# Patient Record
Sex: Male | Born: 2000 | Race: Black or African American | Hispanic: No | Marital: Single | Smoking: Never smoker
Health system: Southern US, Community
[De-identification: ages and names within clinical notes are randomized; demographics above are authoritative.]

## PROBLEM LIST (undated history)

## (undated) DIAGNOSIS — D5700 Hb-SS disease with crisis, unspecified: Secondary | ICD-10-CM

## (undated) DIAGNOSIS — J45909 Unspecified asthma, uncomplicated: Secondary | ICD-10-CM

## (undated) DIAGNOSIS — M87052 Idiopathic aseptic necrosis of left femur: Secondary | ICD-10-CM

## (undated) DIAGNOSIS — D571 Sickle-cell disease without crisis: Secondary | ICD-10-CM

## (undated) DIAGNOSIS — D5701 Hb-SS disease with acute chest syndrome: Secondary | ICD-10-CM

## (undated) HISTORY — DX: Hb-SS disease with crisis, unspecified: D57.00

## (undated) HISTORY — PX: UMBILICAL HERNIA REPAIR: SHX196

## (undated) HISTORY — PX: SPLENECTOMY: SUR1306

## (undated) HISTORY — PX: TONSILLECTOMY AND ADENOIDECTOMY: SHX28

## (undated) HISTORY — PX: ADENOIDECTOMY: SUR15

## (undated) HISTORY — PX: TONSILLECTOMY: SUR1361

---
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
## 2018-02-05 ENCOUNTER — Encounter: Payer: Self-pay | Admitting: Family Medicine

## 2018-02-05 ENCOUNTER — Ambulatory Visit (INDEPENDENT_AMBULATORY_CARE_PROVIDER_SITE_OTHER): Payer: Medicaid Other | Admitting: Family Medicine

## 2018-02-05 VITALS — BP 132/74 | HR 83 | Temp 98.9°F | Resp 16 | Ht 66.0 in | Wt 144.0 lb

## 2018-02-05 DIAGNOSIS — D571 Sickle-cell disease without crisis: Secondary | ICD-10-CM

## 2018-02-05 LAB — POCT URINALYSIS DIPSTICK
BILIRUBIN UA: NEGATIVE
GLUCOSE UA: NEGATIVE
Ketones, UA: NEGATIVE
Leukocytes, UA: NEGATIVE
Nitrite, UA: NEGATIVE
Protein, UA: NEGATIVE
RBC UA: NEGATIVE
SPEC GRAV UA: 1.02 (ref 1.010–1.025)
Urobilinogen, UA: 1 E.U./dL
pH, UA: 6.5 (ref 5.0–8.0)

## 2018-02-05 MED ORDER — PENICILLIN V POTASSIUM 250 MG PO TABS: 250.0000 mg | ORAL_TABLET | Freq: Two times a day (BID) | ORAL | 0 refills | Status: DC

## 2018-02-05 NOTE — Addendum Note (Signed)
Addended by: Loney HeringBATTEN, Kaiyah Eber E on: 02/05/2018 02:23 PM   Modules accepted: Orders

## 2018-02-05 NOTE — Progress Notes (Signed)
Chief Complaint  Patient presents with  . Sickle Cell Anemia     Subjective:    Patient ID: Jonathan [name], male    DOB: 10/31/2000, 17 y.o.   MRN: [id number]  HPI Jonathan [name], a very pleasant 17 year old male with a history of sickle cell anemia, HbSS presents for a follow up.    Patient reports infrequent sickle cell crisis. He is followed by Dr. Jacinta Shoe, [facility]. Patient consistently takes hydoxyurea 3244 mg daily, folic acid, and penicillin.  Patient says that he primarily uses Ibuprofen for pain associated with sickle cell anemia. However, he takes Percocet 5-325 mg occasionally for moderate to severe pain.  He denies headache, pain, fatigue, chest pain, heart palpitations, nausea, vomiting, diarrhea, or dysuria.   Past Medical History:  Diagnosis Date  . Acute chest syndrome(517.3) 2013   march 2013, Sept 2013  . Asthma   . Avascular necrosis of bone of left hip ([place])   . Hb-SS disease with vaso-occlusive crisis ([place]) 07/28/2011  . Sickle cell disease with crisis ([place])   . Sickle cell disease, type SS ([place])   . Sickle cell pain crisis ([place]) 10/02/2012  . Vaso-occlusive sickle cell crisis ([place]) 11/09/2011   Social History   Socioeconomic History  . Marital status: Single    Spouse name: Not on file  . Number of children: Not on file  . Years of education: Not on file  . Highest education level: Not on file  Occupational History  . Not on file  Social Needs  . Financial resource strain: Not on file  . Food insecurity:    Worry: Not on file    Inability: Not on file  . Transportation needs:    Medical: Not on file    Non-medical: Not on file  Tobacco Use  . Smoking status: Never Smoker  . Smokeless tobacco: Never Used  Substance and Sexual Activity  . Alcohol use: No    Alcohol/week: 0.0 oz  . Drug use: No  . Sexual activity: Never  Lifestyle  . Physical activity:    Days per week: Not on file    Minutes per session: Not on file  . Stress: Not on  file  Relationships  . Social connections:    Talks on phone: Not on file    Gets together: Not on file    Attends religious service: Not on file    Active member of club or organization: Not on file    Attends meetings of clubs or organizations: Not on file    Relationship status: Not on file  . Intimate partner violence:    Fear of current or ex partner: Not on file    Emotionally abused: Not on file    Physically abused: Not on file    Forced sexual activity: Not on file  Other Topics Concern  . Not on file  Social History Narrative   Lives with mother.  No smokers at home      Update:       05/02/16 Lives with Mother only   Immunization History  Administered Date(s) Administered  . DTaP 09/02/1999, 10/29/1999, 01/01/2000, 02/02/2001, 07/25/2003  . H1N1 06/14/2008  . Hepatitis A 03/10/2006, 04/20/2007  . Hepatitis B November [birth year] 1998, 09/02/1999, 01/01/2000  . HiB (PRP-OMP) 09/02/1999, 10/29/1999, 01/01/2000, 02/02/2001  . IPV 09/02/1999, 10/29/1999, 07/14/2000, 07/25/2003  . Influenza Split 07/14/2000, 06/23/2001, 06/14/2003, 07/02/2004, 07/14/2007, 06/14/2008, 06/06/2009, 06/06/2010, 06/18/2011, 05/24/2012  . Influenza,inj,Quad PF,6+ Mos 08/06/2013, 05/18/2015, 06/17/2017  . MMR 07/14/2000,  07/25/2003  . Meningococcal Conjugate 05/04/2001, 02/11/2006, 08/28/2010, 03/25/2016  . Pneumococcal Conjugate-13 09/02/1999, 10/29/1999, 01/01/2000, 09/16/2000, 06/06/2009  . Pneumococcal Polysaccharide-23 04/21/2001, 10/23/2004, 09/24/2005, 07/15/2011  . Tdap 05/16/2011  . Varicella 07/14/2000, 03/10/2006   Past Surgical History:  Procedure Laterality Date  . ADENOIDECTOMY    . SPLENECTOMY    . TONSILLECTOMY    . TONSILLECTOMY AND ADENOIDECTOMY    . TOTAL HIP ARTHROPLASTY Left 03/07/2016  . UMBILICAL HERNIA REPAIR     No Known Allergies   Past Medical History:  Diagnosis Date  . Acute chest syndrome(517.3) 2013   march 2013, Sept 2013  . Asthma   . Avascular necrosis of bone  of left hip ([place])   . Hb-SS disease with vaso-occlusive crisis ([place]) 07/28/2011  . Sickle cell disease with crisis ([place])   . Sickle cell disease, type SS ([place])   . Sickle cell pain crisis ([place]) 10/02/2012  . Vaso-occlusive sickle cell crisis ([place]) 11/09/2011   Social History   Socioeconomic History  . Marital status: Single    Spouse name: Not on file  . Number of children: Not on file  . Years of education: Not on file  . Highest education level: Not on file  Occupational History  . Not on file  Social Needs  . Financial resource strain: Not on file  . Food insecurity:    Worry: Not on file    Inability: Not on file  . Transportation needs:    Medical: Not on file    Non-medical: Not on file  Tobacco Use  . Smoking status: Never Smoker  . Smokeless tobacco: Never Used  Substance and Sexual Activity  . Alcohol use: No    Alcohol/week: 0.0 oz  . Drug use: No  . Sexual activity: Never  Lifestyle  . Physical activity:    Days per week: Not on file    Minutes per session: Not on file  . Stress: Not on file  Relationships  . Social connections:    Talks on phone: Not on file    Gets together: Not on file    Attends religious service: Not on file    Active member of club or organization: Not on file    Attends meetings of clubs or organizations: Not on file    Relationship status: Not on file  . Intimate partner violence:    Fear of current or ex partner: Not on file    Emotionally abused: Not on file    Physically abused: Not on file    Forced sexual activity: Not on file  Other Topics Concern  . Not on file  Social History Narrative   Lives with mother.  No smokers at home      Update:       05/02/16 Lives with Mother only   Review of Systems  Constitutional: Negative.  Negative for fatigue, fever and unexpected weight change.  HENT: Negative.   Eyes: Negative.   Cardiovascular: Negative.   Gastrointestinal: Negative.   Endocrine: Negative.  Negative for  polydipsia, polyphagia and polyuria.  Genitourinary: Negative.   Musculoskeletal: Positive for back pain.       Lower extremities  Skin: Negative.   Allergic/Immunologic: Negative.   Neurological: Negative.   Hematological: Negative.   Psychiatric/Behavioral: Negative.        Objective:   Physical Exam  Constitutional: He is oriented to person, place, and time. He appears well-developed and well-nourished.  HENT:  Head: Normocephalic and atraumatic.  Right Ear: External  ear normal.  Left Ear: External ear normal.  Nose: Nose normal.  Mouth/Throat: Oropharynx is clear and moist.  Eyes: Pupils are equal, round, and reactive to light.  Neck: Normal range of motion. Neck supple.  Cardiovascular: Normal rate, regular rhythm, normal heart sounds and intact distal pulses.  Pulmonary/Chest: Effort normal and breath sounds normal.  Abdominal: Soft. Bowel sounds are normal.  Musculoskeletal: Normal range of motion.  Neurological: He is alert and oriented to person, place, and time. He has normal reflexes.  Skin: Skin is warm and dry.  Psychiatric: He has a normal mood and affect. His behavior is normal. Judgment and thought content normal.      Assessment & Plan:  Sickle cell disease, type SS (HCC) Continue Hydrea at current dosage. We discussed the need for good hydration, monitoring of hydration status, avoidance of heat, cold, stress, and infection triggers. We discussed the risks and benefits of Hydrea, including bone marrow suppression, the possibility of GI upset, skin ulcers, hair thinning, and teratogenicity. The patient was reminded of the need to seek medical attention of any symptoms of bleeding, anemia, or infection. Continue folic acid 1 mg daily to prevent aplastic bone marrow crises.   Pulmonary evaluation - Patient denies severe recurrent wheezes, shortness of breath with exercise, or persistent cough. If these symptoms develop, pulmonary function tests with spirometry will  be ordered, and if abnormal, plan on referral to Pulmonology for further evaluation.  Cardiac - Routine screening for pulmonary hypertension is not recommended.  Eye - High risk of proliferative retinopathy. Annual eye exam with retinal exam recommended to patient. Eye exam up to date.   Immunization status - Vaccinations up to date  Acute and chronic painful episodes - Will continue Percocet 5-325 mg every 6 hours for moderate to severe pain. Pt is also aware that the prescription history is available to [place] online through the [facility] CSRS. Controlled substance agreement signed 12/10/2017. We reminded Jonathan [name] that all patients receiving Schedule II narcotics must be seen for follow within one month of prescription being requested. We reviewed the terms of our pain agreement, including the need to keep medicines in a safe locked location away from children or pets, and the need to report excess sedation or constipation, measures to avoid constipation, and policies related to early refills and stolen prescriptions. According to the [place] Chronic Pain Initiative program, we have reviewed details related to analgesia, adverse effects, aberrant behaviors. Reviewed [place] Substance Reporting system prior to prescribing opiate medications. No inconsistencies noted.  - penicillin v potassium (VEETID) 250 MG tablet; Take 1 tablet (250 mg total) by mouth 2 (two) times daily.  Dispense: 60 tablet; Refill: 0 - CBC with Differential - Comprehensive metabolic panel - Reticulocytes   RTC: 3 months for chronic conditions   [name]  MSN, FNP-C [facility] [address] [place], [place] [address] [phone]

## 2018-02-05 NOTE — Patient Instructions (Signed)
We discussed in detail that hydroxyurea has been found to be effective in reducing VOC, transfusions, and admissions for pain. It also reduces the duration of pain episodes by at least 50%. It depends on compliance. Compliance can be assessed by a rise in [facility] and drop in WBC/retic/ANC, and platelet count. It typically causes a rise in hemoglobin F, which can be assessed via hemoglobinopathy. It takes 3-6 months to see optimal effects of hydroxyurea therapy.    Discussed the importance of drinking 64 ounces of water daily. The Importance of Water. To help prevent pain crises, it is important to drink plenty of water throughout the day. This is because dehydration of red blood cells may lead to the sickling process.      Sickle Cell Anemia, Adult Sickle cell anemia is a condition where your red blood cells are shaped like sickles. Red blood cells carry oxygen through the body. Sickle-shaped red blood cells do not live as long as normal red blood cells. They also clump together and block blood from flowing through the blood vessels. These things prevent the body from getting enough oxygen. Sickle cell anemia causes organ damage and pain. It also increases the risk of infection. Follow these instructions at home:  Drink enough fluid to keep your pee (urine) clear or pale yellow. Drink more in hot weather and during exercise.  Do not smoke. Smoking lowers oxygen levels in the blood.  Only take over-the-counter or prescription medicines as told by your doctor.  Take antibiotic medicines as told by your doctor. Make sure you finish them even if you start to feel better.  Take supplements as told by your doctor.  Consider wearing a medical alert bracelet. This tells anyone caring for you in an emergency of your condition.  When traveling, keep your medical information, doctors' names, and the medicines you take with you at all times.  If you have a fever, do not take fever medicines right away. This  could cover up a problem. Tell your doctor.  Keep all follow-up visits with your doctor. Sickle cell anemia requires regular medical care. Contact a doctor if: You have a fever. Get help right away if:  You feel dizzy or faint.  You have new belly (abdominal) pain, especially on the left side near the stomach area.  You have a lasting, often uncomfortable and painful erection of the penis (priapism). If it is not treated right away, you will become unable to have sex (impotence).  You have numbness in your arms or legs or you have a hard time moving them.  You have a hard time talking.  You have a fever or lasting symptoms for more than 2-3 days.  You have a fever and your symptoms suddenly get worse.  You have signs or symptoms of infection. These include: ? Chills. ? Being more tired than normal (lethargy). ? Irritability. ? Poor eating. ? Throwing up (vomiting).  You have pain that is not helped with medicine.  You have shortness of breath.  You have pain in your chest.  You are coughing up pus-like or bloody mucus.  You have a stiff neck.  Your feet or hands swell or have pain.  Your belly looks bloated.  Your joints hurt. This information is not intended to replace advice given to you by your health care provider. Make sure you discuss any questions you have with your health care provider. Document Released: 06/01/2013 Document Revised: 01/17/2016 Document Reviewed: 03/23/2013 Elsevier Interactive Patient Education  2017 Elsevier Inc.  

## 2018-02-06 LAB — COMPREHENSIVE METABOLIC PANEL
A/G RATIO: 2 (ref 1.2–2.2)
ALBUMIN: 4.6 g/dL (ref 3.5–5.5)
ALT: 7 IU/L (ref 0–44)
AST: 15 IU/L (ref 0–40)
Alkaline Phosphatase: 124 IU/L (ref 56–127)
BILIRUBIN TOTAL: 0.5 mg/dL (ref 0.0–1.2)
BUN / CREAT RATIO: 8 — AB (ref 9–20)
BUN: 6 mg/dL (ref 6–20)
CHLORIDE: 106 mmol/L (ref 96–106)
CO2: 22 mmol/L (ref 20–29)
Calcium: 9.3 mg/dL (ref 8.7–10.2)
Creatinine, Ser: 0.71 mg/dL — ABNORMAL LOW (ref 0.76–1.27)
GFR calc Af Amer: 158 mL/min/{1.73_m2} (ref >59)
GFR calc non Af Amer: 137 mL/min/{1.73_m2} (ref >59)
GLOBULIN, TOTAL: 2.3 g/dL (ref 1.5–4.5)
Glucose: 100 mg/dL — ABNORMAL HIGH (ref 65–99)
POTASSIUM: 4.1 mmol/L (ref 3.5–5.2)
SODIUM: 141 mmol/L (ref 134–144)
Total Protein: 6.9 g/dL (ref 6.0–8.5)

## 2018-02-06 LAB — CBC WITH DIFFERENTIAL/PLATELET
BASOS: 1 %
Basophils Absolute: 0.1 10*3/uL (ref 0.0–0.2)
EOS (ABSOLUTE): 0.1 10*3/uL (ref 0.0–0.4)
EOS: 1 %
HEMATOCRIT: 30.4 % — AB (ref 37.5–51.0)
HEMOGLOBIN: 9.9 g/dL — AB (ref 13.0–17.7)
IMMATURE GRANULOCYTES: 0 %
Immature Grans (Abs): 0 10*3/uL (ref 0.0–0.1)
LYMPHS ABS: 3 10*3/uL (ref 0.7–3.1)
Lymphs: 70 %
MCH: 36.9 pg — AB (ref 26.6–33.0)
MCHC: 32.6 g/dL (ref 31.5–35.7)
MCV: 113 fL — ABNORMAL HIGH (ref 79–97)
MONOS ABS: 0.4 10*3/uL (ref 0.1–0.9)
Monocytes: 10 %
NEUTROS PCT: 18 %
Neutrophils Absolute: 0.8 10*3/uL — ABNORMAL LOW (ref 1.4–7.0)
Platelets: 310 10*3/uL (ref 150–450)
RBC: 2.68 x10E6/uL — CL (ref 4.14–5.80)
RDW: 25.1 % — AB (ref 12.3–15.4)
WBC: 4.3 10*3/uL (ref 3.4–10.8)

## 2018-02-06 LAB — RETICULOCYTES: RETIC CT PCT: 3.6 % — AB (ref 0.6–2.6)

## 2018-02-08 ENCOUNTER — Telehealth: Payer: Self-pay

## 2018-02-08 NOTE — Telephone Encounter (Signed)
-----   Message from Massie MaroonLachina M Hollis, [place] sent at 02/08/2018  5:35 AM EDT ----- Regarding: lab results Please inform patient that all labs are consistent with baseline. Advise patient to follow up in office as scheduled '  Lachina Rennis PettyMoore Hollis  MSN, FNP-C Patient Care [facility] [address]  [place], [place] [address] [phone]

## 2018-02-08 NOTE — Telephone Encounter (Signed)
Called, no answer. Left a message advising that all labs are consistent with baseline and to keep next scheduled follow up in office. Asked if any questions to call back to our office. Thanks!

## 2018-06-18 ENCOUNTER — Telehealth: Payer: Self-pay

## 2018-06-24 ENCOUNTER — Other Ambulatory Visit: Payer: Self-pay | Admitting: Internal Medicine

## 2018-06-24 DIAGNOSIS — Z79891 Long term (current) use of opiate analgesic: Secondary | ICD-10-CM

## 2018-06-24 MED ORDER — OXYCODONE-ACETAMINOPHEN 5-325 MG PO TABS: 1.0000 | ORAL_TABLET | Freq: Four times a day (QID) | ORAL | 0 refills | Status: DC | PRN

## 2018-06-24 NOTE — Telephone Encounter (Signed)
Prescription sent to the pharmacy.

## 2018-07-28 ENCOUNTER — Ambulatory Visit (INDEPENDENT_AMBULATORY_CARE_PROVIDER_SITE_OTHER): Payer: Self-pay | Admitting: Family Medicine

## 2018-07-28 ENCOUNTER — Encounter: Payer: Self-pay | Admitting: Family Medicine

## 2018-07-28 VITALS — BP 135/77 | HR 64 | Temp 98.1°F | Resp 14 | Ht 66.0 in | Wt 154.0 lb

## 2018-07-28 DIAGNOSIS — J029 Acute pharyngitis, unspecified: Secondary | ICD-10-CM

## 2018-07-28 DIAGNOSIS — Z79891 Long term (current) use of opiate analgesic: Secondary | ICD-10-CM

## 2018-07-28 LAB — POCT RAPID STREP A (OFFICE): Rapid Strep A Screen: NEGATIVE

## 2018-07-28 MED ORDER — LIDOCAINE VISCOUS HCL 2 % MT SOLN: 15.0000 mL | OROMUCOSAL | 0 refills | Status: DC | PRN

## 2018-07-28 MED ORDER — METHYLPREDNISOLONE SODIUM SUCC 125 MG IJ SOLR
125.0000 mg | Freq: Once | INTRAMUSCULAR | Status: AC
  Administered 2018-07-28: 125 mg via INTRAMUSCULAR

## 2018-07-28 MED ORDER — OXYCODONE-ACETAMINOPHEN 5-325 MG PO TABS: 1.0000 | ORAL_TABLET | Freq: Four times a day (QID) | ORAL | 0 refills | Status: AC | PRN

## 2018-07-28 NOTE — Patient Instructions (Addendum)
I sent a liquid medication to the pharmacy to help with your sore throat. Gargle and swallow as the directions state on the bottle.  Warm salt water gargles will help too.    Pharyngitis Pharyngitis is a sore throat (pharynx). There is redness, pain, and swelling of your throat. Follow these instructions at home:  Drink enough fluids to keep your pee (urine) clear or pale yellow.  Only take medicine as told by your doctor. ? You may get sick again if you do not take medicine as told. Finish your medicines, even if you start to feel better. ? Do not take aspirin.  Rest.  Rinse your mouth (gargle) with salt water ( tsp of salt per 1 qt of water) every 1-2 hours. This will help the pain.  If you are not at risk for choking, you can suck on hard candy or sore throat lozenges. Contact a doctor if:  You have large, tender lumps on your neck.  You have a rash.  You cough up green, yellow-brown, or bloody spit. Get help right away if:  You have a stiff neck.  You drool or cannot swallow liquids.  You throw up (vomit) or are not able to keep medicine or liquids down.  You have very bad pain that does not go away with medicine.  You have problems breathing (not from a stuffy nose). This information is not intended to replace advice given to you by your health care provider. Make sure you discuss any questions you have with your health care provider. Document Released: 01/28/2008 Document Revised: 01/17/2016 Document Reviewed: 04/18/2013 Elsevier Interactive Patient Education  2017 [employer]

## 2018-07-28 NOTE — Progress Notes (Signed)
  Patient Care Center Internal Medicine and Sickle Cell Care   Progress Note: Sick Visit Provider: Mike GipAndre Antoine Vandermeulen, FNP  SUBJECTIVE:   Jonathan [name] is a 17 y.o. male who  has a past medical history of Acute chest syndrome(517.3) (2013), Asthma, Avascular necrosis of bone of left hip (HCC), Hb-SS disease with vaso-occlusive crisis (HCC) (07/28/2011), Sickle cell disease with crisis (HCC), Sickle cell disease, type SS (HCC), Sickle cell pain crisis (HCC) (10/02/2012), and Vaso-occlusive sickle cell crisis (HCC) (11/09/2011).. Patient presents today for Sore Throat and Headache  . ROS   OBJECTIVE: BP 135/77 (BP Location: Left Arm, Patient Position: Sitting, Cuff Size: Normal)   Pulse 64   Temp 98.1 F (36.7 C) (Oral)   Resp 14   Ht 5\' 6"  (1.676 m)   Wt 154 lb (69.9 kg)   SpO2 100%   BMI 24.86 kg/m   Wt Readings from Last 3 Encounters:  07/28/18 154 lb (69.9 kg) (52 %, Z= 0.05)*  02/05/18 144 lb (65.3 kg) (38 %, Z= -0.29)*  12/10/17 141 lb (64 kg) (34 %, Z= -0.40)*   * Growth percentiles are based on CDC (Boys, 2-20 Years) data.     Physical Exam  ASSESSMENT/PLAN:  Pharyngitis, unspecified etiology Rapid strep negative. Mono pending. Will treat symptoms.  - Mono (Epstein Barr Virus) - methylPREDNISolone sodium succinate (SOLU-MEDROL) 125 mg/2 mL injection 125 mg - lidocaine (XYLOCAINE) 2 % solution; Use as directed 15 mLs in the mouth or throat every 4 (four) hours as needed (sore throat). Gargle and swallow  Dispense: 100 mL; Refill: 0 - CBC with Differential - Comprehensive metabolic panel - Mononucleosis screen  3. Chronic prescription opiate use - oxyCODONE-acetaminophen (PERCOCET) 5-325 MG tablet; Take 1 tablet by mouth every 6 (six) hours as needed for up to 15 days for severe pain.  Dispense: 60 tablet; Refill: 0        The patient was given clear instructions to go to ER or return to medical center if symptoms do not improve, worsen or new problems develop. The  patient verbalized understanding and agreed with plan of care.   Ms. Freda Jacksonndr L. Riley Lamouglas, FNP-BC Patient Care Center [facility] [address]  [place], [place] [address] [phone]     This note has been created with Dragon speech recognition software and smart phrase technology. Any transcriptional errors are unintentional.

## 2018-07-29 LAB — CBC WITH DIFFERENTIAL/PLATELET
Basophils Absolute: 0.1 10*3/uL (ref 0.0–0.2)
Basos: 1 %
EOS (ABSOLUTE): 0.1 10*3/uL (ref 0.0–0.4)
Eos: 2 %
Hematocrit: 34.8 % — ABNORMAL LOW (ref 37.5–51.0)
Hemoglobin: 11.8 g/dL — ABNORMAL LOW (ref 13.0–17.7)
Immature Grans (Abs): 0 10*3/uL (ref 0.0–0.1)
Immature Granulocytes: 0 %
Lymphocytes Absolute: 2.8 10*3/uL (ref 0.7–3.1)
Lymphs: 54 %
MCH: 36.5 pg — ABNORMAL HIGH (ref 26.6–33.0)
MCHC: 33.9 g/dL (ref 31.5–35.7)
MCV: 108 fL — ABNORMAL HIGH (ref 79–97)
Monocytes Absolute: 0.5 10*3/uL (ref 0.1–0.9)
Monocytes: 9 %
NRBC: 1 % — ABNORMAL HIGH (ref 0–0)
Neutrophils Absolute: 1.7 10*3/uL (ref 1.4–7.0)
Neutrophils: 34 %
Platelets: 316 10*3/uL (ref 150–450)
RBC: 3.23 x10E6/uL — ABNORMAL LOW (ref 4.14–5.80)
RDW: 14.9 % (ref 12.3–15.4)
WBC: 5.1 10*3/uL (ref 3.4–10.8)

## 2018-07-29 LAB — COMPREHENSIVE METABOLIC PANEL
ALT: 10 IU/L (ref 0–44)
AST: 15 IU/L (ref 0–40)
Albumin/Globulin Ratio: 1.7 (ref 1.2–2.2)
Albumin: 4.5 g/dL (ref 3.5–5.5)
Alkaline Phosphatase: 108 IU/L (ref 39–117)
BUN/Creatinine Ratio: 6 — ABNORMAL LOW (ref 9–20)
BUN: 5 mg/dL — ABNORMAL LOW (ref 6–20)
Bilirubin Total: 0.7 mg/dL (ref 0.0–1.2)
CO2: 22 mmol/L (ref 20–29)
Calcium: 9.6 mg/dL (ref 8.7–10.2)
Chloride: 101 mmol/L (ref 96–106)
Creatinine, Ser: 0.79 mg/dL (ref 0.76–1.27)
GFR calc Af Amer: 150 mL/min/{1.73_m2} (ref >59)
GFR calc non Af Amer: 130 mL/min/{1.73_m2} (ref >59)
Globulin, Total: 2.7 g/dL (ref 1.5–4.5)
Glucose: 92 mg/dL (ref 65–99)
Potassium: 4.4 mmol/L (ref 3.5–5.2)
Sodium: 140 mmol/L (ref 134–144)
Total Protein: 7.2 g/dL (ref 6.0–8.5)

## 2018-07-30 ENCOUNTER — Telehealth: Payer: Self-pay

## 2018-07-30 NOTE — Telephone Encounter (Signed)
Called, no answer. No voicemail picked up. We are still waiting on results for mono. Thanks!

## 2018-08-09 ENCOUNTER — Ambulatory Visit (INDEPENDENT_AMBULATORY_CARE_PROVIDER_SITE_OTHER): Payer: Self-pay | Admitting: Family Medicine

## 2018-08-09 ENCOUNTER — Encounter: Payer: Self-pay | Admitting: Family Medicine

## 2018-08-09 VITALS — BP 133/78 | HR 84 | Temp 98.3°F | Resp 16 | Ht 66.0 in | Wt 153.0 lb

## 2018-08-09 DIAGNOSIS — D571 Sickle-cell disease without crisis: Secondary | ICD-10-CM

## 2018-08-09 LAB — POCT URINALYSIS DIPSTICK
Bilirubin, UA: NEGATIVE
Blood, UA: NEGATIVE
Glucose, UA: NEGATIVE
Ketones, UA: NEGATIVE
Leukocytes, UA: NEGATIVE
Nitrite, UA: NEGATIVE
Protein, UA: POSITIVE — AB
Spec Grav, UA: 1.015 (ref 1.010–1.025)
Urobilinogen, UA: 0.2 E.U./dL
pH, UA: 5.5 (ref 5.0–8.0)

## 2018-08-09 NOTE — Progress Notes (Signed)
PATIENT CARE CENTER INTERNAL MEDICINE AND SICKLE CELL CARE  SICKLE CELL ANEMIA FOLLOW UP VISIT PROVIDER: Mike Gip, FNP    Subjective:   Jonathan [name]  is a 17 y.o.  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 presents for a follow up for Sickle Cell Anemia. The patient has had 0 admissions in the past 6 months.  Pain regimen includes: Ibuprofen and percocet  Hydrea Therapy: Yes Medication compliance: Yes  Pain today is 0/10.  Patient reports drinking 60 ounces of fluid per day.  Patient doing well today. Pharyngitis has resolved. No complaints.  Followed by hematology.   Review of Systems  Constitutional: Negative.   HENT: Negative.   Eyes: Negative.   Respiratory: Negative.   Cardiovascular: Negative.   Gastrointestinal: Negative.   Genitourinary: Negative.   Musculoskeletal: Negative.   Skin: Negative.   Neurological: Negative.   Psychiatric/Behavioral: Negative.     Objective:   Objective  BP 133/78 (BP Location: Left Arm, Patient Position: Sitting, Cuff Size: Normal)   Pulse 84   Temp 98.3 F (36.8 C) (Oral)   Resp 16   Ht 5\' 6"  (1.676 m)   Wt 153 lb (69.4 kg)   SpO2 98%   BMI 24.69 kg/m   Wt Readings from Last 3 Encounters:  08/09/18 153 lb (69.4 kg) (50 %, Z= 0.01)*  07/28/18 154 lb (69.9 kg) (52 %, Z= 0.05)*  02/05/18 144 lb (65.3 kg) (38 %, Z= -0.29)*   * Growth percentiles are based on CDC (Boys, 2-20 Years) data.     Physical Exam Vitals signs and nursing note reviewed.  Constitutional:      General: He is not in acute distress.    Appearance: He is well-developed.  HENT:     Head: Normocephalic and atraumatic.  Eyes:   Conjunctiva/sclera: Conjunctivae normal.     Pupils: Pupils are equal, round, and reactive to light.  Neck:     Musculoskeletal: Normal range of motion.  Cardiovascular:     Rate and Rhythm: Normal rate and regular rhythm.     Heart sounds: Normal heart sounds.  Pulmonary:     Effort: Pulmonary effort is normal. No respiratory distress.     Breath sounds: Normal breath sounds.  Abdominal:     General: Bowel sounds are normal. There is no distension.     Palpations: Abdomen is soft.  Musculoskeletal: Normal range of motion.  Skin:    General: Skin is warm and dry.  Neurological:     Mental Status: He is alert and oriented to person, place, and time.  Psychiatric:        Behavior: Behavior normal.        Thought Content: Thought content normal.      Assessment/Plan:   Assessment   Encounter Diagnosis  Name Primary?  . Sickle cell disease, type SS (HCC) Yes     Plan  1. Sickle cell disease, type SS (HCC) Continue with current medications.  - Urinalysis Dipstick   Return to care as scheduled and prn. Patient verbalized understanding and agreed with plan of care.   1. Sickle cell disease - Continue Hydrea   We discussed the need for good hydration, monitoring of hydration status, avoidance of  heat, cold, stress, and infection triggers. We discussed the risks and benefits of Hydrea, including bone marrow suppression, the possibility of GI upset, skin ulcers, hair thinning, and teratogenicity. The patient was reminded of the need to seek medical attention of any symptoms of bleeding, anemia, or infection. Continue folic acid 1 mg daily to prevent aplastic bone marrow crises.   2. Pulmonary evaluation - Patient denies severe recurrent wheezes, shortness of breath with exercise, or persistent cough. If these symptoms develop, pulmonary function tests with spirometry will be ordered, and if abnormal, plan on referral to Pulmonology for further evaluation.  3. Cardiac - Routine  screening for pulmonary hypertension is not recommended.  4. Eye - High risk of proliferative retinopathy. Annual eye exam with retinal exam recommended to patient.  5. Immunization status -  Yearly influenza vaccination is recommended, as well as being up to date with Meningococcal and Pneumococcal vaccines.   6. Acute and chronic painful episodes - We discussed that pt is to receive Schedule II prescriptions only from us. Pt is also aware that the prescription history is available to us online through the [facility] CS[facility]. Controlled substance agreement signed. We reminded Jonathan [name] that all patients receiving Schedule II narcotics must be seen for follow within one month of prescription being requested. We reviewed the terms of our pain agreement, including the need to keep medicines in a safe locked location away from children or pets, and the need to report excess sedation or constipation, measures to avoid constipation, and policies related to early refills and stolen prescriptions. According to the [place] [facility] program, we have reviewed details related to analgesia, adverse effects, aberrant behaviors.  7. Iron overload from chronic transfusion.  Not applicable at this time.  If this occurs will use Exjade for management.   8. Vitamin D deficiency - Drisdol 50,000 units weekly. Patient encouraged to take as prescribed.   The above recommendations are taken from the NIH Evidence-Based Management of Sickle Cell Disease: Expert Panel Report, [id number].   [name], FNP-BC Patient [facility] [address]  [place], [place] [address] [phone]  This note has been created with Dragon speech recognition software and smart phrase technology. Any transcriptional errors are unintentional.

## 2018-08-09 NOTE — Patient Instructions (Signed)
Sickle Cell Anemia, Adult °Sickle cell anemia is a condition where your red blood cells are shaped like sickles. Red blood cells carry oxygen through the body. Sickle-shaped red blood cells do not live as long as normal red blood cells. They also clump together and block blood from flowing through the blood vessels. These things prevent the body from getting enough oxygen. Sickle cell anemia causes organ damage and pain. It also increases the risk of infection. °Follow these instructions at home: °· Drink enough fluid to keep your pee (urine) clear or pale yellow. Drink more in hot weather and during exercise. °· Do not smoke. Smoking lowers oxygen levels in the blood. °· Only take over-the-counter or prescription medicines as told by your doctor. °· Take antibiotic medicines as told by your doctor. Make sure you finish them even if you start to feel better. °· Take supplements as told by your doctor. °· Consider wearing a medical alert bracelet. This tells anyone caring for you in an emergency of your condition. °· When traveling, keep your medical information, doctors' names, and the medicines you take with you at all times. °· If you have a fever, do not take fever medicines right away. This could cover up a problem. Tell your doctor. °· Keep all follow-up visits with your doctor. Sickle cell anemia requires regular medical care. °Contact a doctor if: °You have a fever. °Get help right away if: °· You feel dizzy or faint. °· You have new belly (abdominal) pain, especially on the left side near the stomach area. °· You have a lasting, often uncomfortable and painful erection of the penis (priapism). If it is not treated right away, you will become unable to have sex (impotence). °· You have numbness in your arms or legs or you have a hard time moving them. °· You have a hard time talking. °· You have a fever or lasting symptoms for more than 2-3 days. °· You have a fever and your symptoms suddenly get  worse. °· You have signs or symptoms of infection. These include: °? Chills. °? Being more tired than normal (lethargy). °? Irritability. °? Poor eating. °? Throwing up (vomiting). °· You have pain that is not helped with medicine. °· You have shortness of breath. °· You have pain in your chest. °· You are coughing up pus-like or bloody mucus. °· You have a stiff neck. °· Your feet or hands swell or have pain. °· Your belly looks bloated. °· Your joints hurt. °This information is not intended to replace advice given to you by your health care provider. Make sure you discuss any questions you have with your health care provider. °Document Released: 06/01/2013 Document Revised: 01/17/2016 Document Reviewed: 03/23/2013 °Elsevier Interactive Patient Education © 2017 Elsevier Inc. ° °

## 2018-08-22 IMAGING — MR MR ORBITS W/O CM
9 of 12 series · 26 of 48 positions shown · IV contrast (agent unspecified)
Comparison: None.

CLINICAL DATA: Orbital pain and swelling. Severe clavicle pain.
Sickle cell crisis with known avascular necrosis, hypertension.

EXAM:
MRI HEAD AND ORBITS WITHOUT CONTRAST
TECHNIQUE: Multiplanar, multiecho pulse sequences of the brain and surrounding
structures were obtained without intravenous contrast. Multiplanar,
multiecho pulse sequences of the orbits and surrounding structures
were obtained including fat saturation techniques, without
intravenous contrast administration. Patient was unable to complete
examination and postcontrast imaging was not obtained.

[Series 2: FLAIR · sagittal · 5.0mm · 0.47mm/px · 3 of 23 slices shown (1 of 2)]
[im 1/23]
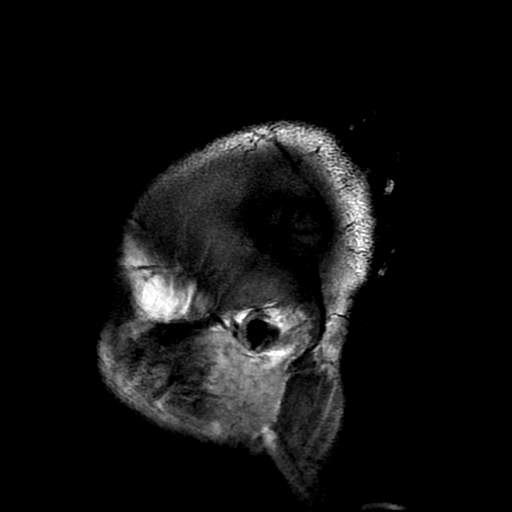
[im 12/23]
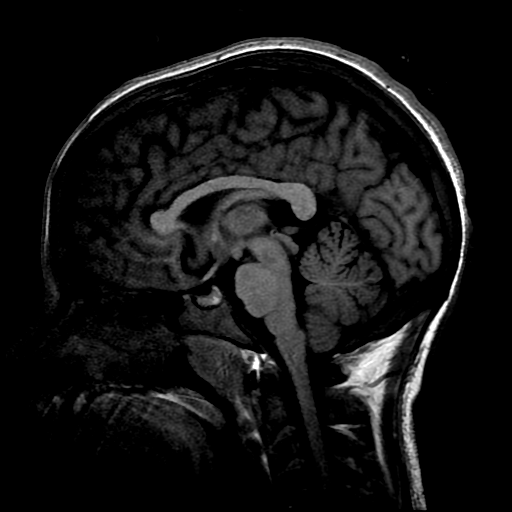
[im 23/23]
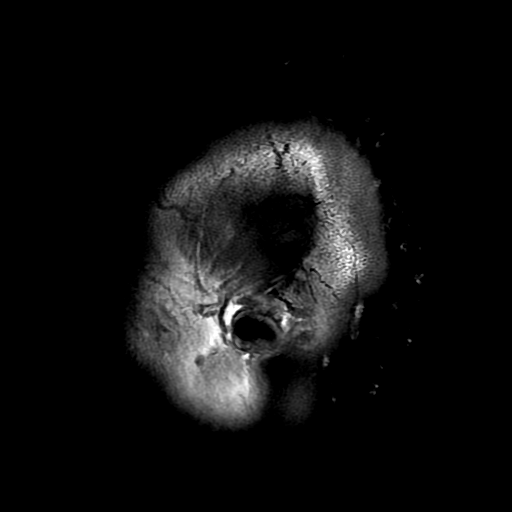

[Series 4: DWI · axial · 3.0mm · 0.94mm/px · z∈[-35,+108]mm · 8 of 99 slices shown]
[im 1/99]
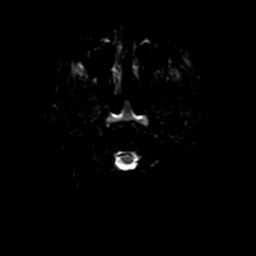
[im 15/99]
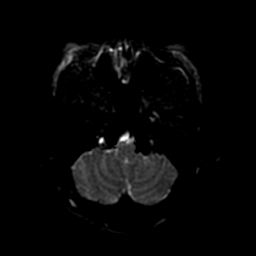
[im 29/99]
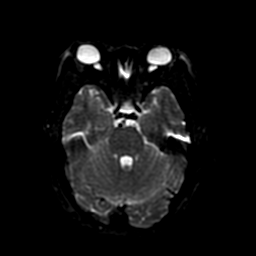
[im 43/99]
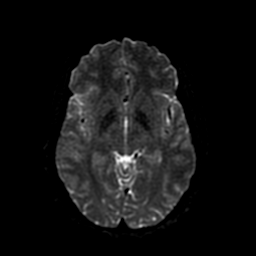
[im 57/99]
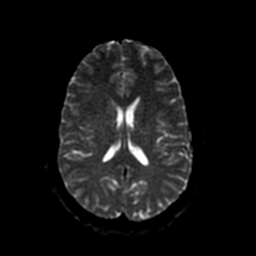
[im 71/99]
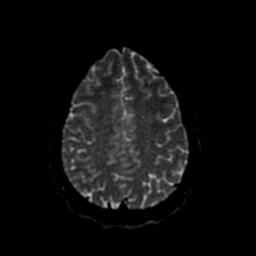
[im 85/99]
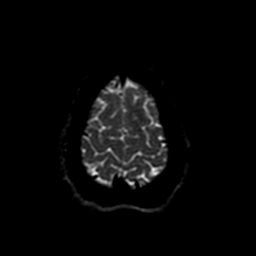
[im 99/99]
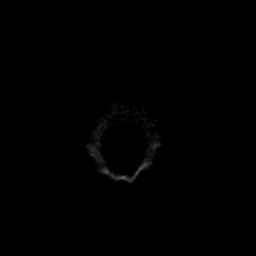

[Series 6: T2 · axial · 5.0mm · 0.47mm/px · z∈[-52,+109]mm · 2 of 29 slices shown]
[im 1/29]
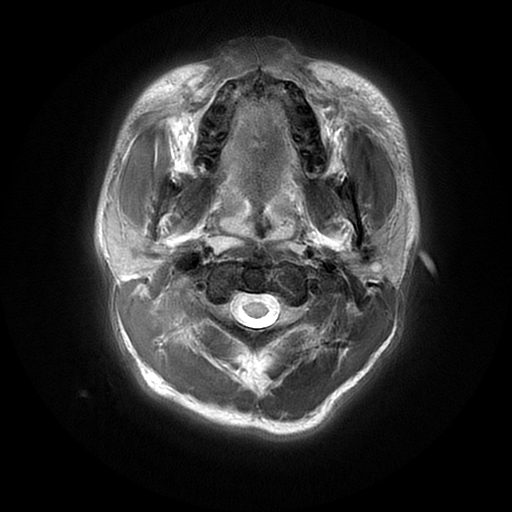
[im 29/29]
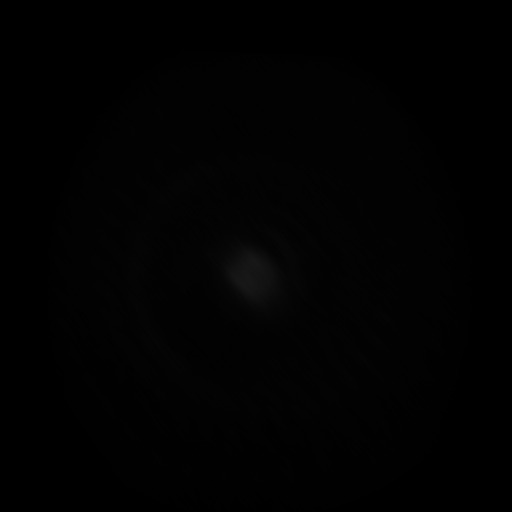

[Series 7: FLAIR · axial · 5.0mm · 0.47mm/px · z∈[-53,+109]mm · 2 of 27 slices shown (2 of 2)]
[im 1/27]
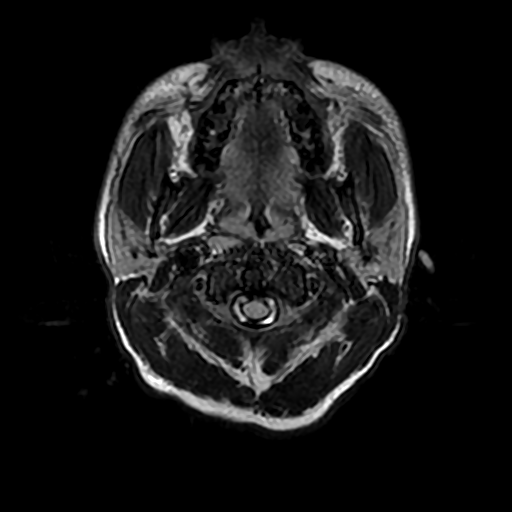
[im 27/27]
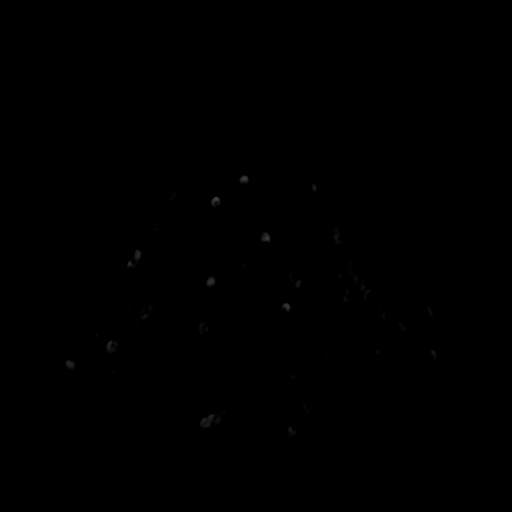

[Series 10: T2 fat-sat · coronal · 4.0mm · 0.35mm/px · 2 of 22 slices shown (1 of 2)]
[im 1/22]
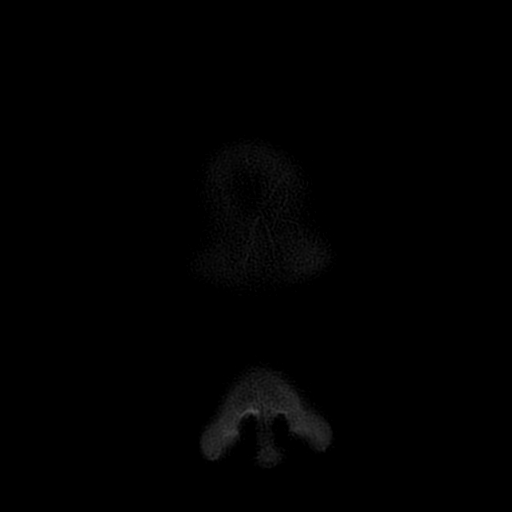
[im 22/22]
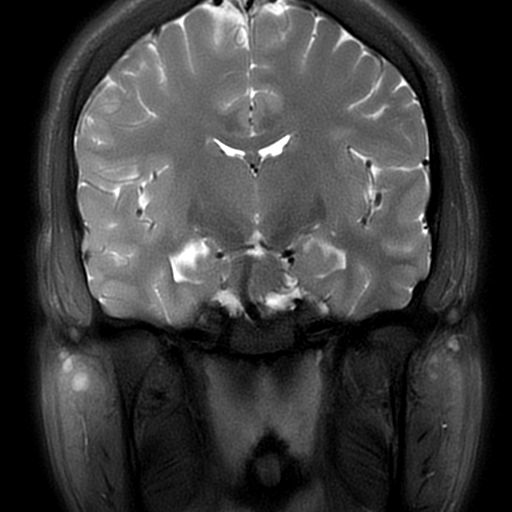

[Series 11: T2 fat-sat · axial · 3.0mm · 0.35mm/px · z∈[-24,+33]mm · 2 of 20 slices shown (2 of 2)]
[im 1/20]
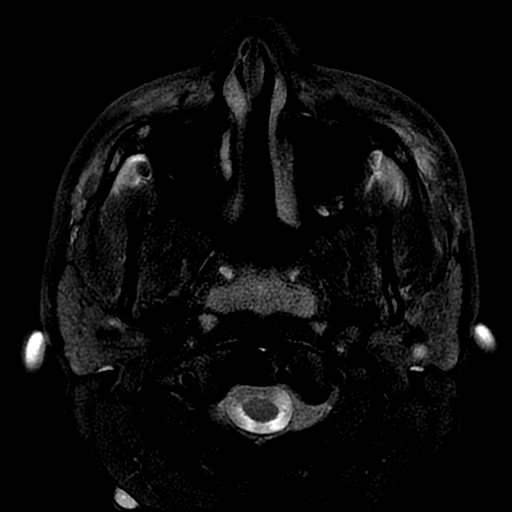
[im 20/20]
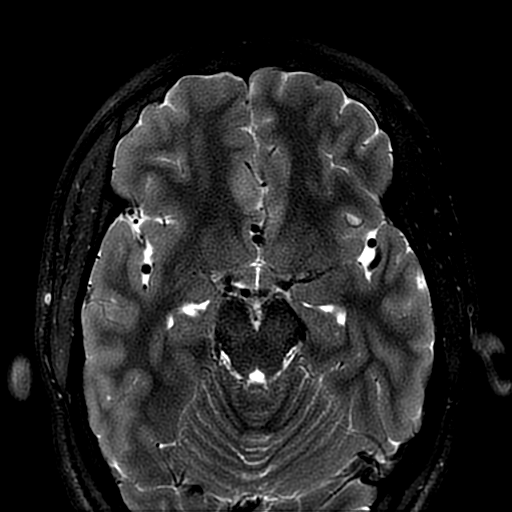

[Series 12: T1 · coronal · 4.0mm · 0.35mm/px · 2 of 22 slices shown (1 of 2)]
[im 1/22]
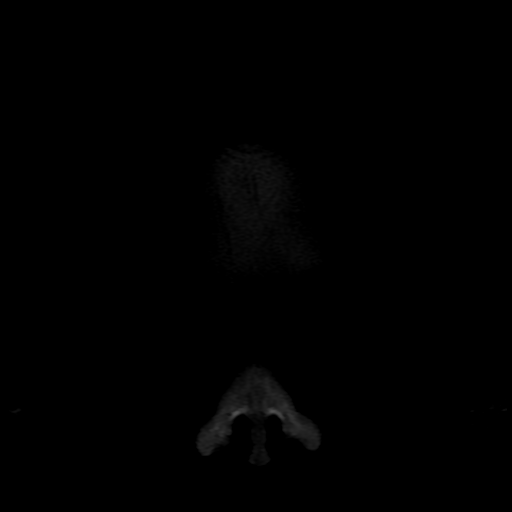
[im 22/22]
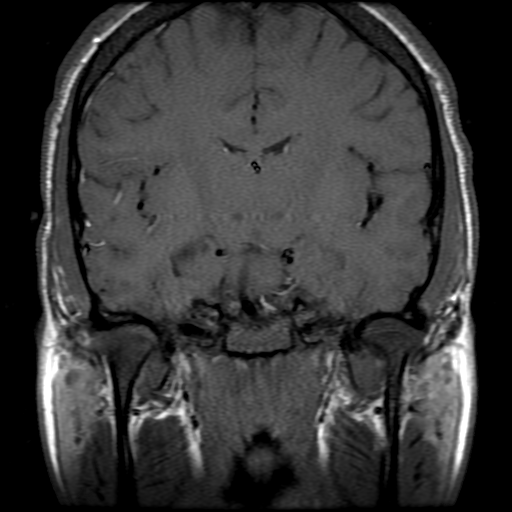

[Series 13: T1 · axial · 3.0mm · 0.35mm/px · 1 of 20 slices shown (2 of 2)]
[im 1/20]
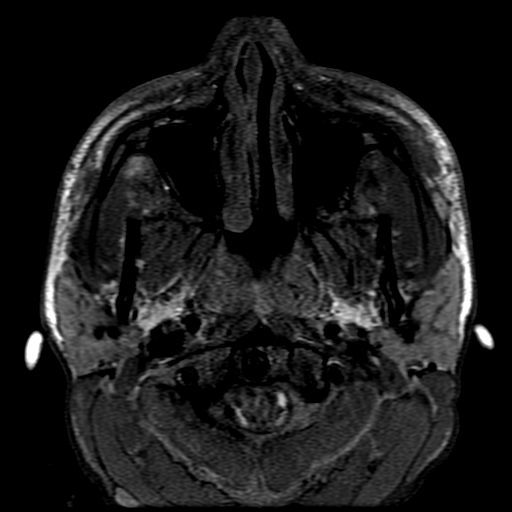

[Series 450: ADC · axial · 3.0mm · 0.94mm/px · z∈[-35,+108]mm · 4 of 50 slices shown]
[im 1/50]
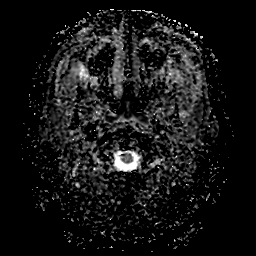
[im 17/50]
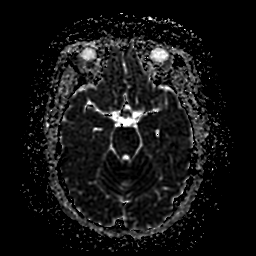
[im 33/50]
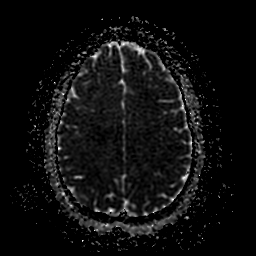
[im 50/50]
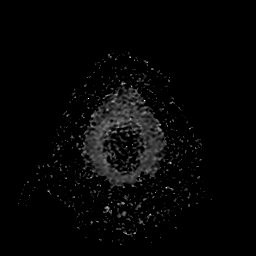

[26 of 48 positions shown; findings below may reference images not displayed]

FINDINGS: MRI HEAD FINDINGS

BRAIN: No reduced diffusion to suggest acute ischemia. No
susceptibility artifact to suggest hemorrhage. The ventricles and
sulci are normal for patient's age. LEFT frontal developmental
venous anomaly, benign finding. Subcentimeter faint susceptibility
artifact RIGHT caudate head with minimal FLAIR T2 hyperintense
signal. No suspicious parenchymal signal, masses or mass effect. No
abnormal extra-axial fluid collections.

VASCULAR: Normal major intracranial vascular flow voids present at
skull base.

SKULL AND UPPER CERVICAL SPINE: No abnormal sellar expansion. No
suspicious calvarial bone marrow signal. Craniocervical junction
maintained.

OTHER: None.

MRI ORBITS FINDINGS

ORBITS: Ocular globes are intact with normal signal. No flattening
of the optic discs. Lenses are located. Preservation of the orbital
fat. T2 bright signal within the bilateral anterior optic nerves,
orbital segment. Patulous optic nerve sheath. Slightly increased T2
view while I with probable T2 shine through, limited assessment due
to slice thickness. Normal symmetric appearance of the extraocular
muscles. No intra-ocular mass, signal abnormality nor abnormal
enhancement. Superior ophthalmic veins are not enlarged.

VISUALIZED SINUSES: Well-aerated.

SOFT TISSUES: Normal.
IMPRESSION: MRI HEAD:  No acute intracranial process.

Chronic faint microhemorrhage RIGHT basal ganglia.

MRI ORBITS: Abnormal bilateral optic nerve orbital segment with
patulous optic nerve sheaths. Given patient's history of sickle
cell, vasculitic involvement and ischemic optic neuropathy is
suspected, less likely inflammatory etiology ; no corroborative
findings of intracranial hypertension. Recommend postcontrast
imaging, thin slice coronal DWI sequence and ophthalmalgic
consultation.

These results will be called to the ordering clinician or
representative by the Radiologist Assistant, and communication
documented in the [employer] Dashboard.

## 2018-11-02 ENCOUNTER — Emergency Department (HOSPITAL_COMMUNITY)
Admission: EM | Admit: 2018-11-02 | Discharge: 2018-11-02 | Disposition: A | Discharge status: Home / Self Care | Payer: Medicaid Other | Attending: Emergency Medicine | Admitting: Emergency Medicine

## 2018-11-02 ENCOUNTER — Other Ambulatory Visit: Payer: Self-pay

## 2018-11-02 ENCOUNTER — Encounter (HOSPITAL_COMMUNITY): Payer: Self-pay | Admitting: Emergency Medicine

## 2018-11-02 DIAGNOSIS — J45909 Unspecified asthma, uncomplicated: Secondary | ICD-10-CM | POA: Insufficient documentation

## 2018-11-02 DIAGNOSIS — I10 Essential (primary) hypertension: Secondary | ICD-10-CM | POA: Insufficient documentation

## 2018-11-02 DIAGNOSIS — Z96642 Presence of left artificial hip joint: Secondary | ICD-10-CM | POA: Insufficient documentation

## 2018-11-02 DIAGNOSIS — Z79899 Other long term (current) drug therapy: Secondary | ICD-10-CM | POA: Insufficient documentation

## 2018-11-02 DIAGNOSIS — D5700 Hb-SS disease with crisis, unspecified: Secondary | ICD-10-CM

## 2018-11-02 LAB — CBC WITH DIFFERENTIAL/PLATELET
Abs Immature Granulocytes: 0.03 10*3/uL (ref 0.00–0.07)
Basophils Absolute: 0.1 10*3/uL (ref 0.0–0.1)
Basophils Relative: 1 %
EOS ABS: 0.3 10*3/uL (ref 0.0–0.5)
Eosinophils Relative: 2 %
HCT: 35.5 % — ABNORMAL LOW (ref 39.0–52.0)
Hemoglobin: 12.7 g/dL — ABNORMAL LOW (ref 13.0–17.0)
Immature Granulocytes: 0 %
Lymphocytes Relative: 28 %
Lymphs Abs: 3.7 10*3/uL (ref 0.7–4.0)
MCH: 34.1 pg — ABNORMAL HIGH (ref 26.0–34.0)
MCHC: 35.8 g/dL (ref 30.0–36.0)
MCV: 95.4 fL (ref 80.0–100.0)
Monocytes Absolute: 0.8 10*3/uL (ref 0.1–1.0)
Monocytes Relative: 6 %
NRBC: 0.3 % — AB (ref 0.0–0.2)
Neutro Abs: 8.3 10*3/uL — ABNORMAL HIGH (ref 1.7–7.7)
Neutrophils Relative %: 63 %
Platelets: 353 10*3/uL (ref 150–400)
RBC: 3.72 MIL/uL — ABNORMAL LOW (ref 4.22–5.81)
RDW: 16 % — ABNORMAL HIGH (ref 11.5–15.5)
WBC: 13.2 10*3/uL — ABNORMAL HIGH (ref 4.0–10.5)

## 2018-11-02 LAB — COMPREHENSIVE METABOLIC PANEL
ALT: 25 U/L (ref 0–44)
ANION GAP: 10 (ref 5–15)
AST: 33 U/L (ref 15–41)
Albumin: 5 g/dL (ref 3.5–5.0)
Alkaline Phosphatase: 98 U/L (ref 38–126)
BUN: 11 mg/dL (ref 6–20)
CALCIUM: 9.9 mg/dL (ref 8.9–10.3)
CO2: 24 mmol/L (ref 22–32)
CREATININE: 0.88 mg/dL (ref 0.61–1.24)
Chloride: 105 mmol/L (ref 98–111)
GFR calc Af Amer: >60 mL/min (ref >60)
GFR calc non Af Amer: >60 mL/min (ref >60)
Glucose, Bld: 100 mg/dL — ABNORMAL HIGH (ref 70–99)
Potassium: 4.3 mmol/L (ref 3.5–5.1)
Sodium: 139 mmol/L (ref 135–145)
Total Bilirubin: 1 mg/dL (ref 0.3–1.2)
Total Protein: 8.3 g/dL — ABNORMAL HIGH (ref 6.5–8.1)

## 2018-11-02 LAB — RETICULOCYTES
Immature Retic Fract: 39.8 % — ABNORMAL HIGH (ref 2.3–15.9)
RBC.: 3.72 MIL/uL — AB (ref 4.22–5.81)
Retic Count, Absolute: 97.1 10*3/uL (ref 19.0–186.0)
Retic Ct Pct: 2.6 % (ref 0.4–3.1)

## 2018-11-02 MED ORDER — DEXTROSE-NACL 5-0.45 % IV SOLN
INTRAVENOUS | Status: DC
  Administered 2018-11-02: 21:00:00 via INTRAVENOUS

## 2018-11-02 MED ORDER — MORPHINE SULFATE (PF) 4 MG/ML IV SOLN
6.0000 mg | INTRAVENOUS | Status: DC | PRN
  Administered 2018-11-02 (×2): 6 mg via INTRAVENOUS
  Filled 2018-11-02 (×2): qty 2

## 2018-11-02 MED ORDER — HYDROMORPHONE HCL 1 MG/ML IJ SOLN
0.5000 mg | Freq: Once | INTRAMUSCULAR | Status: AC
  Administered 2018-11-02: 0.5 mg via SUBCUTANEOUS
  Filled 2018-11-02: qty 1

## 2018-11-02 MED ORDER — ONDANSETRON 4 MG PO TBDP
4.0000 mg | ORAL_TABLET | Freq: Once | ORAL | Status: AC
  Administered 2018-11-02: 4 mg via ORAL
  Filled 2018-11-02: qty 1

## 2018-11-02 MED ORDER — OXYCODONE HCL 5 MG PO TABS
5.0000 mg | ORAL_TABLET | Freq: Once | ORAL | Status: AC
  Administered 2018-11-02: 5 mg via ORAL
  Filled 2018-11-02: qty 1

## 2018-11-02 MED ORDER — KETOROLAC TROMETHAMINE 30 MG/ML IJ SOLN
30.0000 mg | INTRAMUSCULAR | Status: AC
  Administered 2018-11-02: 30 mg via INTRAVENOUS
  Filled 2018-11-02: qty 1

## 2018-11-02 MED ORDER — SODIUM CHLORIDE 0.9% FLUSH
3.0000 mL | Freq: Once | INTRAVENOUS | Status: AC
  Administered 2018-11-02: 3 mL via INTRAVENOUS

## 2018-11-02 NOTE — ED Notes (Signed)
Pt tolerating PO fluids, RN gave pt another sprite

## 2018-11-02 NOTE — ED Triage Notes (Signed)
Patient complaining of having pain all over. Patient states that he is having a sickle cell crisis. Patient states his medication is not working.

## 2018-11-02 NOTE — ED Notes (Signed)
MD at bedside at this time.

## 2018-11-02 NOTE — ED Notes (Signed)
Patient requesting more pain medication. MD made aware.

## 2018-11-02 NOTE — ED Provider Notes (Signed)
[facility]-EMERGENCY DEPT Provider Note   CSN: [id number] Arrival date & time: 11/02/18  1841    History   Chief Complaint Chief Complaint  Patient presents with  . Sickle Cell Pain Crisis    HPI Jonathan [name] is a 18 y.o. male.     The history is provided by the patient and medical records.  Sickle Cell Pain Crisis  Location:  Upper extremity Severity:  Severe Duration:  4 hours Similar to previous crisis episodes: yes   Timing:  Constant Progression:  Worsening Chronicity:  Recurrent Sickle cell genotype:  SS Frequency of attacks:  Yearly History of pulmonary emboli: no   Context: not dehydration, not infection and not stress   Relieved by:  None tried Worsened by:  Nothing Ineffective treatments:  None tried Associated symptoms: no chest pain, no fever, no nausea and no shortness of breath     Past Medical History:  Diagnosis Date  . Acute chest syndrome(517.3) 2013   march 2013, Sept 2013  . Asthma   . Avascular necrosis of bone of left hip (HCC)   . 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 Sickle cell disease with crisis (HCC)   . Sickle cell disease, type SS (HCC)   . Sickle cell pain crisis (HCC) 10/02/2012  . Vaso-occlusive sickle cell crisis (HCC) 11/09/2011    Patient Active Problem List   Diagnosis Date Noted  . Influenza A 10/13/2017  . Sickle cell disease (HCC) 04/27/2017  . Failed hearing screening 03/25/2016  . 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 Avascular necrosis of bone of left hip (HCC) 03/24/2013  . Chronic obstruct airways disease (HCC) 03/15/2013  . Snoring 04/08/2012  . Hypertension 11/11/2011  . Reactive airway disease 11/09/2011  . 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 ADENOIDECTOMY    . SPLENECTOMY    . TONSILLECTOMY    . TONSILLECTOMY AND ADENOIDECTOMY    . TOTAL HIP  ARTHROPLASTY Left 03/07/2016  . UMBILICAL HERNIA REPAIR          Home Medications    Prior to Admission medications   Medication Sig Start Date End Date Taking? Authorizing Provider  folic acid (FOLVITE) 1 MG tablet Take 1 tablet (1 mg total) by mouth daily. 12/10/17  Yes Massie Maroon, FNP  hydroxyurea (HYDREA) 500 MG capsule Take 1,500 mg by mouth daily. 03/29/16  Yes [provider]  ibuprofen (ADVIL,MOTRIN) 600 MG tablet Take 1 tablet (600 mg total) by mouth every 6 (six) hours as needed for moderate pain. 12/10/17  Yes Massie Maroon, FNP  oxyCODONE (OXY IR/ROXICODONE) 5 MG immediate release tablet Take 5 mg by mouth every 4 (four) hours as needed for severe pain.   Yes [provider]  ergocalciferol (DRISDOL) 50000 units capsule Take 1 capsule (50,000 Units total) by mouth once a week. 12/11/17   Massie Maroon, FNP    Family History Family History  Problem Relation Age of Onset  . Hypertension Mother   . Hypertension Maternal Grandmother   . Hypertension Maternal Grandfather   . Asthma Paternal Grandfather   . Diabetes Paternal Grandfather     Social History Social History   Tobacco Use  . Smoking status: Never Smoker  . Smokeless tobacco: Never Used  Substance Use Topics  . Alcohol use: No    Alcohol/week: 0.0 standard drinks  . Drug use:  No     Allergies   Patient has no known allergies.   Review of Systems Review of Systems  Constitutional: Negative for fever.  Respiratory: Negative for shortness of breath.   Cardiovascular: Negative for chest pain.  Gastrointestinal: Negative for nausea.  All other systems reviewed and are negative.    Physical Exam Updated Vital Signs BP (!) 134/94   Pulse 81   Temp 99.3 F (37.4 C) (Oral)   Resp 16   Ht 5\' 5"  (1.651 m)   Wt 70.3 kg   SpO2 99%   BMI 25.79 kg/m   Physical Exam Vitals signs and nursing note reviewed.  Constitutional:      Appearance: He is well-developed.  HENT:      Head: Normocephalic and atraumatic.     Mouth/Throat:     Mouth: Mucous membranes are dry.     Pharynx: Oropharynx is clear.  Eyes:     Extraocular Movements: Extraocular movements intact.     Conjunctiva/sclera: Conjunctivae normal.  Neck:     Musculoskeletal: Normal range of motion.  Cardiovascular:     Rate and Rhythm: Normal rate.  Pulmonary:     Effort: Pulmonary effort is normal. No respiratory distress.  Abdominal:     General: Bowel sounds are normal. There is no distension.     Palpations: Abdomen is soft.  Musculoskeletal: Normal range of motion.        General: Tenderness (mild to compression of rue) present. No swelling.  Skin:    General: Skin is warm and dry.     Coloration: Skin is not jaundiced or pale.  Neurological:     General: No focal deficit present.     Mental Status: He is alert.      ED Treatments / Results  Labs (all labs ordered are listed, but only abnormal results are displayed) Labs Reviewed  COMPREHENSIVE METABOLIC PANEL - Abnormal; Notable for the following components:      Result Value   Glucose, Bld 100 (*)    Total Protein 8.3 (*)    All other components within normal limits  CBC WITH DIFFERENTIAL/PLATELET - Abnormal; Notable for the following components:   WBC 13.2 (*)    RBC 3.72 (*)    Hemoglobin 12.7 (*)    HCT 35.5 (*)    MCH 34.1 (*)    RDW 16.0 (*)    nRBC 0.3 (*)    Neutro Abs 8.3 (*)    All other components within normal limits  RETICULOCYTES - Abnormal; Notable for the following components:   RBC. 3.72 (*)    Immature Retic Fract 39.8 (*)    All other components within normal limits    EKG None  Radiology No results found.  Procedures Procedures (including critical care time)  Medications Ordered in ED Medications  dextrose 5 %-0.45 % sodium chloride infusion ( Intravenous Stopped 11/02/18 2337)  morphine 4 MG/ML injection 6 mg (6 mg Intravenous Given 11/02/18 2226)  sodium chloride flush (NS) 0.9 %  injection 3 mL (3 mLs Intravenous Given 11/02/18 2106)  HYDROmorphone (DILAUDID) injection 0.5 mg (0.5 mg Subcutaneous Given 11/02/18 2105)  ondansetron (ZOFRAN-ODT) disintegrating tablet 4 mg (4 mg Oral Given 11/02/18 2104)  ketorolac (TORADOL) 30 MG/ML injection 30 mg (30 mg Intravenous Given 11/02/18 2119)  oxyCODONE (Oxy IR/ROXICODONE) immediate release tablet 5 mg (5 mg Oral Given 11/02/18 2325)     Initial Impression / Assessment and Plan / ED Course  I have reviewed the triage vital  signs and the nursing notes.  Pertinent labs & imaging results that were available during my care of the patient were reviewed by me and considered in my medical decision making (see chart for details).  Intact pulse, no swelling to have concern for vascular abnormality such as blood clot or arterial insufficiency.  No evidence of trauma or concern for broken bones.  Is consistent with his previous sickle cell pain crisis.  Treated with multiple doses of pain medicine with significant provement in his symptoms.  Will continue home medications but seems to be stable for discharge at this time.  Final Clinical Impressions(s) / ED Diagnoses   Final diagnoses:  Sickle cell pain crisis [facility](HCC)    ED Discharge Orders    None       Jermarion Poffenberger, Barbara CowerJason, MD 11/02/18 [phone]

## 2018-11-04 ENCOUNTER — Other Ambulatory Visit: Payer: Self-pay

## 2018-11-04 ENCOUNTER — Encounter: Payer: Self-pay | Admitting: Family Medicine

## 2018-11-04 ENCOUNTER — Ambulatory Visit (INDEPENDENT_AMBULATORY_CARE_PROVIDER_SITE_OTHER): Payer: Self-pay | Admitting: Family Medicine

## 2018-11-04 VITALS — BP 119/83 | HR 89 | Temp 98.0°F | Ht 65.0 in | Wt 154.0 lb

## 2018-11-04 DIAGNOSIS — Z9109 Other allergy status, other than to drugs and biological substances: Secondary | ICD-10-CM

## 2018-11-04 DIAGNOSIS — D571 Sickle-cell disease without crisis: Secondary | ICD-10-CM

## 2018-11-04 LAB — POCT URINALYSIS DIP (MANUAL ENTRY)
Bilirubin, UA: NEGATIVE
Blood, UA: NEGATIVE
Glucose, UA: NEGATIVE mg/dL
Ketones, POC UA: NEGATIVE mg/dL
Leukocytes, UA: NEGATIVE
Nitrite, UA: NEGATIVE
Protein Ur, POC: NEGATIVE mg/dL
Spec Grav, UA: 1.01 (ref 1.010–1.025)
Urobilinogen, UA: NEGATIVE E.U./dL — AB
pH, UA: 7 (ref 5.0–8.0)

## 2018-11-04 NOTE — Progress Notes (Signed)
[facility] INTERNAL MEDICINE AND SICKLE CELL CARE  SICKLE CELL ANEMIA FOLLOW UP VISIT PROVIDER: Mike Gip, FNP    Subjective:   Jonathan [name]  is a 18 y.o.  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 presents for a follow up for Sickle Cell Anemia. The patient has had 0 admissions in the past 6 months. Reports having a crisis 2 days ago Seen at the ED on Tuesday. Crisis resolved.  Pain regimen includes: Ibuprofen and percocet  Hydrea Therapy: Yes Medication compliance: Yes Pain today is 0/10.  Patient reports drinking 60 ounces of fluid per day.  Mild nasal congestion and cough x 2 weeks. Taken otc medications with mild relief.  No recent travel or concerns for influenza or COVD-19. Sickle cell follow up.     Followed by hematology.   Review of Systems  Constitutional: Negative.   HENT: Positive for congestion. Negative for sore throat.   Eyes: Negative.   Respiratory: Positive for cough.   Cardiovascular: Negative.   Gastrointestinal: Negative.   Genitourinary: Negative.   Musculoskeletal: Negative.   Skin: Negative.   Neurological: Negative.   Endo/Heme/Allergies: Positive for environmental allergies.  Psychiatric/Behavioral: Negative.     Objective:   Objective  BP 119/83 (BP Location: Left Arm, Patient Position: Sitting, Cuff Size: Small)   Pulse 89   Temp 98 F (36.7 C) (Oral)   Ht 5\' 5"  (1.651 m)   Wt 154 lb (69.9 kg)   SpO2 97%   BMI 25.63 kg/m   Wt Readings from Last 3 Encounters:  11/04/18 154 lb (69.9 kg) (51 %, Z= 0.01)*  11/02/18 155 lb (70.3 kg) (52 %, Z= 0.06)*  08/09/18 153 lb (69.4 kg) (50 %, Z= 0.01)*   * Growth  percentiles are based on CDC (Boys, 2-20 Years) data.     Physical Exam Vitals signs and nursing note reviewed.  Constitutional:      General: He is not in acute distress.    Appearance: He is well-developed.  HENT:     Head: Normocephalic and atraumatic.  Eyes:     Conjunctiva/sclera: Conjunctivae normal.     Pupils: Pupils are equal, round, and reactive to light.  Neck:     Musculoskeletal: Normal range of motion.  Cardiovascular:     Rate and Rhythm: Normal rate and regular rhythm.     Heart sounds: Normal heart sounds.  Pulmonary:     Effort: Pulmonary effort is normal. No respiratory distress.     Breath sounds: Normal breath sounds.  Abdominal:     General: Bowel sounds are normal. There is no distension.     Palpations: Abdomen is soft.  Musculoskeletal: Normal range of motion.  Skin:    General: Skin is warm and dry.  Neurological:     Mental Status: He is alert and oriented to person, place, and time.  Psychiatric:        Behavior: Behavior normal.        Thought Content: Thought content normal.      Assessment/Plan:  1. Hb-SS disease without crisis (HCC) No medication changes warranted at the present time.   - POCT urinalysis dipstick  2. Environmental allergies Patient offered flonase and antihistamine. He states that  he will continue with what he is taking otc.   Return to care as scheduled and prn. Patient verbalized understanding and agreed with plan of care.   1. Sickle cell disease - Continue Hydrea   We discussed the need for good hydration, monitoring of hydration status, avoidance of heat, cold, stress, and infection triggers. We discussed the risks and benefits of Hydrea, including bone marrow suppression, the possibility of GI upset, skin ulcers, hair thinning, and teratogenicity. The patient was reminded of the need to seek medical attention of any symptoms of bleeding, anemia, or infection. Continue folic acid 1 mg daily to prevent aplastic bone  marrow crises.   2. Pulmonary evaluation - Patient denies severe recurrent wheezes, shortness of breath with exercise, or persistent cough. If these symptoms develop, pulmonary function tests with spirometry will be ordered, and if abnormal, plan on referral to Pulmonology for further evaluation.  3. Cardiac - Routine screening for pulmonary hypertension is not recommended.  4. Eye - High risk of proliferative retinopathy. Annual eye exam with retinal exam recommended to patient.  5. Immunization status -  Yearly influenza vaccination is recommended, as well as being up to date with Meningococcal and Pneumococcal vaccines.   6. Acute and chronic painful episodes - We discussed that pt is to receive Schedule II prescriptions only from [place]. Pt is also aware that the prescription history is available to [place] online through the [facility] CSRS. Controlled substance agreement signed. We reminded Lunsford Halperin that all patients receiving Schedule II narcotics must be seen for follow within one month of prescription being requested. We reviewed the terms of our pain agreement, including the need to keep medicines in a safe locked location away from children or pets, and the need to report excess sedation or constipation, measures to avoid constipation, and policies related to early refills and stolen prescriptions. According to the [place] Chronic Pain Initiative program, we have reviewed details related to analgesia, adverse effects, aberrant behaviors.  7. Iron overload from chronic transfusion.  Not applicable at this time.  If this occurs will use Exjade for management.   8. Vitamin D deficiency - Drisdol 50,000 units weekly. Patient encouraged to take as prescribed.   The above recommendations are taken from the NIH Evidence-Based Management of Sickle Cell Disease: Expert Panel Report, 29924.   Ms. Andr L. Riley Lam, FNP-BC [facility] [address] [place], [place] [address]  [phone]  This note has been created with Dragon speech recognition software and smart phrase technology. Any transcriptional errors are unintentional.

## 2018-11-08 ENCOUNTER — Ambulatory Visit: Payer: Medicaid Other | Admitting: Family Medicine

## 2018-11-08 ENCOUNTER — Other Ambulatory Visit: Payer: Medicaid Other

## 2018-11-09 ENCOUNTER — Other Ambulatory Visit: Payer: Medicaid Other

## 2018-11-09 ENCOUNTER — Other Ambulatory Visit: Payer: Self-pay

## 2018-11-09 DIAGNOSIS — D571 Sickle-cell disease without crisis: Secondary | ICD-10-CM

## 2018-11-10 LAB — CBC WITH DIFFERENTIAL/PLATELET
Basophils Absolute: 0.1 10*3/uL (ref 0.0–0.2)
Basos: 1 %
EOS (ABSOLUTE): 0.2 10*3/uL (ref 0.0–0.4)
Eos: 2 %
Hematocrit: 31 % — ABNORMAL LOW (ref 37.5–51.0)
Hemoglobin: 10.8 g/dL — ABNORMAL LOW (ref 13.0–17.7)
Immature Grans (Abs): 0 10*3/uL (ref 0.0–0.1)
Immature Granulocytes: 0 %
Lymphocytes Absolute: 2.4 10*3/uL (ref 0.7–3.1)
Lymphs: 27 %
MCH: 34.8 pg — ABNORMAL HIGH (ref 26.6–33.0)
MCHC: 34.8 g/dL (ref 31.5–35.7)
MCV: 100 fL — ABNORMAL HIGH (ref 79–97)
Monocytes Absolute: 0.5 10*3/uL (ref 0.1–0.9)
Monocytes: 6 %
NRBC: 2 % — ABNORMAL HIGH (ref 0–0)
Neutrophils Absolute: 5.5 10*3/uL (ref 1.4–7.0)
Neutrophils: 64 %
Platelets: 415 10*3/uL (ref 150–450)
RBC: 3.1 x10E6/uL — ABNORMAL LOW (ref 4.14–5.80)
RDW: 17.3 % — ABNORMAL HIGH (ref 11.6–15.4)
WBC: 8.7 10*3/uL (ref 3.4–10.8)

## 2018-11-10 LAB — COMPREHENSIVE METABOLIC PANEL
ALT: 12 IU/L (ref 0–44)
AST: 20 IU/L (ref 0–40)
Albumin/Globulin Ratio: 2.1 (ref 1.2–2.2)
Albumin: 4.4 g/dL (ref 4.1–5.2)
Alkaline Phosphatase: 109 IU/L (ref 39–117)
BUN/Creatinine Ratio: 10 (ref 9–20)
BUN: 7 mg/dL (ref 6–20)
Bilirubin Total: 0.7 mg/dL (ref 0.0–1.2)
CO2: 20 mmol/L (ref 20–29)
Calcium: 9.2 mg/dL (ref 8.7–10.2)
Chloride: 108 mmol/L — ABNORMAL HIGH (ref 96–106)
Creatinine, Ser: 0.68 mg/dL — ABNORMAL LOW (ref 0.76–1.27)
GFR calc Af Amer: 160 mL/min/{1.73_m2} (ref >59)
GFR calc non Af Amer: 138 mL/min/{1.73_m2} (ref >59)
Globulin, Total: 2.1 g/dL (ref 1.5–4.5)
Glucose: 98 mg/dL (ref 65–99)
Potassium: 4.4 mmol/L (ref 3.5–5.2)
Sodium: 141 mmol/L (ref 134–144)
Total Protein: 6.5 g/dL (ref 6.0–8.5)

## 2018-11-10 LAB — VITAMIN D 25 HYDROXY (VIT D DEFICIENCY, FRACTURES): Vit D, 25-Hydroxy: 19.1 ng/mL — ABNORMAL LOW (ref 30.0–100.0)

## 2018-11-19 ENCOUNTER — Other Ambulatory Visit: Payer: Self-pay | Admitting: Family Medicine

## 2018-11-19 DIAGNOSIS — D571 Sickle-cell disease without crisis: Secondary | ICD-10-CM

## 2018-11-19 MED ORDER — ERGOCALCIFEROL 1.25 MG (50000 UT) PO CAPS: 50000.0000 [IU] | ORAL_CAPSULE | ORAL | 1 refills | Status: DC

## 2019-02-03 ENCOUNTER — Telehealth: Payer: Self-pay

## 2019-02-03 NOTE — Telephone Encounter (Signed)
Called to do COVID Screening for appointment tomorrow. No answer. Left a message to call back. Thanks! 

## 2019-02-04 ENCOUNTER — Ambulatory Visit (INDEPENDENT_AMBULATORY_CARE_PROVIDER_SITE_OTHER): Payer: Medicaid Other | Admitting: Family Medicine

## 2019-02-04 ENCOUNTER — Encounter: Payer: Self-pay | Admitting: Family Medicine

## 2019-02-04 ENCOUNTER — Other Ambulatory Visit: Payer: Self-pay

## 2019-02-04 VITALS — BP 131/72 | HR 78 | Temp 98.6°F | Resp 14 | Ht 65.0 in | Wt 154.0 lb

## 2019-02-04 DIAGNOSIS — D571 Sickle-cell disease without crisis: Secondary | ICD-10-CM

## 2019-02-04 LAB — POCT URINALYSIS DIPSTICK
Bilirubin, UA: NEGATIVE
Blood, UA: NEGATIVE
Glucose, UA: NEGATIVE
Ketones, UA: NEGATIVE
Leukocytes, UA: NEGATIVE
Nitrite, UA: NEGATIVE
Protein, UA: NEGATIVE
Spec Grav, UA: 1.02 (ref 1.010–1.025)
Urobilinogen, UA: 0.2 E.U./dL
pH, UA: 6.5 (ref 5.0–8.0)

## 2019-02-04 MED ORDER — FOLIC ACID 1 MG PO TABS: 1.0000 mg | ORAL_TABLET | Freq: Every day | ORAL | 3 refills | Status: DC

## 2019-02-04 MED ORDER — OXYCODONE HCL 5 MG PO TABS: 5.0000 mg | ORAL_TABLET | ORAL | 0 refills | Status: AC | PRN

## 2019-02-04 MED ORDER — ERGOCALCIFEROL 1.25 MG (50000 UT) PO CAPS: 50000.0000 [IU] | ORAL_CAPSULE | ORAL | 1 refills | Status: DC

## 2019-02-04 MED ORDER — HYDROXYUREA 500 MG PO CAPS: 1500.0000 mg | ORAL_CAPSULE | Freq: Every day | ORAL | 2 refills | Status: DC

## 2019-02-04 NOTE — Patient Instructions (Signed)
Sickle Cell Anemia, Adult °Sickle cell anemia is a condition where your red blood cells are shaped like sickles. Red blood cells carry oxygen through the body. Sickle-shaped cells do not live as long as normal red blood cells. They also clump together and block blood from flowing through the blood vessels. This prevents the body from getting enough oxygen. Sickle cell anemia causes organ damage and pain. It also increases the risk of infection. °Follow these instructions at home: °Medicines °· Take over-the-counter and prescription medicines only as told by your doctor. °· If you were prescribed an antibiotic medicine, take it as told by your doctor. Do not stop taking the antibiotic even if you start to feel better. °· If you develop a fever, do not take medicines to lower the fever right away. Tell your doctor about the fever. °Managing pain, stiffness, and swelling °· Try these methods to help with pain: °? Use a heating pad. °? Take a warm bath. °? Distract yourself, such as by watching TV. °Eating and drinking °· Drink enough fluid to keep your pee (urine) clear or pale yellow. Drink more in hot weather and during exercise. °· Limit or avoid alcohol. °· Eat a healthy diet. Eat plenty of fruits, vegetables, whole grains, and lean protein. °· Take vitamins and supplements as told by your doctor. °Traveling °· When traveling, keep these with you: °? Your medical information. °? The names of your doctors. °? Your medicines. °· If you need to take an airplane, talk to your doctor first. °Activity °· Rest often. °· Avoid exercises that make your heart beat much faster, such as jogging. °General instructions °· Do not use products that have nicotine or tobacco, such as cigarettes and e-cigarettes. If you need help quitting, ask your doctor. °· Consider wearing a medical alert bracelet. °· Avoid being in high places (high altitudes), such as mountains. °· Avoid very hot or cold temperatures. °· Avoid places where the  temperature changes a lot. °· Keep all follow-up visits as told by your doctor. This is important. °Contact a doctor if: °· A joint hurts. °· Your feet or hands hurt or swell. °· You feel tired (fatigued). °Get help right away if: °· You have symptoms of infection. These include: °? Fever. °? Chills. °? Being very tired. °? Irritability. °? Poor eating. °? Throwing up (vomiting). °· You feel dizzy or faint. °· You have new stomach pain, especially on the left side. °· You have a an erection (priapism) that lasts more than 4 hours. °· You have numbness in your arms or legs. °· You have a hard time moving your arms or legs. °· You have trouble talking. °· You have pain that does not go away when you take medicine. °· You are short of breath. °· You are breathing fast. °· You have a long-term cough. °· You have pain in your chest. °· You have a bad headache. °· You have a stiff neck. °· Your stomach looks bloated even though you did not eat much. °· Your skin is pale. °· You suddenly cannot see well. °Summary °· Sickle cell anemia is a condition where your red blood cells are shaped like sickles. °· Follow your doctor's advice on ways to manage pain, food to eat, activities to do, and steps to take for safe travel. °· Get medical help right away if you have any signs of infection, such as a fever. °This information is not intended to replace advice given to you by your   health care provider. Make sure you discuss any questions you have with your health care provider. °Document Released: 06/01/2013 Document Revised: 09/16/2016 Document Reviewed: 09/16/2016 °Elsevier Interactive Patient Education © 2019 [employer] Inc[employer] ° °

## 2019-02-04 NOTE — Progress Notes (Signed)
[facility] INTERNAL MEDICINE AND SICKLE CELL CARE  SICKLE CELL ANEMIA FOLLOW UP VISIT PROVIDER: Mike GipAndre Akiya Morr, FNP    Subjective:   Jonathan [name]  is a 18 y.o.  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 presents for a follow up for Sickle Cell Anemia. The patient has had 0 admissions in the past 6 months.  Pain regimen includes: Ibuprofen and oxycodone IR Hydrea Therapy: Yes Medication compliance: Yes  Pain today is 0/10 The patient reports adequate daily hydration.    Review of Systems  Constitutional: Negative.   HENT: Negative.   Eyes: Negative.   Respiratory: Negative.   Cardiovascular: Negative.   Gastrointestinal: Negative.   Genitourinary: Negative.   Musculoskeletal: Negative.   Skin: Negative.   Neurological: Negative.   Psychiatric/Behavioral: Negative.     Objective:   Objective  BP 131/72 (BP Location: Left Arm, Patient Position: Sitting, Cuff Size: Normal)   Pulse 78   Temp 98.6 F (37 C) (Oral)   Resp 14   Ht 5\' 5"  (1.651 m)   Wt 154 lb (69.9 kg)   SpO2 100%   BMI 25.63 kg/m   Wt Readings from Last 3 Encounters:  02/04/19 154 lb (69.9 kg) (49 %, Z= -0.02)*  11/04/18 154 lb (69.9 kg) (51 %, Z= 0.01)*  11/02/18 155 lb (70.3 kg) (52 %, Z= 0.06)*   * Growth percentiles are based on CDC (Boys, 2-20 Years) data.     Physical Exam Vitals signs and nursing note reviewed.  Constitutional:      General: He is not in acute distress.    Appearance: Normal appearance.  HENT:     Head: Normocephalic and atraumatic.  Eyes:     Extraocular Movements: Extraocular movements intact.     Conjunctiva/sclera: Conjunctivae normal.   Pupils: Pupils are equal, round, and reactive to light.  Cardiovascular:     Rate and Rhythm: Normal rate and regular rhythm.     Heart sounds: No murmur.  Pulmonary:     Effort: Pulmonary effort is normal.     Breath sounds: Normal breath sounds.  Musculoskeletal: Normal range of motion.  Skin:    General: Skin is warm and dry.  Neurological:     Mental Status: He is alert and oriented to person, place, and time.  Psychiatric:        Mood and Affect: Mood normal.        Behavior: Behavior normal.        Thought Content: Thought content normal.        Judgment: Judgment normal.      Assessment/Plan:   Assessment   Encounter Diagnosis  Name Primary?  . Sickle cell disease, type SS (HCC) Yes     Plan  1. Sickle cell disease, type SS (HCC) - Urinalysis Dipstick - folic acid (FOLVITE) 1 MG tablet; Take 1 tablet (1 mg total) by mouth daily.  Dispense: 90 tablet; Refill: 3 - hydroxyurea (HYDREA) 500 MG capsule; Take 3 capsules (1,500 mg total) by mouth daily.  Dispense: 90 capsule; Refill: 2 - oxyCODONE (OXY IR/ROXICODONE) 5 MG immediate release tablet; Take 1 tablet (5 mg total) by mouth every 4 (four) hours as needed for up to 15 days for  severe pain.  Dispense: 60 tablet; Refill: 0 - ergocalciferol (DRISDOL) 1.25 MG (50000 UT) capsule; Take 1 capsule (50,000 Units total) by mouth once a week.  Dispense: 30 capsule; Refill: 1 - CBC with Differential - Comprehensive metabolic panel   Return to care as scheduled and prn. Patient verbalized understanding and agreed with plan of care.   1. Sickle cell disease - Continue Hydrea   We discussed the need for good hydration, monitoring of hydration status, avoidance of heat, cold, stress, and infection triggers. We discussed the risks and benefits of Hydrea, including bone marrow suppression, the possibility of GI upset, skin ulcers, hair thinning, and teratogenicity. The patient was reminded of the need to seek medical attention of any  symptoms of bleeding, anemia, or infection. Continue folic acid 1 mg daily to prevent aplastic bone marrow crises.   2. Pulmonary evaluation - Patient denies severe recurrent wheezes, shortness of breath with exercise, or persistent cough. If these symptoms develop, pulmonary function tests with spirometry will be ordered, and if abnormal, plan on referral to Pulmonology for further evaluation.  3. Cardiac - Routine screening for pulmonary hypertension is not recommended.  4. Eye - High risk of proliferative retinopathy. Annual eye exam with retinal exam recommended to patient.  5. Immunization status -  Yearly influenza vaccination is recommended, as well as being up to date with Meningococcal and Pneumococcal vaccines.   6. Acute and chronic painful episodes - We discussed that pt is to receive Schedule II prescriptions only from [place]. Pt is also aware that the prescription history is available to [place] online through the [facility] CSRS. Controlled substance agreement signed. We reminded Jonathan [name] that all patients receiving Schedule II narcotics must be seen for follow within one month of prescription being requested. We reviewed the terms of our pain agreement, including the need to keep medicines in a safe locked location away from children or pets, and the need to report excess sedation or constipation, measures to avoid constipation, and policies related to early refills and stolen prescriptions. According to the [place] Chronic Pain Initiative program, we have reviewed details related to analgesia, adverse effects, aberrant behaviors.  7. Iron overload from chronic transfusion.  Not applicable at this time.  If this occurs will use Exjade for management.   8. Vitamin D deficiency - Drisdol 50,000 units weekly. Patient encouraged to take as prescribed.   The above recommendations are taken from the NIH Evidence-Based Management of Sickle Cell Disease: Expert Panel Report, 20149.   Ms. Andr L. Nathaneil Canary,  FNP-BC [facility] [address] [place],  [phone]  This note has been created with Dragon speech recognition software and smart phrase technology. Any transcriptional errors are unintentional.

## 2019-02-05 LAB — COMPREHENSIVE METABOLIC PANEL
ALT: 5 IU/L (ref 0–44)
AST: 15 IU/L (ref 0–40)
Albumin/Globulin Ratio: 2.3 — ABNORMAL HIGH (ref 1.2–2.2)
Albumin: 4.8 g/dL (ref 4.1–5.2)
Alkaline Phosphatase: 78 IU/L (ref 39–117)
BUN/Creatinine Ratio: 8 — ABNORMAL LOW (ref 9–20)
BUN: 6 mg/dL (ref 6–20)
Bilirubin Total: 0.5 mg/dL (ref 0.0–1.2)
CO2: 22 mmol/L (ref 20–29)
Calcium: 9.7 mg/dL (ref 8.7–10.2)
Chloride: 106 mmol/L (ref 96–106)
Creatinine, Ser: 0.74 mg/dL — ABNORMAL LOW (ref 0.76–1.27)
GFR calc Af Amer: 155 mL/min/{1.73_m2} (ref >59)
GFR calc non Af Amer: 134 mL/min/{1.73_m2} (ref >59)
Globulin, Total: 2.1 g/dL (ref 1.5–4.5)
Glucose: 101 mg/dL — ABNORMAL HIGH (ref 65–99)
Potassium: 4.2 mmol/L (ref 3.5–5.2)
Sodium: 142 mmol/L (ref 134–144)
Total Protein: 6.9 g/dL (ref 6.0–8.5)

## 2019-02-05 LAB — CBC WITH DIFFERENTIAL/PLATELET
Basophils Absolute: 0 10*3/uL (ref 0.0–0.2)
Basos: 1 %
EOS (ABSOLUTE): 0.1 10*3/uL (ref 0.0–0.4)
Eos: 2 %
Hematocrit: 30.5 % — ABNORMAL LOW (ref 37.5–51.0)
Hemoglobin: 10.6 g/dL — ABNORMAL LOW (ref 13.0–17.7)
Immature Grans (Abs): 0 10*3/uL (ref 0.0–0.1)
Immature Granulocytes: 0 %
Lymphocytes Absolute: 1.3 10*3/uL (ref 0.7–3.1)
Lymphs: 45 %
MCH: 40 pg — ABNORMAL HIGH (ref 26.6–33.0)
MCHC: 34.8 g/dL (ref 31.5–35.7)
MCV: 115 fL — ABNORMAL HIGH (ref 79–97)
Monocytes Absolute: 0.2 10*3/uL (ref 0.1–0.9)
Monocytes: 7 %
NRBC: 2 % — ABNORMAL HIGH (ref 0–0)
Neutrophils Absolute: 1.3 10*3/uL — ABNORMAL LOW (ref 1.4–7.0)
Neutrophils: 45 %
Platelets: 239 10*3/uL (ref 150–450)
RBC: 2.65 x10E6/uL — CL (ref 4.14–5.80)
RDW: 17.6 % — ABNORMAL HIGH (ref 11.6–15.4)
WBC: 2.8 10*3/uL — ABNORMAL LOW (ref 3.4–10.8)

## 2019-05-09 ENCOUNTER — Ambulatory Visit: Payer: Medicaid Other | Admitting: Family Medicine

## 2019-05-11 ENCOUNTER — Encounter: Payer: Self-pay | Admitting: Family Medicine

## 2019-05-11 ENCOUNTER — Other Ambulatory Visit: Payer: Self-pay

## 2019-05-11 ENCOUNTER — Ambulatory Visit (INDEPENDENT_AMBULATORY_CARE_PROVIDER_SITE_OTHER): Payer: Self-pay | Admitting: Family Medicine

## 2019-05-11 VITALS — BP 135/68 | HR 89 | Temp 98.7°F | Resp 16 | Ht 65.0 in | Wt 152.0 lb

## 2019-05-11 DIAGNOSIS — D571 Sickle-cell disease without crisis: Secondary | ICD-10-CM

## 2019-05-11 LAB — POCT URINALYSIS DIPSTICK
Bilirubin, UA: NEGATIVE
Blood, UA: NEGATIVE
Glucose, UA: NEGATIVE
Ketones, UA: NEGATIVE
Leukocytes, UA: NEGATIVE
Nitrite, UA: NEGATIVE
Protein, UA: NEGATIVE
Spec Grav, UA: 1.02 (ref 1.010–1.025)
Urobilinogen, UA: 4 E.U./dL — AB
pH, UA: 6.5 (ref 5.0–8.0)

## 2019-05-11 NOTE — Patient Instructions (Signed)

## 2019-05-11 NOTE — Progress Notes (Signed)
[facility]ICINE AND SICKLE CELL CARE  SICKLE CELL ANEMIA FOLLOW UP VISIT PROVIDER: Mike GipAndre Gilbert Manolis, FNP    Subjective:   Jonathan [name]  is a 18 y.o.  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 presents for a follow up for Sickle Cell Anemia. The patient has had 0 admissions in the past 6 months.  Pain regimen includes: Ibuprofen and tylenol Hydrea Therapy: Yes Medication compliance: Yes  Pain today is 0/10. The patient reports adequate daily hydration.     Review of Systems  Constitutional: Negative.   HENT: Negative.   Eyes: Negative.   Respiratory: Negative.   Cardiovascular: Negative.   Gastrointestinal: Negative.   Genitourinary: Negative.   Musculoskeletal: Negative.   Skin: Negative.   Neurological: Negative.   Psychiatric/Behavioral: Negative.     Objective:   Objective  BP 135/68 (BP Location: Left Arm, Patient Position: Sitting, Cuff Size: Normal)   Pulse 89   Temp 98.7 F (37.1 C) (Oral)   Resp 16   Ht 5\' 5"  (1.651 m)   Wt 152 lb (68.9 kg)   SpO2 99%   BMI 25.29 kg/m   Wt Readings from Last 3 Encounters:  05/11/19 152 lb (68.9 kg) (45 %, Z= -0.13)*  02/04/19 154 lb (69.9 kg) (49 %, Z= -0.02)*  11/04/18 154 lb (69.9 kg) (51 %, Z= 0.01)*   * Growth percentiles are based on CDC (Boys, 2-20 Years) data.     Physical Exam Vitals signs and nursing note reviewed.  Constitutional:      General: He is not in acute distress.    Appearance: Normal appearance.  HENT:     Head: Normocephalic and atraumatic.  Eyes:     Extraocular Movements: Extraocular movements intact.     Conjunctiva/sclera: Conjunctivae normal.    Pupils: Pupils are equal, round, and reactive to light.  Cardiovascular:     Rate and Rhythm: Normal rate and regular rhythm.     Heart sounds: No murmur.  Pulmonary:     Effort: Pulmonary effort is normal.     Breath sounds: Normal breath sounds.  Musculoskeletal: Normal range of motion.  Skin:    General: Skin is warm and dry.  Neurological:     Mental Status: He is alert and oriented to person, place, and time.  Psychiatric:        Mood and Affect: Mood normal.        Behavior: Behavior normal.        Thought Content: Thought content normal.        Judgment: Judgment normal.      Assessment/Plan:   Assessment   Encounter Diagnosis  Name Primary?  . Sickle cell disease, type SS (HCC) Yes     Plan  1. Sickle cell disease, type SS (HCC) - Urinalysis Dipstick - CBC with Differential - Comprehensive metabolic panel   Return to care as scheduled and prn. Patient verbalized understanding and agreed with plan of care.   1. Sickle cell disease - Continue Hydrea   We discussed the need for good hydration, monitoring of hydration status, avoidance of heat, cold, stress, and infection triggers. We discussed the risks and benefits of Hydrea, including bone marrow suppression, the possibility of  GI upset, skin ulcers, hair thinning, and teratogenicity. The patient was reminded of the need to seek medical attention of any symptoms of bleeding, anemia, or infection. Continue folic acid 1 mg daily to prevent aplastic bone marrow crises.   2. Pulmonary evaluation - Patient denies severe recurrent wheezes, shortness of breath with exercise, or persistent cough. If these symptoms develop, pulmonary function tests with spirometry will be ordered, and if abnormal, plan on referral to Pulmonology for further evaluation.  3. Cardiac - Routine screening for pulmonary hypertension is not recommended.  4. Eye - High risk of proliferative retinopathy. Annual eye exam with retinal exam recommended  to patient.  5. Immunization status -  Yearly influenza vaccination is recommended, as well as being up to date with Meningococcal and Pneumococcal vaccines.   6. Acute and chronic painful episodes - We discussed that pt is to receive Schedule II prescriptions only from [place]. Pt is also aware that the prescription history is available to [place] online through the [facility] CSRS. Controlled substance agreement signed. We reminded Jonathan [name] that all patients receiving Schedule II narcotics must be seen for follow within one month of prescription being requested. We reviewed the terms of our pain agreement, including the need to keep medicines in a safe locked location away from children or pets, and the need to report excess sedation or constipation, measures to avoid constipation, and policies related to early refills and stolen prescriptions. According to the [place] [facility] program, we have reviewed details related to analgesia, adverse effects, aberrant behaviors.  7. Iron overload from chronic transfusion.  Not applicable at this time.  If this occurs will use Exjade for management.   8. Vitamin D deficiency - Drisdol 50,000 units weekly. Patient encouraged to take as prescribed.   The above recommendations are taken from the NIH Evidence-Based Management of Sickle Cell Disease: Expert Panel Report, 20149.   Ms. Andr L. Nathaneil Canary, FNP-BC [facility] [address] [place], [place] [address] [phone]  This note has been created with Dragon speech recognition software and smart phrase technology. Any transcriptional errors are unintentional.

## 2019-05-12 ENCOUNTER — Telehealth: Payer: Self-pay

## 2019-05-12 LAB — CBC WITH DIFFERENTIAL/PLATELET
Basophils Absolute: 0 10*3/uL (ref 0.0–0.2)
Basos: 1 %
EOS (ABSOLUTE): 0.1 10*3/uL (ref 0.0–0.4)
Eos: 2 %
Hematocrit: 27.1 % — ABNORMAL LOW (ref 37.5–51.0)
Hemoglobin: 9.4 g/dL — ABNORMAL LOW (ref 13.0–17.7)
Immature Grans (Abs): 0 10*3/uL (ref 0.0–0.1)
Immature Granulocytes: 0 %
Lymphocytes Absolute: 2.4 10*3/uL (ref 0.7–3.1)
Lymphs: 68 %
MCH: 39.3 pg — ABNORMAL HIGH (ref 26.6–33.0)
MCHC: 34.7 g/dL (ref 31.5–35.7)
MCV: 113 fL — ABNORMAL HIGH (ref 79–97)
Monocytes Absolute: 0.2 10*3/uL (ref 0.1–0.9)
Monocytes: 6 %
NRBC: 3 % — ABNORMAL HIGH (ref 0–0)
Neutrophils Absolute: 0.8 10*3/uL — ABNORMAL LOW (ref 1.4–7.0)
Neutrophils: 23 %
Platelets: 314 10*3/uL (ref 150–450)
RBC: 2.39 x10E6/uL — CL (ref 4.14–5.80)
RDW: 14.9 % (ref 11.6–15.4)
WBC: 3.4 10*3/uL (ref 3.4–10.8)

## 2019-05-12 LAB — COMPREHENSIVE METABOLIC PANEL
ALT: 9 IU/L (ref 0–44)
AST: 18 IU/L (ref 0–40)
Albumin/Globulin Ratio: 1.8 (ref 1.2–2.2)
Albumin: 4.7 g/dL (ref 4.1–5.2)
Alkaline Phosphatase: 93 IU/L (ref 39–117)
BUN/Creatinine Ratio: 7 — ABNORMAL LOW (ref 9–20)
BUN: 6 mg/dL (ref 6–20)
Bilirubin Total: 0.6 mg/dL (ref 0.0–1.2)
CO2: 23 mmol/L (ref 20–29)
Calcium: 9.5 mg/dL (ref 8.7–10.2)
Chloride: 104 mmol/L (ref 96–106)
Creatinine, Ser: 0.89 mg/dL (ref 0.76–1.27)
GFR calc Af Amer: 143 mL/min/{1.73_m2} (ref >59)
GFR calc non Af Amer: 124 mL/min/{1.73_m2} (ref >59)
Globulin, Total: 2.6 g/dL (ref 1.5–4.5)
Glucose: 88 mg/dL (ref 65–99)
Potassium: 4.4 mmol/L (ref 3.5–5.2)
Sodium: 141 mmol/L (ref 134–144)
Total Protein: 7.3 g/dL (ref 6.0–8.5)

## 2019-05-12 NOTE — Telephone Encounter (Signed)
Called, no answer. Left a message that all labs were stable and to keep follow up appointment and continue taking medications. Thanks!

## 2019-05-12 NOTE — Telephone Encounter (Signed)
-----   Message from Lanae Boast, [place] sent at 05/12/2019  9:03 AM EDT ----- Your labs are stable. Continue with your current medications. Please remember to keep your follow up appointment. If you have problems, questions or concerns, please make an appointment to discuss. Thanks!

## 2019-05-12 NOTE — Progress Notes (Signed)
Your labs are stable. Continue with your current medications. Please remember to keep your follow up appointment. If you have problems, questions or concerns, please make an appointment to discuss. Thanks!

## 2019-08-09 ENCOUNTER — Encounter: Payer: Self-pay | Admitting: Family Medicine

## 2019-08-09 ENCOUNTER — Other Ambulatory Visit: Payer: Self-pay

## 2019-08-09 ENCOUNTER — Ambulatory Visit (INDEPENDENT_AMBULATORY_CARE_PROVIDER_SITE_OTHER): Payer: Self-pay | Admitting: Family Medicine

## 2019-08-09 VITALS — BP 128/77 | HR 60 | Temp 97.1°F | Resp 14 | Ht 65.0 in | Wt 150.0 lb

## 2019-08-09 DIAGNOSIS — Z23 Encounter for immunization: Secondary | ICD-10-CM

## 2019-08-09 DIAGNOSIS — D571 Sickle-cell disease without crisis: Secondary | ICD-10-CM

## 2019-08-09 LAB — POCT URINALYSIS DIPSTICK
Bilirubin, UA: NEGATIVE
Blood, UA: NEGATIVE
Glucose, UA: NEGATIVE
Ketones, UA: NEGATIVE
Leukocytes, UA: NEGATIVE
Nitrite, UA: NEGATIVE
Protein, UA: NEGATIVE
Spec Grav, UA: 1.025 (ref 1.010–1.025)
Urobilinogen, UA: 0.2 E.U./dL
pH, UA: 5.5 (ref 5.0–8.0)

## 2019-08-09 NOTE — Patient Instructions (Signed)
Sickle Cell Anemia, Adult ° °Sickle cell anemia is a condition where your red blood cells are shaped like sickles. Red blood cells carry oxygen through the body. Sickle-shaped cells do not live as long as normal red blood cells. They also clump together and block blood from flowing through the blood vessels. This prevents the body from getting enough oxygen. Sickle cell anemia causes organ damage and pain. It also increases the risk of infection. °Follow these instructions at home: °Medicines °· Take over-the-counter and prescription medicines only as told by your doctor. °· If you were prescribed an antibiotic medicine, take it as told by your doctor. Do not stop taking the antibiotic even if you start to feel better. °· If you develop a fever, do not take medicines to lower the fever right away. Tell your doctor about the fever. °Managing pain, stiffness, and swelling °· Try these methods to help with pain: °? Use a heating pad. °? Take a warm bath. °? Distract yourself, such as by watching TV. °Eating and drinking °· Drink enough fluid to keep your pee (urine) clear or pale yellow. Drink more in hot weather and during exercise. °· Limit or avoid alcohol. °· Eat a healthy diet. Eat plenty of fruits, vegetables, whole grains, and lean protein. °· Take vitamins and supplements as told by your doctor. °Traveling °· When traveling, keep these with you: °? Your medical information. °? The names of your doctors. °? Your medicines. °· If you need to take an airplane, talk to your doctor first. °Activity °· Rest often. °· Avoid exercises that make your heart beat much faster, such as jogging. °General instructions °· Do not use products that have nicotine or tobacco, such as cigarettes and e-cigarettes. If you need help quitting, ask your doctor. °· Consider wearing a medical alert bracelet. °· Avoid being in high places (high altitudes), such as mountains. °· Avoid very hot or cold temperatures. °· Avoid places where the  temperature changes a lot. °· Keep all follow-up visits as told by your doctor. This is important. °Contact a doctor if: °· A joint hurts. °· Your feet or hands hurt or swell. °· You feel tired (fatigued). °Get help right away if: °· You have symptoms of infection. These include: °? Fever. °? Chills. °? Being very tired. °? Irritability. °? Poor eating. °? Throwing up (vomiting). °· You feel dizzy or faint. °· You have new stomach pain, especially on the left side. °· You have a an erection (priapism) that lasts more than 4 hours. °· You have numbness in your arms or legs. °· You have a hard time moving your arms or legs. °· You have trouble talking. °· You have pain that does not go away when you take medicine. °· You are short of breath. °· You are breathing fast. °· You have a long-term cough. °· You have pain in your chest. °· You have a bad headache. °· You have a stiff neck. °· Your stomach looks bloated even though you did not eat much. °· Your skin is pale. °· You suddenly cannot see well. °Summary °· Sickle cell anemia is a condition where your red blood cells are shaped like sickles. °· Follow your doctor's advice on ways to manage pain, food to eat, activities to do, and steps to take for safe travel. °· Get medical help right away if you have any signs of infection, such as a fever. °This information is not intended to replace advice given to you by   your health care provider. Make sure you discuss any questions you have with your health care provider. °Document Released: 06/01/2013 Document Revised: 12/03/2018 Document Reviewed: 09/16/2016 °Elsevier Patient Education © 2020 Elsevier Inc. ° °

## 2019-08-09 NOTE — Progress Notes (Signed)
[facility] Medicine [facility] Sickle Cell Care   Established Patient Office Visit  Subjective:  Patient ID: Jonathan [name], male    DOB: 07/17/2001  Age: 18 y.o. MRN: [id number]  CC:  Chief Complaint  Patient presents with  . Sickle Cell Anemia    HPI Jonathan [name], a 18 year old male with a medical history significant for sickle cell disease presents for follow-up.  Patient says that he has been doing well and is without complaint today. 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 He says that he has been staying hydrated.  He says that he attempts to take maintenance medications daily, but sometimes forgets.  He has infrequent sickle cell crises and mainly manages pain with ibuprofen.  Patient is under the care of hematology, Dr. Sammuel CooperNirmish Shah,  his next appointment is in 1 month.  Patient is without pain on today.  He denies headache, chest pain, shortness of breath, urinary symptoms, nausea, vomiting, or diarrhea.  No fever, chills, sick contacts, or exposure to COVID-19.  Past Medical History:  Diagnosis Date  . Acute chest syndrome(517.3) 2013   march 2013, Sept 2013  . Asthma   . Avascular necrosis of bone of left hip (HCC)   . 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 Sickle cell disease with crisis (HCC)   . Sickle cell disease, type SS (HCC)   . Sickle cell pain crisis (HCC) 10/02/2012  . Vaso-occlusive sickle cell crisis (HCC) 11/09/2011    Past Surgical History:  Procedure Laterality Date  . ADENOIDECTOMY    . SPLENECTOMY    . TONSILLECTOMY    . TONSILLECTOMY AND ADENOIDECTOMY    . TOTAL HIP ARTHROPLASTY Left 03/07/2016  . UMBILICAL HERNIA REPAIR      Family History  Problem Relation Age of Onset  . Hypertension Mother   . Hypertension Maternal Grandmother   . Hypertension Maternal Grandfather   . Asthma Paternal Grandfather   . Diabetes Paternal Grandfather     Social History   Socioeconomic History  . Marital  status: Single    Spouse name: Not on file  . Number of children: Not on file  . Years of education: Not on file  . Highest education level: Not on file  Occupational History  . Not on file  Tobacco Use  . Smoking status: Never Smoker  . Smokeless tobacco: Never Used  Substance and Sexual Activity  . Alcohol use: No    Alcohol/week: 0.0 standard drinks  . Drug use: No  . Sexual activity: Never  Other Topics Concern  . Not on file  Social History Narrative   Lives with mother.  No smokers at home      Update:       05/02/16 Lives with Mother only   Social Determinants of Health   Financial Resource Strain:   . Difficulty of Paying Living Expenses: Not on file  Food Insecurity:   . Worried About Programme researcher, broadcasting/film/videounning Out of Food in the Last Year: Not on file  . Ran Out of Food in the Last Year: Not on file  Transportation Needs:   . Lack of Transportation (Medical): Not on file  . Lack of Transportation (Non-Medical): Not on file  Physical Activity:   . Days of Exercise per Week: Not on file  . Minutes of Exercise per Session: Not on file  Stress:   . Feeling of Stress : Not on file  Social Connections:   . Frequency of  Communication with Friends and Family: Not on file  . Frequency of Social Gatherings with Friends and Family: Not on file  . Attends Religious Services: Not on file  . Active Member of Clubs or Organizations: Not on file  . Attends Archivist Meetings: Not on file  . Marital Status: Not on file  Intimate Partner Violence:   . Fear of Current or Ex-Partner: Not on file  . Emotionally Abused: Not on file  . Physically Abused: Not on file  . Sexually Abused: Not on file    Outpatient Medications Prior to Visit  Medication Sig Dispense Refill  . ergocalciferol (DRISDOL) 1.25 MG (50000 UT) capsule Take 1 capsule (50,000 Units total) by mouth once a week. 30 capsule 1  . folic acid (FOLVITE) 1 MG tablet Take 1 tablet (1 mg total) by mouth daily. 90 tablet 3   . hydroxyurea (HYDREA) 500 MG capsule Take 3 capsules (1,500 mg total) by mouth daily. 90 capsule 2  . ibuprofen (ADVIL,MOTRIN) 600 MG tablet Take 1 tablet (600 mg total) by mouth every 6 (six) hours as needed for moderate pain. 30 tablet 0   No facility-administered medications prior to visit.    No Known Allergies  ROS Review of Systems  Constitutional: Negative for chills and fatigue.  HENT: Negative.   Respiratory: Negative.   Cardiovascular: Negative.  Negative for chest pain.  Gastrointestinal: Negative.   Endocrine: Negative.   Genitourinary: Negative.   Musculoskeletal: Negative.   Allergic/Immunologic: Negative.   Neurological: Negative.   Hematological: Negative.   Psychiatric/Behavioral: Negative.       Objective:    Physical Exam  BP 128/77 (BP Location: Left Arm, Patient Position: Sitting, Cuff Size: Normal)   Pulse 60   Temp (!) 97.1 F (36.2 C) (Oral)   Resp 14   Ht 5\' 5"  (1.651 m)   Wt 150 lb (68 kg)   SpO2 100%   BMI 24.96 kg/m  Wt Readings from Last 3 Encounters:  08/09/19 150 lb (68 kg)  05/11/19 152 lb (68.9 kg) (45 %, Z= -0.13)*  02/04/19 154 lb (69.9 kg) (49 %, Z= -0.02)*   * Growth percentiles are based on CDC (Boys, 2-20 Years) data.    BP 128/77 (BP Location: Left Arm, Patient Position: Sitting, Cuff Size: Normal)   Pulse 60   Temp (!) 97.1 F (36.2 C) (Oral)   Resp 14   Ht 5\' 5"  (1.651 m)   Wt 150 lb (68 kg)   SpO2 100%   BMI 24.96 kg/m   General Appearance:    Alert, cooperative, no distress, appears stated age  Head:    Normocephalic, without obvious abnormality, atraumatic  Eyes:    PERRL, conjunctiva/corneas clear, EOM's intact, fundi    benign, both eyes       Ears:    Normal TM's and external ear canals, both ears  Nose:   Nares normal, septum midline, mucosa normal, no drainage   or sinus tenderness  Throat:   Lips, mucosa, and tongue normal; teeth and gums normal  Neck:   Supple, symmetrical, trachea midline, no  adenopathy;       thyroid:  No enlargement/tenderness/nodules; no carotid   bruit or JVD  Back:     Symmetric, no curvature, ROM normal, no CVA tenderness  Lungs:     Clear to auscultation bilaterally, respirations unlabored  Chest wall:    No tenderness or deformity  Heart:    Regular rate and rhythm, S1 and S2  normal, no murmur, rub   or gallop  Abdomen:     Soft, non-tender, bowel sounds active all four quadrants,    no masses, no organomegaly  Extremities:   Extremities normal, atraumatic, no cyanosis or edema  Pulses:   2+ and symmetric all extremities  Skin:   Skin color, texture, turgor normal, no rashes or lesions  Lymph nodes:   Cervical, supraclavicular, and axillary nodes normal  Neurologic:   CNII-XII intact. Normal strength, sensation and reflexes      throughout    No results found for: TSH Lab Results  Component Value Date   WBC 3.4 05/11/2019   HGB 9.4 (L) 05/11/2019   HCT 27.1 (L) 05/11/2019   MCV 113 (H) 05/11/2019   PLT 314 05/11/2019   Lab Results  Component Value Date   NA 141 05/11/2019   K 4.4 05/11/2019   CO2 23 05/11/2019   GLUCOSE 88 05/11/2019   BUN 6 05/11/2019   CREATININE 0.89 05/11/2019   BILITOT 0.6 05/11/2019   ALKPHOS 93 05/11/2019   AST 18 05/11/2019   ALT 9 05/11/2019   PROT 7.3 05/11/2019   ALBUMIN 4.7 05/11/2019   CALCIUM 9.5 05/11/2019   ANIONGAP 10 11/02/2018   No results found for: CHOL No results found for: HDL No results found for: LDLCALC No results found for: TRIG No results found for: CHOLHDL No results found for: PJAS5K    Assessment & Plan:   Problem List Items Addressed This Visit      Other   Sickle cell disease, type SS (HCC) - Primary   Relevant Orders   Urinalysis Dipstick (Completed)     1. Sickle cell disease, type SS (HCC) Sickle cell disease - Continue Hydrea 1500 mg daily.  We discussed the need for good hydration, monitoring of hydration status, avoidance of heat, cold, stress, and infection  triggers. We discussed the risks and benefits of Hydrea, including bone marrow suppression, the possibility of GI upset, skin ulcers, hair thinning, and teratogenicity. The patient was reminded of the need to seek medical attention of any symptoms of bleeding, anemia, or infection. Continue folic acid 1 mg daily to prevent aplastic bone marrow crises.    Pulmonary evaluation - Patient denies severe recurrent wheezes, shortness of breath with exercise, or persistent cough. If these symptoms develop, pulmonary function tests with spirometry will be ordered, and if abnormal, plan on referral to Pulmonology for further evaluation.  Cardiac - Routine screening for pulmonary hypertension is not recommended.  Eye - High risk of proliferative retinopathy. Annual eye exam with retinal exam recommended to patient.  Immunization status -patient will receive influenza vaccination on today.  All other immunizations up-to-date.  Acute and chronic painful episodes: Patient rarely has sickle cell pain crisis.  He mostly manages pain with ibuprofen.  He does not warrant opiate medications.    Patient is scheduled to follow up in hematology next month.     - Urinalysis Dipstick   Flu vaccine need - Flu Vaccine QUAD 36+ mos IM (Fluarix & Fluzone Quad PF  Follow-up: 6 months for physical exam   [name]  APRN, MSN, FNP-C [facility] [address] [place], [place] [address] [phone]

## 2019-12-25 ENCOUNTER — Other Ambulatory Visit: Payer: Self-pay | Admitting: Family Medicine

## 2019-12-25 DIAGNOSIS — D571 Sickle-cell disease without crisis: Secondary | ICD-10-CM

## 2020-04-11 ENCOUNTER — Encounter (HOSPITAL_COMMUNITY): Payer: Self-pay | Admitting: Emergency Medicine

## 2020-04-11 ENCOUNTER — Other Ambulatory Visit: Payer: Self-pay

## 2020-04-11 ENCOUNTER — Emergency Department (HOSPITAL_COMMUNITY): Payer: Commercial Managed Care - PPO

## 2020-04-11 DIAGNOSIS — I10 Essential (primary) hypertension: Secondary | ICD-10-CM | POA: Diagnosis not present

## 2020-04-11 DIAGNOSIS — M545 Low back pain: Secondary | ICD-10-CM | POA: Insufficient documentation

## 2020-04-11 DIAGNOSIS — M25512 Pain in left shoulder: Secondary | ICD-10-CM | POA: Diagnosis present

## 2020-04-11 DIAGNOSIS — R05 Cough: Secondary | ICD-10-CM | POA: Insufficient documentation

## 2020-04-11 DIAGNOSIS — D5700 Hb-SS disease with crisis, unspecified: Secondary | ICD-10-CM | POA: Diagnosis not present

## 2020-04-11 DIAGNOSIS — Z20822 Contact with and (suspected) exposure to covid-19: Secondary | ICD-10-CM | POA: Diagnosis not present

## 2020-04-11 LAB — RETICULOCYTES
Immature Retic Fract: 35.9 % — ABNORMAL HIGH (ref 2.3–15.9)
RBC.: 3.39 MIL/uL — ABNORMAL LOW (ref 4.22–5.81)
Retic Count, Absolute: 122 10*3/uL (ref 19.0–186.0)
Retic Ct Pct: 3.6 % — ABNORMAL HIGH (ref 0.4–3.1)

## 2020-04-11 LAB — COMPREHENSIVE METABOLIC PANEL
ALT: 16 U/L (ref 0–44)
AST: 20 U/L (ref 15–41)
Albumin: 4.5 g/dL (ref 3.5–5.0)
Alkaline Phosphatase: 71 U/L (ref 38–126)
Anion gap: 9 (ref 5–15)
BUN: 6 mg/dL (ref 6–20)
CO2: 23 mmol/L (ref 22–32)
Calcium: 9.4 mg/dL (ref 8.9–10.3)
Chloride: 106 mmol/L (ref 98–111)
Creatinine, Ser: 0.68 mg/dL (ref 0.61–1.24)
GFR calc Af Amer: >60 mL/min (ref >60)
GFR calc non Af Amer: >60 mL/min (ref >60)
Glucose, Bld: 99 mg/dL (ref 70–99)
Potassium: 4 mmol/L (ref 3.5–5.1)
Sodium: 138 mmol/L (ref 135–145)
Total Bilirubin: 0.9 mg/dL (ref 0.3–1.2)
Total Protein: 7.8 g/dL (ref 6.5–8.1)

## 2020-04-11 LAB — CBC WITH DIFFERENTIAL/PLATELET
Abs Immature Granulocytes: 0.04 10*3/uL (ref 0.00–0.07)
Basophils Absolute: 0.1 10*3/uL (ref 0.0–0.1)
Basophils Relative: 1 %
Eosinophils Absolute: 0.1 10*3/uL (ref 0.0–0.5)
Eosinophils Relative: 2 %
HCT: 34.4 % — ABNORMAL LOW (ref 39.0–52.0)
Hemoglobin: 12.4 g/dL — ABNORMAL LOW (ref 13.0–17.0)
Immature Granulocytes: 0 %
Lymphocytes Relative: 13 %
Lymphs Abs: 1.2 10*3/uL (ref 0.7–4.0)
MCH: 37 pg — ABNORMAL HIGH (ref 26.0–34.0)
MCHC: 36 g/dL (ref 30.0–36.0)
MCV: 102.7 fL — ABNORMAL HIGH (ref 80.0–100.0)
Monocytes Absolute: 0.8 10*3/uL (ref 0.1–1.0)
Monocytes Relative: 9 %
Neutro Abs: 6.9 10*3/uL (ref 1.7–7.7)
Neutrophils Relative %: 75 %
Platelets: 357 10*3/uL (ref 150–400)
RBC: 3.35 MIL/uL — ABNORMAL LOW (ref 4.22–5.81)
RDW: 13.3 % (ref 11.5–15.5)
WBC: 9.1 10*3/uL (ref 4.0–10.5)
nRBC: 0.4 % — ABNORMAL HIGH (ref 0.0–0.2)

## 2020-04-11 MED ORDER — OXYCODONE-ACETAMINOPHEN 5-325 MG PO TABS
1.0000 | ORAL_TABLET | Freq: Once | ORAL | Status: AC
  Administered 2020-04-11: 1 via ORAL
  Filled 2020-04-11: qty 1

## 2020-04-11 MED ORDER — HYDROMORPHONE HCL 1 MG/ML IJ SOLN
0.5000 mg | Freq: Once | INTRAMUSCULAR | Status: AC
  Administered 2020-04-12: 0.5 mg via SUBCUTANEOUS
  Filled 2020-04-11: qty 1

## 2020-04-11 NOTE — ED Triage Notes (Signed)
Patient endorses SCC since Sunday, endorses pain in his lower back and L shoulder. Took ibuprofen at 1200, usually takes oxycodone but he is out.

## 2020-04-12 ENCOUNTER — Ambulatory Visit (INDEPENDENT_AMBULATORY_CARE_PROVIDER_SITE_OTHER): Payer: Commercial Managed Care - PPO | Admitting: Nurse Practitioner

## 2020-04-12 ENCOUNTER — Emergency Department (HOSPITAL_COMMUNITY)
Admission: EM | Admit: 2020-04-12 | Discharge: 2020-04-12 | Disposition: A | Discharge status: Home / Self Care | Payer: Commercial Managed Care - PPO | Attending: Emergency Medicine | Admitting: Emergency Medicine

## 2020-04-12 ENCOUNTER — Encounter: Payer: Self-pay | Admitting: Nurse Practitioner

## 2020-04-12 VITALS — BP 127/76 | HR 94 | Temp 98.8°F | Resp 18 | Ht 65.0 in | Wt 150.0 lb

## 2020-04-12 DIAGNOSIS — Z79891 Long term (current) use of opiate analgesic: Secondary | ICD-10-CM

## 2020-04-12 DIAGNOSIS — D5701 Hb-SS disease with acute chest syndrome: Secondary | ICD-10-CM | POA: Diagnosis not present

## 2020-04-12 DIAGNOSIS — D5700 Hb-SS disease with crisis, unspecified: Secondary | ICD-10-CM

## 2020-04-12 DIAGNOSIS — D571 Sickle-cell disease without crisis: Secondary | ICD-10-CM | POA: Diagnosis not present

## 2020-04-12 LAB — POCT URINALYSIS DIPSTICK
Bilirubin, UA: NEGATIVE
Blood, UA: NEGATIVE
Glucose, UA: NEGATIVE
Ketones, UA: NEGATIVE
Leukocytes, UA: NEGATIVE
Nitrite, UA: NEGATIVE
Protein, UA: POSITIVE — AB
Spec Grav, UA: >=1.03 — AB (ref 1.010–1.025)
Urobilinogen, UA: 1 E.U./dL
pH, UA: 6 (ref 5.0–8.0)

## 2020-04-12 LAB — SARS CORONAVIRUS 2 BY RT PCR (HOSPITAL ORDER, PERFORMED IN ~~LOC~~ HOSPITAL LAB): SARS Coronavirus 2: NEGATIVE

## 2020-04-12 MED ORDER — ERGOCALCIFEROL 1.25 MG (50000 UT) PO CAPS: 50000.0000 [IU] | ORAL_CAPSULE | ORAL | 2 refills | Status: DC

## 2020-04-12 MED ORDER — HYDROMORPHONE HCL 1 MG/ML IJ SOLN
INTRAMUSCULAR | Status: AC
  Filled 2020-04-12: qty 1

## 2020-04-12 MED ORDER — AZITHROMYCIN 250 MG PO TABS: ORAL_TABLET | ORAL | 0 refills | Status: DC

## 2020-04-12 MED ORDER — HYDROXYUREA 500 MG PO CAPS: 1500.0000 mg | ORAL_CAPSULE | Freq: Every day | ORAL | 2 refills | Status: DC

## 2020-04-12 MED ORDER — IBUPROFEN 600 MG PO TABS: 600.0000 mg | ORAL_TABLET | Freq: Four times a day (QID) | ORAL | 2 refills | Status: AC | PRN

## 2020-04-12 MED ORDER — KETOROLAC TROMETHAMINE 30 MG/ML IJ SOLN
INTRAMUSCULAR | Status: AC
  Filled 2020-04-12: qty 1

## 2020-04-12 NOTE — ED Notes (Signed)
Pt ambulated with pulse ox; pt's oxygen level remained 95-97 on room air while ambulating

## 2020-04-12 NOTE — ED Provider Notes (Signed)
[facility]-EMERGENCY DEPT Provider Note   CSN: [id number] Arrival date & time: 04/11/20  1423     History No chief complaint on file.   Jonathan [name] is a 19 y.o. male.  Patient with history of Hb-SS disease to ED with c/o pain in his left shoulder and bilateral feet and low back. No chest pain, fever, congestion. He has a dry cough but no pleuritic pain. No vomiting or diarrhea. He reports his last admission was over one year ago. He is followed at [facility].   The history is provided by the patient. No language interpreter was used.       Past Medical History:  Diagnosis Date  . Acute chest syndrome(517.3) 2013   march 2013, Sept 2013  . Asthma   . Avascular necrosis of bone of left hip (HCC)   . 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 Sickle cell disease with crisis (HCC)   . Sickle cell disease, type SS (HCC)   . Sickle cell pain crisis (HCC) 10/02/2012  . Vaso-occlusive sickle cell crisis (HCC) 11/09/2011    Patient Active Problem List   Diagnosis Date Noted  . Influenza A 10/13/2017  . Sickle cell disease (HCC) 04/27/2017  . Failed hearing screening 03/25/2016  . 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 Avascular necrosis of bone of left hip (HCC) 03/24/2013  . Chronic obstruct airways disease (HCC) 03/15/2013  . Snoring 04/08/2012  . Hypertension 11/11/2011  . Reactive airway disease 11/09/2011  . 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 ADENOIDECTOMY    . SPLENECTOMY    . TONSILLECTOMY    . TONSILLECTOMY AND ADENOIDECTOMY    . TOTAL HIP ARTHROPLASTY Left 03/07/2016  . UMBILICAL HERNIA REPAIR         Family History  Problem Relation Age of Onset  . Hypertension Mother   . Hypertension Maternal Grandmother   . Hypertension Maternal Grandfather   . Asthma Paternal Grandfather   . Diabetes  Paternal Grandfather     Social History   Tobacco Use  . Smoking status: Never Smoker  . Smokeless tobacco: Never Used  Vaping Use  . Vaping Use: Never used  Substance Use Topics  . Alcohol use: No    Alcohol/week: 0.0 standard drinks  . Drug use: No    Home Medications Prior to Admission medications   Medication Sig Start Date End Date Taking? Authorizing Provider  ergocalciferol (DRISDOL) 1.25 MG (50000 UT) capsule Take 1 capsule (50,000 Units total) by mouth once a week. 02/04/19   Mike Gip, FNP  folic acid (FOLVITE) 1 MG tablet Take 1 tablet (1 mg total) by mouth daily. 02/04/19   Mike Gip, FNP  hydroxyurea (HYDREA) 500 MG capsule Take 3 capsules (1,500 mg total) by mouth daily. 02/04/19   Mike Gip, FNP  ibuprofen (ADVIL,MOTRIN) 600 MG tablet Take 1 tablet (600 mg total) by mouth every 6 (six) hours as needed for moderate pain. 12/10/17   Massie Maroon, FNP    Allergies    Patient has no known allergies.  Review of Systems   Review of Systems  Constitutional: Negative for chills and fever.  HENT: Negative.   Respiratory: Negative.   Cardiovascular: Negative.   Gastrointestinal: Negative.   Musculoskeletal:       See HPI.  Skin: Negative.   Neurological: Negative.  Physical Exam Updated Vital Signs BP 117/65   Pulse 82   Temp 98.9 F (37.2 C)   Resp 17   Ht 5\' 5"  (1.651 m)   Wt 68 kg   SpO2 97%   BMI 24.96 kg/m   Physical Exam Vitals and nursing note reviewed.  Constitutional:      Appearance: He is well-developed.  HENT:     Head: Normocephalic.  Cardiovascular:     Rate and Rhythm: Normal rate and regular rhythm.     Heart sounds: No murmur heard.   Pulmonary:     Effort: Pulmonary effort is normal.     Breath sounds: Normal breath sounds. No wheezing, rhonchi or rales.  Abdominal:     General: Bowel sounds are normal.     Palpations: Abdomen is soft.     Tenderness: There is no abdominal tenderness. There is no guarding  or rebound.  Musculoskeletal:        General: No swelling. Normal range of motion.     Cervical back: Normal range of motion and neck supple.  Skin:    General: Skin is warm and dry.     Findings: No rash.  Neurological:     Mental Status: He is alert and oriented to person, place, and time.     ED Results / Procedures / Treatments   Labs (all labs ordered are listed, but only abnormal results are displayed) Labs Reviewed  CBC WITH DIFFERENTIAL/PLATELET - Abnormal; Notable for the following components:      Result Value   RBC 3.35 (*)    Hemoglobin 12.4 (*)    HCT 34.4 (*)    MCV 102.7 (*)    MCH 37.0 (*)    nRBC 0.4 (*)    All other components within normal limits  RETICULOCYTES - Abnormal; Notable for the following components:   Retic Ct Pct 3.6 (*)    RBC. 3.39 (*)    Immature Retic Fract 35.9 (*)    All other components within normal limits  COMPREHENSIVE METABOLIC PANEL   Results for orders placed or performed during the hospital encounter of 04/12/20  Comprehensive metabolic panel  Result Value Ref Range   Sodium 138 135 - 145 mmol/L   Potassium 4.0 3.5 - 5.1 mmol/L   Chloride 106 98 - 111 mmol/L   CO2 23 22 - 32 mmol/L   Glucose, Bld 99 70 - 99 mg/dL   BUN 6 6 - 20 mg/dL   Creatinine, Ser 04/14/20 0.61 - 1.24 mg/dL   Calcium 9.4 8.9 - 5.46 mg/dL   Total Protein 7.8 6.5 - 8.1 g/dL   Albumin 4.5 3.5 - 5.0 g/dL   AST 20 15 - 41 U/L   ALT 16 0 - 44 U/L   Alkaline Phosphatase 71 38 - 126 U/L   Total Bilirubin 0.9 0.3 - 1.2 mg/dL   GFR calc non Af Amer >60 >60 mL/min   GFR calc Af Amer >60 >60 mL/min   Anion gap 9 5 - 15  CBC with Differential  Result Value Ref Range   WBC 9.1 4.0 - 10.5 K/uL   RBC 3.35 (L) 4.22 - 5.81 MIL/uL   Hemoglobin 12.4 (L) 13.0 - 17.0 g/dL   HCT 27.0 (L) 39 - 52 %   MCV 102.7 (H) 80.0 - 100.0 fL   MCH 37.0 (H) 26.0 - 34.0 pg   MCHC 36.0 30.0 - 36.0 g/dL   RDW 35.0 09.3 - 81.8 %   Platelets  357 150 - 400 K/uL   nRBC 0.4 (H) 0.0 - 0.2  %   Neutrophils Relative % 75 %   Neutro Abs 6.9 1.7 - 7.7 K/uL   Lymphocytes Relative 13 %   Lymphs Abs 1.2 0.7 - 4.0 K/uL   Monocytes Relative 9 %   Monocytes Absolute 0.8 0 - 1 K/uL   Eosinophils Relative 2 %   Eosinophils Absolute 0.1 0 - 0 K/uL   Basophils Relative 1 %   Basophils Absolute 0.1 0 - 0 K/uL   Immature Granulocytes 0 %   Abs Immature Granulocytes 0.04 0.00 - 0.07 K/uL  Reticulocytes  Result Value Ref Range   Retic Ct Pct 3.6 (H) 0.4 - 3.1 %   RBC. 3.39 (L) 4.22 - 5.81 MIL/uL   Retic Count, Absolute 122.0 19.0 - 186.0 K/uL   Immature Retic Fract 35.9 (H) 2.3 - 15.9 %    EKG None  Radiology DG Chest 2 View  Result Date: 04/11/2020 CLINICAL DATA:  Dyspnea, sickle cell EXAM: CHEST - 2 VIEW COMPARISON:  2019 FINDINGS: Small patchy opacity overlying the mid right lung. Lungs are otherwise clear. No pleural effusion or pneumothorax. IMPRESSION: Small area of atelectasis/consolidation within the mid right lung. Electronically Signed   By: Guadlupe Spanish M.D.   On: 04/11/2020 15:37    Procedures Procedures (including critical care time)  Medications Ordered in ED Medications  HYDROmorphone (DILAUDID) 1 MG/ML injection (has no administration in time range)  HYDROmorphone (DILAUDID) 1 MG/ML injection (has no administration in time range)  ketorolac (TORADOL) 30 MG/ML injection (has no administration in time range)  oxyCODONE-acetaminophen (PERCOCET/ROXICET) 5-325 MG per tablet 1 tablet (1 tablet Oral Given 04/11/20 1843)  HYDROmorphone (DILAUDID) injection 0.5 mg (0.5 mg Subcutaneous Given 04/12/20 0010)    ED Course  I have reviewed the triage vital signs and the nursing notes.  Pertinent labs & imaging results that were available during my care of the patient were reviewed by me and considered in my medical decision making (see chart for details).    MDM Rules/Calculators/A&P                          Patient with history of sickle cell dz presents with  shoulder, foot and low back pain for several days. He reports a dry cough without fever, SOB, pleuritic pain.  VSS, no fever in the ED, no hypoxia. There is no joint swelling or redness.   Labs show hgb 12.4, no leukocytosis. CXR with right middle lobe opacity. The patient reports a cough. However, there is no SOB, hypoxia, fever. The patient reports he has had acute chest syndrome in the past and does not feel the same currently. He ambulates in the department and maintains a normal oxygen level. With cough and an abnormal CXR, he technically meets criteria for acute chest, but appears clinically stable. He has an already scheduled appointment with the sickle cell clinic later today for close follow up. Discussed strict return precautions. He is felt reliable for return to the ED with any worsening symptoms.   Pain is managed at this time and he reports he is comfortable with discharge home.   Final Clinical Impression(s) / ED Diagnoses Final diagnoses:  None   1. Sickle cell anemia with pain  Rx / DC Orders ED Discharge Orders    None       Elpidio Anis, PA-C 04/12/20 [id number]    Horton, Mayer Masker, MD  04/12/20 0455  

## 2020-04-12 NOTE — Discharge Instructions (Addendum)
Follow up with your doctor later today as scheduled.   Please return to the ED with any shortness of breath, chest pain, painful breathing, fever or uncontrolled pain.

## 2020-04-12 NOTE — Progress Notes (Signed)
[facility] [address] [place], [place]  [address] Phone:  [phone]   Fax:  [phone]   Established Patient Office Visit  Subjective:  Patient ID: Jonathan [name], male    DOB: [birth year]  Age: 19 y.o. MRN: [id number]  CC:  Chief Complaint  Patient presents with   Follow-up    Pt states he went to hospital yesterday for coughing and pain in L shoulder back and both feetX3  Pt states mucus X4 days    HPI Jonathan [name] presents for follow up. 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   Upper Respiratory Infection Patient complains of symptoms of a URI.He is in today with his mother. He has a history of acute chest pain. He did have an xray in the ED however he was not treated. His mother is concern that he condition may change. He he a patient at [facility] however is trying to transition his care to the adult clinic. Symptoms include cough described as productive of yellow sputum and headache described as frontal . Onset of symptoms was 4 days ago, and has been stable since that time. Treatment to date: Coricidin cold and flu. He started [employer] on yesterday.   Past Medical History:  Diagnosis Date   Acute chest syndrome(517.3) 2013   march 2013, Sept 2013   Asthma    Avascular necrosis of bone of left hip (HCC)    Hb-SS disease with vaso-occlusive crisis (HCC) 07/28/2011   Sickle cell disease with crisis (HCC)    Sickle cell disease, type SS (HCC)    Sickle cell pain crisis (HCC) 10/02/2012   Vaso-occlusive sickle cell crisis (HCC) 11/09/2011    Past Surgical History:  Procedure Laterality Date   ADENOIDECTOMY     SPLENECTOMY      TONSILLECTOMY     TONSILLECTOMY AND ADENOIDECTOMY     TOTAL HIP ARTHROPLASTY Left 03/07/2016   UMBILICAL HERNIA REPAIR      Family History  Problem Relation Age of Onset   Hypertension Mother    Hypertension Maternal Grandmother    Hypertension Maternal Grandfather    Asthma Paternal Grandfather    Diabetes Paternal Grandfather     Social History   Socioeconomic History   Marital status: Single    Spouse name: Not on file   Number of children: Not on file   Years of education: Not on file   Highest education level: Not on file  Occupational History   Not on file  Tobacco Use   Smoking status: Never Smoker   Smokeless tobacco: Never Used  Vaping Use   Vaping Use: Never used  Substance and Sexual Activity   Alcohol use: No    Alcohol/week: 0.0 standard drinks   Drug use: No   Sexual activity: Never  Other Topics Concern   Not on file  Social History Narrative   Lives with mother.  No smokers at home      Update:       05/02/16 Lives with Mother only   Social Determinants of Health   Financial Resource Strain:    Difficulty of Paying Living Expenses: Not on file  Food Insecurity:    Worried About Programme researcher, broadcasting/film/video  in the Last Year: Not on file   Ran Out of Food in the Last Year: Not on file  Transportation Needs:    Lack of Transportation (Medical): Not on file   Lack of Transportation (Non-Medical): Not on file  Physical Activity:    Days of Exercise per Week: Not on file   Minutes of Exercise per Session: Not on file  Stress:    Feeling of Stress : Not on file  Social Connections:    Frequency of Communication with Friends and Family: Not on file   Frequency of Social Gatherings with Friends and Family: Not on file   Attends Religious Services: Not on file   Active Member of Clubs or Organizations: Not on file   Attends Banker Meetings: Not on file   Marital Status: Not on file  Intimate Partner Violence:     Fear of Current or Ex-Partner: Not on file   Emotionally Abused: Not on file   Physically Abused: Not on file   Sexually Abused: Not on file    Outpatient Medications Prior to Visit  Medication Sig Dispense Refill   folic acid (FOLVITE) 1 MG tablet Take 1 tablet (1 mg total) by mouth daily. 90 tablet 3   Oxycodone HCl 10 MG TABS Take 10 mg by mouth every 6 (six) hours as needed (pain).      ergocalciferol (DRISDOL) 1.25 MG (50000 UT) capsule Take 1 capsule (50,000 Units total) by mouth once a week. (Patient taking differently: Take 50,000 Units by mouth every Sunday. ) 30 capsule 1   hydroxyurea (HYDREA) 500 MG capsule Take 3 capsules (1,500 mg total) by mouth daily. 90 capsule 2   ibuprofen (ADVIL,MOTRIN) 600 MG tablet Take 1 tablet (600 mg total) by mouth every 6 (six) hours as needed for moderate pain. 30 tablet 0   No facility-administered medications prior to visit.    Allergies  Allergen Reactions   Allergy Medication [Diphenhydramine]     ROS Review of Systems    Objective:    Physical Exam Vitals reviewed.  HENT:     Head: Normocephalic and atraumatic.     Nose: Nose normal.     Mouth/Throat:     Mouth: Mucous membranes are moist.  Cardiovascular:     Rate and Rhythm: Normal rate and regular rhythm.     Pulses: Normal pulses.     Heart sounds: Normal heart sounds.  Pulmonary:     Effort: Pulmonary effort is normal.     Breath sounds: Normal breath sounds.  Abdominal:     Palpations: Abdomen is soft.  Musculoskeletal:        General: Normal range of motion.     Cervical back: Normal range of motion.  Skin:    General: Skin is warm.     Capillary Refill: Capillary refill takes less than 2 seconds.  Neurological:     General: No focal deficit present.     Mental Status: He is alert and oriented to person, place, and time.  Psychiatric:        Mood and Affect: Mood normal.        Behavior: Behavior normal.        Thought Content: Thought  content normal.        Judgment: Judgment normal.     BP 127/76 (BP Location: Left Arm, Patient Position: Sitting, Cuff Size: Normal)    Pulse 94    Temp 98.8 F (37.1 C)    Resp 18  Ht 5\' 5"  (1.651 m)    Wt 150 lb (68 kg)    SpO2 99%    BMI 24.96 kg/m  Wt Readings from Last 3 Encounters:  04/12/20 150 lb (68 kg)  04/11/20 150 lb (68 kg)  08/09/19 150 lb (68 kg)     Health Maintenance Due  Topic Date Due   Hepatitis C Screening  Never done   INFLUENZA VACCINE  03/25/2020    There are no preventive care reminders to display for this patient.  No results found for: TSH Lab Results  Component Value Date   WBC 9.1 04/11/2020   HGB 12.4 (L) 04/11/2020   HCT 34.4 (L) 04/11/2020   MCV 102.7 (H) 04/11/2020   PLT 357 04/11/2020   Lab Results  Component Value Date   NA 138 04/11/2020   K 4.0 04/11/2020   CO2 23 04/11/2020   GLUCOSE 99 04/11/2020   BUN 6 04/11/2020   CREATININE 0.68 04/11/2020   BILITOT 0.9 04/11/2020   ALKPHOS 71 04/11/2020   AST 20 04/11/2020   ALT 16 04/11/2020   PROT 7.8 04/11/2020   ALBUMIN 4.5 04/11/2020   CALCIUM 9.4 04/11/2020   ANIONGAP 9 04/11/2020   No results found for: CHOL No results found for: HDL No results found for: LDLCALC No results found for: TRIG No results found for: CHOLHDL No results found for: [id number]    Assessment & Plan:   Problem List Items Addressed This Visit      Other   Sickle cell disease, type SS (HCC) - Primary Refilled some maintenance medications We discussed the need for good hydration, monitoring of hydration status, avoidance of heat, cold, stress, and infection triggers. We discussed the risks and benefits of Hydrea, including bone marrow suppression, the possibility of GI upset, skin ulcers, hair thinning, and teratogenicity. The patient was reminded of the need to seek medical attention of any symptoms of bleeding, anemia, or infection. Continue folic acid 1 mg daily to prevent aplastic bone marrow  crises. Introduced patient to Day hospital   Relevant Medications   ergocalciferol (DRISDOL) 1.25 MG (50000 UT) capsule   hydroxyurea (HYDREA) 500 MG capsule   ibuprofen (ADVIL) 600 MG tablet   Other Relevant Orders   POCT urinalysis dipstick (Completed)    Other Visit Diagnoses    Chronic prescription opiate use       Relevant Medications   ibuprofen (ADVIL) 600 MG tablet   Acute chest syndrome due to hemoglobin S disease (HCC)    (HISTORY OF) empiric treatment with azithromycin       Meds ordered this encounter  Medications   ergocalciferol (DRISDOL) 1.25 MG (50000 UT) capsule    Sig: Take 1 capsule (50,000 Units total) by mouth once a week.    Dispense:  30 capsule    Refill:  2    Order Specific Question:   Supervising Provider    Answer:   Quentin AngstJEGEDE, OLUGBEMIGA E [[id number]]   hydroxyurea (HYDREA) 500 MG capsule    Sig: Take 3 capsules (1,500 mg total) by mouth daily.    Dispense:  90 capsule    Refill:  2    Order Specific Question:   Supervising Provider    Answer:   Quentin AngstJEGEDE, OLUGBEMIGA E [[id number]]   ibuprofen (ADVIL) 600 MG tablet    Sig: Take 1 tablet (600 mg total) by mouth every 6 (six) hours as needed for moderate pain.    Dispense:  30 tablet    Refill:  2  Order Specific Question:   Supervising Provider    Answer:   Quentin Angst [[id number]]   azithromycin (ZITHROMAX) 250 MG tablet    Sig: 2 tabs on day 1 and then daily x 4    Dispense:  6 tablet    Refill:  0    Order Specific Question:   Supervising Provider    Answer:   Quentin Angst [[id number]]    Follow-up: Return in about 4 weeks (around 05/10/2020).    Barbette Merino, NP

## 2020-04-13 ENCOUNTER — Encounter: Payer: Self-pay | Admitting: Nurse Practitioner

## 2020-04-14 ENCOUNTER — Encounter: Payer: Self-pay | Admitting: Nurse Practitioner

## 2020-04-14 DIAGNOSIS — R809 Proteinuria, unspecified: Secondary | ICD-10-CM | POA: Insufficient documentation

## 2020-05-10 ENCOUNTER — Ambulatory Visit: Payer: Commercial Managed Care - PPO | Admitting: Nurse Practitioner

## 2020-05-29 ENCOUNTER — Ambulatory Visit: Payer: Medicaid Other | Admitting: Family Medicine

## 2020-08-14 ENCOUNTER — Other Ambulatory Visit: Payer: Self-pay | Admitting: Family Medicine

## 2020-08-14 DIAGNOSIS — D571 Sickle-cell disease without crisis: Secondary | ICD-10-CM

## 2020-08-16 ENCOUNTER — Encounter (HOSPITAL_COMMUNITY): Payer: Self-pay | Admitting: Family Medicine

## 2020-08-16 ENCOUNTER — Non-Acute Institutional Stay (HOSPITAL_COMMUNITY)
Admission: AD | Admit: 2020-08-16 | Discharge: 2020-08-16 | Disposition: A | Discharge status: Home / Self Care | Payer: Commercial Managed Care - PPO | Source: Ambulatory Visit | Attending: Internal Medicine | Admitting: Internal Medicine

## 2020-08-16 DIAGNOSIS — Z888 Allergy status to other drugs, medicaments and biological substances status: Secondary | ICD-10-CM | POA: Insufficient documentation

## 2020-08-16 DIAGNOSIS — Z9081 Acquired absence of spleen: Secondary | ICD-10-CM | POA: Diagnosis not present

## 2020-08-16 DIAGNOSIS — M79652 Pain in left thigh: Secondary | ICD-10-CM | POA: Diagnosis not present

## 2020-08-16 DIAGNOSIS — D5700 Hb-SS disease with crisis, unspecified: Secondary | ICD-10-CM | POA: Diagnosis present

## 2020-08-16 DIAGNOSIS — Z96642 Presence of left artificial hip joint: Secondary | ICD-10-CM | POA: Insufficient documentation

## 2020-08-16 LAB — COMPREHENSIVE METABOLIC PANEL
ALT: 15 U/L (ref 0–44)
AST: 17 U/L (ref 15–41)
Albumin: 4.7 g/dL (ref 3.5–5.0)
Alkaline Phosphatase: 75 U/L (ref 38–126)
Anion gap: 12 (ref 5–15)
BUN: 9 mg/dL (ref 6–20)
CO2: 23 mmol/L (ref 22–32)
Calcium: 9.6 mg/dL (ref 8.9–10.3)
Chloride: 105 mmol/L (ref 98–111)
Creatinine, Ser: 0.76 mg/dL (ref 0.61–1.24)
GFR, Estimated: >60 mL/min (ref >60)
Glucose, Bld: 89 mg/dL (ref 70–99)
Potassium: 4 mmol/L (ref 3.5–5.1)
Sodium: 140 mmol/L (ref 135–145)
Total Bilirubin: 1.5 mg/dL — ABNORMAL HIGH (ref 0.3–1.2)
Total Protein: 8.2 g/dL — ABNORMAL HIGH (ref 6.5–8.1)

## 2020-08-16 LAB — CBC WITH DIFFERENTIAL/PLATELET
Abs Immature Granulocytes: 0.02 10*3/uL (ref 0.00–0.07)
Basophils Absolute: 0.1 10*3/uL (ref 0.0–0.1)
Basophils Relative: 1 %
Eosinophils Absolute: 0.1 10*3/uL (ref 0.0–0.5)
Eosinophils Relative: 1 %
HCT: 34.4 % — ABNORMAL LOW (ref 39.0–52.0)
Hemoglobin: 12 g/dL — ABNORMAL LOW (ref 13.0–17.0)
Immature Granulocytes: 0 %
Lymphocytes Relative: 21 %
Lymphs Abs: 2.2 10*3/uL (ref 0.7–4.0)
MCH: 35 pg — ABNORMAL HIGH (ref 26.0–34.0)
MCHC: 34.9 g/dL (ref 30.0–36.0)
MCV: 100.3 fL — ABNORMAL HIGH (ref 80.0–100.0)
Monocytes Absolute: 1.1 10*3/uL — ABNORMAL HIGH (ref 0.1–1.0)
Monocytes Relative: 11 %
Neutro Abs: 6.8 10*3/uL (ref 1.7–7.7)
Neutrophils Relative %: 66 %
Platelets: 261 10*3/uL (ref 150–400)
RBC: 3.43 MIL/uL — ABNORMAL LOW (ref 4.22–5.81)
RDW: 16.3 % — ABNORMAL HIGH (ref 11.5–15.5)
WBC: 10.3 10*3/uL (ref 4.0–10.5)
nRBC: 0.4 % — ABNORMAL HIGH (ref 0.0–0.2)

## 2020-08-16 LAB — RETICULOCYTES
Immature Retic Fract: 34.7 % — ABNORMAL HIGH (ref 2.3–15.9)
RBC.: 3.35 MIL/uL — ABNORMAL LOW (ref 4.22–5.81)
Retic Count, Absolute: 179.9 10*3/uL (ref 19.0–186.0)
Retic Ct Pct: 5.4 % — ABNORMAL HIGH (ref 0.4–3.1)

## 2020-08-16 MED ORDER — NALOXONE HCL 0.4 MG/ML IJ SOLN: 0.4000 mg | INTRAMUSCULAR | Status: DC | PRN

## 2020-08-16 MED ORDER — ACETAMINOPHEN 500 MG PO TABS
1000.0000 mg | ORAL_TABLET | Freq: Once | ORAL | Status: AC
  Administered 2020-08-16: 11:00:00 1000 mg via ORAL
  Filled 2020-08-16: qty 2

## 2020-08-16 MED ORDER — ONDANSETRON HCL 4 MG/2ML IJ SOLN: 4.0000 mg | Freq: Four times a day (QID) | INTRAMUSCULAR | Status: DC | PRN

## 2020-08-16 MED ORDER — KETOROLAC TROMETHAMINE 30 MG/ML IJ SOLN
15.0000 mg | Freq: Once | INTRAMUSCULAR | Status: AC
  Administered 2020-08-16: 11:00:00 15 mg via INTRAVENOUS
  Filled 2020-08-16: qty 1

## 2020-08-16 MED ORDER — SODIUM CHLORIDE 0.45 % IV SOLN: INTRAVENOUS | Status: DC

## 2020-08-16 MED ORDER — HYDROMORPHONE 1 MG/ML IV SOLN
INTRAVENOUS | Status: DC
  Administered 2020-08-16: 30 mg via INTRAVENOUS
  Administered 2020-08-16: 2.6 mg via INTRAVENOUS
  Filled 2020-08-16: qty 30

## 2020-08-16 MED ORDER — SODIUM CHLORIDE 0.9% FLUSH: 9.0000 mL | INTRAVENOUS | Status: DC | PRN

## 2020-08-16 MED ORDER — OXYCODONE HCL 10 MG PO TABS: 10.0000 mg | ORAL_TABLET | Freq: Four times a day (QID) | ORAL | 0 refills | Status: DC | PRN

## 2020-08-16 NOTE — Discharge Instructions (Signed)
Sickle Cell Anemia, Adult  Sickle cell anemia is a condition where your red blood cells are shaped like sickles. Red blood cells carry oxygen through the body. Sickle-shaped cells do not live as long as normal red blood cells. They also clump together and block blood from flowing through the blood vessels. This prevents the body from getting enough oxygen. Sickle cell anemia causes organ damage and pain. It also increases the risk of infection. Follow these instructions at home: Medicines  Take over-the-counter and prescription medicines only as told by your doctor.  If you were prescribed an antibiotic medicine, take it as told by your doctor. Do not stop taking the antibiotic even if you start to feel better.  If you develop a fever, do not take medicines to lower the fever right away. Tell your doctor about the fever. Managing pain, stiffness, and swelling  Try these methods to help with pain: ? Use a heating pad. ? Take a warm bath. ? Distract yourself, such as by watching TV. Eating and drinking  Drink enough fluid to keep your pee (urine) clear or pale yellow. Drink more in hot weather and during exercise.  Limit or avoid alcohol.  Eat a healthy diet. Eat plenty of fruits, vegetables, whole grains, and lean protein.  Take vitamins and supplements as told by your doctor. Traveling  When traveling, keep these with you: ? Your medical information. ? The names of your doctors. ? Your medicines.  If you need to take an airplane, talk to your doctor first. Activity  Rest often.  Avoid exercises that make your heart beat much faster, such as jogging. General instructions  Do not use products that have nicotine or tobacco, such as cigarettes and e-cigarettes. If you need help quitting, ask your doctor.  Consider wearing a medical alert bracelet.  Avoid being in high places (high altitudes), such as mountains.  Avoid very hot or cold temperatures.  Avoid places where the  temperature changes a lot.  Keep all follow-up visits as told by your doctor. This is important. Contact a doctor if:  A joint hurts.  Your feet or hands hurt or swell.  You feel tired (fatigued). Get help right away if:  You have symptoms of infection. These include: ? Fever. ? Chills. ? Being very tired. ? Irritability. ? Poor eating. ? Throwing up (vomiting).  You feel dizzy or faint.  You have new stomach pain, especially on the left side.  You have a an erection (priapism) that lasts more than 4 hours.  You have numbness in your arms or legs.  You have a hard time moving your arms or legs.  You have trouble talking.  You have pain that does not go away when you take medicine.  You are short of breath.  You are breathing fast.  You have a long-term cough.  You have pain in your chest.  You have a bad headache.  You have a stiff neck.  Your stomach looks bloated even though you did not eat much.  Your skin is pale.  You suddenly cannot see well. Summary  Sickle cell anemia is a condition where your red blood cells are shaped like sickles.  Follow your doctor's advice on ways to manage pain, food to eat, activities to do, and steps to take for safe travel.  Get medical help right away if you have any signs of infection, such as a fever. This information is not intended to replace advice given to you by   your health care provider. Make sure you discuss any questions you have with your health care provider. Document Revised: 12/03/2018 Document Reviewed: 09/16/2016 Elsevier Patient Education  2020 [employer]  

## 2020-08-16 NOTE — Progress Notes (Signed)
Patient admitted to the day infusion hospital for sickle cell pain crisis. Initially, patient reported lower back and left thigh pain rated 6/10. For pain management, patient placed on Dilaudid PCA, given Toradol, Tylenol and hydrated with IV fluids. At discharge, patient rated pain at 0/10. Vital signs stable. Discharge instructions given. Patient alert, oriented and ambulatory at discharge.

## 2020-08-16 NOTE — Progress Notes (Signed)
[facility]. Patient reports lower back and left thigh pain rated 6/10. Reports taking Ibuprofen at 6:00 am. Patient is currently out of prescription pain medication. COVID-19 screening done. Patient reports having a cough for the last 2 days but denies all other symptoms and exposures. Denies fever, chest pain, nausea, vomiting, diarrhea, abdominal pain and priapism. Admits to having transportation at discharge without driving self. [place], FNP notified. Patient can come to the day hospital for pain management. Patient advised and expresses an understanding.

## 2020-08-16 NOTE — Discharge Summary (Signed)
[facility] Discharge Summary   Patient ID: Jonathan [name] MRN: [id number] DOB/AGE: 2000-12-10 19 y.o.  Admit date: 08/16/2020 Discharge date: 08/16/2020  Primary Care Physician:  Quentin Angst, MD  Admission Diagnoses:  Active Problems:   Sickle cell pain crisis [facility])   Discharge Medications:  Allergies as of 08/16/2020      Reactions   Allergy Medication [diphenhydramine]       Medication List    STOP taking these medications   azithromycin 250 MG tablet Commonly known as: ZITHROMAX     TAKE these medications   ergocalciferol 1.25 MG (50000 UT) capsule Commonly known as: Drisdol Take 1 capsule (50,000 Units total) by mouth once a week.   folic acid 1 MG tablet Commonly known as: FOLVITE TAKE 1 TABLET(1 MG) BY MOUTH DAILY   hydroxyurea 500 MG capsule Commonly known as: HYDREA Take 3 capsules (1,500 mg total) by mouth daily.   Oxycodone HCl 10 MG Tabs Take 1 tablet (10 mg total) by mouth every 6 (six) hours as needed (pain).        Consults:  None  Significant Diagnostic Studies:  No results found.  History of present illness:  [facility] Course:  [facility] Course: Reviewed all laboratory values, largely consistent with patient's baseline.  Pain managed with IV Dilaudid, full dose.  IV Toradol 15 mg x 1, IV fluids, 0.45% saline at 150 mL/h, and Tylenol 1000 mg x 1. Sickle cell pain crisis resolved.  Patient is not having any pain at this time.  He also states that he does not have pain medications at home.  Sent oxycodone 10 mg #15 to patient's pharmacy.  Reviewed PDMP prior to prescribing opiate medications, no inconsistencies noted. Patient is alert, oriented, and ambulating without assistance.  He will discharge home in a hemodynamically stable condition.  Discharge instructions: Resume all home medications.   Follow up with PCP as previously  scheduled.   Discussed the importance of drinking  64 ounces of water daily, dehydration of red blood cells may lead further sickling.   Avoid all stressors that precipitate sickle cell pain crisis.     The patient was given clear instructions to go to ER or return to medical center if symptoms do not improve, worsen or new problems develop.    Physical Exam at Discharge:  BP 131/75 (BP Location: Left Arm)   Pulse 67   Temp 98.6 F (37 C) (Temporal)   Resp 16   SpO2 95%   Physical Exam Constitutional:      Appearance: Normal appearance.  Eyes:     Pupils: Pupils are equal, round, and reactive to light.  Cardiovascular:     Rate and Rhythm: Normal rate and regular rhythm.     Pulses: Normal pulses.  Pulmonary:     Effort: Pulmonary effort is normal.  Abdominal:     General: Abdomen is flat. Bowel sounds are normal.  Musculoskeletal:        General: Normal range of motion.  Neurological:     General: No focal deficit present.     Mental Status: He is alert. Mental status is at baseline.  Psychiatric:        Mood and Affect: Mood normal.        Thought Content: Thought content normal.        Judgment: Judgment normal.       Disposition at Discharge: Discharge disposition: 01-Home or Self Care  Discharge Orders: Discharge Instructions    Discharge patient   Complete by: As directed    Discharge disposition: 01-Home or Self Care   Discharge patient date: 08/16/2020      Condition at Discharge:   Stable  Time spent on Discharge:  Greater than 30 minutes.  Signed: [name]  APRN, MSN, FNP-C Patient Care [facility] [address] [place], [place] [address] [phone]  08/16/2020, 4:15 PM

## 2020-08-16 NOTE — H&P (Signed)
[facility] History and Physical   Date: 08/16/2020  Patient name: Jonathan [name] Medical record number: [id number] Date of birth: 2000-10-15 Age: 19 y.o. Gender: male PCP: Quentin Angst, MD  Attending physician: No att. providers found  Chief Complaint: Sickle cell pain  History of Present Illness: Jonathan [name] is a 19 year old male with a medical history significant for sickle cell disease that presents with pain to left thigh that is consistent with his previous sickle cell pain crisis.  Patient states that pain intensity has been elevated over the past 24 hours.  He awakened earlier this a.m. with sharp pain to left thigh.  He characterizes pain as intermittent and sharp.  Patient last had ibuprofen this a.m. without sustained relief.  He states that he is out of his home oxycodone that is prescribed by his hematologist at [facility].  Patient rates pain as 6/10.  He attributes pain crisis to changes in weather.  Patient is opiate nave he has very well-controlled sickle cell disease.  Also, patient has infrequent sickle cell pain crisis.  He denies any headache, chest pain, subjective fever, chills, shortness of breath, urinary symptoms, nausea, vomiting, or diarrhea.  No sick contacts, recent travel, or exposure to COVID-19.  Meds: No medications prior to admission.    Allergies: Allergy medication [diphenhydramine] Past Medical History:  Diagnosis Date  . Acute chest syndrome(517.3) 2013   march 2013, Sept 2013  . Asthma   . Avascular necrosis of bone of left hip (HCC)   . 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 Sickle cell disease with crisis (HCC)   . Sickle cell disease, type SS (HCC)   . Sickle cell pain crisis (HCC) 10/02/2012  . Vaso-occlusive sickle cell crisis (HCC) 11/09/2011   Past Surgical History:  Procedure Laterality Date  . ADENOIDECTOMY    . SPLENECTOMY    . TONSILLECTOMY    . TONSILLECTOMY AND ADENOIDECTOMY     . TOTAL HIP ARTHROPLASTY Left 03/07/2016  . UMBILICAL HERNIA REPAIR     Family History  Problem Relation Age of Onset  . Hypertension Mother   . Hypertension Maternal Grandmother   . Hypertension Maternal Grandfather   . Asthma Paternal Grandfather   . Diabetes Paternal Grandfather    Social History   Socioeconomic History  . Marital status: Single    Spouse name: Not on file  . Number of children: Not on file  . Years of education: Not on file  . Highest education level: Not on file  Occupational History  . Not on file  Tobacco Use  . Smoking status: Never Smoker  . Smokeless tobacco: Never Used  Vaping Use  . Vaping Use: Never used  Substance and Sexual Activity  . Alcohol use: No    Alcohol/week: 0.0 standard drinks  . Drug use: No  . Sexual activity: Never  Other Topics Concern  . Not on file  Social History Narrative   Lives with mother.  No smokers at home      Update:       05/02/16 Lives with Mother only   Social Determinants of Health   Financial Resource Strain: Not on file  Food Insecurity: Not on file  Transportation Needs: Not on file  Physical Activity: Not on file  Stress: Not on file  Social Connections: Not on file  Intimate Partner Violence: Not on file   Review of Systems  Constitutional: Negative for chills and fever.  HENT: Negative.   Eyes:  Negative.   Respiratory: Negative.   Cardiovascular: Negative.   Gastrointestinal: Negative.   Genitourinary: Negative.   Musculoskeletal: Positive for back pain and joint pain.  Skin: Negative.   Neurological: Negative.   Psychiatric/Behavioral: Negative.     Physical Exam: Blood pressure 131/75, pulse 67, temperature 98.6 F (37 C), temperature source Temporal, resp. rate 16, SpO2 95 %. Physical Exam Constitutional:      Appearance: Normal appearance.  Eyes:     Pupils: Pupils are equal, round, and reactive to light.  Cardiovascular:     Rate and Rhythm: Normal rate and regular  rhythm.     Pulses: Normal pulses.  Pulmonary:     Effort: Pulmonary effort is normal.  Abdominal:     General: Bowel sounds are normal.  Musculoskeletal:        General: Normal range of motion.  Skin:    General: Skin is warm.  Neurological:     General: No focal deficit present.     Mental Status: He is alert. Mental status is at baseline.  Psychiatric:        Mood and Affect: Mood normal.        Thought Content: Thought content normal.        Judgment: Judgment normal.      Lab results: Results for orders placed or performed during the hospital encounter of 08/16/20 (from the past 24 hour(s))  Comprehensive metabolic panel     Status: Abnormal   Collection Time: 08/16/20 11:19 AM  Result Value Ref Range   Sodium 140 135 - 145 mmol/L   Potassium 4.0 3.5 - 5.1 mmol/L   Chloride 105 98 - 111 mmol/L   CO2 23 22 - 32 mmol/L   Glucose, Bld 89 70 - 99 mg/dL   BUN 9 6 - 20 mg/dL   Creatinine, Ser 8.18 0.61 - 1.24 mg/dL   Calcium 9.6 8.9 - 29.9 mg/dL   Total Protein 8.2 (H) 6.5 - 8.1 g/dL   Albumin 4.7 3.5 - 5.0 g/dL   AST 17 15 - 41 U/L   ALT 15 0 - 44 U/L   Alkaline Phosphatase 75 38 - 126 U/L   Total Bilirubin 1.5 (H) 0.3 - 1.2 mg/dL   GFR, Estimated >37 >16 mL/min   Anion gap 12 5 - 15  CBC WITH DIFFERENTIAL     Status: Abnormal   Collection Time: 08/16/20 11:19 AM  Result Value Ref Range   WBC 10.3 4.0 - 10.5 K/uL   RBC 3.43 (L) 4.22 - 5.81 MIL/uL   Hemoglobin 12.0 (L) 13.0 - 17.0 g/dL   HCT 96.7 (L) 89.3 - 81.0 %   MCV 100.3 (H) 80.0 - 100.0 fL   MCH 35.0 (H) 26.0 - 34.0 pg   MCHC 34.9 30.0 - 36.0 g/dL   RDW 17.5 (H) 10.2 - 58.5 %   Platelets 261 150 - 400 K/uL   nRBC 0.4 (H) 0.0 - 0.2 %   Neutrophils Relative % 66 %   Neutro Abs 6.8 1.7 - 7.7 K/uL   Lymphocytes Relative 21 %   Lymphs Abs 2.2 0.7 - 4.0 K/uL   Monocytes Relative 11 %   Monocytes Absolute 1.1 (H) 0.1 - 1.0 K/uL   Eosinophils Relative 1 %   Eosinophils Absolute 0.1 0.0 - 0.5 K/uL    Basophils Relative 1 %   Basophils Absolute 0.1 0.0 - 0.1 K/uL   Immature Granulocytes 0 %   Abs Immature Granulocytes 0.02 0.00 - 0.07 K/uL  Reticulocytes     Status: Abnormal   Collection Time: 08/16/20 11:20 AM  Result Value Ref Range   Retic Ct Pct 5.4 (H) 0.4 - 3.1 %   RBC. 3.35 (L) 4.22 - 5.81 MIL/uL   Retic Count, Absolute 179.9 19.0 - 186.0 K/uL   Immature Retic Fract 34.7 (H) 2.3 - 15.9 %    Imaging results:  No results found.   Assessment & Plan: Patient admitted to sickle cell day infusion center for management of pain crisis.  Patient is opiate naive Initiate IV dilaudid PCA full dose IV fluids, 0.45% saline at 150 ml/hr Toradol 15 mg IV times one dose Tylenol 1000 mg by mouth times one dose Review CBC with differential, complete metabolic panel, and reticulocytes as results become available. Baseline hemoglobin is Pain intensity will be reevaluated in context of functioning and relationship to baseline as care progresses If pain intensity remains elevated and/or sudden change in hemodynamic stability transition to inpatient services for higher level of care.    [name]  APRN, MSN, FNP-C Patient Care [facility] [address] [place], [place] [address] [phone]   08/16/2020, 4:22 PM

## 2020-08-31 ENCOUNTER — Non-Acute Institutional Stay (HOSPITAL_COMMUNITY)
Admission: AD | Admit: 2020-08-31 | Discharge: 2020-08-31 | Disposition: A | Discharge status: Home / Self Care | Payer: Commercial Managed Care - PPO | Source: Ambulatory Visit | Attending: Internal Medicine | Admitting: Internal Medicine

## 2020-08-31 ENCOUNTER — Telehealth (HOSPITAL_COMMUNITY): Payer: Self-pay | Admitting: General Practice

## 2020-08-31 ENCOUNTER — Encounter (HOSPITAL_COMMUNITY): Payer: Self-pay | Admitting: Family Medicine

## 2020-08-31 DIAGNOSIS — Z96642 Presence of left artificial hip joint: Secondary | ICD-10-CM | POA: Insufficient documentation

## 2020-08-31 DIAGNOSIS — Z888 Allergy status to other drugs, medicaments and biological substances status: Secondary | ICD-10-CM | POA: Diagnosis not present

## 2020-08-31 DIAGNOSIS — D5700 Hb-SS disease with crisis, unspecified: Secondary | ICD-10-CM | POA: Diagnosis not present

## 2020-08-31 DIAGNOSIS — M79604 Pain in right leg: Secondary | ICD-10-CM | POA: Insufficient documentation

## 2020-08-31 DIAGNOSIS — Z79891 Long term (current) use of opiate analgesic: Secondary | ICD-10-CM | POA: Diagnosis not present

## 2020-08-31 DIAGNOSIS — M545 Low back pain, unspecified: Secondary | ICD-10-CM | POA: Diagnosis not present

## 2020-08-31 DIAGNOSIS — M79605 Pain in left leg: Secondary | ICD-10-CM | POA: Diagnosis not present

## 2020-08-31 LAB — CBC WITH DIFFERENTIAL/PLATELET
Abs Immature Granulocytes: 0.01 10*3/uL (ref 0.00–0.07)
Basophils Absolute: 0 10*3/uL (ref 0.0–0.1)
Basophils Relative: 1 %
Eosinophils Absolute: 0 10*3/uL (ref 0.0–0.5)
Eosinophils Relative: 1 %
HCT: 32.2 % — ABNORMAL LOW (ref 39.0–52.0)
Hemoglobin: 11.4 g/dL — ABNORMAL LOW (ref 13.0–17.0)
Immature Granulocytes: 0 %
Lymphocytes Relative: 38 %
Lymphs Abs: 1.7 10*3/uL (ref 0.7–4.0)
MCH: 34.3 pg — ABNORMAL HIGH (ref 26.0–34.0)
MCHC: 35.4 g/dL (ref 30.0–36.0)
MCV: 97 fL (ref 80.0–100.0)
Monocytes Absolute: 0.3 10*3/uL (ref 0.1–1.0)
Monocytes Relative: 7 %
Neutro Abs: 2.3 10*3/uL (ref 1.7–7.7)
Neutrophils Relative %: 53 %
Platelets: 476 10*3/uL — ABNORMAL HIGH (ref 150–400)
RBC: 3.32 MIL/uL — ABNORMAL LOW (ref 4.22–5.81)
RDW: 17.1 % — ABNORMAL HIGH (ref 11.5–15.5)
WBC: 4.4 10*3/uL (ref 4.0–10.5)
nRBC: 17.9 % — ABNORMAL HIGH (ref 0.0–0.2)

## 2020-08-31 LAB — COMPREHENSIVE METABOLIC PANEL
ALT: 22 U/L (ref 0–44)
AST: 31 U/L (ref 15–41)
Albumin: 4.6 g/dL (ref 3.5–5.0)
Alkaline Phosphatase: 66 U/L (ref 38–126)
Anion gap: 11 (ref 5–15)
BUN: 6 mg/dL (ref 6–20)
CO2: 23 mmol/L (ref 22–32)
Calcium: 9.2 mg/dL (ref 8.9–10.3)
Chloride: 104 mmol/L (ref 98–111)
Creatinine, Ser: 0.81 mg/dL (ref 0.61–1.24)
GFR, Estimated: >60 mL/min (ref >60)
Glucose, Bld: 101 mg/dL — ABNORMAL HIGH (ref 70–99)
Potassium: 4.2 mmol/L (ref 3.5–5.1)
Sodium: 138 mmol/L (ref 135–145)
Total Bilirubin: 0.7 mg/dL (ref 0.3–1.2)
Total Protein: 8.1 g/dL (ref 6.5–8.1)

## 2020-08-31 LAB — RETICULOCYTES
Immature Retic Fract: 16.4 % — ABNORMAL HIGH (ref 2.3–15.9)
RBC.: 3.34 MIL/uL — ABNORMAL LOW (ref 4.22–5.81)
Retic Count, Absolute: 102.5 10*3/uL (ref 19.0–186.0)
Retic Ct Pct: 3.1 % (ref 0.4–3.1)

## 2020-08-31 MED ORDER — ONDANSETRON HCL 4 MG/2ML IJ SOLN
4.0000 mg | Freq: Four times a day (QID) | INTRAMUSCULAR | Status: DC | PRN
  Administered 2020-08-31: 4 mg via INTRAVENOUS
  Filled 2020-08-31: qty 2

## 2020-08-31 MED ORDER — KETOROLAC TROMETHAMINE 30 MG/ML IJ SOLN
15.0000 mg | Freq: Once | INTRAMUSCULAR | Status: AC
  Administered 2020-08-31: 15 mg via INTRAVENOUS
  Filled 2020-08-31: qty 1

## 2020-08-31 MED ORDER — OXYCODONE HCL 5 MG PO TABS
5.0000 mg | ORAL_TABLET | ORAL | Status: DC | PRN
  Administered 2020-08-31: 5 mg via ORAL
  Filled 2020-08-31: qty 1

## 2020-08-31 MED ORDER — SODIUM CHLORIDE 0.45 % IV SOLN: INTRAVENOUS | Status: DC

## 2020-08-31 MED ORDER — ACETAMINOPHEN 500 MG PO TABS
1000.0000 mg | ORAL_TABLET | Freq: Once | ORAL | Status: AC
  Administered 2020-08-31: 1000 mg via ORAL
  Filled 2020-08-31: qty 2

## 2020-08-31 MED ORDER — HYDROMORPHONE HCL 1 MG/ML IJ SOLN
1.0000 mg | INTRAMUSCULAR | Status: DC | PRN
  Administered 2020-08-31 (×2): 1 mg via INTRAVENOUS
  Filled 2020-08-31 (×2): qty 1

## 2020-08-31 NOTE — H&P (Signed)
[facility] History and Physical   Date: 08/31/2020  Patient name: Jonathan [name] Medical record number: [id number] Date of birth: 2001/05/20 Age: 20 y.o. Gender: male PCP: Quentin Angst, MD  Attending physician: Quentin Angst, MD  Chief Complaint: Sickle cell pain  History of Present Illness: Jonathan [name] is a 20 year old male with a history of sickle cell disease that presents with complaints of pain to low back and lower extremities for 3 days that is consistent with his previous sickle cell pain crisis.  Patient says that pain has been unrelieved by home medications.  He attributes pain crisis to changes in weather.  Patient rates pain as 8/10 characterized as constant and aching.  He last had oxycodone this a.m. without sustained relief.  He denies any fever, chills, chest pain, urinary symptoms, nausea, vomiting, or diarrhea.   Meds: Medications Prior to Admission  Medication Sig Dispense Refill Last Dose  . ergocalciferol (DRISDOL) 1.25 MG (50000 UT) capsule Take 1 capsule (50,000 Units total) by mouth once a week. 30 capsule 2   . folic acid (FOLVITE) 1 MG tablet TAKE 1 TABLET(1 MG) BY MOUTH DAILY 90 tablet 3   . hydroxyurea (HYDREA) 500 MG capsule Take 3 capsules (1,500 mg total) by mouth daily. 90 capsule 2   . Oxycodone HCl 10 MG TABS Take 1 tablet (10 mg total) by mouth every 6 (six) hours as needed (pain). 15 tablet 0     Allergies: Allergy medication [diphenhydramine] Past Medical History:  Diagnosis Date  . Acute chest syndrome(517.3) 2013   march 2013, Sept 2013  . Asthma   . Avascular necrosis of bone of left hip (HCC)   . 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 Sickle cell disease with crisis (HCC)   . Sickle cell disease, type SS (HCC)   . Sickle cell pain crisis (HCC) 10/02/2012  . Vaso-occlusive sickle cell crisis (HCC) 11/09/2011   Past Surgical History:  Procedure Laterality Date  . ADENOIDECTOMY    .  SPLENECTOMY    . TONSILLECTOMY    . TONSILLECTOMY AND ADENOIDECTOMY    . TOTAL HIP ARTHROPLASTY Left 03/07/2016  . UMBILICAL HERNIA REPAIR     Family History  Problem Relation Age of Onset  . Hypertension Mother   . Hypertension Maternal Grandmother   . Hypertension Maternal Grandfather   . Asthma Paternal Grandfather   . Diabetes Paternal Grandfather    Social History   Socioeconomic History  . Marital status: Single    Spouse name: Not on file  . Number of children: Not on file  . Years of education: Not on file  . Highest education level: Not on file  Occupational History  . Not on file  Tobacco Use  . Smoking status: Never Smoker  . Smokeless tobacco: Never Used  Vaping Use  . Vaping Use: Never used  Substance and Sexual Activity  . Alcohol use: No    Alcohol/week: 0.0 standard drinks  . Drug use: No  . Sexual activity: Never  Other Topics Concern  . Not on file  Social History Narrative   Lives with mother.  No smokers at home      Update:       05/02/16 Lives with Mother only   Social Determinants of Health   Financial Resource Strain: Not on file  Food Insecurity: Not on file  Transportation Needs: Not on file  Physical Activity: Not on file  Stress: Not on file  Social Connections: Not on file  Intimate Partner Violence: Not on file   Review of Systems  Constitutional: Negative for chills and fever.  Eyes: Negative.   Respiratory: Negative.   Cardiovascular: Negative.   Gastrointestinal: Negative.   Genitourinary: Negative.   Musculoskeletal: Positive for back pain and joint pain.  Skin: Negative.   Neurological: Negative.   Psychiatric/Behavioral: Negative.    Physical Exam Constitutional:      Appearance: Normal appearance.  Eyes:     Pupils: Pupils are equal, round, and reactive to light.  Cardiovascular:     Rate and Rhythm: Normal rate and regular rhythm.     Pulses: Normal pulses.  Pulmonary:     Effort: Pulmonary effort is normal.   Abdominal:     General: Abdomen is flat. Bowel sounds are normal.  Musculoskeletal:        General: Normal range of motion.  Skin:    General: Skin is warm.  Neurological:     General: No focal deficit present.     Mental Status: He is alert. Mental status is at baseline.  Psychiatric:        Mood and Affect: Mood normal.        Thought Content: Thought content normal.        Judgment: Judgment normal.     Lab results: No results found for this or any previous visit (from the past 24 hour(s)).  Imaging results:  No results found.   Assessment & Plan: Patient admitted to sickle cell day infusion center for management of pain crisis.  Patient is opiate naive IV Dilaudid every hour as needed x3 IV fluids, 0.45% saline at 150 mL/h Toradol 15 mg IV times one dose Tylenol 1000 mg by mouth times one dose Review CBC with differential, complete metabolic panel, and reticulocytes as results become available. Baseline hemoglobin is 11-12 Pain intensity will be reevaluated in context of functioning and relationship to baseline as care progresses If pain intensity remains elevated and/or sudden change in hemodynamic stability transition to inpatient services for higher level of care.     [name]  APRN, MSN, FNP-C Patient Care [facility] [address] [place], [place] [address] [phone]    08/31/2020, 12:24 PM

## 2020-08-31 NOTE — Discharge Instructions (Signed)
Sickle Cell Anemia, Adult  Sickle cell anemia is a condition where your red blood cells are shaped like sickles. Red blood cells carry oxygen through the body. Sickle-shaped cells do not live as long as normal red blood cells. They also clump together and block blood from flowing through the blood vessels. This prevents the body from getting enough oxygen. Sickle cell anemia causes organ damage and pain. It also increases the risk of infection. Follow these instructions at home: Medicines  Take over-the-counter and prescription medicines only as told by your doctor.  If you were prescribed an antibiotic medicine, take it as told by your doctor. Do not stop taking the antibiotic even if you start to feel better.  If you develop a fever, do not take medicines to lower the fever right away. Tell your doctor about the fever. Managing pain, stiffness, and swelling  Try these methods to help with pain: ? Use a heating pad. ? Take a warm bath. ? Distract yourself, such as by watching TV. Eating and drinking  Drink enough fluid to keep your pee (urine) clear or pale yellow. Drink more in hot weather and during exercise.  Limit or avoid alcohol.  Eat a healthy diet. Eat plenty of fruits, vegetables, whole grains, and lean protein.  Take vitamins and supplements as told by your doctor. Traveling  When traveling, keep these with you: ? Your medical information. ? The names of your doctors. ? Your medicines.  If you need to take an airplane, talk to your doctor first. Activity  Rest often.  Avoid exercises that make your heart beat much faster, such as jogging. General instructions  Do not use products that have nicotine or tobacco, such as cigarettes and e-cigarettes. If you need help quitting, ask your doctor.  Consider wearing a medical alert bracelet.  Avoid being in high places (high altitudes), such as mountains.  Avoid very hot or cold temperatures.  Avoid places where the  temperature changes a lot.  Keep all follow-up visits as told by your doctor. This is important. Contact a doctor if:  A joint hurts.  Your feet or hands hurt or swell.  You feel tired (fatigued). Get help right away if:  You have symptoms of infection. These include: ? Fever. ? Chills. ? Being very tired. ? Irritability. ? Poor eating. ? Throwing up (vomiting).  You feel dizzy or faint.  You have new stomach pain, especially on the left side.  You have a an erection (priapism) that lasts more than 4 hours.  You have numbness in your arms or legs.  You have a hard time moving your arms or legs.  You have trouble talking.  You have pain that does not go away when you take medicine.  You are short of breath.  You are breathing fast.  You have a long-term cough.  You have pain in your chest.  You have a bad headache.  You have a stiff neck.  Your stomach looks bloated even though you did not eat much.  Your skin is pale.  You suddenly cannot see well. Summary  Sickle cell anemia is a condition where your red blood cells are shaped like sickles.  Follow your doctor's advice on ways to manage pain, food to eat, activities to do, and steps to take for safe travel.  Get medical help right away if you have any signs of infection, such as a fever. This information is not intended to replace advice given to you by   your health care provider. Make sure you discuss any questions you have with your health care provider. Document Revised: 12/03/2018 Document Reviewed: 09/16/2016 Elsevier Patient Education  2020 [employer]  

## 2020-08-31 NOTE — Telephone Encounter (Signed)
Patient called, requesting to come to the day hospital due to pain in the legs rated at 7/10. Denied chest pain, fever, diarrhea, abdominal pain, nausea/vomitting and priapism. Screened negative for Covid-19 symptoms. Admitted to having means of transportation without driving self after treatment. Last took 5 mg of oxycodone, at 06:00 am and 600 mg of Ibuprofen at 08:00 am today. Per provider, patient can come to the day hospital for treatment. Patient notified, verbalized understanding.

## 2020-08-31 NOTE — Progress Notes (Signed)
Patient admitted to the day infusion hospital for sickle cell pain. Initially, patient reported bilateral leg pain rated 7/10. For pain management, patient given a total of 2 mg Dilaudid IV, 1000 mg Tylenol, 15 mg Toradol, 5 mg Oxycodone and patient hydrated with IV fluids. At discharge, patient rated pain at 2/10. Vital signs stable. Discharge instructions given. Patient alert, oriented and ambulatory at discharge.

## 2020-08-31 NOTE — Discharge Summary (Signed)
[facility] Discharge Summary   Patient ID: Jonathan [name] MRN: [id number] DOB/AGE: 2000/10/26 20 y.o.  Admit date: 08/31/2020 Discharge date: 08/31/2020  Primary Care Physician:  Quentin Angst, MD  Admission Diagnoses:  Active Problems:   Sickle cell pain crisis [facility])   Discharge Medications:  Allergies as of 08/31/2020      Reactions   Allergy Medication [diphenhydramine]       Medication List    TAKE these medications   ergocalciferol 1.25 MG (50000 UT) capsule Commonly known as: Drisdol Take 1 capsule (50,000 Units total) by mouth once a week.   folic acid 1 MG tablet Commonly known as: FOLVITE TAKE 1 TABLET(1 MG) BY MOUTH DAILY   hydroxyurea 500 MG capsule Commonly known as: HYDREA Take 3 capsules (1,500 mg total) by mouth daily.   Oxycodone HCl 10 MG Tabs Take 1 tablet (10 mg total) by mouth every 6 (six) hours as needed (pain).        Consults:  None  Significant Diagnostic Studies:  No results found.  History of present illness:  Jonathan [name] is a 20 year old male with a history of sickle cell disease that presents with complaints of pain to low back and lower extremities for 3 days that is consistent with his previous sickle cell pain crisis.  Patient says that pain has been unrelieved by home medications.  He attributes pain crisis to changes in weather.  Patient rates pain as 8/10 characterized as constant and aching.  He last had oxycodone this a.m. without sustained relief.  He denies any fever, chills, chest pain, urinary symptoms, nausea, vomiting, or diarrhea. [facility] Course: Patient admitted to sickle cell day infusion clinic for management of pain crisis. Reviewed all laboratory values, largely consistent with patient's baseline. Pain managed with Dilaudid 1 mg IV every hour x3 doses Oxycodone 5 mg x 1 IV fluids, 0.45% saline at 150 mL/h Toradol 15 mg IV x1 Tylenol 1000 mg x 1 Pain intensity decreased to  2/10 and patient is requesting discharge home.  Patient is alert, oriented, and ambulating without assistance.  He will discharge home in a hemodynamically stable condition.  Discharge instructions: Resume all home medications.   Follow up with PCP as previously  scheduled.   Discussed the importance of drinking 64 ounces of water daily, dehydration of red blood cells may lead further sickling.   Avoid all stressors that precipitate sickle cell pain crisis.     The patient was given clear instructions to go to ER or return to medical center if symptoms do not improve, worsen or new problems develop.    Physical Exam at Discharge:  BP 121/68 (BP Location: Left Arm)   Pulse 66   Temp 97.7 F (36.5 C) (Temporal)   Resp 16   SpO2 97%   Physical Exam Constitutional:      Appearance: Normal appearance.  Eyes:     Conjunctiva/sclera: Conjunctivae normal.     Pupils: Pupils are equal, round, and reactive to light.  Cardiovascular:     Rate and Rhythm: Normal rate and regular rhythm.     Pulses: Normal pulses.  Pulmonary:     Effort: Pulmonary effort is normal.  Abdominal:     General: Abdomen is flat. Bowel sounds are normal.  Musculoskeletal:        General: Normal range of motion.  Skin:    General: Skin is warm.  Neurological:     General: No focal deficit present.  Mental Status: He is alert. Mental status is at baseline.  Psychiatric:        Mood and Affect: Mood normal.        Thought Content: Thought content normal.        Judgment: Judgment normal.      Disposition at Discharge: Discharge disposition: 01-Home or Self Care       Discharge Orders: Discharge Instructions    Discharge patient   Complete by: As directed    Discharge disposition: 01-Home or Self Care   Discharge patient date: 08/31/2020      Condition at Discharge:   Stable  Time spent on Discharge:  Greater than 30 minutes.  Signed: [name]  APRN, MSN, FNP-C Patient  Care [facility] [address] [place], [place] [address] [phone]  08/31/2020, 3:58 PM

## 2020-10-16 ENCOUNTER — Telehealth: Payer: Self-pay

## 2020-10-16 NOTE — Telephone Encounter (Signed)
No further action needed.

## 2020-10-23 ENCOUNTER — Other Ambulatory Visit: Payer: Self-pay

## 2020-10-23 ENCOUNTER — Inpatient Hospital Stay (HOSPITAL_COMMUNITY)
Admission: EM | Admit: 2020-10-23 | Discharge: 2020-10-26 | DRG: 812 | Disposition: A | Discharge status: Home / Self Care | Payer: Commercial Managed Care - PPO | Attending: Internal Medicine | Admitting: Internal Medicine

## 2020-10-23 ENCOUNTER — Encounter (HOSPITAL_COMMUNITY): Payer: Self-pay

## 2020-10-23 DIAGNOSIS — Z888 Allergy status to other drugs, medicaments and biological substances status: Secondary | ICD-10-CM | POA: Diagnosis not present

## 2020-10-23 DIAGNOSIS — G894 Chronic pain syndrome: Secondary | ICD-10-CM | POA: Diagnosis present

## 2020-10-23 DIAGNOSIS — Z825 Family history of asthma and other chronic lower respiratory diseases: Secondary | ICD-10-CM | POA: Diagnosis not present

## 2020-10-23 DIAGNOSIS — Z96642 Presence of left artificial hip joint: Secondary | ICD-10-CM

## 2020-10-23 DIAGNOSIS — Z79899 Other long term (current) drug therapy: Secondary | ICD-10-CM | POA: Diagnosis not present

## 2020-10-23 DIAGNOSIS — D72829 Elevated white blood cell count, unspecified: Secondary | ICD-10-CM | POA: Diagnosis present

## 2020-10-23 DIAGNOSIS — I10 Essential (primary) hypertension: Secondary | ICD-10-CM | POA: Diagnosis present

## 2020-10-23 DIAGNOSIS — Z9081 Acquired absence of spleen: Secondary | ICD-10-CM

## 2020-10-23 DIAGNOSIS — D5700 Hb-SS disease with crisis, unspecified: Principal | ICD-10-CM | POA: Diagnosis present

## 2020-10-23 DIAGNOSIS — Z8249 Family history of ischemic heart disease and other diseases of the circulatory system: Secondary | ICD-10-CM | POA: Diagnosis not present

## 2020-10-23 DIAGNOSIS — Z20822 Contact with and (suspected) exposure to covid-19: Secondary | ICD-10-CM | POA: Diagnosis present

## 2020-10-23 DIAGNOSIS — J45909 Unspecified asthma, uncomplicated: Secondary | ICD-10-CM | POA: Diagnosis present

## 2020-10-23 DIAGNOSIS — Z833 Family history of diabetes mellitus: Secondary | ICD-10-CM | POA: Diagnosis not present

## 2020-10-23 LAB — RETICULOCYTES
Immature Retic Fract: 39.6 % — ABNORMAL HIGH (ref 2.3–15.9)
RBC.: 3.6 MIL/uL — ABNORMAL LOW (ref 4.22–5.81)
Retic Count, Absolute: 234 10*3/uL — ABNORMAL HIGH (ref 19.0–186.0)
Retic Ct Pct: 6.5 % — ABNORMAL HIGH (ref 0.4–3.1)

## 2020-10-23 LAB — CBC WITH DIFFERENTIAL/PLATELET
Abs Immature Granulocytes: 0.66 10*3/uL — ABNORMAL HIGH (ref 0.00–0.07)
Abs Immature Granulocytes: 0.72 10*3/uL — ABNORMAL HIGH (ref 0.00–0.07)
Basophils Absolute: 0.1 10*3/uL (ref 0.0–0.1)
Basophils Absolute: 0.1 10*3/uL (ref 0.0–0.1)
Basophils Relative: 1 %
Basophils Relative: 1 %
Eosinophils Absolute: 0 10*3/uL (ref 0.0–0.5)
Eosinophils Absolute: 0 10*3/uL (ref 0.0–0.5)
Eosinophils Relative: 0 %
Eosinophils Relative: 0 %
HCT: 34 % — ABNORMAL LOW (ref 39.0–52.0)
HCT: 32.2 % — ABNORMAL LOW (ref 39.0–52.0)
Hemoglobin: 12 g/dL — ABNORMAL LOW (ref 13.0–17.0)
Hemoglobin: 11 g/dL — ABNORMAL LOW (ref 13.0–17.0)
Immature Granulocytes: 6 %
Immature Granulocytes: 5 %
Lymphocytes Relative: 20 %
Lymphocytes Relative: 23 %
Lymphs Abs: 2.2 10*3/uL (ref 0.7–4.0)
Lymphs Abs: 3.4 10*3/uL (ref 0.7–4.0)
MCH: 33.2 pg (ref 26.0–34.0)
MCH: 32.5 pg (ref 26.0–34.0)
MCHC: 35.3 g/dL (ref 30.0–36.0)
MCHC: 34.2 g/dL (ref 30.0–36.0)
MCV: 94.2 fL (ref 80.0–100.0)
MCV: 95.3 fL (ref 80.0–100.0)
Monocytes Absolute: 0.6 10*3/uL (ref 0.1–1.0)
Monocytes Absolute: 1.1 10*3/uL — ABNORMAL HIGH (ref 0.1–1.0)
Monocytes Relative: 6 %
Monocytes Relative: 8 %
Neutro Abs: 7.1 10*3/uL (ref 1.7–7.7)
Neutro Abs: 9.4 10*3/uL — ABNORMAL HIGH (ref 1.7–7.7)
Neutrophils Relative %: 67 %
Neutrophils Relative %: 63 %
Platelets: 399 10*3/uL (ref 150–400)
Platelets: 294 10*3/uL (ref 150–400)
RBC: 3.61 MIL/uL — ABNORMAL LOW (ref 4.22–5.81)
RBC: 3.38 MIL/uL — ABNORMAL LOW (ref 4.22–5.81)
RDW: 15.1 % (ref 11.5–15.5)
RDW: 15.4 % (ref 11.5–15.5)
WBC: 10.7 10*3/uL — ABNORMAL HIGH (ref 4.0–10.5)
WBC: 14.7 10*3/uL — ABNORMAL HIGH (ref 4.0–10.5)
nRBC: 1 % — ABNORMAL HIGH (ref 0.0–0.2)
nRBC: 2.3 % — ABNORMAL HIGH (ref 0.0–0.2)

## 2020-10-23 LAB — RESP PANEL BY RT-PCR (FLU A&B, COVID) ARPGX2
Influenza A by PCR: NEGATIVE
Influenza B by PCR: NEGATIVE
SARS Coronavirus 2 by RT PCR: NEGATIVE

## 2020-10-23 LAB — COMPREHENSIVE METABOLIC PANEL
ALT: 19 U/L (ref 0–44)
ALT: 17 U/L (ref 0–44)
AST: 31 U/L (ref 15–41)
AST: 28 U/L (ref 15–41)
Albumin: 4.7 g/dL (ref 3.5–5.0)
Albumin: 4.4 g/dL (ref 3.5–5.0)
Alkaline Phosphatase: 75 U/L (ref 38–126)
Alkaline Phosphatase: 75 U/L (ref 38–126)
Anion gap: 11 (ref 5–15)
Anion gap: 10 (ref 5–15)
BUN: 6 mg/dL (ref 6–20)
BUN: 6 mg/dL (ref 6–20)
CO2: 22 mmol/L (ref 22–32)
CO2: 23 mmol/L (ref 22–32)
Calcium: 9.9 mg/dL (ref 8.9–10.3)
Calcium: 9.4 mg/dL (ref 8.9–10.3)
Chloride: 108 mmol/L (ref 98–111)
Chloride: 105 mmol/L (ref 98–111)
Creatinine, Ser: 0.68 mg/dL (ref 0.61–1.24)
Creatinine, Ser: 0.66 mg/dL (ref 0.61–1.24)
GFR, Estimated: >60 mL/min (ref >60)
GFR, Estimated: >60 mL/min (ref >60)
Glucose, Bld: 114 mg/dL — ABNORMAL HIGH (ref 70–99)
Glucose, Bld: 124 mg/dL — ABNORMAL HIGH (ref 70–99)
Potassium: 3.8 mmol/L (ref 3.5–5.1)
Potassium: 3.6 mmol/L (ref 3.5–5.1)
Sodium: 141 mmol/L (ref 135–145)
Sodium: 138 mmol/L (ref 135–145)
Total Bilirubin: 1.7 mg/dL — ABNORMAL HIGH (ref 0.3–1.2)
Total Bilirubin: 1.6 mg/dL — ABNORMAL HIGH (ref 0.3–1.2)
Total Protein: 8 g/dL (ref 6.5–8.1)
Total Protein: 7.6 g/dL (ref 6.5–8.1)

## 2020-10-23 LAB — LACTIC ACID, PLASMA: Lactic Acid, Venous: 0.8 mmol/L (ref 0.5–1.9)

## 2020-10-23 LAB — LACTATE DEHYDROGENASE: LDH: 245 U/L — ABNORMAL HIGH (ref 98–192)

## 2020-10-23 LAB — FERRITIN: Ferritin: 836 ng/mL — ABNORMAL HIGH (ref 24–336)

## 2020-10-23 LAB — D-DIMER, QUANTITATIVE: D-Dimer, Quant: 3.82 ug/mL-FEU — ABNORMAL HIGH (ref 0.00–0.50)

## 2020-10-23 LAB — TRIGLYCERIDES: Triglycerides: 44 mg/dL (ref <150)

## 2020-10-23 LAB — PROCALCITONIN: Procalcitonin: <0.1 ng/mL

## 2020-10-23 LAB — C-REACTIVE PROTEIN: CRP: 1.1 mg/dL — ABNORMAL HIGH (ref <1.0)

## 2020-10-23 LAB — FIBRINOGEN: Fibrinogen: 322 mg/dL (ref 210–475)

## 2020-10-23 MED ORDER — HYDROMORPHONE HCL 2 MG/ML IJ SOLN
2.0000 mg | Freq: Once | INTRAMUSCULAR | Status: AC
  Administered 2020-10-23: 2 mg via INTRAVENOUS
  Filled 2020-10-23: qty 1

## 2020-10-23 MED ORDER — HYDROMORPHONE 1 MG/ML IV SOLN
INTRAVENOUS | Status: DC
Start: 2020-10-24 — End: 2020-10-26
  Administered 2020-10-23 – 2020-10-24 (×2): 30 mg via INTRAVENOUS
  Administered 2020-10-24: 2.5 mg via INTRAVENOUS
  Administered 2020-10-24 (×2): 3.5 mg via INTRAVENOUS
  Administered 2020-10-24: 4.5 mg via INTRAVENOUS
  Administered 2020-10-24: 5 mg via INTRAVENOUS
  Administered 2020-10-24: 7 mg via INTRAVENOUS
  Administered 2020-10-25: 30 mg via INTRAVENOUS
  Administered 2020-10-25: 5.5 mg via INTRAVENOUS
  Administered 2020-10-25: 3 mg via INTRAVENOUS
  Administered 2020-10-25: 2.5 mg via INTRAVENOUS
  Administered 2020-10-25: 7 mg via INTRAVENOUS
  Administered 2020-10-25: 2 mg via INTRAVENOUS
  Administered 2020-10-25: 2.7 mg via INTRAVENOUS
  Administered 2020-10-26: 1 mg via INTRAVENOUS
  Administered 2020-10-26: 5 mg via INTRAVENOUS
  Filled 2020-10-23 (×3): qty 30

## 2020-10-23 MED ORDER — VITAMIN D (ERGOCALCIFEROL) 1.25 MG (50000 UNIT) PO CAPS: 50000.0000 [IU] | ORAL_CAPSULE | ORAL | Status: DC

## 2020-10-23 MED ORDER — DEXTROSE-NACL 5-0.45 % IV SOLN: INTRAVENOUS | Status: DC

## 2020-10-23 MED ORDER — POLYETHYLENE GLYCOL 3350 17 G PO PACK: 17.0000 g | PACK | Freq: Every day | ORAL | Status: DC | PRN

## 2020-10-23 MED ORDER — ONDANSETRON HCL 4 MG/2ML IJ SOLN: 4.0000 mg | Freq: Four times a day (QID) | INTRAMUSCULAR | Status: DC | PRN

## 2020-10-23 MED ORDER — KETOROLAC TROMETHAMINE 15 MG/ML IJ SOLN
15.0000 mg | Freq: Four times a day (QID) | INTRAMUSCULAR | Status: DC
  Administered 2020-10-24 – 2020-10-26 (×10): 15 mg via INTRAVENOUS
  Filled 2020-10-23 (×10): qty 1

## 2020-10-23 MED ORDER — HYDROXYUREA 500 MG PO CAPS: 2000.0000 mg | ORAL_CAPSULE | ORAL | Status: DC

## 2020-10-23 MED ORDER — SENNOSIDES-DOCUSATE SODIUM 8.6-50 MG PO TABS
1.0000 | ORAL_TABLET | Freq: Two times a day (BID) | ORAL | Status: DC
  Administered 2020-10-24 – 2020-10-26 (×5): 1 via ORAL
  Filled 2020-10-23 (×6): qty 1

## 2020-10-23 MED ORDER — KETOROLAC TROMETHAMINE 15 MG/ML IJ SOLN
15.0000 mg | INTRAMUSCULAR | Status: AC
  Administered 2020-10-23: 15 mg via INTRAVENOUS
  Filled 2020-10-23: qty 1

## 2020-10-23 MED ORDER — HYDROMORPHONE HCL 1 MG/ML IJ SOLN
0.5000 mg | INTRAMUSCULAR | Status: AC
  Administered 2020-10-23: 0.5 mg via INTRAVENOUS
  Filled 2020-10-23: qty 1

## 2020-10-23 MED ORDER — FOLIC ACID 1 MG PO TABS
1.0000 mg | ORAL_TABLET | Freq: Every day | ORAL | Status: DC
  Administered 2020-10-24 – 2020-10-26 (×3): 1 mg via ORAL
  Filled 2020-10-23 (×3): qty 1

## 2020-10-23 MED ORDER — MORPHINE SULFATE (PF) 4 MG/ML IV SOLN
4.0000 mg | Freq: Once | INTRAVENOUS | Status: AC
  Administered 2020-10-23: 4 mg via INTRAVENOUS
  Filled 2020-10-23: qty 1

## 2020-10-23 MED ORDER — ONDANSETRON HCL 4 MG/2ML IJ SOLN
4.0000 mg | INTRAMUSCULAR | Status: DC | PRN
  Administered 2020-10-23: 4 mg via INTRAVENOUS
  Filled 2020-10-23: qty 2

## 2020-10-23 MED ORDER — ENOXAPARIN SODIUM 40 MG/0.4ML ~~LOC~~ SOLN
40.0000 mg | SUBCUTANEOUS | Status: DC
  Administered 2020-10-24 – 2020-10-26 (×3): 40 mg via SUBCUTANEOUS
  Filled 2020-10-23 (×2): qty 0.4

## 2020-10-23 MED ORDER — SODIUM CHLORIDE 0.9% FLUSH
9.0000 mL | INTRAVENOUS | Status: DC | PRN
Start: 2020-10-23 — End: 2020-10-26

## 2020-10-23 MED ORDER — HYDROXYUREA 500 MG PO CAPS
2000.0000 mg | ORAL_CAPSULE | ORAL | Status: AC
  Filled 2020-10-23: qty 4

## 2020-10-23 MED ORDER — HYDROXYUREA 500 MG PO CAPS
2000.0000 mg | ORAL_CAPSULE | ORAL | Status: DC
  Administered 2020-10-25: 2000 mg via ORAL
  Filled 2020-10-23: qty 4

## 2020-10-23 MED ORDER — DIPHENHYDRAMINE HCL 25 MG PO CAPS: 25.0000 mg | ORAL_CAPSULE | ORAL | Status: DC | PRN

## 2020-10-23 MED ORDER — HYDROXYUREA 500 MG PO CAPS
1500.0000 mg | ORAL_CAPSULE | ORAL | Status: DC
  Administered 2020-10-24 – 2020-10-26 (×2): 1500 mg via ORAL
  Filled 2020-10-23 (×2): qty 3

## 2020-10-23 MED ORDER — SODIUM CHLORIDE 0.9 % IV SOLN
25.0000 mg | INTRAVENOUS | Status: DC | PRN
  Filled 2020-10-23: qty 0.5

## 2020-10-23 MED ORDER — HYDROMORPHONE HCL 1 MG/ML IJ SOLN
1.0000 mg | INTRAMUSCULAR | Status: AC
  Administered 2020-10-23: 1 mg via INTRAVENOUS
  Filled 2020-10-23: qty 1

## 2020-10-23 MED ORDER — NALOXONE HCL 0.4 MG/ML IJ SOLN: 0.4000 mg | INTRAMUSCULAR | Status: DC | PRN

## 2020-10-23 NOTE — ED Triage Notes (Signed)
Pt presents with c/o sickle cell pain in his back that started today.

## 2020-10-23 NOTE — ED Notes (Signed)
ED TO INPATIENT HANDOFF REPORT  Name/Age/Gender Jonathan [name] 20 y.o. male  Code Status    Code Status Orders  (From admission, onward)         Start     Ordered   10/23/20 2029  Full code  Continuous        10/23/20 2028        Code Status History    Date Active Date Inactive Code Status Order ID Comments User Context   08/31/2020 1223 08/31/2020 2109 Full Code [id number]  Massie Maroon, FNP Inpatient   08/16/2020 1057 08/16/2020 2100 Full Code [id number]  Massie Maroon, FNP Inpatient   11/21/2017 1014 11/23/2017 2002 Full Code [id number]  Quentin Angst, MD Inpatient   10/13/2017 0238 10/14/2017 2030 Full Code [id number]  Hillary Bow, DO ED   04/27/2017 2010 04/29/2017 1805 Full Code [id number]  Hollice Gong, MD ED   04/17/2017 1626 04/24/2017 2042 Full Code [id number]  Kathlen Mody, MD Inpatient   12/02/2016 1608 12/07/2016 1327 Full Code [id number]  Warnell Forester, MD Inpatient   07/19/2016 1616 07/24/2016 1924 Full Code [id number]  Sarita Haver, MD Inpatient   05/02/2016 0134 05/09/2016 2014 Full Code [id number]  Minda Meo, MD ED   10/07/2015 0904 10/10/2015 2037 Full Code [id number]  Earl Lagos, MD ED   04/01/2015 1652 04/09/2015 2039 Full Code [id number]  [place], Katherine Inpatient   01/11/2015 0125 01/14/2015 1515 Full Code [id number]  Keith Rake, MD ED   06/09/2014 0741 06/12/2014 1949 Full Code [id number]  Sherrin Daisy, MD ED   08/05/2013 0646 08/06/2013 1249 Full Code [id number]  [facility] Jewels ED   10/02/2012 0715 10/05/2012 2319 Full Code [id number]  Claudius Sis, MD ED   09/01/2011 0953 09/06/2011 2047 Full Code [id number]  Permar, Barkley Bruns, MD ED   Advance Care Planning Activity      Home/SNF/Other Home  Chief Complaint Sickle cell disease with crisis (HCC) [D57.00]  Level of Care/Admitting Diagnosis ED Disposition    ED Disposition Condition Comment   Admit  Hospital Area: [facility] [[id number]]  Level of Care:  Med-Surg [[id number]]  May admit patient to [facility] or [facility] if equivalent level of care is available:: No  Covid Evaluation: Asymptomatic Screening Protocol (No Symptoms)  Diagnosis: Sickle cell disease with crisis [facility] [address]) [[id number]]  Admitting Physician: Rometta Emery [[id number]]  Attending Physician: Rometta Emery [[id number]]  Estimated length of stay: past midnight tomorrow  Certification:: I certify this patient will need inpatient services for at least 2 midnights       Medical History Past Medical History:  Diagnosis Date  . Acute chest syndrome(517.3) 2013   march 2013, Sept 2013  . Asthma   . Avascular necrosis of bone of left hip (HCC)   . 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 Sickle cell disease with crisis (HCC)   . Sickle cell disease, type SS (HCC)   . Sickle cell pain crisis (HCC) 10/02/2012  . Vaso-occlusive sickle cell crisis (HCC) 11/09/2011    Allergies Allergies  Allergen Reactions  . Allergy Medication [Diphenhydramine]     IV Location/Drains/Wounds Patient Lines/Drains/Airways Status    Active Line/Drains/Airways    Name Placement date Placement time Site Days   Peripheral IV 10/23/20 Right;Posterior Forearm 10/23/20  1557  Forearm  less than 1          Labs/Imaging Results for orders placed or performed during the hospital encounter of 10/23/20 (from the  past 48 hour(s))  Comprehensive metabolic panel     Status: Abnormal   Collection Time: 10/23/20  3:51 PM  Result Value Ref Range   Sodium 141 135 - 145 mmol/L   Potassium 3.8 3.5 - 5.1 mmol/L   Chloride 108 98 - 111 mmol/L   CO2 22 22 - 32 mmol/L   Glucose, Bld 114 (H) 70 - 99 mg/dL    Comment: Glucose reference range applies only to samples taken after fasting for at least 8 hours.   BUN 6 6 - 20 mg/dL   Creatinine, Ser 0.97 0.61 - 1.24 mg/dL   Calcium 9.9 8.9 - 35.3 mg/dL   Total Protein 8.0 6.5 - 8.1 g/dL   Albumin 4.7 3.5 - 5.0 g/dL   AST 31 15 - 41 U/L   ALT 19 0 -  44 U/L   Alkaline Phosphatase 75 38 - 126 U/L   Total Bilirubin 1.7 (H) 0.3 - 1.2 mg/dL   GFR, Estimated >29 >92 mL/min    Comment: (NOTE) Calculated using the CKD-EPI Creatinine Equation (2021)    Anion gap 11 5 - 15    Comment: Performed at [facility] [address], [place], [place] [address]  CBC with Differential     Status: Abnormal   Collection Time: 10/23/20  3:51 PM  Result Value Ref Range   WBC 10.7 (H) 4.0 - 10.5 K/uL   RBC 3.61 (L) 4.22 - 5.81 MIL/uL   Hemoglobin 12.0 (L) 13.0 - 17.0 g/dL   HCT 41.9 (L) 62.2 - 29.7 %   MCV 94.2 80.0 - 100.0 fL   MCH 33.2 26.0 - 34.0 pg   MCHC 35.3 30.0 - 36.0 g/dL   RDW 98.9 21.1 - 94.1 %   Platelets 399 150 - 400 K/uL   nRBC 1.0 (H) 0.0 - 0.2 %   Neutrophils Relative % 67 %   Neutro Abs 7.1 1.7 - 7.7 K/uL   Lymphocytes Relative 20 %   Lymphs Abs 2.2 0.7 - 4.0 K/uL   Monocytes Relative 6 %   Monocytes Absolute 0.6 0.1 - 1.0 K/uL   Eosinophils Relative 0 %   Eosinophils Absolute 0.0 0.0 - 0.5 K/uL   Basophils Relative 1 %   Basophils Absolute 0.1 0.0 - 0.1 K/uL   WBC Morphology MILD LEFT SHIFT (1-5% METAS, OCC MYELO, OCC BANDS)    Immature Granulocytes 6 %   Abs Immature Granulocytes 0.66 (H) 0.00 - 0.07 K/uL   Carollee Massed Bodies PRESENT    Polychromasia PRESENT    Target Cells PRESENT     Comment: Performed at [facility], [address] [place], [place] [address]  Reticulocytes     Status: Abnormal   Collection Time: 10/23/20  3:51 PM  Result Value Ref Range   Retic Ct Pct 6.5 (H) 0.4 - 3.1 %   RBC. 3.60 (L) 4.22 - 5.81 MIL/uL   Retic Count, Absolute 234.0 (H) 19.0 - 186.0 K/uL   Immature Retic Fract 39.6 (H) 2.3 - 15.9 %    Comment: Performed at [facility], [address] [place], [place] [address]  Resp Panel by RT-PCR (Flu A&B, Covid) Nasopharyngeal Swab     Status: None   Collection Time: 10/23/20  9:00 PM   Specimen: Nasopharyngeal Swab;  Nasopharyngeal(NP) swabs in vial transport medium  Result Value Ref Range   SARS Coronavirus 2 by RT PCR NEGATIVE NEGATIVE    Comment: (NOTE) SARS-CoV-2 target nucleic acids  are NOT DETECTED.  The SARS-CoV-2 RNA is generally detectable in upper respiratory specimens during the acute phase of infection. The lowest concentration of SARS-CoV-2 viral copies this assay can detect is 138 copies/mL. A negative result does not preclude SARS-Cov-2 infection and should not be used as the sole basis for treatment or other patient management decisions. A negative result may occur with  improper specimen collection/handling, submission of specimen other than nasopharyngeal swab, presence of viral mutation(s) within the areas targeted by this assay, and inadequate number of viral copies(<138 copies/mL). A negative result must be combined with clinical observations, patient history, and epidemiological information. The expected result is Negative.  Fact Sheet for Patients:  BloggerCourse.comhttps://www.fda.gov/media/152166/download  Fact Sheet for Healthcare Providers:  SeriousBroker.ithttps://www.fda.gov/media/152162/download  This test is no t yet approved or cleared by the [place] FDA and  has been authorized for detection and/or diagnosis of SARS-CoV-2 by FDA under an Emergency Use Authorization (EUA). This EUA will remain  in effect (meaning this test can be used) for the duration of the COVID-19 declaration under Section 564(b)(1) of the Act, 21 U.S.C.section 360bbb-3(b)(1), unless the authorization is terminated  or revoked sooner.       Influenza A by PCR NEGATIVE NEGATIVE   Influenza B by PCR NEGATIVE NEGATIVE    Comment: (NOTE) The Xpert Xpress SARS-CoV-2/FLU/RSV plus assay is intended as an aid in the diagnosis of influenza from Nasopharyngeal swab specimens and should not be used as a sole basis for treatment. Nasal washings and aspirates are unacceptable for Xpert Xpress  SARS-CoV-2/FLU/RSV testing.  Fact Sheet for Patients: BloggerCourse.comhttps://www.fda.gov/media/152166/download  Fact Sheet for Healthcare Providers: SeriousBroker.ithttps://www.fda.gov/media/152162/download  This test is not yet approved or cleared by the [place] FDA and has been authorized for detection and/or diagnosis of SARS-CoV-2 by FDA under an Emergency Use Authorization (EUA). This EUA will remain in effect (meaning this test can be used) for the duration of the COVID-19 declaration under Section 564(b)(1) of the Act, 21 U.S.C. section 360bbb-3(b)(1), unless the authorization is terminated or revoked.  Performed at [facility], [address] [place], [place] [address]   Lactic acid, plasma     Status: None   Collection Time: 10/23/20  9:45 PM  Result Value Ref Range   Lactic Acid, Venous 0.8 0.5 - 1.9 mmol/L    Comment: Performed at [facility], [address] [place], [place] [address]  CBC WITH DIFFERENTIAL     Status: Abnormal   Collection Time: 10/23/20  9:45 PM  Result Value Ref Range   WBC 14.7 (H) 4.0 - 10.5 K/uL   RBC 3.38 (L) 4.22 - 5.81 MIL/uL   Hemoglobin 11.0 (L) 13.0 - 17.0 g/dL   HCT 09.832.2 (L) 11.939.0 - 14.752.0 %   MCV 95.3 80.0 - 100.0 fL   MCH 32.5 26.0 - 34.0 pg   MCHC 34.2 30.0 - 36.0 g/dL   RDW 82.915.4 56.211.5 - 13.015.5 %   Platelets 294 150 - 400 K/uL   nRBC 2.3 (H) 0.0 - 0.2 %   Neutrophils Relative % 63 %   Neutro Abs 9.4 (H) 1.7 - 7.7 K/uL   Lymphocytes Relative 23 %   Lymphs Abs 3.4 0.7 - 4.0 K/uL   Monocytes Relative 8 %   Monocytes Absolute 1.1 (H) 0.1 - 1.0 K/uL   Eosinophils Relative 0 %   Eosinophils Absolute 0.0 0.0 - 0.5 K/uL   Basophils Relative 1 %   Basophils Absolute 0.1 0.0 - 0.1 K/uL  Immature Granulocytes 5 %   Abs Immature Granulocytes 0.72 (H) 0.00 - 0.07 K/uL    Comment: Performed at [facility], [address], [place], [place] [address]  Comprehensive metabolic panel     Status: Abnormal    Collection Time: 10/23/20  9:45 PM  Result Value Ref Range   Sodium 138 135 - 145 mmol/L   Potassium 3.6 3.5 - 5.1 mmol/L   Chloride 105 98 - 111 mmol/L   CO2 23 22 - 32 mmol/L   Glucose, Bld 124 (H) 70 - 99 mg/dL    Comment: Glucose reference range applies only to samples taken after fasting for at least 8 hours.   BUN 6 6 - 20 mg/dL   Creatinine, Ser 5.02 0.61 - 1.24 mg/dL   Calcium 9.4 8.9 - 77.4 mg/dL   Total Protein 7.6 6.5 - 8.1 g/dL   Albumin 4.4 3.5 - 5.0 g/dL   AST 28 15 - 41 U/L   ALT 17 0 - 44 U/L   Alkaline Phosphatase 75 38 - 126 U/L   Total Bilirubin 1.6 (H) 0.3 - 1.2 mg/dL   GFR, Estimated >12 >87 mL/min    Comment: (NOTE) Calculated using the CKD-EPI Creatinine Equation (2021)    Anion gap 10 5 - 15    Comment: Performed at [facility], [address], [place], [place] [address]  Procalcitonin     Status: None   Collection Time: 10/23/20  9:45 PM  Result Value Ref Range   Procalcitonin <0.10 ng/mL    Comment:        Interpretation: PCT (Procalcitonin) <= 0.5 ng/mL: Systemic infection (sepsis) is not likely. Local bacterial infection is possible. (NOTE)       Sepsis PCT Algorithm           Lower Respiratory Tract                                      Infection PCT Algorithm    ----------------------------     ----------------------------         PCT < 0.25 ng/mL                PCT < 0.10 ng/mL          Strongly encourage             Strongly discourage   discontinuation of antibiotics    initiation of antibiotics    ----------------------------     -----------------------------       PCT 0.25 - 0.50 ng/mL            PCT 0.10 - 0.25 ng/mL               OR       >80% decrease in PCT            Discourage initiation of                                            antibiotics      Encourage discontinuation           of antibiotics    ----------------------------     -----------------------------         PCT >= 0.50 ng/mL  PCT  0.26 - 0.50 ng/mL               AND        <80% decrease in PCT             Encourage initiation of                                             antibiotics       Encourage continuation           of antibiotics    ----------------------------     -----------------------------        PCT >= 0.50 ng/mL                  PCT > 0.50 ng/mL               AND         increase in PCT                  Strongly encourage                                      initiation of antibiotics    Strongly encourage escalation           of antibiotics                                     -----------------------------                                           PCT <= 0.25 ng/mL                                                 OR                                        > 80% decrease in PCT                                      Discontinue / Do not initiate                                             antibiotics  Performed at [facility], [address], [place], [place] [address]   Lactate dehydrogenase     Status: Abnormal   Collection Time: 10/23/20  9:45 PM  Result Value Ref Range   LDH 245 (H) 98 - 192 U/L    Comment: Performed at [facility], [address] [place], [place] [address]  Triglycerides     Status: None   Collection Time: 10/23/20  9:45 PM  Result  Value Ref Range   Triglycerides 44 <150 mg/dL    Comment: Performed at [facility], [address], [place], [place] [address]   No results found.  Pending Labs Unresulted Labs (From admission, onward)          Start     Ordered   10/30/20 0500  Creatinine, serum  (enoxaparin (LOVENOX)    CrCl >/= 30 ml/min)  Weekly,   R     Comments: while on enoxaparin therapy    10/23/20 2028   10/24/20 0500  Comprehensive metabolic panel  Tomorrow morning,   R        10/23/20 2028   10/24/20 0500  CBC with Differential/Platelet  Tomorrow morning,   R        10/23/20 2028   10/23/20 2052   Lactic acid, plasma  Now then every 2 hours,   STAT      10/23/20 2052   10/23/20 2052  Blood Culture (routine x 2)  BLOOD CULTURE X 2,   STAT      10/23/20 2052   10/23/20 2052  D-dimer, quantitative  ONCE - STAT,   STAT       Comments: Used for prognosis and bed placement. Do not order CT or V/Q.    10/23/20 2052   10/23/20 2052  Ferritin  Once,   STAT        10/23/20 2052   10/23/20 2052  Fibrinogen  Once,   STAT        10/23/20 2052   10/23/20 2052  C-reactive protein  Once,   STAT        10/23/20 2052   Signed and Held  HIV Antibody (routine testing w rflx)  (HIV Antibody (Routine testing w reflex) panel)  Once,   R        Signed and Held          Vitals/Pain Today's Vitals   10/23/20 1900 10/23/20 1909 10/23/20 1930 10/23/20 2029  BP: (!) 148/85 (!) 148/85 (!) 147/96   Pulse: 81 70 67   Resp: Temp:      TempSrc:      SpO2:  94% 96%   Weight:      Height:      PainSc:    10-Worst pain ever    Isolation Precautions Airborne and Contact precautions  Medications Medications  senna-docusate (Senokot-S) tablet 1 tablet (has no administration in time range)  polyethylene glycol (MIRALAX / GLYCOLAX) packet 17 g (has no administration in time range)  naloxone (NARCAN) injection 0.4 mg (has no administration in time range)    And  sodium chloride flush (NS) 0.9 % injection 9 mL (has no administration in time range)  ondansetron (ZOFRAN) injection 4 mg (has no administration in time range)  diphenhydrAMINE (BENADRYL) capsule 25 mg (has no administration in time range)    Or  diphenhydrAMINE (BENADRYL) 25 mg in sodium chloride 0.9 % 50 mL IVPB (has no administration in time range)  dextrose 5 %-0.45 % sodium chloride infusion ( Intravenous New Bag/Given 10/23/20 2041)  ketorolac (TORADOL) 15 MG/ML injection 15 mg (15 mg Intravenous Given 10/23/20 1628)  HYDROmorphone (DILAUDID) injection 0.5 mg (0.5 mg Intravenous Given 10/23/20 1629)  HYDROmorphone (DILAUDID)  injection 1 mg (1 mg Intravenous Given 10/23/20 1706)  morphine 4 MG/ML injection 4 mg (4 mg Intravenous Given 10/23/20 1859)  HYDROmorphone (DILAUDID) injection 2 mg (2 mg Intravenous Given 3/1/[facility] 2042)  Mobility walks  

## 2020-10-23 NOTE — H&P (Signed)
History and Physical   Jonathan [name] WGY:[id number] DOB: [birth year] DOA: 10/23/2020  Referring MD/NP/PA: Lars Pinks, PA  PCP: Quentin Angst, MD   Outpatient Specialists: [facility]  Patient coming from: Home  Chief Complaint: Sickle cell crisis  HPI: Jonathan [name] is a 20 y.o. 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  Pain is rated as 10 out of 10.  Consistent with his previous sickle cell crisis.  Patient has been on oxycodone and ibuprofen.  He ran out of the oxycodone has been taking ibuprofen.  He came in and received up to 6 mg of IV Dilaudid in the ER with no relief.  Pain is persistent around 9 out of 10 at the moment.  Denied any nausea vomiting or diarrhea.  Denied any shortness of breath.  He is being admitted to the hospital with acute sickle cell crisis..  ED Course: Temperature 98.1 blood pressure 150/117 pulse 102 respirate 21 oxygen sat 94% on room air.  White count 10.7 hemoglobin 12.1 platelets 399.  Chemistry largely within normal.  Patient is reticulocyte count absolute count is 234 with reticulocyte percent of 6.5.  He is being admitted with acute sickle cell crisis.  Review of Systems: As per HPI otherwise 10 point review of systems negative.    Past Medical History:  Diagnosis Date  . Acute chest syndrome(517.3) 2013   march 2013, Sept 2013  . Asthma   . Avascular necrosis of bone of left hip (HCC)   . 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 Sickle cell disease with crisis (HCC)   . Sickle cell disease, type SS (HCC)   . Sickle cell pain crisis (HCC) 10/02/2012  . Vaso-occlusive sickle cell crisis (HCC) 11/09/2011    Past Surgical History:  Procedure Laterality Date  . ADENOIDECTOMY     . SPLENECTOMY    . TONSILLECTOMY    . TONSILLECTOMY AND ADENOIDECTOMY    . TOTAL HIP ARTHROPLASTY Left 03/07/2016  . UMBILICAL HERNIA REPAIR       reports that he has never smoked. He has never used smokeless tobacco. He reports that he does not drink alcohol and does not use drugs.  Allergies  Allergen Reactions  . Allergy Medication [Diphenhydramine]     Family History  Problem Relation Age of Onset  . Hypertension Mother   . Hypertension Maternal Grandmother   . Hypertension Maternal Grandfather   . Asthma Paternal Grandfather   . Diabetes Paternal Grandfather      Prior to Admission medications   Medication Sig Start Date End Date Taking? Authorizing Provider  ergocalciferol (DRISDOL) 1.25 MG (50000 UT) capsule Take 1 capsule (50,000 Units total) by mouth once a week. 04/12/20  Yes Barbette Merino, NP  folic acid (FOLVITE) 1 MG tablet TAKE 1 TABLET(1 MG) BY MOUTH DAILY Patient taking differently: Take 1 mg by mouth daily. 08/14/20  Yes Barbette Merino, NP  hydroxyurea (HYDREA) 500 MG capsule Take 3 capsules (1,500 mg total) by mouth daily. Patient taking differently: Take 1,500-2,000 mg by mouth See admin instructions. Takes 3 and 4 tablets by mouth on alternate days 04/12/20  Yes Barbette Merino, NP  ibuprofen (ADVIL) 600 MG tablet Take 1,200 mg by mouth every 8 (eight) hours as needed for moderate  pain. 04/12/20  Yes [provider]  Oxycodone HCl 10 MG TABS Take 1 tablet (10 mg total) by mouth every 6 (six) hours as needed (pain). 08/16/20  Yes Massie Maroon, FNP    Physical Exam: Vitals:   10/23/20 1832 10/23/20 1900 10/23/20 1909 10/23/20 1930  BP: (!) 157/100 (!) 148/85 (!) 148/85 (!) 147/96  Pulse: 99 81 70 67  Resp: 15 18 20 16   Temp:      TempSrc:      SpO2: 96%  94% 96%  Weight:      Height:          Constitutional: Acutely ill with mild distress Vitals:   10/23/20 1832 10/23/20 1900 10/23/20 1909 10/23/20 1930  BP: (!) 157/100 (!)  148/85 (!) 148/85 (!) 147/96  Pulse: 99 81 70 67  Resp: 15 18 20 16   Temp:      TempSrc:      SpO2: 96%  94% 96%  Weight:      Height:       Eyes: PERRL, lids and conjunctivae normal ENMT: Mucous membranes are dry. Posterior pharynx clear of any exudate or lesions.Normal dentition.  Neck: normal, supple, no masses, no thyromegaly Respiratory: clear to auscultation bilaterally, no wheezing, no crackles. Normal respiratory effort. No accessory muscle use.  Cardiovascular: Sinus tachycardia, no murmurs / rubs / gallops. No extremity edema. 2+ pedal pulses. No carotid bruits.  Abdomen: no tenderness, no masses palpated. No hepatosplenomegaly. Bowel sounds positive.  Musculoskeletal: no clubbing / cyanosis. No joint deformity upper and lower extremities. Good ROM, no contractures. Normal muscle tone.  Diffuse body tenderness Skin: no rashes, lesions, ulcers. No induration Neurologic: CN 2-12 grossly intact. Sensation intact, DTR normal. Strength 5/5 in all 4.  Psychiatric: Normal judgment and insight. Alert and oriented x 3. Normal mood.     Labs on Admission: I have personally reviewed following labs and imaging studies  CBC: Recent Labs  Lab 10/23/20 1551  WBC 10.7*  NEUTROABS 7.1  HGB 12.0*  HCT 34.0*  MCV 94.2  PLT 399   Basic Metabolic Panel: Recent Labs  Lab 10/23/20 1551  NA 141  K 3.8  CL 108  CO2 22  GLUCOSE 114*  BUN 6  CREATININE 0.68  CALCIUM 9.9   GFR: Estimated Creatinine Clearance: 127.1 mL/min (by C-G formula based on SCr of 0.68 mg/dL). Liver Function Tests: Recent Labs  Lab 10/23/20 1551  AST 31  ALT 19  ALKPHOS 75  BILITOT 1.7*  PROT 8.0  ALBUMIN 4.7   No results for input(s): LIPASE, AMYLASE in the last 168 hours. No results for input(s): AMMONIA in the last 168 hours. Coagulation Profile: No results for input(s): INR, PROTIME in the last 168 hours. Cardiac Enzymes: No results for input(s): CKTOTAL, CKMB, CKMBINDEX, TROPONINI in the  last 168 hours. BNP (last 3 results) No results for input(s): PROBNP in the last 8760 hours. HbA1C: No results for input(s): HGBA1C in the last 72 hours. CBG: No results for input(s): GLUCAP in the last 168 hours. Lipid Profile: No results for input(s): CHOL, HDL, LDLCALC, TRIG, CHOLHDL, LDLDIRECT in the last 72 hours. Thyroid Function Tests: No results for input(s): TSH, T4TOTAL, FREET4, T3FREE, THYROIDAB in the last 72 hours. Anemia Panel: Recent Labs    10/23/20 1551  RETICCTPCT 6.5*   Urine analysis:    Component Value Date/Time   COLORURINE YELLOW 10/13/2017 0042   APPEARANCEUR CLEAR 10/13/2017 0042   LABSPEC 1.014 10/13/2017 0042   PHURINE 5.0  10/13/2017 0042   GLUCOSEU NEGATIVE 10/13/2017 0042   HGBUR NEGATIVE 10/13/2017 0042   BILIRUBINUR negative 04/12/2020 1159   KETONESUR negative 11/04/2018 1011   KETONESUR NEGATIVE 10/13/2017 0042   PROTEINUR Positive (A) 04/12/2020 1159   PROTEINUR 30 (A) 10/13/2017 0042   UROBILINOGEN 1.0 04/12/2020 1159   UROBILINOGEN 1.0 04/04/2015 1712   NITRITE Negative 04/12/2020 1159   NITRITE NEGATIVE 10/13/2017 0042   LEUKOCYTESUR Negative 04/12/2020 1159   Sepsis Labs: @LABRCNTIP (procalcitonin:4,lacticidven:4) )No results found for this or any previous visit (from the past 240 hour(s)).   Radiological Exams on Admission: No results found.    Assessment/Plan Principal Problem:   Sickle cell disease with crisis [facility]) Active Problems:   Reactive airway disease   Hypertension   Status post left hip replacement     #1 sickle cell disease with crisis: Patient will be admitted to him medical bed.  Initiated D5 half-normal at 125 cc an hour, Toradol 50 mg every 6 hours, IV Dilaudid PCA as well as supportive care.  Evaluate pain regularly.  #2 history of asthma: No exacerbation.  #3 chronic pain syndrome: We will resume home oxycodone prior to discharge.  #4 essential hypertension: Confirm home regimen and continue.  We  will treat as needed if BP is elevated.   DVT prophylaxis: Lovenox Code Status: Full code Family Communication: No family at bedside Disposition Plan: Home Consults called: None Admission status: Inpatient  Severity of Illness: The appropriate patient status for this patient is INPATIENT. Inpatient status is judged to be reasonable and necessary in order to provide the required intensity of service to ensure the patient's safety. The patient's presenting symptoms, physical exam findings, and initial radiographic and laboratory data in the context of their chronic comorbidities is felt to place them at high risk for further clinical deterioration. Furthermore, it is not anticipated that the patient will be medically stable for discharge from the hospital within 2 midnights of admission. The following factors support the patient status of inpatient.   " The patient's presenting symptoms include pain in back and legs. " The worrisome physical exam findings include generalized tenderness. " The initial radiographic and laboratory data are worrisome because of elevated reticulocyte count. " The chronic co-morbidities include sickle cell disease.   * I certify that at the point of admission it is my clinical judgment that the patient will require inpatient hospital care spanning beyond 2 midnights from the point of admission due to high intensity of service, high risk for further deterioration and high frequency of surveillance required.[facility] MD [facility] Pager [phone]  If 7PM-7AM, please contact night-coverage [email or web address] Password [facility]  10/23/2020, 8:13 PM

## 2020-10-23 NOTE — ED Provider Notes (Signed)
[facility]-EMERGENCY DEPT Provider Note   CSN: [id number] Arrival date & time: 10/23/20  1511     History Chief Complaint  Patient presents with  . Sickle Cell Pain Crisis    Jonathan [name] is a 20 y.o. male.  20 y.o male with a PMH of Sickle cell pain crisis presents to the ED with a chief complaint of BLLE  And back x 1 days. He reports similar symptoms consistent with his sickle cell pain.  He is given hydrocodone to help with pain control, reports he ran out of this medication.  He reports taking ibuprofen yesterday, without much improvement in his symptoms.  He is followed by [facility] sickle cell.  He does report some pain along with shortness of breath due to pain, however states "this does not feel like acute chest ".  He does not endorse any chest pain at this time.  He has not had any fevers, or other infectious signs.  The history is provided by the patient.  Sickle Cell Pain Crisis Location:  Lower extremity and back Severity:  Moderate Onset quality:  Sudden Duration:  1 day Similar to previous crisis episodes: yes   Timing:  Constant Progression:  Waxing and waning Chronicity:  Recurrent Associated symptoms: shortness of breath   Associated symptoms: no chest pain, no fever, no headaches, no nausea, no sore throat and no vomiting        Past Medical History:  Diagnosis Date  . Acute chest syndrome(517.3) 2013   march 2013, Sept 2013  . Asthma   . Avascular necrosis of bone of left hip (HCC)   . 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 Sickle cell disease with crisis (HCC)   . Sickle cell disease, type SS (HCC)   . Sickle cell pain crisis (HCC) 10/02/2012  . Vaso-occlusive sickle cell crisis (HCC) 11/09/2011    Patient Active Problem List   Diagnosis Date Noted  . Sickle cell pain crisis (HCC) 08/16/2020  . Proteinuria 04/14/2020  . Influenza A 10/13/2017  . Sickle cell disease (HCC) 04/27/2017  . Failed hearing  screening 03/25/2016  . 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 Avascular necrosis of bone of left hip (HCC) 03/24/2013  . Chronic obstruct airways disease (HCC) 03/15/2013  . Snoring 04/08/2012  . Hypertension 11/11/2011  . Reactive airway disease 11/09/2011  . 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 ADENOIDECTOMY    . SPLENECTOMY    . TONSILLECTOMY    . TONSILLECTOMY AND ADENOIDECTOMY    . TOTAL HIP ARTHROPLASTY Left 03/07/2016  . UMBILICAL HERNIA REPAIR         Family History  Problem Relation Age of Onset  . Hypertension Mother   . Hypertension Maternal Grandmother   . Hypertension Maternal Grandfather   . Asthma Paternal Grandfather   . Diabetes Paternal Grandfather     Social History   Tobacco Use  . Smoking status: Never Smoker  . Smokeless tobacco: Never Used  Vaping Use  . Vaping Use: Never used  Substance Use Topics  . Alcohol use: No    Alcohol/week: 0.0 standard drinks  . Drug use: No    Home Medications Prior to Admission medications   Medication Sig Start Date End Date Taking? Authorizing Provider  ergocalciferol (DRISDOL) 1.25 MG (50000 UT) capsule Take 1 capsule (50,000 Units total) by mouth once a week. 04/12/20  Yes Barbette Merino, NP  folic acid (FOLVITE) 1 MG tablet TAKE 1 TABLET(1 MG) BY MOUTH DAILY Patient taking differently: Take 1 mg by mouth daily. 08/14/20  Yes Barbette Merino, NP  hydroxyurea (HYDREA) 500 MG capsule Take 3 capsules (1,500 mg total) by mouth daily. Patient taking differently: Take 1,500-2,000 mg by mouth See admin instructions. Takes 3 and 4 tablets by mouth on alternate days 04/12/20  Yes Barbette Merino, NP  ibuprofen (ADVIL) 600 MG tablet Take 1,200 mg by mouth every 8 (eight) hours as needed for moderate pain. 04/12/20  Yes [provider]  Oxycodone HCl 10 MG TABS Take 1 tablet (10 mg total) by mouth every 6  (six) hours as needed (pain). 08/16/20  Yes Massie Maroon, FNP    Allergies    Allergy medication [diphenhydramine]  Review of Systems   Review of Systems  Constitutional: Negative for fever.  HENT: Negative for sore throat.   Respiratory: Positive for shortness of breath.   Cardiovascular: Negative for chest pain.  Gastrointestinal: Negative for abdominal pain, nausea and vomiting.  Genitourinary: Negative for flank pain.  Musculoskeletal: Positive for myalgias.  Neurological: Negative for light-headedness and headaches.  All other systems reviewed and are negative.   Physical Exam Updated Vital Signs BP (!) 148/85   Pulse 70   Temp 98.1 F (36.7 C) (Oral)   Resp 20   Ht 5\' 5"  (1.651 m)   Wt 68 kg   SpO2 94%   BMI 24.96 kg/m   Physical Exam Vitals and nursing note reviewed.  Constitutional:      Appearance: He is ill-appearing.  HENT:     Head: Normocephalic and atraumatic.     Mouth/Throat:     Mouth: Mucous membranes are moist.  Eyes:     Pupils: Pupils are equal, round, and reactive to light.  Cardiovascular:     Rate and Rhythm: Normal rate.  Pulmonary:     Effort: Pulmonary effort is normal.     Breath sounds: No wheezing or rales.  Abdominal:     General: Abdomen is flat.     Tenderness: There is no abdominal tenderness.  Musculoskeletal:     Cervical back: Normal range of motion and neck supple.       Legs:     Comments: TTP along the BLE.   Skin:    General: Skin is warm and dry.  Neurological:     Mental Status: He is alert and oriented to person, place, and time.     ED Results / Procedures / Treatments   Labs (all labs ordered are listed, but only abnormal results are displayed) Labs Reviewed  COMPREHENSIVE METABOLIC PANEL - Abnormal; Notable for the following components:      Result Value   Glucose, Bld 114 (*)    Total Bilirubin 1.7 (*)    All other components within normal limits  CBC WITH DIFFERENTIAL/PLATELET - Abnormal;  Notable for the following components:   WBC 10.7 (*)    RBC 3.61 (*)    Hemoglobin 12.0 (*)    HCT 34.0 (*)    nRBC 1.0 (*)    All other components within normal limits  RETICULOCYTES - Abnormal; Notable for the following components:   Retic Ct Pct 6.5 (*)    RBC. 3.60 (*)    Retic Count, Absolute 234.0 (*)    Immature Retic Fract 39.6 (*)    All other components within normal limits    EKG None  Radiology No results found.  Procedures Procedures   Medications Ordered in ED Medications  ondansetron (ZOFRAN) injection 4 mg (4 mg Intravenous Given 10/23/20 1626)  ketorolac (TORADOL) 15 MG/ML injection 15 mg (15 mg Intravenous Given 10/23/20 1628)  HYDROmorphone (DILAUDID) injection 0.5 mg (0.5 mg Intravenous Given 10/23/20 1629)  HYDROmorphone (DILAUDID) injection 1 mg (1 mg Intravenous Given 10/23/20 1706)  morphine 4 MG/ML injection 4 mg (4 mg Intravenous Given 10/23/20 1859)    ED Course  I have reviewed the triage vital signs and the nursing notes.  Pertinent labs & imaging results that were available during my care of the patient were reviewed by me and considered in my medical decision making (see chart for details).    MDM Rules/Calculators/A&P   Patient with a past medical history of sickle cell currently under the care of [facility] presents to the ED today with sickle cell pain, usually takes hydrocodone for pain control, however has been out of this medication for the past 3 days.  He is approximately on 5 mg 3 times daily, states he attempted to take some ibuprofen without improvement.  He does endorse some shortness of breath, this is "due to the pain that is going on".  He reports no chest pain, does have a prior history of acute chest per patient but states this feels different.  Symptoms are consistent with his ongoing sickle cell.  Vitals are within normal limits, he is satting at 100% on room air.  There is no pain with palpation of his chest.  He denies any chest pain on  today's visit.  Does report he had acute chest in the past but does not feel the pain is like it.  Interpretation of his labs reveal a CMP without any electrolyte normality, creatinine levels within normal limits.  LFTs are unremarkable.  Reticulocytes are slightly elevated but within patient's prior baseline.  CBC with a slight leukocytosis.  Hemoglobin slightly decreased.  He has received 2 rounds of Dilaudid with minimal improvement in pain control.  We discussed further admission to hospital in order to further evaluate pain.  He reports he has had minimal relief.  Patient is requesting morphine medication at this time to help with pain control.  This will be ordered for patient at this time.  Spoke to Dr. Mikeal Hawthorne hospitalist who will admit patient for further management, patient stable for admission.  Portions of this note were generated with Scientist, clinical (histocompatibility and immunogenetics). Dictation errors may occur despite best attempts at proofreading.  Final Clinical Impression(s) / ED Diagnoses Final diagnoses:  Sickle cell pain crisis [facility])    Rx / DC Orders ED Discharge Orders    None       Claude Manges, Cordelia Poche 10/23/20 Arlean Hopping, MD 10/27/20 2151

## 2020-10-24 DIAGNOSIS — D5700 Hb-SS disease with crisis, unspecified: Secondary | ICD-10-CM | POA: Diagnosis not present

## 2020-10-24 LAB — CBC WITH DIFFERENTIAL/PLATELET
Abs Immature Granulocytes: 0.34 10*3/uL — ABNORMAL HIGH (ref 0.00–0.07)
Basophils Absolute: 0.1 10*3/uL (ref 0.0–0.1)
Basophils Relative: 1 %
Eosinophils Absolute: 0.1 10*3/uL (ref 0.0–0.5)
Eosinophils Relative: 1 %
HCT: 31.4 % — ABNORMAL LOW (ref 39.0–52.0)
Hemoglobin: 10.9 g/dL — ABNORMAL LOW (ref 13.0–17.0)
Immature Granulocytes: 2 %
Lymphocytes Relative: 16 %
Lymphs Abs: 2.3 10*3/uL (ref 0.7–4.0)
MCH: 32.5 pg (ref 26.0–34.0)
MCHC: 34.7 g/dL (ref 30.0–36.0)
MCV: 93.7 fL (ref 80.0–100.0)
Monocytes Absolute: 1.4 10*3/uL — ABNORMAL HIGH (ref 0.1–1.0)
Monocytes Relative: 10 %
Neutro Abs: 9.9 10*3/uL — ABNORMAL HIGH (ref 1.7–7.7)
Neutrophils Relative %: 70 %
Platelets: 304 10*3/uL (ref 150–400)
RBC: 3.35 MIL/uL — ABNORMAL LOW (ref 4.22–5.81)
RDW: 15.8 % — ABNORMAL HIGH (ref 11.5–15.5)
WBC: 14.1 10*3/uL — ABNORMAL HIGH (ref 4.0–10.5)
nRBC: 3.4 % — ABNORMAL HIGH (ref 0.0–0.2)

## 2020-10-24 LAB — COMPREHENSIVE METABOLIC PANEL
ALT: 17 U/L (ref 0–44)
AST: 30 U/L (ref 15–41)
Albumin: 4.4 g/dL (ref 3.5–5.0)
Alkaline Phosphatase: 75 U/L (ref 38–126)
Anion gap: 8 (ref 5–15)
BUN: 6 mg/dL (ref 6–20)
CO2: 25 mmol/L (ref 22–32)
Calcium: 9.2 mg/dL (ref 8.9–10.3)
Chloride: 103 mmol/L (ref 98–111)
Creatinine, Ser: 0.68 mg/dL (ref 0.61–1.24)
GFR, Estimated: >60 mL/min (ref >60)
Glucose, Bld: 128 mg/dL — ABNORMAL HIGH (ref 70–99)
Potassium: 3.5 mmol/L (ref 3.5–5.1)
Sodium: 136 mmol/L (ref 135–145)
Total Bilirubin: 2.9 mg/dL — ABNORMAL HIGH (ref 0.3–1.2)
Total Protein: 7.8 g/dL (ref 6.5–8.1)

## 2020-10-24 LAB — HIV ANTIBODY (ROUTINE TESTING W REFLEX): HIV Screen 4th Generation wRfx: NONREACTIVE

## 2020-10-24 MED ORDER — OXYCODONE HCL 5 MG PO TABS
10.0000 mg | ORAL_TABLET | Freq: Four times a day (QID) | ORAL | Status: DC | PRN
  Administered 2020-10-24 – 2020-10-25 (×4): 10 mg via ORAL
  Filled 2020-10-24 (×4): qty 2

## 2020-10-24 NOTE — Progress Notes (Signed)
Subjective: Jonathan [name] is a 20 year old male with a medical history significant for sickle cell disease was admitted for sickle cell pain crisis. He says that pain intensity has improved some overnight.  However, he continues to endorse generalized pain that is consistent with previous sickle cell crisis.  Patient rates pain as 7/10.  He denies any headache, chest pain, urinary symptoms, nausea, vomiting, or diarrhea today.  Objective:  Vital signs in last 24 hours:  Vitals:   10/24/20 0403 10/24/20 0418 10/24/20 0714 10/24/20 1506  BP:  139/89 (!) 144/97 134/82  Pulse:  (!) 108 80 92  Resp: 14  16 15   Temp:  97.6 F (36.4 C) 97.7 F (36.5 C) 98.6 F (37 C)  TempSrc:  Oral Oral Oral  SpO2: 96% 100% 95% 95%  Weight:      Height:        Intake/Output from previous day:   Intake/Output Summary (Last 24 hours) at 10/24/2020 1546 Last data filed at 10/24/2020 1000 Gross per 24 hour  Intake 240 ml  Output 800 ml  Net -560 ml    Physical Exam: General: Alert, awake, oriented x3, in no acute distress.  HEENT: [place]/AT PEERL, EOMI Neck: Trachea midline,  no masses, no thyromegal,y no JVD, no carotid bruit OROPHARYNX:  Moist, No exudate/ erythema/lesions.  Heart: Regular rate and rhythm, without murmurs, rubs, gallops, PMI non-displaced, no heaves or thrills on palpation.  Lungs: Clear to auscultation, no wheezing or rhonchi noted. No increased vocal fremitus resonant to percussion  Abdomen: Soft, nontender, nondistended, positive bowel sounds, no masses no hepatosplenomegaly noted..  Neuro: No focal neurological deficits noted cranial nerves II through XII grossly intact. DTRs 2+ bilaterally upper and lower extremities. Strength 5 out of 5 in bilateral upper and lower extremities. Musculoskeletal: No warm swelling or erythema around joints, no spinal tenderness noted. Psychiatric: Patient alert and oriented x3, good insight and cognition, good recent to remote recall. Lymph node  survey: No cervical axillary or inguinal lymphadenopathy noted.  Lab Results:  Basic Metabolic Panel:    Component Value Date/Time   NA 136 10/24/2020 0506   NA 141 05/11/2019 1348   K 3.5 10/24/2020 0506   CL 103 10/24/2020 0506   CO2 25 10/24/2020 0506   BUN 6 10/24/2020 0506   BUN 6 05/11/2019 1348   CREATININE 0.68 10/24/2020 0506   CREATININE 0.70 05/12/2016 1052   GLUCOSE 128 (H) 10/24/2020 0506   CALCIUM 9.2 10/24/2020 0506   CBC:    Component Value Date/Time   WBC 14.1 (H) 10/24/2020 0506   HGB 10.9 (L) 10/24/2020 0506   HGB 9.4 (L) 05/11/2019 1348   HCT 31.4 (L) 10/24/2020 0506   HCT 27.1 (L) 05/11/2019 1348   PLT 304 10/24/2020 0506   PLT 314 05/11/2019 1348   MCV 93.7 10/24/2020 0506   MCV 113 (H) 05/11/2019 1348   NEUTROABS 9.9 (H) 10/24/2020 0506   NEUTROABS 0.8 (L) 05/11/2019 1348   LYMPHSABS 2.3 10/24/2020 0506   LYMPHSABS 2.4 05/11/2019 1348   MONOABS 1.4 (H) 10/24/2020 0506   EOSABS 0.1 10/24/2020 0506   EOSABS 0.1 05/11/2019 1348   BASOSABS 0.1 10/24/2020 0506   BASOSABS 0.0 05/11/2019 1348    Recent Results (from the past 240 hour(s))  Resp Panel by RT-PCR (Flu A&B, Covid) Nasopharyngeal Swab     Status: None   Collection Time: 10/23/20  9:00 PM   Specimen: Nasopharyngeal Swab; Nasopharyngeal(NP) swabs in vial transport medium  Result Value Ref Range Status  SARS Coronavirus 2 by RT PCR NEGATIVE NEGATIVE Final    Comment: (NOTE) SARS-CoV-2 target nucleic acids are NOT DETECTED.  The SARS-CoV-2 RNA is generally detectable in upper respiratory specimens during the acute phase of infection. The lowest concentration of SARS-CoV-2 viral copies this assay can detect is 138 copies/mL. A negative result does not preclude SARS-Cov-2 infection and should not be used as the sole basis for treatment or other patient management decisions. A negative result may occur with  improper specimen collection/handling, submission of specimen other than  nasopharyngeal swab, presence of viral mutation(s) within the areas targeted by this assay, and inadequate number of viral copies(<138 copies/mL). A negative result must be combined with clinical observations, patient history, and epidemiological information. The expected result is Negative.  Fact Sheet for Patients:  BloggerCourse.com  Fact Sheet for Healthcare Providers:  SeriousBroker.it  This test is no t yet approved or cleared by the [place] FDA and  has been authorized for detection and/or diagnosis of SARS-CoV-2 by FDA under an Emergency Use Authorization (EUA). This EUA will remain  in effect (meaning this test can be used) for the duration of the COVID-19 declaration under Section 564(b)(1) of the Act, 21 U.S.C.section 360bbb-3(b)(1), unless the authorization is terminated  or revoked sooner.       Influenza A by PCR NEGATIVE NEGATIVE Final   Influenza B by PCR NEGATIVE NEGATIVE Final    Comment: (NOTE) The Xpert Xpress SARS-CoV-2/FLU/RSV plus assay is intended as an aid in the diagnosis of influenza from Nasopharyngeal swab specimens and should not be used as a sole basis for treatment. Nasal washings and aspirates are unacceptable for Xpert Xpress SARS-CoV-2/FLU/RSV testing.  Fact Sheet for Patients: BloggerCourse.com  Fact Sheet for Healthcare Providers: SeriousBroker.it  This test is not yet approved or cleared by the [place] FDA and has been authorized for detection and/or diagnosis of SARS-CoV-2 by FDA under an Emergency Use Authorization (EUA). This EUA will remain in effect (meaning this test can be used) for the duration of the COVID-19 declaration under Section 564(b)(1) of the Act, 21 U.S.C. section 360bbb-3(b)(1), unless the authorization is terminated or revoked.  Performed at [facility], [address], [place], [place] [address]   Blood Culture (routine x 2)     Status: None (Preliminary result)   Collection Time: 10/23/20  9:15 PM   Specimen: BLOOD RIGHT FOREARM  Result Value Ref Range Status   Specimen Description   Final    BLOOD RIGHT FOREARM Performed at [facility], [address], [place], [place] [address]    Special Requests   Final    BOTTLES DRAWN AEROBIC AND ANAEROBIC Blood Culture adequate volume Performed at [facility], [address] [place], [place] [address]    Culture   Final    NO GROWTH < 12 HOURS Performed at [facility], [address], [place], [place] [address]    Report Status PENDING  Incomplete  Blood Culture (routine x 2)     Status: None (Preliminary result)   Collection Time: 10/23/20  9:25 PM   Specimen: BLOOD LEFT HAND  Result Value Ref Range Status   Specimen Description   Final    BLOOD LEFT HAND Performed at [facility], [address] [place], [place] [address]    Special Requests   Final    BOTTLES DRAWN AEROBIC AND ANAEROBIC Blood Culture adequate volume Performed at [facility], [address] [place], [place] [address]    Culture   Final    NO GROWTH < 12 HOURS Performed at [facility], [address], [place], [place] [address]    Report Status PENDING  Incomplete    Studies/Results: No results found.  Medications: Scheduled Meds: . enoxaparin (LOVENOX) injection  40 mg Subcutaneous Q24H  . folic acid  1 mg Oral Daily  . HYDROmorphone   Intravenous Q4H  . hydroxyurea  1,500 mg Oral QODAY  . hydroxyurea  2,000 mg Oral NOW  . [START ON 10/25/2020] hydroxyurea  2,000 mg Oral QODAY  . ketorolac  15 mg Intravenous Q6H  . senna-docusate  1 tablet Oral BID  . [START ON 10/28/2020] Vitamin D (Ergocalciferol)  50,000 Units Oral Weekly   Continuous Infusions: . dextrose 5 % and 0.45% NaCl 50 mL/hr at 10/24/20 1223  . diphenhydrAMINE      PRN Meds:.diphenhydrAMINE **OR** diphenhydrAMINE, naloxone **AND** sodium chloride flush, ondansetron (ZOFRAN) IV, oxyCODONE, polyethylene glycol  Consultants:  None  Procedures:  None  Antibiotics:  None  Assessment/Plan: Principal Problem:   Sickle cell disease with crisis (HCC) Active Problems:   Reactive airway disease   Hypertension   Status post left hip replacement   Sickle cell disease with pain crisis: Continue IV Dilaudid PCA, no changes in settings today Decrease IV fluids to D5 0.45% saline at 75 mL/h Toradol 15 mg IV every 6 hours At home oxycodone 10 mg every 6 hours as needed for severe breakthrough pain Monitor vital signs very closely, reevaluate pain scale regularly, and supplemental oxygen as needed.  Chronic pain syndrome: Resume home oxycodone  Sickle cell anemia: Patient's hemoglobin is 10.9, appears to be consistent with his baseline.  No clinical indication for blood transfusion at this time.  Leukocytosis: Mild leukocytosis.  More than likely secondary to sickle cell crisis.  Patient is afebrile without any signs of infection or inflammation.  Continue to follow closely.   Code Status: Full Code Family Communication: N/A Disposition Plan: Not yet ready for discharge.  Discharge plan for 10/25/2020  [name]  APRN, MSN, FNP-C Patient Care [facility] [address] [place], [place] [address] [phone]  If 5PM-8 sections AM, please contact night-coverage.  10/24/2020, 3:46 PM  LOS: 1 day

## 2020-10-25 DIAGNOSIS — D5700 Hb-SS disease with crisis, unspecified: Secondary | ICD-10-CM | POA: Diagnosis not present

## 2020-10-25 NOTE — Progress Notes (Signed)
Subjective: Jonathan [name] is a 20 year old male with a medical history significant for sickle cell disease that was admitted for sickle cell pain crisis. Patient continues to have pain primarily to lower extremities.  He rates pain as 7/10.  He denies any headache, chest pain, urinary symptoms, nausea, vomiting, or diarrhea. Objective:  Vital signs in last 24 hours:  Vitals:   10/25/20 0609 10/25/20 0812 10/25/20 0942 10/25/20 1150  BP:   140/90   Pulse:   (!) 106   Resp: 18 20 18 20   Temp:   99.3 F (37.4 C)   TempSrc:   Oral   SpO2: (!) 48% 98% 96% 96%  Weight:      Height:        Intake/Output from previous day:   Intake/Output Summary (Last 24 hours) at 10/25/2020 1241 Last data filed at 10/25/2020 0813 Gross per 24 hour  Intake 720 ml  Output 2400 ml  Net -1680 ml    Physical Exam: General: Alert, awake, oriented x3, in no acute distress.  HEENT: [place]/AT PEERL, EOMI Neck: Trachea midline,  no masses, no thyromegal,y no JVD, no carotid bruit OROPHARYNX:  Moist, No exudate/ erythema/lesions.  Heart: Regular rate and rhythm, without murmurs, rubs, gallops, PMI non-displaced, no heaves or thrills on palpation.  Lungs: Clear to auscultation, no wheezing or rhonchi noted. No increased vocal fremitus resonant to percussion  Abdomen: Soft, nontender, nondistended, positive bowel sounds, no masses no hepatosplenomegaly noted..  Neuro: No focal neurological deficits noted cranial nerves II through XII grossly intact. DTRs 2+ bilaterally upper and lower extremities. Strength 5 out of 5 in bilateral upper and lower extremities. Musculoskeletal: No warm swelling or erythema around joints, no spinal tenderness noted. Psychiatric: Patient alert and oriented x3, good insight and cognition, good recent to remote recall. Lymph node survey: No cervical axillary or inguinal lymphadenopathy noted.  Lab Results:  Basic Metabolic Panel:    Component Value Date/Time   NA 136 10/24/2020 0506    NA 141 05/11/2019 1348   K 3.5 10/24/2020 0506   CL 103 10/24/2020 0506   CO2 25 10/24/2020 0506   BUN 6 10/24/2020 0506   BUN 6 05/11/2019 1348   CREATININE 0.68 10/24/2020 0506   CREATININE 0.70 05/12/2016 1052   GLUCOSE 128 (H) 10/24/2020 0506   CALCIUM 9.2 10/24/2020 0506   CBC:    Component Value Date/Time   WBC 14.1 (H) 10/24/2020 0506   HGB 10.9 (L) 10/24/2020 0506   HGB 9.4 (L) 05/11/2019 1348   HCT 31.4 (L) 10/24/2020 0506   HCT 27.1 (L) 05/11/2019 1348   PLT 304 10/24/2020 0506   PLT 314 05/11/2019 1348   MCV 93.7 10/24/2020 0506   MCV 113 (H) 05/11/2019 1348   NEUTROABS 9.9 (H) 10/24/2020 0506   NEUTROABS 0.8 (L) 05/11/2019 1348   LYMPHSABS 2.3 10/24/2020 0506   LYMPHSABS 2.4 05/11/2019 1348   MONOABS 1.4 (H) 10/24/2020 0506   EOSABS 0.1 10/24/2020 0506   EOSABS 0.1 05/11/2019 1348   BASOSABS 0.1 10/24/2020 0506   BASOSABS 0.0 05/11/2019 1348    Recent Results (from the past 240 hour(s))  Resp Panel by RT-PCR (Flu A&B, Covid) Nasopharyngeal Swab     Status: None   Collection Time: 10/23/20  9:00 PM   Specimen: Nasopharyngeal Swab; Nasopharyngeal(NP) swabs in vial transport medium  Result Value Ref Range Status   SARS Coronavirus 2 by RT PCR NEGATIVE NEGATIVE Final    Comment: (NOTE) SARS-CoV-2 target nucleic acids are NOT DETECTED.  The SARS-CoV-2 RNA is generally detectable in upper respiratory specimens during the acute phase of infection. The lowest concentration of SARS-CoV-2 viral copies this assay can detect is 138 copies/mL. A negative result does not preclude SARS-Cov-2 infection and should not be used as the sole basis for treatment or other patient management decisions. A negative result may occur with  improper specimen collection/handling, submission of specimen other than nasopharyngeal swab, presence of viral mutation(s) within the areas targeted by this assay, and inadequate number of viral copies(<138 copies/mL). A negative result must  be combined with clinical observations, patient history, and epidemiological information. The expected result is Negative.  Fact Sheet for Patients:  BloggerCourse.com  Fact Sheet for Healthcare Providers:  SeriousBroker.it  This test is no t yet approved or cleared by the [place] FDA and  has been authorized for detection and/or diagnosis of SARS-CoV-2 by FDA under an Emergency Use Authorization (EUA). This EUA will remain  in effect (meaning this test can be used) for the duration of the COVID-19 declaration under Section 564(b)(1) of the Act, 21 U.S.C.section 360bbb-3(b)(1), unless the authorization is terminated  or revoked sooner.       Influenza A by PCR NEGATIVE NEGATIVE Final   Influenza B by PCR NEGATIVE NEGATIVE Final    Comment: (NOTE) The Xpert Xpress SARS-CoV-2/FLU/RSV plus assay is intended as an aid in the diagnosis of influenza from Nasopharyngeal swab specimens and should not be used as a sole basis for treatment. Nasal washings and aspirates are unacceptable for Xpert Xpress SARS-CoV-2/FLU/RSV testing.  Fact Sheet for Patients: BloggerCourse.com  Fact Sheet for Healthcare Providers: SeriousBroker.it  This test is not yet approved or cleared by the [place] FDA and has been authorized for detection and/or diagnosis of SARS-CoV-2 by FDA under an Emergency Use Authorization (EUA). This EUA will remain in effect (meaning this test can be used) for the duration of the COVID-19 declaration under Section 564(b)(1) of the Act, 21 U.S.C. section 360bbb-3(b)(1), unless the authorization is terminated or revoked.  Performed at [facility], [address] [place], [place] [address]   Blood Culture (routine x 2)     Status: None (Preliminary result)   Collection Time: 10/23/20  9:15 PM   Specimen: BLOOD RIGHT FOREARM  Result Value Ref  Range Status   Specimen Description   Final    BLOOD RIGHT FOREARM Performed at [facility], [address] [place], [place] [address]    Special Requests   Final    BOTTLES DRAWN AEROBIC AND ANAEROBIC Blood Culture adequate volume Performed at [facility], [address], [place], [place] [address]    Culture   Final    NO GROWTH < 12 HOURS Performed at [facility], [address] [place], [place] [address]    Report Status PENDING  Incomplete  Blood Culture (routine x 2)     Status: None (Preliminary result)   Collection Time: 10/23/20  9:25 PM   Specimen: BLOOD LEFT HAND  Result Value Ref Range Status   Specimen Description   Final    BLOOD LEFT HAND Performed at [facility], [address] [place], [place] [address]    Special Requests   Final    BOTTLES DRAWN AEROBIC AND ANAEROBIC Blood Culture adequate volume Performed at [facility], [address] [place], [place] [address]    Culture   Final    NO GROWTH < 12 HOURS Performed  at [facility], [address] [place], [place] [address]    Report Status PENDING  Incomplete    Studies/Results: No results found.  Medications: Scheduled Meds: . enoxaparin (LOVENOX) injection  40 mg Subcutaneous Q24H  . folic acid  1 mg Oral Daily  . HYDROmorphone   Intravenous Q4H  . hydroxyurea  1,500 mg Oral QODAY  . hydroxyurea  2,000 mg Oral QODAY  . ketorolac  15 mg Intravenous Q6H  . senna-docusate  1 tablet Oral BID  . [START ON 10/28/2020] Vitamin D (Ergocalciferol)  50,000 Units Oral Weekly   Continuous Infusions: . dextrose 5 % and 0.45% NaCl 50 mL/hr at 10/25/20 1149  . diphenhydrAMINE     PRN Meds:.diphenhydrAMINE **OR** diphenhydrAMINE, naloxone **AND** sodium chloride flush, ondansetron (ZOFRAN) IV, oxyCODONE, polyethylene  glycol  Consultants:  None  Procedures:  None  Antibiotics:  None  Assessment/Plan: Principal Problem:   Sickle cell disease with crisis (HCC) Active Problems:   Reactive airway disease   Hypertension   Status post left hip replacement  Sickle cell disease with pain crisis: Continue IV Dilaudid PCA without any changes today Decrease IV fluids to KVO Continue Toradol 15 mg IV every 6 hours Continue oxycodone 10 mg every 6 hours as needed for severe breakthrough pain Monitor vital signs very closely, reevaluate pain scale regularly, supplemental oxygen as needed.  Chronic pain syndrome: Resume home medications  Sickle cell anemia: Patient's hemoglobin is stable and consistent with his baseline.  There is no clinical indication for blood transfusion at this time.  Continue to follow closely.  Leukocytosis: Mild leukocytosis.  Stable.  More than likely secondary to sickle cell crisis.  Patient is afebrile without any signs of infection or inflammation.  Continue to follow closely.   Code Status: Full Code Family Communication: N/A Disposition Plan: Not yet ready for discharge  Jonathan [name] Rennis Petty  APRN, MSN, FNP-C Patient [facility] [address] [place], [place] [address] [phone]  If 5PM-8AM, please contact night-coverage.  10/25/2020, 12:41 PM  LOS: 2 days

## 2020-10-25 NOTE — Progress Notes (Signed)
   10/25/20 1248  Assess: MEWS Score  Temp 99.7 F (37.6 C)  BP 129/89  Pulse Rate (!) 117  Resp 20  SpO2 95 %  Assess: MEWS Score  MEWS Temp 0  MEWS Systolic 0  MEWS Pulse 2  MEWS RR 0  MEWS LOC 0  MEWS Score 2  MEWS Score Color Yellow  Assess: if the MEWS score is Yellow or Red  Were vital signs taken at a resting state? Yes  Focused Assessment No change from prior assessment  Early Detection of Sepsis Score *See Row Information* Low  MEWS guidelines implemented *See Row Information* Yes  Take Vital Signs  Increase Vital Sign Frequency  Yellow: Q 2hr X 2 then Q 4hr X 2, if remains yellow, continue Q 4hrs  Escalate  MEWS: Escalate Yellow: discuss with charge nurse/RN and consider discussing with provider and RRT  Notify: Charge Nurse/RN  Name of Charge Nurse/RN Notified self  Date Charge Nurse/RN Notified 10/25/20  Time Charge Nurse/RN Notified 1300  Notify: Provider  Provider Name/Title Arm[place] Hollis  Date Provider Notified 10/25/20  Time Provider Notified 1305  Notification Type Page  Notification Reason Change in status  Provider response No new orders  Date of Provider Response 10/25/20  Time of Provider Response 1332  Document  Patient Outcome Other (Comment) (patient given Incentive Spirometer to use, improved)  Progress note created (see row info) Yes

## 2020-10-26 ENCOUNTER — Encounter: Payer: Self-pay | Admitting: Family Medicine

## 2020-10-26 LAB — CBC
HCT: 30.8 % — ABNORMAL LOW (ref 39.0–52.0)
Hemoglobin: 10.7 g/dL — ABNORMAL LOW (ref 13.0–17.0)
MCH: 32.6 pg (ref 26.0–34.0)
MCHC: 34.7 g/dL (ref 30.0–36.0)
MCV: 93.9 fL (ref 80.0–100.0)
Platelets: 262 10*3/uL (ref 150–400)
RBC: 3.28 MIL/uL — ABNORMAL LOW (ref 4.22–5.81)
RDW: 14.9 % (ref 11.5–15.5)
WBC: 13.1 10*3/uL — ABNORMAL HIGH (ref 4.0–10.5)
nRBC: 2.5 % — ABNORMAL HIGH (ref 0.0–0.2)

## 2020-10-26 MED ORDER — OXYCODONE HCL 10 MG PO TABS: 10.0000 mg | ORAL_TABLET | Freq: Four times a day (QID) | ORAL | 0 refills | Status: DC | PRN

## 2020-10-26 NOTE — Discharge Summary (Signed)
Physician Discharge Summary  Jonathan [name] YJE:[id number] DOB: [birth year] DOA: 10/23/2020  PCP: Quentin Angst, MD  Admit date: 10/23/2020  Discharge date: 10/26/2020  Discharge Diagnoses:  Principal Problem:   Sickle cell disease with crisis [facility]) Active Problems:   Reactive airway disease   Hypertension   Status post left hip replacement   Discharge Condition: Stable  Disposition:  Pt is discharged home in good condition and is to follow up with Quentin Angst, MD this week to have labs evaluated. 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 Pain is rated 10/10 and is consistent with previous sickle cell pain crises.  Patient has been taking oxycodone and ibuprofen previously, but has ran out of home oxycodone.  Patient presented to the ER and received up to 6 mg of IV Dilaudid without relief.  Pain is persistent and 9/10 at the moment.  He denied any nausea, vomiting, or diarrhea.  Patient denies shortness of breath.  He is admitted to inpatient services for further management of acute sickle cell pain crisis.  ER course: Temperature 98.1 F, blood pressure 150/117, pulse 102, respirations 21, oxygen saturation 94% on RA.  WBC count 10.7, hemoglobin 12.1, platelets 399.  Mistry panel largely within normal limits.  Patient's reticulocyte count is 234 with reticulocyte percent of 6.5.  Patient admitted for further management of sickle cell  pain crisis.  Hospital Course:  Sickle cell disease with pain crisis: Patient was admitted for sickle cell pain crisis and managed appropriately with IVF, IV Dilaudid via PCA and IV Toradol, as well as other adjunct therapies per sickle cell pain management protocols.  IV Dilaudid PCA weaned appropriately.  Patient pain intensity decreased to 3/10 and he is requesting discharge home.  To complete treatment, oxycodone 10 mg every 6 hours #30 was sent to patient's pharmacy.  PDMP reviewed prior to prescribing opiate medications, no evidence of noted. Patient is alert, oriented, and ambulating without assistance.  Patient was therefore discharged home today in a hemodynamically stable condition.   Jonathan [name] will follow-up with PCP within 1 week of this discharge. Jonathan [name] was counseled extensively about nonpharmacologic means of pain management, patient verbalized understanding and was appreciative of  the care received during this admission.   We discussed the need for good hydration, monitoring of hydration status, avoidance of heat, cold, stress, and infection triggers. We discussed the need to be adherent with taking Hydrea and other home medications. Patient was reminded of the need to seek medical attention immediately if any symptom of bleeding, anemia, or infection occurs.  Discharge Exam: Vitals:   10/26/20 0802 10/26/20 1025  BP:  138/87  Pulse:  (!) 101  Resp: 18 18  Temp:  98.6 F (37 C)  SpO2: 95% 100%   Vitals:   10/26/20 0405 10/26/20 0636 10/26/20 0802 10/26/20 1025  BP: 124/75   138/87  Pulse: (!) 109   (!) 101  Resp: 20 20 18  18  Temp: 98.8 F (37.1 C)   98.6 F (37 C)  TempSrc: Oral   Oral  SpO2: 97% 98% 95% 100%  Weight:      Height:        General appearance : Awake, alert, not in any distress. Speech Clear. Not toxic looking HEENT: Atraumatic and Normocephalic, pupils equally reactive to light and accomodation Neck: Supple, no JVD. No cervical lymphadenopathy.   Chest: Good air entry bilaterally, no added sounds  CVS: S1 S2 regular, no murmurs.  Abdomen: Bowel sounds present, Non tender and not distended with no gaurding, rigidity or rebound. Extremities: B/L Lower Ext shows no edema, both legs are warm to touch Neurology: Awake alert, and oriented X 3, CN II-XII intact, Non focal Skin: No Rash  Discharge Instructions  Discharge Instructions    Discharge patient   Complete by: As directed    Discharge disposition: 01-Home or Self Care   Discharge patient date: 10/26/2020     Allergies as of 10/26/2020      Reactions   Allergy Medication [diphenhydramine]       Medication List    TAKE these medications   ergocalciferol 1.25 MG (50000 UT) capsule Commonly known as: Drisdol Take 1 capsule (50,000 Units total) by mouth once a week.   folic acid 1 MG tablet Commonly known as: FOLVITE TAKE 1 TABLET(1 MG) BY MOUTH DAILY What changed: See the new instructions.   hydroxyurea 500 MG capsule Commonly known as: HYDREA Take 3 capsules (1,500 mg total) by mouth daily. What changed:   how much to take  when to take this  additional instructions   ibuprofen 600 MG tablet Commonly known as: ADVIL Take 1,200 mg by mouth every 8 (eight) hours as needed for moderate pain.   Oxycodone HCl 10 MG Tabs Take 1 tablet (10 mg total) by mouth every 6 (six) hours as needed (pain).       The results of significant diagnostics from this hospitalization (including imaging, microbiology, ancillary and laboratory) are listed below for reference.    Significant Diagnostic Studies: No results found.  Microbiology: Recent Results (from the past 240 hour(s))  Resp Panel by RT-PCR (Flu A&B, Covid) Nasopharyngeal Swab     Status: None   Collection Time: 10/23/20  9:00 PM   Specimen: Nasopharyngeal Swab; Nasopharyngeal(NP) swabs in vial transport medium  Result Value Ref Range Status   SARS Coronavirus 2 by RT PCR NEGATIVE NEGATIVE Final    Comment:  (NOTE) SARS-CoV-2 target nucleic acids are NOT DETECTED.  The SARS-CoV-2 RNA is generally detectable in upper respiratory specimens during the acute phase of infection. The lowest concentration of SARS-CoV-2 viral copies this assay can detect is 138 copies/mL. A negative result does not preclude SARS-Cov-2 infection and should not be used as the sole basis for treatment or other patient management decisions. A negative result may occur with  improper specimen collection/handling, submission of specimen other than nasopharyngeal swab, presence of viral mutation(s) within the areas targeted by this assay, and inadequate number of viral copies(<138 copies/mL). A negative result must be combined with clinical observations, patient history, and epidemiological information. The expected result is Negative.  Fact Sheet for Patients:  BloggerCourse.com  Fact Sheet for Healthcare Providers:  SeriousBroker.it  This test is no t yet approved or cleared by the [place] FDA and  has been authorized for detection and/or diagnosis of SARS-CoV-2 by FDA under an Emergency Use Authorization (EUA). This EUA will remain  in effect (meaning  this test can be used) for the duration of the COVID-19 declaration under Section 564(b)(1) of the Act, 21 U.S.C.section 360bbb-3(b)(1), unless the authorization is terminated  or revoked sooner.       Influenza A by PCR NEGATIVE NEGATIVE Final   Influenza B by PCR NEGATIVE NEGATIVE Final    Comment: (NOTE) The Xpert Xpress SARS-CoV-2/FLU/RSV plus assay is intended as an aid in the diagnosis of influenza from Nasopharyngeal swab specimens and should not be used as a sole basis for treatment. Nasal washings and aspirates are unacceptable for Xpert Xpress SARS-CoV-2/FLU/RSV testing.  Fact Sheet for Patients: BloggerCourse.com  Fact Sheet for Healthcare  Providers: SeriousBroker.it  This test is not yet approved or cleared by the [place] FDA and has been authorized for detection and/or diagnosis of SARS-CoV-2 by FDA under an Emergency Use Authorization (EUA). This EUA will remain in effect (meaning this test can be used) for the duration of the COVID-19 declaration under Section 564(b)(1) of the Act, 21 U.S.C. section 360bbb-3(b)(1), unless the authorization is terminated or revoked.  Performed at [facility], [address] [place], [place] [address]   Blood Culture (routine x 2)     Status: None (Preliminary result)   Collection Time: 10/23/20  9:15 PM   Specimen: BLOOD RIGHT FOREARM  Result Value Ref Range Status   Specimen Description   Final    BLOOD RIGHT FOREARM Performed at [facility], [address] [place], [place] [address]    Special Requests   Final    BOTTLES DRAWN AEROBIC AND ANAEROBIC Blood Culture adequate volume Performed at [facility], [address], [place], [place] [address]    Culture   Final    NO GROWTH 2 DAYS Performed at [facility], [address] [place], [place] [address]    Report Status PENDING  Incomplete  Blood Culture (routine x 2)     Status: None (Preliminary result)   Collection Time: 10/23/20  9:25 PM   Specimen: BLOOD LEFT HAND  Result Value Ref Range Status   Specimen Description   Final    BLOOD LEFT HAND Performed at [facility], [address], [place], [place] [address]    Special Requests   Final    BOTTLES DRAWN AEROBIC AND ANAEROBIC Blood Culture adequate volume Performed at [facility], [address], [place], [place] [address]    Culture   Final    NO GROWTH 2 DAYS Performed at [facility], [address], [place], [place] [address]    Report Status PENDING  Incomplete     Labs: Basic Metabolic Panel: Recent Labs   Lab 10/23/20 1551 10/23/20 2145 10/24/20 0506  NA 141 138 136  K 3.8 3.6 3.5  CL 108 105 103  CO2 22 23 25   GLUCOSE 114* 124* 128*  BUN 6 6 6   CREATININE 0.68 0.66 0.68  CALCIUM 9.9 9.4 9.2   Liver Function Tests: Recent Labs  Lab 10/23/20 1551 10/23/20 2145 10/24/20 0506  AST 31 28 30   ALT 19 17 17   ALKPHOS 75 75 75  BILITOT 1.7* 1.6* 2.9*  PROT 8.0 7.6 7.8  ALBUMIN 4.7 4.4 4.4   No results for input(s): LIPASE, AMYLASE in the last 168 hours. No results for input(s): AMMONIA in the last 168 hours. CBC: Recent Labs  Lab 10/23/20 1551 10/23/20 2145 10/24/20 0506 10/26/20 0455  WBC 10.7* 14.7* 14.1* 13.1*  NEUTROABS  7.1 9.4* 9.9*  --   HGB 12.0* 11.0* 10.9* 10.7*  HCT 34.0* 32.2* 31.4* 30.8*  MCV 94.2 95.3 93.7 93.9  PLT 399 294 304 262   Cardiac Enzymes: No results for input(s): CKTOTAL, CKMB, CKMBINDEX, TROPONINI in the last 168 hours. BNP: Invalid input(s): POCBNP CBG: No results for input(s): GLUCAP in the last 168 hours.  Time coordinating discharge: 35 minutes  Signed:  Nolon NationsLachina Moore Aylin Rhoads  APRN, MSN, FNP-C Patient Care [facility] [address]  [place], [place] [id number] [phone]  [facility] 10/26/2020, 10:48 AM

## 2020-10-29 ENCOUNTER — Telehealth (HOSPITAL_COMMUNITY): Payer: Self-pay | Admitting: *Deleted

## 2020-10-29 ENCOUNTER — Non-Acute Institutional Stay (HOSPITAL_COMMUNITY)
Admission: AD | Admit: 2020-10-29 | Discharge: 2020-10-29 | Disposition: A | Discharge status: Home / Self Care | Payer: Commercial Managed Care - PPO | Source: Ambulatory Visit | Attending: Internal Medicine | Admitting: Internal Medicine

## 2020-10-29 DIAGNOSIS — D5700 Hb-SS disease with crisis, unspecified: Secondary | ICD-10-CM | POA: Diagnosis present

## 2020-10-29 DIAGNOSIS — Z79899 Other long term (current) drug therapy: Secondary | ICD-10-CM | POA: Insufficient documentation

## 2020-10-29 DIAGNOSIS — Z888 Allergy status to other drugs, medicaments and biological substances status: Secondary | ICD-10-CM | POA: Diagnosis not present

## 2020-10-29 LAB — COMPREHENSIVE METABOLIC PANEL
ALT: 13 U/L (ref 0–44)
AST: 23 U/L (ref 15–41)
Albumin: 3.8 g/dL (ref 3.5–5.0)
Alkaline Phosphatase: 107 U/L (ref 38–126)
Anion gap: 12 (ref 5–15)
BUN: 8 mg/dL (ref 6–20)
CO2: 24 mmol/L (ref 22–32)
Calcium: 9 mg/dL (ref 8.9–10.3)
Chloride: 99 mmol/L (ref 98–111)
Creatinine, Ser: 0.77 mg/dL (ref 0.61–1.24)
GFR, Estimated: >60 mL/min (ref >60)
Glucose, Bld: 86 mg/dL (ref 70–99)
Potassium: 3.9 mmol/L (ref 3.5–5.1)
Sodium: 135 mmol/L (ref 135–145)
Total Bilirubin: 1.5 mg/dL — ABNORMAL HIGH (ref 0.3–1.2)
Total Protein: 7.8 g/dL (ref 6.5–8.1)

## 2020-10-29 LAB — CULTURE, BLOOD (ROUTINE X 2)
Culture: NO GROWTH
Culture: NO GROWTH
Special Requests: ADEQUATE
Special Requests: ADEQUATE

## 2020-10-29 MED ORDER — ACETAMINOPHEN 500 MG PO TABS
1000.0000 mg | ORAL_TABLET | Freq: Once | ORAL | Status: AC
  Administered 2020-10-29: 1000 mg via ORAL
  Filled 2020-10-29: qty 2

## 2020-10-29 MED ORDER — IBUPROFEN 800 MG PO TABS
800.0000 mg | ORAL_TABLET | Freq: Three times a day (TID) | ORAL | 2 refills | Status: DC | PRN
Start: 2020-10-29 — End: 2020-12-20

## 2020-10-29 MED ORDER — SODIUM CHLORIDE 0.9% FLUSH: 9.0000 mL | INTRAVENOUS | Status: DC | PRN

## 2020-10-29 MED ORDER — ONDANSETRON HCL 4 MG/2ML IJ SOLN
4.0000 mg | Freq: Four times a day (QID) | INTRAMUSCULAR | Status: DC | PRN
  Administered 2020-10-29: 4 mg via INTRAVENOUS
  Filled 2020-10-29: qty 2

## 2020-10-29 MED ORDER — NALOXONE HCL 0.4 MG/ML IJ SOLN: 0.4000 mg | INTRAMUSCULAR | Status: DC | PRN

## 2020-10-29 MED ORDER — SODIUM CHLORIDE 0.45 % IV SOLN: INTRAVENOUS | Status: DC

## 2020-10-29 MED ORDER — KETOROLAC TROMETHAMINE 30 MG/ML IJ SOLN
15.0000 mg | Freq: Once | INTRAMUSCULAR | Status: AC
  Administered 2020-10-29: 15 mg via INTRAVENOUS
  Filled 2020-10-29: qty 1

## 2020-10-29 MED ORDER — HYDROMORPHONE 1 MG/ML IV SOLN
INTRAVENOUS | Status: DC
  Administered 2020-10-29: 30 mg via INTRAVENOUS
  Administered 2020-10-29: 3 mg via INTRAVENOUS
  Filled 2020-10-29: qty 30

## 2020-10-29 NOTE — H&P (Signed)
[facility] History and Physical   Date: 10/29/2020  Patient name: Jonathan [name] Medical record number: [id number] Date of birth: Jan 19, 2001 Age: 20 y.o. Gender: male PCP: Quentin Angst, MD  Attending physician: Quentin Angst, MD  Chief Complaint: Sickle cell pain  History of Present Illness:  Jonathan [name] is a 20 year old male with a medical history significant for sickle cell disease presents with complaints of generalized pain that is consistent with his typical pain crisis.  Patient was discharged from inpatient services on 10/26/2020 for sickle cell pain crisis.  He states that pain was resolved at discharge, but returned on yesterday and was unrelieved by home medications.  He last had oxycodone and ibuprofen this a.m. without sustained relief.  He denies any headache, subjective fever, chills, chest pain, urinary symptoms, nausea, vomiting, or diarrhea.  Patient has had no sick contacts, recent travel, or exposure to COVID-19. Meds: Medications Prior to Admission  Medication Sig Dispense Refill Last Dose  . ergocalciferol (DRISDOL) 1.25 MG (50000 UT) capsule Take 1 capsule (50,000 Units total) by mouth once a week. 30 capsule 2   . folic acid (FOLVITE) 1 MG tablet TAKE 1 TABLET(1 MG) BY MOUTH DAILY (Patient taking differently: Take 1 mg by mouth daily.) 90 tablet 3   . hydroxyurea (HYDREA) 500 MG capsule Take 3 capsules (1,500 mg total) by mouth daily. (Patient taking differently: Take 1,500-2,000 mg by mouth See admin instructions. Takes 3 and 4 tablets by mouth on alternate days) 90 capsule 2   . ibuprofen (ADVIL) 600 MG tablet Take 1,200 mg by mouth every 8 (eight) hours as needed for moderate pain.     . Oxycodone HCl 10 MG TABS Take 1 tablet (10 mg total) by mouth every 6 (six) hours as needed (pain). 30 tablet 0     Allergies: Allergy medication [diphenhydramine] Past Medical History:  Diagnosis Date  . Acute chest syndrome(517.3) 2013   march  2013, Sept 2013  . Asthma   . Avascular necrosis of bone of left hip (HCC)   . 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 Sickle cell disease with crisis (HCC)   . Sickle cell disease, type SS (HCC)   . Sickle cell pain crisis (HCC) 10/02/2012  . Vaso-occlusive sickle cell crisis (HCC) 11/09/2011   Past Surgical History:  Procedure Laterality Date  . ADENOIDECTOMY    . SPLENECTOMY    . TONSILLECTOMY    . TONSILLECTOMY AND ADENOIDECTOMY    . TOTAL HIP ARTHROPLASTY Left 03/07/2016  . UMBILICAL HERNIA REPAIR     Family History  Problem Relation Age of Onset  . Hypertension Mother   . Hypertension Maternal Grandmother   . Hypertension Maternal Grandfather   . Asthma Paternal Grandfather   . Diabetes Paternal Grandfather    Social History   Socioeconomic History  . Marital status: Single    Spouse name: Not on file  . Number of children: Not on file  . Years of education: Not on file  . Highest education level: Not on file  Occupational History  . Not on file  Tobacco Use  . Smoking status: Never Smoker  . Smokeless tobacco: Never Used  Vaping Use  . Vaping Use: Never used  Substance and Sexual Activity  . Alcohol use: No    Alcohol/week: 0.0 standard drinks  . Drug use: No  . Sexual activity: Never  Other Topics Concern  . Not on file  Social History Narrative  Lives with mother.  No smokers at home      Update:       05/02/16 Lives with Mother only   Social Determinants of Health   Financial Resource Strain: Not on file  Food Insecurity: Not on file  Transportation Needs: Not on file  Physical Activity: Not on file  Stress: Not on file  Social Connections: Not on file  Intimate Partner Violence: Not on file   Review of Systems  Constitutional: Negative for chills and fever.  HENT: Negative.   Eyes: Negative.   Cardiovascular: Negative.   Genitourinary: Negative.   Musculoskeletal: Positive for back pain and joint pain.  Skin: Negative.    Neurological: Negative.   Psychiatric/Behavioral: Negative.      Physical Exam: Blood pressure 119/67, pulse (!) 102, temperature 100.2 F (37.9 C), temperature source Oral, resp. rate 18, SpO2 100 %.  Physical Exam Constitutional:      Appearance: Normal appearance.  Eyes:     Pupils: Pupils are equal, round, and reactive to light.  Cardiovascular:     Rate and Rhythm: Normal rate and regular rhythm.  Pulmonary:     Effort: Pulmonary effort is normal.  Abdominal:     General: Bowel sounds are normal.  Neurological:     General: No focal deficit present.     Mental Status: He is alert. Mental status is at baseline.  Psychiatric:        Mood and Affect: Mood normal.        Thought Content: Thought content normal.        Judgment: Judgment normal.      Lab results: No results found for this or any previous visit (from the past 24 hour(s)).  Imaging results:  No results found.   Assessment & Plan: Patient admitted to sickle cell day infusion center for management of pain crisis.  Patient is opiate tolerant Initiate IV dilaudid PCA. Settings of 0.5 mg, 10 minute lockout and 3 mg/hr IV fluids, 0.45% saline at 100 ml/hr Toradol 15 mg IV times one dose Tylenol 1000 mg by mouth times one dose Review CBC with differential, complete metabolic panel, and reticulocytes as results become available.  Pain intensity will be reevaluated in context of functioning and relationship to baseline as care progresses If pain intensity remains elevated and/or sudden change in hemodynamic stability transition to inpatient services for higher level of care.     [name]  APRN, MSN, FNP-C Patient Care [facility] [address] [place], [place] [address] [phone]   10/29/2020, 11:40 AM

## 2020-10-29 NOTE — Progress Notes (Signed)
Pt admitted to the day hospital for treatment of sickle cell pain crisis. Pt reported pain to L arm and back rated a 7/10. Pt given PO Tylenol, IV Toradol, placed on Dilaudid PCA, and hydrated with IV fluids. At discharge patient reported pain a 2/10. Discharge instructions given to pt. Pt alert , oriented and ambulatory at discharge.

## 2020-10-29 NOTE — Telephone Encounter (Signed)
Patient called requesting to come to the day hospital for sickle cell pain. Patient reports left arm and lower back pain rated 7/10. Reports taking Oxycodone at 4:00 am and Ibuprofen at 7:00 am. Patient was recently admitted to the hospital for pain crisis and discharged last Friday. COVID-19 screening done and patient denies all symptoms and exposures. Denies fever, chest pain, nausea, vomiting, diarrhea, abdominal pain and priapism. Admits to having transportation without driving self. [place], FNP notified. Patient can come to the day hospital for pain management. Patient advised and expresses an understanding.

## 2020-10-29 NOTE — Discharge Summary (Signed)
[facility] Discharge Summary   Patient ID: Jonathan [name] MRN: [id number] DOB/AGE: [birth year] 20 y.o.  Admit date: 10/29/2020 Discharge date: 10/29/2020  Primary Care Physician:  Quentin Angst, MD  Admission Diagnoses:  Active Problems:   Sickle cell pain crisis [facility])   Discharge Medications:  Allergies as of 10/29/2020      Reactions   Allergy Medication [diphenhydramine]       Medication List    TAKE these medications   ergocalciferol 1.25 MG (50000 UT) capsule Commonly known as: Drisdol Take 1 capsule (50,000 Units total) by mouth once a week.   folic acid 1 MG tablet Commonly known as: FOLVITE TAKE 1 TABLET(1 MG) BY MOUTH DAILY What changed: See the new instructions.   hydroxyurea 500 MG capsule Commonly known as: HYDREA Take 3 capsules (1,500 mg total) by mouth daily. What changed:   how much to take  when to take this  additional instructions   ibuprofen 800 MG tablet Commonly known as: ADVIL Take 1 tablet (800 mg total) by mouth every 8 (eight) hours as needed for moderate pain. What changed:   medication strength  how much to take   Oxycodone HCl 10 MG Tabs Take 1 tablet (10 mg total) by mouth every 6 (six) hours as needed (pain).        Consults:  None  Significant Diagnostic Studies:  No results found.  History of present illness:  Jonathan [name] is a 20 year old male with a medical history significant for sickle cell disease presents with complaints of generalized pain that is consistent with his typical pain crisis.  Patient was discharged from inpatient services on 10/26/2020 for sickle cell pain crisis.  He states that pain was resolved at discharge, but returned on yesterday and was unrelieved by home medications.  He last had oxycodone and ibuprofen this a.m. without sustained relief.  He denies any headache, subjective fever, chills, chest pain, urinary symptoms, nausea, vomiting, or diarrhea.  Patient has had no  sick contacts, recent travel, or exposure to COVID-19. [facility] Course: Reviewed all laboratory values, consistent with patient's baseline Pain managed with IV Dilaudid PCA Toradol 15 mg IV x1 Tylenol 1000 mg by mouth x1 IV fluids, 0.45% saline at 100 mL/h Pain intensity decreased to 2/10.  Patient is requesting discharge home. He is alert, oriented, and ambulating without assistance. He will discharge home in a hemodynamically stable condition.  Discharge instructions: Resume all home medications.   Follow up with PCP as previously  scheduled.   Discussed the importance of drinking 64 ounces of water daily, dehydration of red blood cells may lead further sickling.   Avoid all stressors that precipitate sickle cell pain crisis.     The patient was given clear instructions to go to ER or return to medical center if symptoms do not improve, worsen or new problems develop.     Physical Exam at Discharge:  BP 117/81 (BP Location: Right Arm)   Pulse 80   Temp 97.9 F (36.6 C) (Oral)   Resp 16   SpO2 99%   Physical Exam Constitutional:      Appearance: Normal appearance.  Eyes:     Pupils: Pupils are equal, round, and reactive to light.  Cardiovascular:     Rate and Rhythm: Normal rate and regular rhythm.     Pulses: Normal pulses.  Pulmonary:     Effort: Pulmonary effort is normal.  Abdominal:     General: Abdomen  is flat. Bowel sounds are normal.  Skin:    General: Skin is warm.  Neurological:     General: No focal deficit present.     Mental Status: He is alert. Mental status is at baseline.  Psychiatric:        Mood and Affect: Mood normal.        Thought Content: Thought content normal.        Judgment: Judgment normal.      Disposition at Discharge: Discharge disposition: 01-Home or Self Care       Discharge Orders: Discharge Instructions    Discharge patient   Complete by: As directed    Discharge disposition: 01-Home or Self  Care   Discharge patient date: 10/29/2020      Condition at Discharge:   Stable  Time spent on Discharge:  Greater than 30 minutes.  Signed: [name]  APRN, MSN, FNP-C Patient Care [facility] [address] [place], [place] [address] [phone]  10/29/2020, 4:43 PM

## 2020-10-30 ENCOUNTER — Telehealth (INDEPENDENT_AMBULATORY_CARE_PROVIDER_SITE_OTHER): Payer: Commercial Managed Care - PPO | Admitting: Family Medicine

## 2020-10-30 DIAGNOSIS — J4520 Mild intermittent asthma, uncomplicated: Secondary | ICD-10-CM | POA: Diagnosis not present

## 2020-10-30 DIAGNOSIS — D571 Sickle-cell disease without crisis: Secondary | ICD-10-CM | POA: Diagnosis not present

## 2020-10-30 NOTE — Progress Notes (Signed)
Mr. Jonathan [name], [place] are scheduled for a virtual visit with your provider today.    Just as we do with appointments in the office, we must obtain your consent to participate.  Your consent will be active for this visit and any virtual visit you may have with one of our providers in the next 365 days.    If you have a MyChart account, I can also send a copy of this consent to you electronically.  All virtual visits are billed to your insurance company just like a traditional visit in the office.  As this is a virtual visit, video technology does not allow for your provider to perform a traditional examination.  This may limit your provider's ability to fully assess your condition.  If your provider identifies any concerns that need to be evaluated in person or the need to arrange testing such as labs, EKG, etc, we will make arrangements to do so.    Although advances in technology are sophisticated, we cannot ensure that it will always work on either your end or our end.  If the connection with a video visit is poor, we may have to switch to a telephone visit.  With either a video or telephone visit, we are not always able to ensure that we have a secure connection.   I need to obtain your verbal consent now.   Are you willing to proceed with your visit today?   Markeis Allman [name] has provided verbal consent on 10/30/2020 for a virtual visit (video or telephone).     Virtual Visit via Telephone Note  I connected with Danarius T Burnham on 10/30/20 at  1:40 PM EST by telephone and verified that I am speaking with the correct person using two identifiers.  Location: Patient: Home Provider: [place]   I discussed the limitations, risks, security and privacy concerns of performing an evaluation and management service by telephone and the availability of in person appointments. I also discussed with the patient that there may be a patient responsible charge related to this service. The  patient expressed understanding and agreed to proceed.   History of Present Illness: Jonathan [name] is a 20 year old male with a medical history significant for sickle cell disease and mild intermittent asthma that presents via video for a post hospital follow up.  Patient was admitted on 10/23/2020 with sickle cell pain crisis.  He was treated with IV fluids, IV Toradol, and IV Dilaudid.  Patient states that his pain intensity decreased throughout admission.  He was transition to oxycodone prior to returning home.  On 10/28/2020, the pain intensified and was primarily to low back and lower extremities.  Patient was treated and evaluated on 10/29/2020 sickle cell day infusion center.  He states that pain crisis is since resolved.  He says he has been hydrating consistently, taking folic acid, and hydroxyurea.  He currently denies any headache, chest pain, urinary symptoms, nausea, vomiting, or diarrhea.   Past Medical History:  Diagnosis Date  . Acute chest syndrome(517.3) 2013   march 2013, Sept 2013  . Asthma   . Avascular necrosis of bone of left hip ([place])   . 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 Sickle cell disease with crisis ([place])   . Sickle cell disease, type SS ([place])   . Sickle cell pain crisis ([place]) 10/02/2012  . Vaso-occlusive sickle cell crisis ([place]) 11/09/2011   Social History   Socioeconomic History  . Marital status: Single  Spouse name: Not on file  . Number of children: Not on file  . Years of education: Not on file  . Highest education level: Not on file  Occupational History  . Not on file  Tobacco Use  . Smoking status: Never Smoker  . Smokeless tobacco: Never Used  Vaping Use  . Vaping Use: Never used  Substance and Sexual Activity  . Alcohol use: No    Alcohol/week: 0.0 standard drinks  . Drug use: No  . Sexual activity: Never  Other Topics Concern  . Not on file  Social History Narrative   Lives with mother.  No smokers at home       Update:       05/02/16 Lives with Mother only   Social Determinants of Health   Financial Resource Strain: Not on file  Food Insecurity: Not on file  Transportation Needs: Not on file  Physical Activity: Not on file  Stress: Not on file  Social Connections: Not on file  Intimate Partner Violence: Not on file   Immunization History  Administered Date(s) Administered  . DTaP 09/02/1999, 10/29/1999, 01/01/2000, 02/02/2001, 07/25/2003  . H1N1 06/14/2008  . Hepatitis A 03/10/2006, 04/20/2007, 06/23/2007  . Hepatitis B 15-Oct-1998, 09/02/1999, 01/01/2000  . HiB (PRP-OMP) 09/02/1999, 10/29/1999, 01/01/2000, 02/02/2001  . IPV 09/02/1999, 10/29/1999, 07/14/2000, 07/25/2003  . Influenza Split 07/14/2000, 06/23/2001, 06/14/2003, 07/02/2004, 07/14/2007, 06/14/2008, 06/06/2009, 06/06/2010, 06/18/2011, 05/24/2012  . Influenza,inj,Quad PF,6+ Mos 08/06/2013, 06/05/2014, 05/18/2015, 06/06/2016, 06/17/2017, 08/09/2019  . MMR 07/14/2000, 07/25/2003  . Meningococcal B, OMV 09/03/2016  . Meningococcal Conjugate 05/04/2001, 02/11/2006, 08/28/2010, 03/25/2016, 09/03/2016  . Pneumococcal Conjugate-13 09/02/1999, 10/29/1999, 01/01/2000, 09/16/2000, 06/06/2009  . Pneumococcal Polysaccharide-23 04/21/2001, 10/23/2004, 09/24/2005, 07/15/2011  . Tdap 05/16/2011  . Varicella 07/14/2000, 03/10/2006   Review of Systems  Constitutional: Negative.   HENT: Negative.   Eyes: Negative.   Respiratory: Negative.   Cardiovascular: Negative.   Gastrointestinal: Negative.   Genitourinary: Negative.   Musculoskeletal: Positive for joint pain.  Skin: Negative.   Neurological: Negative.   Psychiatric/Behavioral: Negative.     Assessment and Plan: 1. Hb-SS disease without crisis ([place]) Sickle cell disease - Continue Hydrea. We discussed the need for good hydration, monitoring of hydration status, avoidance of heat, cold, stress, and infection triggers. We discussed the risks and benefits of Hydrea, including bone  marrow suppression, the possibility of GI upset, skin ulcers, hair thinning, and teratogenicity. The patient was reminded of the need to seek medical attention of any symptoms of bleeding, anemia, or infection. Continue folic acid 1 mg daily to prevent aplastic bone marrow crises.   Pulmonary evaluation - Patient denies severe recurrent wheezes, shortness of breath with exercise, or persistent cough. If these symptoms develop, pulmonary function tests with spirometry will be ordered, and if abnormal, plan on referral to Pulmonology for further evaluation.  Cardiac - Routine screening for pulmonary hypertension is not recommended.   Eye - High risk of proliferative retinopathy. Annual eye exam with retinal exam recommended to patient.  Immunization status - Up to date with immunizations   Acute and chronic painful episodes -No medication changes warranted at this time    2. Mild intermittent asthma without complication Stable, continue home medications as needed  Follow Up Instructions:    I discussed the assessment and treatment plan with the patient. The patient was provided an opportunity to ask questions and all were answered. The patient agreed with the plan and demonstrated an understanding of the instructions.   The patient was advised to  call back or seek an in-person evaluation if the symptoms worsen or if the condition fails to improve as anticipated.  I provided 8 minutes of non-face-to-face time during this encounter.  [name]  APRN, MSN, FNP-C Patient [facility] [address] [place], [place] [address] [phone]

## 2020-11-11 DIAGNOSIS — J4520 Mild intermittent asthma, uncomplicated: Secondary | ICD-10-CM | POA: Insufficient documentation

## 2020-12-20 ENCOUNTER — Other Ambulatory Visit: Payer: Self-pay | Admitting: Family Medicine

## 2020-12-20 ENCOUNTER — Telehealth: Payer: Self-pay

## 2020-12-20 DIAGNOSIS — D571 Sickle-cell disease without crisis: Secondary | ICD-10-CM

## 2020-12-20 MED ORDER — IBUPROFEN 800 MG PO TABS: 800.0000 mg | ORAL_TABLET | Freq: Three times a day (TID) | ORAL | 2 refills | Status: DC | PRN

## 2020-12-20 MED ORDER — HYDROXYUREA 500 MG PO CAPS: 1500.0000 mg | ORAL_CAPSULE | Freq: Every day | ORAL | 2 refills | Status: DC

## 2020-12-20 NOTE — Telephone Encounter (Signed)
Med refill  Hydrozyurea 500 mg Ibuprofen 800mg 

## 2020-12-20 NOTE — Progress Notes (Signed)
Meds ordered this encounter  Medications  . ibuprofen (ADVIL) 800 MG tablet    Sig: Take 1 tablet (800 mg total) by mouth every 8 (eight) hours as needed for moderate pain.    Dispense:  30 tablet    Refill:  2    Order Specific Question:   Supervising Provider    Answer:   Quentin Angst [id number]  . hydroxyurea (HYDREA) 500 MG capsule    Sig: Take 3 capsules (1,500 mg total) by mouth daily.    Dispense:  90 capsule    Refill:  2    Order Specific Question:   Supervising Provider    Answer:   Quentin Angst [[id number]]    [name]  APRN, MSN, FNP-C Patient Care [facility] [address] [place], [place] [address] [phone]

## 2020-12-20 NOTE — Telephone Encounter (Signed)
Forwarding to [employer]

## 2021-01-01 ENCOUNTER — Ambulatory Visit: Payer: Commercial Managed Care - PPO | Admitting: Family Medicine

## 2021-05-13 ENCOUNTER — Telehealth (HOSPITAL_COMMUNITY): Payer: Self-pay | Admitting: *Deleted

## 2021-05-13 ENCOUNTER — Other Ambulatory Visit: Payer: Self-pay | Admitting: Internal Medicine

## 2021-05-13 MED ORDER — OXYCODONE HCL 10 MG PO TABS: 10.0000 mg | ORAL_TABLET | Freq: Four times a day (QID) | ORAL | 0 refills | Status: AC | PRN

## 2021-05-13 NOTE — Telephone Encounter (Signed)
Patient called requesting to come to the day infusion hospital for sickle cell pain. Patient reports back pain rated 6/10. Reports taking 800 mg Ibuprofen at 4:00 am. Patient reports being out of 5 mg Oxycodone and patient has not yet requested a refill. COVID-19 screening done and patient denies all symptom and exposures. Denies fever, chest pain, nausea, vomiting, diarrhea, abdominal pain and priapism. Dr. Hyman Hopes notified. The day hospital is at capacity and unable to accept additional patient. Dr. Hyman Hopes will call in patient's Oxycodone. Patient advised to pick up Oxycodone when available. Patient can call back to the day hospital in the morning if pain remains uncontrolled.  Patient advised and expresses an understanding.

## 2021-06-25 ENCOUNTER — Non-Acute Institutional Stay (HOSPITAL_BASED_OUTPATIENT_CLINIC_OR_DEPARTMENT_OTHER)
Admission: AD | Admit: 2021-06-25 | Discharge: 2021-06-25 | Disposition: A | Discharge status: Home / Self Care | Payer: Commercial Managed Care - PPO | Source: Ambulatory Visit | Attending: Internal Medicine | Admitting: Internal Medicine

## 2021-06-25 ENCOUNTER — Telehealth (HOSPITAL_COMMUNITY): Payer: Self-pay | Admitting: *Deleted

## 2021-06-25 DIAGNOSIS — Z888 Allergy status to other drugs, medicaments and biological substances status: Secondary | ICD-10-CM | POA: Insufficient documentation

## 2021-06-25 DIAGNOSIS — D5700 Hb-SS disease with crisis, unspecified: Secondary | ICD-10-CM | POA: Insufficient documentation

## 2021-06-25 DIAGNOSIS — Z833 Family history of diabetes mellitus: Secondary | ICD-10-CM | POA: Insufficient documentation

## 2021-06-25 DIAGNOSIS — Z8249 Family history of ischemic heart disease and other diseases of the circulatory system: Secondary | ICD-10-CM | POA: Insufficient documentation

## 2021-06-25 DIAGNOSIS — Z791 Long term (current) use of non-steroidal anti-inflammatories (NSAID): Secondary | ICD-10-CM | POA: Insufficient documentation

## 2021-06-25 LAB — COMPREHENSIVE METABOLIC PANEL
ALT: 15 U/L (ref 0–44)
AST: 25 U/L (ref 15–41)
Albumin: 4.4 g/dL (ref 3.5–5.0)
Alkaline Phosphatase: 78 U/L (ref 38–126)
Anion gap: 7 (ref 5–15)
BUN: 11 mg/dL (ref 6–20)
CO2: 26 mmol/L (ref 22–32)
Calcium: 9.2 mg/dL (ref 8.9–10.3)
Chloride: 106 mmol/L (ref 98–111)
Creatinine, Ser: 0.76 mg/dL (ref 0.61–1.24)
GFR, Estimated: >60 mL/min (ref >60)
Glucose, Bld: 72 mg/dL (ref 70–99)
Potassium: 3.9 mmol/L (ref 3.5–5.1)
Sodium: 139 mmol/L (ref 135–145)
Total Bilirubin: 1.1 mg/dL (ref 0.3–1.2)
Total Protein: 7.7 g/dL (ref 6.5–8.1)

## 2021-06-25 LAB — CBC WITH DIFFERENTIAL/PLATELET
Abs Immature Granulocytes: 0.03 10*3/uL (ref 0.00–0.07)
Basophils Absolute: 0.1 10*3/uL (ref 0.0–0.1)
Basophils Relative: 1 %
Eosinophils Absolute: 0.5 10*3/uL (ref 0.0–0.5)
Eosinophils Relative: 5 %
HCT: 30.6 % — ABNORMAL LOW (ref 39.0–52.0)
Hemoglobin: 10.8 g/dL — ABNORMAL LOW (ref 13.0–17.0)
Immature Granulocytes: 0 %
Lymphocytes Relative: 29 %
Lymphs Abs: 3 10*3/uL (ref 0.7–4.0)
MCH: 33.2 pg (ref 26.0–34.0)
MCHC: 35.3 g/dL (ref 30.0–36.0)
MCV: 94.2 fL (ref 80.0–100.0)
Monocytes Absolute: 0.9 10*3/uL (ref 0.1–1.0)
Monocytes Relative: 9 %
Neutro Abs: 5.8 10*3/uL (ref 1.7–7.7)
Neutrophils Relative %: 56 %
Platelets: 576 10*3/uL — ABNORMAL HIGH (ref 150–400)
RBC: 3.25 MIL/uL — ABNORMAL LOW (ref 4.22–5.81)
RDW: 15.9 % — ABNORMAL HIGH (ref 11.5–15.5)
WBC: 10.2 10*3/uL (ref 4.0–10.5)
nRBC: 0.7 % — ABNORMAL HIGH (ref 0.0–0.2)

## 2021-06-25 LAB — RETICULOCYTES
Immature Retic Fract: 41.6 % — ABNORMAL HIGH (ref 2.3–15.9)
RBC.: 3.25 MIL/uL — ABNORMAL LOW (ref 4.22–5.81)
Retic Count, Absolute: 257.4 10*3/uL — ABNORMAL HIGH (ref 19.0–186.0)
Retic Ct Pct: 7.9 % — ABNORMAL HIGH (ref 0.4–3.1)

## 2021-06-25 MED ORDER — SODIUM CHLORIDE 0.45 % IV SOLN: INTRAVENOUS | Status: DC

## 2021-06-25 MED ORDER — ONDANSETRON HCL 4 MG/2ML IJ SOLN: 4.0000 mg | Freq: Four times a day (QID) | INTRAMUSCULAR | Status: DC | PRN

## 2021-06-25 MED ORDER — HYDROMORPHONE 1 MG/ML IV SOLN
INTRAVENOUS | Status: DC
  Administered 2021-06-25: 4.7 mg via INTRAVENOUS
  Administered 2021-06-25: 30 mg via INTRAVENOUS
  Filled 2021-06-25: qty 30

## 2021-06-25 MED ORDER — KETOROLAC TROMETHAMINE 30 MG/ML IJ SOLN
15.0000 mg | Freq: Once | INTRAMUSCULAR | Status: AC
  Administered 2021-06-25: 15 mg via INTRAVENOUS
  Filled 2021-06-25: qty 1

## 2021-06-25 MED ORDER — ACETAMINOPHEN 500 MG PO TABS: 1000.0000 mg | ORAL_TABLET | Freq: Once | ORAL | Status: DC

## 2021-06-25 MED ORDER — NALOXONE HCL 0.4 MG/ML IJ SOLN: 0.4000 mg | INTRAMUSCULAR | Status: DC | PRN

## 2021-06-25 MED ORDER — SODIUM CHLORIDE 0.9% FLUSH
9.0000 mL | INTRAVENOUS | Status: DC | PRN
Start: 2021-06-25 — End: 2021-06-25

## 2021-06-25 NOTE — H&P (Signed)
[facility] History and Physical   Date: 06/25/2021  Patient name: Jonathan [name] Medical record number: [id number] Date of birth: 2000/11/05 Age: 20 y.o. Gender: male PCP: Quentin Angst, MD  Attending physician: Quentin Angst, MD  Chief Complaint: Sickle cell pain   History of Present Illness: Jonathan [name] is a 20 year old male with a medical history significant for sickle cell disease presents with complaints of right hip and right lower extremity pain over the past 2 days that is consistent with previous sickle cell pain crisis.  Patient attributes pain crisis to changes in weather.  He states that he last had ibuprofen and oxycodone this a.m. without any relief.  He rates his pain intensity at 9/10 characterized as intermittent and aching.  He denies any fever, chills, headache, chest pain, urinary symptoms, nausea, vomiting, or diarrhea.  No sick contacts, recent travel, or exposure to COVID-19.  Meds: Medications Prior to Admission  Medication Sig Dispense Refill Last Dose   ergocalciferol (DRISDOL) 1.25 MG (50000 UT) capsule Take 1 capsule (50,000 Units total) by mouth once a week. 30 capsule 2    folic acid (FOLVITE) 1 MG tablet TAKE 1 TABLET(1 MG) BY MOUTH DAILY (Patient taking differently: Take 1 mg by mouth daily.) 90 tablet 3    hydroxyurea (HYDREA) 500 MG capsule Take 3 capsules (1,500 mg total) by mouth daily. 90 capsule 2    ibuprofen (ADVIL) 800 MG tablet Take 1 tablet (800 mg total) by mouth every 8 (eight) hours as needed for moderate pain. 30 tablet 2     Allergies: Allergy medication [diphenhydramine] Past Medical History:  Diagnosis Date   Acute chest syndrome(517.3) 2013   march 2013, Sept 2013   Asthma    Avascular necrosis of bone of left hip ([facility])    Hb-SS disease with vaso-occlusive crisis ([facility]) 07/28/2011   Sickle cell disease with crisis ([facility])    Sickle cell disease, type SS ([facility])    Sickle cell pain crisis ([facility]) 10/02/2012    Vaso-occlusive sickle cell crisis ([facility]) 11/09/2011   Past Surgical History:  Procedure Laterality Date   ADENOIDECTOMY     SPLENECTOMY     TONSILLECTOMY     TONSILLECTOMY AND ADENOIDECTOMY     TOTAL HIP ARTHROPLASTY Left 03/07/2016   UMBILICAL HERNIA REPAIR     Family History  Problem Relation Age of Onset   Hypertension Mother    Hypertension Maternal Grandmother    Hypertension Maternal Grandfather    Asthma Paternal Grandfather    Diabetes Paternal Grandfather    Social History   Socioeconomic History   Marital status: Single    Spouse name: Not on file   Number of children: Not on file   Years of education: Not on file   Highest education level: Not on file  Occupational History   Not on file  Tobacco Use   Smoking status: Never   Smokeless tobacco: Never  Vaping Use   Vaping Use: Never used  Substance and Sexual Activity   Alcohol use: No    Alcohol/week: 0.0 standard drinks   Drug use: No   Sexual activity: Never  Other Topics Concern   Not on file  Social History Narrative   Lives with mother.  No smokers at home      Update:       05/02/16 Lives with Mother only   Social Determinants of Health   Financial Resource Strain: Not on file  Food Insecurity: Not on file  Transportation Needs: Not on file  Physical Activity: Not on file  Stress: Not on file  Social Connections: Not on file  Intimate Partner Violence: Not on file   Review of Systems  Constitutional:  Negative for chills and fever.  HENT: Negative.    Eyes: Negative.   Respiratory: Negative.    Cardiovascular: Negative.   Gastrointestinal: Negative.   Genitourinary: Negative.   Musculoskeletal:  Positive for back pain and joint pain.  Skin: Negative.   Neurological: Negative.   Psychiatric/Behavioral: Negative.     Physical Exam: There were no vitals taken for this visit. Physical Exam Constitutional:      Appearance: Normal appearance.  Eyes:     Pupils: Pupils are equal,  round, and reactive to light.  Cardiovascular:     Rate and Rhythm: Normal rate and regular rhythm.     Pulses: Normal pulses.  Pulmonary:     Effort: Pulmonary effort is normal.  Abdominal:     General: Abdomen is flat. Bowel sounds are normal.  Musculoskeletal:        General: Normal range of motion.  Skin:    General: Skin is warm.  Neurological:     General: No focal deficit present.     Mental Status: He is alert. Mental status is at baseline.  Psychiatric:        Mood and Affect: Mood normal.        Behavior: Behavior normal.        Thought Content: Thought content normal.        Judgment: Judgment normal.     Lab results: No results found for this or any previous visit (from the past 24 hour(s)).  Imaging results:  No results found.   Assessment & Plan: Patient admitted to sickle cell day infusion center for management of pain crisis.  Patient is opiate tolerant Initiate IV dilaudid PCA.  IV fluids, 0.45% 100 ml/hr Toradol 15 mg IV times one dose Tylenol 1000 mg by mouth times one dose Review CBC with differential, complete metabolic panel, and reticulocytes as results become available. Pain intensity will be reevaluated in context of functioning and relationship to baseline as care progresses If pain intensity remains elevated and/or sudden change in hemodynamic stability transition to inpatient services for higher level of care.      [name]  APRN, MSN, FNP-C Patient Care [facility] [address] [place], [place] [address] [phone]  06/25/2021, 9:16 AM

## 2021-06-25 NOTE — Discharge Summary (Signed)
[facility] Discharge Summary   Patient ID: CORRAN LALONE MRN: [id number] DOB/AGE: April 19, 2001 19 y.o.  Admit date: 06/25/2021 Discharge date: 06/25/2021  Primary Care Physician:  Quentin Angst, MD  Admission Diagnoses:  Active Problems:   Sickle cell pain crisis [facility])   Discharge Medications:  Allergies as of 06/25/2021       Reactions   Allergy Medication [diphenhydramine]         Medication List     TAKE these medications    ergocalciferol 1.25 MG (50000 UT) capsule Commonly known as: Drisdol Take 1 capsule (50,000 Units total) by mouth once a week.   folic acid 1 MG tablet Commonly known as: FOLVITE TAKE 1 TABLET(1 MG) BY MOUTH DAILY What changed: See the new instructions.   hydroxyurea 500 MG capsule Commonly known as: HYDREA Take 3 capsules (1,500 mg total) by mouth daily.   ibuprofen 800 MG tablet Commonly known as: ADVIL Take 1 tablet (800 mg total) by mouth every 8 (eight) hours as needed for moderate pain.         Consults:  None  Significant Diagnostic Studies:  No results found.  History of present illness:  Gustabo Rhem is a 20 year old male with a medical history significant for sickle cell disease presents with complaints of right hip and right lower extremity pain over the past 2 days that is consistent with previous sickle cell pain crisis.  Patient attributes pain crisis to changes in weather.  He states that he last had ibuprofen and oxycodone this a.m. without any relief.  He rates his pain intensity at 9/10 characterized as intermittent and aching.  He denies any fever, chills, headache, chest pain, urinary symptoms, nausea, vomiting, or diarrhea.  No sick contacts, recent travel, or exposure to COVID-19.  [facility] Course: Patient admitted to sickle cell day clinic for management of pain crisis.  Laboratory values, consistent with patient's baseline. Pain managed with full dose IV Dilaudid  PCA Toradol 15 mg x 1 Tylenol 1000 mg x 1 IV fluids, 0.45% saline at 100 mL/h Pain intensity decreased to 4/10.  Patient is requesting discharge home.  He is alert, oriented, and ambulating without assistance.  Patient will discharge home in a hemodynamically stable condition.  Discharge instructions:  Resume all home medications.   Follow up with PCP as previously  scheduled.   Discussed the importance of drinking 64 ounces of water daily, dehydration of red blood cells may lead further sickling.   Avoid all stressors that precipitate sickle cell pain crisis.     The patient was given clear instructions to go to ER or return to medical center if symptoms do not improve, worsen or new problems develop.   Physical Exam at Discharge:  BP 117/73 (BP Location: Right Arm)   Pulse 84   Temp 97.8 F (36.6 C) (Temporal)   Resp 18   SpO2 98%   Physical Exam Constitutional:      Appearance: Normal appearance.  Eyes:     Pupils: Pupils are equal, round, and reactive to light.  Cardiovascular:     Rate and Rhythm: Normal rate and regular rhythm.     Pulses: Normal pulses.  Abdominal:     General: Abdomen is flat. Bowel sounds are normal.  Musculoskeletal:        General: Normal range of motion.  Skin:    General: Skin is warm.  Neurological:     General: No focal deficit present.  Mental Status: He is alert. Mental status is at baseline.     Disposition at Discharge: Discharge disposition: 01-Home or Self Care       Discharge Orders: Discharge Instructions     Discharge patient   Complete by: As directed    Discharge disposition: 01-Home or Self Care   Discharge patient date: 06/25/2021       Condition at Discharge:   Stable  Time spent on Discharge:  Greater than 30 minutes.  Signed: [name]  APRN, MSN, FNP-C Patient Care [facility] [address] [place], [place] [address] [phone]  06/25/2021, 3:31 PM

## 2021-06-25 NOTE — Progress Notes (Signed)
Patient admitted to the day infusion hospital for sickle cell pain. Initially, patient reported right hip pain rated 7/10. For pain management, patient placed on Dilaudid PCA, given Toradol and hydrated with IV fluids. At discharge, patient rated pain at 4/10. Vital signs stable. Discharge instructions given. Patient alert, oriented and ambulatory at discharge.

## 2021-06-25 NOTE — Telephone Encounter (Signed)
Patient called requesting to come to the day hospital for sickle cell pain. Patient reports right hip pain rated 7/10 x 2 days. Reports taking Ibuprofen at 6:00 am and Oxycodone at 2:00 am. COVID-19 screening done and patient denies all symptoms and exposures. Denies fever, chest pain, nausea, vomiting, diarrhea, abdominal pain and priapism. Admits to having transportation without driving self. Patient's sister will drive him. [place], FNP notified. Patient can come to the day hospital for pain management. Patient advised and expresses an understanding.

## 2021-06-26 ENCOUNTER — Encounter (HOSPITAL_COMMUNITY): Payer: Self-pay

## 2021-06-26 ENCOUNTER — Other Ambulatory Visit: Payer: Self-pay

## 2021-06-26 ENCOUNTER — Inpatient Hospital Stay (HOSPITAL_COMMUNITY)
Admission: EM | Admit: 2021-06-26 | Discharge: 2021-06-28 | DRG: 812 | Disposition: A | Discharge status: Home / Self Care | Payer: Commercial Managed Care - PPO | Attending: Internal Medicine | Admitting: Internal Medicine

## 2021-06-26 DIAGNOSIS — Z9081 Acquired absence of spleen: Secondary | ICD-10-CM | POA: Diagnosis not present

## 2021-06-26 DIAGNOSIS — G894 Chronic pain syndrome: Secondary | ICD-10-CM | POA: Diagnosis present

## 2021-06-26 DIAGNOSIS — D5700 Hb-SS disease with crisis, unspecified: Principal | ICD-10-CM

## 2021-06-26 DIAGNOSIS — I10 Essential (primary) hypertension: Secondary | ICD-10-CM | POA: Diagnosis present

## 2021-06-26 DIAGNOSIS — Z96642 Presence of left artificial hip joint: Secondary | ICD-10-CM | POA: Diagnosis present

## 2021-06-26 DIAGNOSIS — D75839 Thrombocytosis, unspecified: Secondary | ICD-10-CM

## 2021-06-26 DIAGNOSIS — Z888 Allergy status to other drugs, medicaments and biological substances status: Secondary | ICD-10-CM

## 2021-06-26 DIAGNOSIS — J4520 Mild intermittent asthma, uncomplicated: Secondary | ICD-10-CM | POA: Diagnosis present

## 2021-06-26 DIAGNOSIS — Z20822 Contact with and (suspected) exposure to covid-19: Secondary | ICD-10-CM | POA: Diagnosis present

## 2021-06-26 DIAGNOSIS — Z79899 Other long term (current) drug therapy: Secondary | ICD-10-CM

## 2021-06-26 DIAGNOSIS — Z825 Family history of asthma and other chronic lower respiratory diseases: Secondary | ICD-10-CM

## 2021-06-26 DIAGNOSIS — D638 Anemia in other chronic diseases classified elsewhere: Secondary | ICD-10-CM

## 2021-06-26 DIAGNOSIS — Z8249 Family history of ischemic heart disease and other diseases of the circulatory system: Secondary | ICD-10-CM | POA: Diagnosis not present

## 2021-06-26 LAB — CBC WITH DIFFERENTIAL/PLATELET
Abs Immature Granulocytes: 0.03 10*3/uL (ref 0.00–0.07)
Basophils Absolute: 0.1 10*3/uL (ref 0.0–0.1)
Basophils Relative: 1 %
Eosinophils Absolute: 0.4 10*3/uL (ref 0.0–0.5)
Eosinophils Relative: 4 %
HCT: 31.2 % — ABNORMAL LOW (ref 39.0–52.0)
Hemoglobin: 10.9 g/dL — ABNORMAL LOW (ref 13.0–17.0)
Immature Granulocytes: 0 %
Lymphocytes Relative: 18 %
Lymphs Abs: 1.7 10*3/uL (ref 0.7–4.0)
MCH: 33.3 pg (ref 26.0–34.0)
MCHC: 34.9 g/dL (ref 30.0–36.0)
MCV: 95.4 fL (ref 80.0–100.0)
Monocytes Absolute: 0.6 10*3/uL (ref 0.1–1.0)
Monocytes Relative: 6 %
Neutro Abs: 6.8 10*3/uL (ref 1.7–7.7)
Neutrophils Relative %: 71 %
Platelets: 540 10*3/uL — ABNORMAL HIGH (ref 150–400)
RBC: 3.27 MIL/uL — ABNORMAL LOW (ref 4.22–5.81)
RDW: 16.7 % — ABNORMAL HIGH (ref 11.5–15.5)
WBC: 9.6 10*3/uL (ref 4.0–10.5)
nRBC: 0.9 % — ABNORMAL HIGH (ref 0.0–0.2)

## 2021-06-26 LAB — COMPREHENSIVE METABOLIC PANEL
ALT: 14 U/L (ref 0–44)
AST: 29 U/L (ref 15–41)
Albumin: 4.4 g/dL (ref 3.5–5.0)
Alkaline Phosphatase: 71 U/L (ref 38–126)
Anion gap: 8 (ref 5–15)
BUN: 10 mg/dL (ref 6–20)
CO2: 23 mmol/L (ref 22–32)
Calcium: 9.2 mg/dL (ref 8.9–10.3)
Chloride: 104 mmol/L (ref 98–111)
Creatinine, Ser: 0.77 mg/dL (ref 0.61–1.24)
GFR, Estimated: >60 mL/min (ref >60)
Glucose, Bld: 103 mg/dL — ABNORMAL HIGH (ref 70–99)
Potassium: 4.2 mmol/L (ref 3.5–5.1)
Sodium: 135 mmol/L (ref 135–145)
Total Bilirubin: 1.3 mg/dL — ABNORMAL HIGH (ref 0.3–1.2)
Total Protein: 7.8 g/dL (ref 6.5–8.1)

## 2021-06-26 LAB — RETICULOCYTES
Immature Retic Fract: 34.9 % — ABNORMAL HIGH (ref 2.3–15.9)
RBC.: 3.32 MIL/uL — ABNORMAL LOW (ref 4.22–5.81)
Retic Count, Absolute: 277.6 10*3/uL — ABNORMAL HIGH (ref 19.0–186.0)
Retic Ct Pct: 8.4 % — ABNORMAL HIGH (ref 0.4–3.1)

## 2021-06-26 LAB — RESP PANEL BY RT-PCR (FLU A&B, COVID) ARPGX2
Influenza A by PCR: NEGATIVE
Influenza B by PCR: NEGATIVE
SARS Coronavirus 2 by RT PCR: NEGATIVE

## 2021-06-26 MED ORDER — HYDROXYUREA 500 MG PO CAPS
1500.0000 mg | ORAL_CAPSULE | Freq: Every day | ORAL | Status: DC
  Administered 2021-06-26 – 2021-06-28 (×3): 1500 mg via ORAL
  Filled 2021-06-26 (×3): qty 3

## 2021-06-26 MED ORDER — NALOXONE HCL 0.4 MG/ML IJ SOLN: 0.4000 mg | INTRAMUSCULAR | Status: DC | PRN

## 2021-06-26 MED ORDER — FOLIC ACID 1 MG PO TABS
1.0000 mg | ORAL_TABLET | Freq: Every day | ORAL | Status: DC
  Administered 2021-06-26 – 2021-06-28 (×3): 1 mg via ORAL
  Filled 2021-06-26 (×3): qty 1

## 2021-06-26 MED ORDER — POLYETHYLENE GLYCOL 3350 17 G PO PACK: 17.0000 g | PACK | Freq: Every day | ORAL | Status: DC | PRN

## 2021-06-26 MED ORDER — SODIUM CHLORIDE 0.45 % IV SOLN
INTRAVENOUS | Status: DC
Start: 2021-06-26 — End: 2021-06-28

## 2021-06-26 MED ORDER — HYDROMORPHONE HCL 2 MG/ML IJ SOLN
2.0000 mg | Freq: Once | INTRAMUSCULAR | Status: AC
  Administered 2021-06-26: 2 mg via INTRAVENOUS
  Filled 2021-06-26: qty 1

## 2021-06-26 MED ORDER — KETOROLAC TROMETHAMINE 15 MG/ML IJ SOLN
15.0000 mg | Freq: Four times a day (QID) | INTRAMUSCULAR | Status: DC
  Administered 2021-06-26 – 2021-06-28 (×7): 15 mg via INTRAVENOUS
  Filled 2021-06-26 (×6): qty 1

## 2021-06-26 MED ORDER — ONDANSETRON HCL 4 MG/2ML IJ SOLN: 4.0000 mg | INTRAMUSCULAR | Status: DC | PRN

## 2021-06-26 MED ORDER — ONDANSETRON HCL 4 MG/2ML IJ SOLN: 4.0000 mg | Freq: Four times a day (QID) | INTRAMUSCULAR | Status: DC | PRN

## 2021-06-26 MED ORDER — HYDROMORPHONE HCL 2 MG/ML IJ SOLN
2.0000 mg | INTRAMUSCULAR | Status: DC | PRN
  Administered 2021-06-26 (×4): 2 mg via INTRAVENOUS
  Filled 2021-06-26 (×5): qty 1

## 2021-06-26 MED ORDER — ENOXAPARIN SODIUM 40 MG/0.4ML IJ SOSY
40.0000 mg | PREFILLED_SYRINGE | INTRAMUSCULAR | Status: DC
  Administered 2021-06-26 – 2021-06-27 (×2): 40 mg via SUBCUTANEOUS
  Filled 2021-06-26 (×2): qty 0.4

## 2021-06-26 MED ORDER — HYDROMORPHONE 1 MG/ML IV SOLN
INTRAVENOUS | Status: DC
  Administered 2021-06-26: 30 mg via INTRAVENOUS
  Administered 2021-06-27: 4.59 mg via INTRAVENOUS
  Administered 2021-06-27: 4 mg via INTRAVENOUS
  Administered 2021-06-27: 3 mg via INTRAVENOUS
  Administered 2021-06-27: 5 mg via INTRAVENOUS
  Filled 2021-06-26: qty 30

## 2021-06-26 MED ORDER — HYDROMORPHONE HCL 2 MG/ML IJ SOLN
2.0000 mg | INTRAMUSCULAR | Status: AC
  Administered 2021-06-26: 2 mg via INTRAVENOUS
  Filled 2021-06-26: qty 1

## 2021-06-26 MED ORDER — SODIUM CHLORIDE 0.9% FLUSH: 9.0000 mL | INTRAVENOUS | Status: DC | PRN

## 2021-06-26 MED ORDER — SENNOSIDES-DOCUSATE SODIUM 8.6-50 MG PO TABS
1.0000 | ORAL_TABLET | Freq: Two times a day (BID) | ORAL | Status: DC
  Administered 2021-06-26 – 2021-06-28 (×4): 1 via ORAL
  Filled 2021-06-26 (×4): qty 1

## 2021-06-26 MED ORDER — KETOROLAC TROMETHAMINE 15 MG/ML IJ SOLN
15.0000 mg | INTRAMUSCULAR | Status: AC
  Administered 2021-06-26: 15 mg via INTRAVENOUS
  Filled 2021-06-26: qty 1

## 2021-06-26 MED ORDER — ACETAMINOPHEN 500 MG PO TABS
1000.0000 mg | ORAL_TABLET | Freq: Once | ORAL | Status: AC
  Administered 2021-06-26: 1000 mg via ORAL
  Filled 2021-06-26: qty 2

## 2021-06-26 NOTE — ED Provider Notes (Signed)
[facility]-EMERGENCY DEPT Provider Note   CSN: [id number] Arrival date & time: 06/26/21  9323     History Chief Complaint  Patient presents with   Sickle Cell Pain Crisis    Jonathan [name] is a 20 y.o. male presents to the emergency department with ongoing sickle cell pain crisis.  Patient reports pain in the right hip and the leg.  This is distant with previous sickle cell pain crises.  Records were reviewed.  [facility] was [facility] yesterday and treated for same.  Reports he did feel some better but after getting home he took his oxycodone without any relief.  Patient reports that this has been ongoing for the last 2-3 days.  Patient attributes the pain crisis to changes in weather.  Currently rates his pain at a 9/10 and states it is aching in nature.  Reports no fevers or chills, chest pain, shortness of breath, nausea, vomiting or diarrhea.  No sick contacts, cough or congestion.   The history is provided by the patient and medical records. No language interpreter was used.      Past Medical History:  Diagnosis Date   Acute chest syndrome(517.3) 2013   march 2013, Sept 2013   Asthma    Avascular necrosis of bone of left hip (HCC)    Hb-SS disease with vaso-occlusive crisis (HCC) 07/28/2011   Sickle cell disease with crisis (HCC)    Sickle cell disease, type SS (HCC)    Sickle cell pain crisis (HCC) 10/02/2012   Vaso-occlusive sickle cell crisis (HCC) 11/09/2011    Patient Active Problem List   Diagnosis Date Noted   Mild intermittent asthma without complication 11/11/2020   Sickle cell disease with crisis (HCC) 10/23/2020   Sickle cell pain crisis (HCC) 08/16/2020   Proteinuria 04/14/2020   Influenza A 10/13/2017   Sickle cell disease (HCC) 04/27/2017   Failed hearing screening 03/25/2016   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   Myopic astigmatism 03/16/2015   Avascular necrosis of bone of left hip (HCC) 03/24/2013    Chronic obstruct airways disease (HCC) 03/15/2013   Snoring 04/08/2012   Hypertension 11/11/2011   Reactive airway disease 11/09/2011   Sickle cell disease, 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/2011    Past Surgical History:  Procedure Laterality Date   ADENOIDECTOMY     SPLENECTOMY     TONSILLECTOMY     TONSILLECTOMY AND ADENOIDECTOMY     TOTAL HIP ARTHROPLASTY Left 03/07/2016   UMBILICAL HERNIA REPAIR         Family History  Problem Relation Age of Onset   Hypertension Mother    Hypertension Maternal Grandmother    Hypertension Maternal Grandfather    Asthma Paternal Grandfather    Diabetes Paternal Grandfather     Social History   Tobacco Use   Smoking status: Never   Smokeless tobacco: Never  Vaping Use   Vaping Use: Never used  Substance Use Topics   Alcohol use: No    Alcohol/week: 0.0 standard drinks   Drug use: No    Home Medications Prior to Admission medications   Medication Sig Start Date End Date Taking? Authorizing Provider  ergocalciferol (DRISDOL) 1.25 MG (50000 UT) capsule Take 1 capsule (50,000 Units total) by mouth once a week. 04/12/20   Barbette Merino, NP  folic acid (FOLVITE) 1 MG tablet TAKE 1 TABLET(1 MG) BY MOUTH DAILY Patient taking differently: Take 1 mg by mouth daily. 08/14/20   Barbette Merino,  NP  hydroxyurea (HYDREA) 500 MG capsule Take 3 capsules (1,500 mg total) by mouth daily. 12/20/20   Dorena Dew, FNP  ibuprofen (ADVIL) 800 MG tablet Take 1 tablet (800 mg total) by mouth every 8 (eight) hours as needed for moderate pain. 12/20/20   Dorena Dew, FNP    Allergies    Allergy medication [diphenhydramine]  Review of Systems   Review of Systems  Constitutional:  Negative for appetite change, diaphoresis, fatigue, fever and unexpected weight change.  HENT:  Negative for mouth sores.   Eyes:  Negative for visual disturbance.  Respiratory:  Negative for cough, chest tightness, shortness of breath and wheezing.   Cardiovascular:   Negative for chest pain.  Gastrointestinal:  Negative for abdominal pain, constipation, diarrhea, nausea and vomiting.  Endocrine: Negative for polydipsia, polyphagia and polyuria.  Genitourinary:  Negative for dysuria, frequency, hematuria and urgency.  Musculoskeletal:  Positive for arthralgias and myalgias. Negative for back pain and neck stiffness.  Skin:  Negative for rash.  Allergic/Immunologic: Negative for immunocompromised state.  Neurological:  Negative for syncope, light-headedness and headaches.  Hematological:  Does not bruise/bleed easily.  Psychiatric/Behavioral:  Negative for sleep disturbance. The patient is not nervous/anxious.    Physical Exam Updated Vital Signs BP (!) 140/100 (BP Location: Left Arm)   Pulse 75   Temp 98.1 F (36.7 C) (Oral)   Resp 16   SpO2 97%   Physical Exam Vitals and nursing note reviewed.  Constitutional:      General: He is not in acute distress.    Appearance: He is not diaphoretic.     Comments: Uncomfortable, rocking in the chair  HENT:     Head: Normocephalic.  Eyes:     General: No scleral icterus.    Conjunctiva/sclera: Conjunctivae normal.  Cardiovascular:     Rate and Rhythm: Normal rate and regular rhythm.     Pulses: Normal pulses.          Radial pulses are 2+ on the right side and 2+ on the left side.  Pulmonary:     Effort: Pulmonary effort is normal. No tachypnea, accessory muscle usage, prolonged expiration, respiratory distress or retractions.     Breath sounds: No stridor.     Comments: Equal chest rise. No increased work of breathing. Abdominal:     General: There is no distension.     Palpations: Abdomen is soft.     Tenderness: There is no abdominal tenderness. There is no guarding or rebound.  Musculoskeletal:     Cervical back: Normal range of motion.     Comments: Moves all extremities equally and without difficulty.  Skin:    General: Skin is warm and dry.     Capillary Refill: Capillary refill takes  less than 2 seconds.  Neurological:     Mental Status: He is alert.     GCS: GCS eye subscore is 4. GCS verbal subscore is 5. GCS motor subscore is 6.     Comments: Speech is clear and goal oriented.  Psychiatric:        Mood and Affect: Mood normal.    ED Results / Procedures / Treatments   Labs (all labs ordered are listed, but only abnormal results are displayed) Labs Reviewed  CBC WITH DIFFERENTIAL/PLATELET - Abnormal; Notable for the following components:      Result Value   RBC 3.27 (*)    Hemoglobin 10.9 (*)    HCT 31.2 (*)    RDW 16.7 (*)  Platelets 540 (*)    nRBC 0.9 (*)    All other components within normal limits  COMPREHENSIVE METABOLIC PANEL - Abnormal; Notable for the following components:   Glucose, Bld 103 (*)    Total Bilirubin 1.3 (*)    All other components within normal limits  RETICULOCYTES    EKG None  Radiology No results found.  Procedures Procedures   Medications Ordered in ED Medications  0.45 % sodium chloride infusion ( Intravenous New Bag/Given 06/26/21 0629)  HYDROmorphone (DILAUDID) injection 2 mg (has no administration in time range)  ondansetron (ZOFRAN) injection 4 mg (has no administration in time range)  ketorolac (TORADOL) 15 MG/ML injection 15 mg (15 mg Intravenous Given 06/26/21 0625)  HYDROmorphone (DILAUDID) injection 2 mg (2 mg Intravenous Given 06/26/21 0625)  acetaminophen (TYLENOL) tablet 1,000 mg (1,000 mg Oral Given 06/26/21 [id number])    ED Course  I have reviewed the triage vital signs and the nursing notes.  Pertinent labs & imaging results that were available during my care of the patient were reviewed by me and considered in my medical decision making (see chart for details).    MDM Rules/Calculators/A&P                           Presents with ongoing sickle cell pain crisis.  Patient reports primary pain is in the right hip and leg.  Typical of his pain crises.  Work-up initiated.  Care transferred to the day  team for further evaluation and treatment.  Care transferred to [facility].   Final Clinical Impression(s) / ED Diagnoses Final diagnoses:  Sickle cell pain crisis [facility])    Rx / DC Orders ED Discharge Orders     None        Maclin Guerrette, Gwenlyn Perking 06/26/21 [id number]    Ripley Fraise, MD 06/26/21 [phone]

## 2021-06-26 NOTE — H&P (Addendum)
H&P  Patient Demographics:  Jonathan [name], is a 20 y.o. male  MRN: [id number]   DOB - 2000/12/02  Admit Date - 06/26/2021  Outpatient Primary MD for the patient is Quentin Angst, MD  Chief Complaint  Patient presents with   Sickle Cell Pain Crisis      HPI:   Jonathan [name]  is a 20 y.o. male is a 20 year old male with a medical history significant for sickle cell disease that presents to the emergency department with complaints of right hip pain that is consistent with his previous sickle cell crisis.  Right hip pain has been present over the past 3-4 days and has been unrelieved by his home oxycodone.  He was treated and evaluated at the sickle cell infusion center on yesterday, he felt better prior to discharge.  However, he says that pain intensity escalated overnight.  He currently rates his pain as 8/10 characterized as constant and throbbing, occasionally sharp.  He denies any fever, chills, headache, shortness of breath, or chest pain.  No dizziness, urinary symptoms, nausea, vomiting, or diarrhea.  No sick contacts, recent travel, or exposure to COVID-19.  ER course: While in ER, vital signs remained stable.  Reviewed his CBC, hemoglobin 10.6 g/dL and platelets 222,979, otherwise unremarkable.  Complete metabolic panel unremarkable.  Pain persists despite IV Dilaudid, IV Toradol, and IV fluids.  Sickle cell team notified to admit for management of pain crisis.   Review of systems:  In addition to the HPI above, patient reports No fever or chills No Headache, No changes with vision or hearing No problems swallowing food or liquids No chest pain, cough or shortness of breath No abdominal pain, No nausea or vomiting, Bowel movements are regular No blood in stool or urine No dysuria No new skin rashes or bruises No new joints pains-aches No new weakness, tingling, numbness in any extremity No recent weight gain or loss No polyuria, polydypsia or polyphagia No significant  Mental Stressors    With Past History of the following :   Past Medical History:  Diagnosis Date   Acute chest syndrome(517.3) 2013   march 2013, Sept 2013   Asthma    Avascular necrosis of bone of left hip (HCC)    Hb-SS disease with vaso-occlusive crisis (HCC) 07/28/2011   Sickle cell disease with crisis (HCC)    Sickle cell disease, type SS (HCC)    Sickle cell pain crisis (HCC) 10/02/2012   Vaso-occlusive sickle cell crisis (HCC) 11/09/2011      Past Surgical History:  Procedure Laterality Date   ADENOIDECTOMY     SPLENECTOMY     TONSILLECTOMY     TONSILLECTOMY AND ADENOIDECTOMY     TOTAL HIP ARTHROPLASTY Left 03/07/2016   UMBILICAL HERNIA REPAIR       Social History:   Social History   Tobacco Use   Smoking status: Never   Smokeless tobacco: Never  Substance Use Topics   Alcohol use: No    Alcohol/week: 0.0 standard drinks     Lives - At home   Family History :   Family History  Problem Relation Age of Onset   Hypertension Mother    Hypertension Maternal Grandmother    Hypertension Maternal Grandfather    Asthma Paternal Grandfather    Diabetes Paternal Grandfather      Home Medications:   Prior to Admission medications   Medication Sig Start Date End Date Taking? Authorizing Provider  ergocalciferol (DRISDOL) 1.25 MG (50000 UT) capsule Take 1  capsule (50,000 Units total) by mouth once a week. Patient taking differently: Take 50,000 Units by mouth once a week. Sunday's 04/12/20  Yes King, Shana Chute, NP  folic acid (FOLVITE) 1 MG tablet TAKE 1 TABLET(1 MG) BY MOUTH DAILY Patient taking differently: Take 1 mg by mouth daily. 08/14/20  Yes Barbette Merino, NP  hydroxyurea (HYDREA) 500 MG capsule Take 3 capsules (1,500 mg total) by mouth daily. 12/20/20  Yes Massie Maroon, FNP  ibuprofen (ADVIL) 800 MG tablet Take 1 tablet (800 mg total) by mouth every 8 (eight) hours as needed for moderate pain. 12/20/20  Yes Massie Maroon, FNP  Oxycodone HCl 10 MG  TABS Take 10 mg by mouth every 4 (four) hours as needed (pain).   Yes [provider]     Allergies:   Allergies  Allergen Reactions   Allergy Medication [Diphenhydramine]      Physical Exam:   Vitals:   Vitals:   06/26/21 2337 06/27/21 0355  BP: 138/87 129/75  Pulse: 79 65  Resp: 16 16  Temp: 98 F (36.7 C) 98.4 F (36.9 C)  SpO2: 95% 94%    Physical Exam: Constitutional: Patient appears well-developed and well-nourished. Not in obvious distress. HENT: Normocephalic, atraumatic, External right and left ear normal. Oropharynx is clear and moist.  Eyes: Conjunctivae and EOM are normal. PERRLA, no scleral icterus. Neck: Normal ROM. Neck supple. No JVD. No tracheal deviation. No thyromegaly. CVS: RRR, S1/S2 +, no murmurs, no gallops, no carotid bruit.  Pulmonary: Effort and breath sounds normal, no stridor, rhonchi, wheezes, rales.  Abdominal: Soft. BS +, no distension, tenderness, rebound or guarding.  Musculoskeletal: Normal range of motion. No edema and no tenderness.  Lymphadenopathy: No lymphadenopathy noted, cervical, inguinal or axillary Neuro: Alert. Normal reflexes, muscle tone coordination. No cranial nerve deficit. Skin: Skin is warm and dry. No rash noted. Not diaphoretic. No erythema. No pallor. Psychiatric: Normal mood and affect. Behavior, judgment, thought content normal.   Data Review:   CBC Recent Labs  Lab 06/25/21 1025 06/26/21 0607  WBC 10.2 9.6  HGB 10.8* 10.9*  HCT 30.6* 31.2*  PLT 576* 540*  MCV 94.2 95.4  MCH 33.2 33.3  MCHC 35.3 34.9  RDW 15.9* 16.7*  LYMPHSABS 3.0 1.7  MONOABS 0.9 0.6  EOSABS 0.5 0.4  BASOSABS 0.1 0.1   ------------------------------------------------------------------------------------------------------------------  Chemistries  Recent Labs  Lab 06/25/21 1025 06/26/21 0607  NA 139 135  K 3.9 4.2  CL 106 104  CO2 26 23  GLUCOSE 72 103*  BUN 11 10  CREATININE 0.76 0.77  CALCIUM 9.2 9.2  AST 25  29  ALT 15 14  ALKPHOS 78 71  BILITOT 1.1 1.3*   ------------------------------------------------------------------------------------------------------------------ estimated creatinine clearance is 127.1 mL/min (by C-G formula based on SCr of 0.77 mg/dL). ------------------------------------------------------------------------------------------------------------------ No results for input(s): TSH, T4TOTAL, T3FREE, THYROIDAB in the last 72 hours.  Invalid input(s): FREET3  Coagulation profile No results for input(s): INR, PROTIME in the last 168 hours. ------------------------------------------------------------------------------------------------------------------- No results for input(s): DDIMER in the last 72 hours. -------------------------------------------------------------------------------------------------------------------  Cardiac Enzymes No results for input(s): CKMB, TROPONINI, MYOGLOBIN in the last 168 hours.  Invalid input(s): CK ------------------------------------------------------------------------------------------------------------------ No results found for: BNP  ---------------------------------------------------------------------------------------------------------------  Urinalysis    Component Value Date/Time   COLORURINE YELLOW 10/13/2017 0042   APPEARANCEUR CLEAR 10/13/2017 0042   LABSPEC 1.014 10/13/2017 0042   PHURINE 5.0 10/13/2017 0042   GLUCOSEU NEGATIVE 10/13/2017 0042   HGBUR NEGATIVE 10/13/2017 0042   BILIRUBINUR  negative 04/12/2020 1159   KETONESUR negative 11/04/2018 1011   KETONESUR NEGATIVE 10/13/2017 0042   PROTEINUR Positive (A) 04/12/2020 1159   PROTEINUR 30 (A) 10/13/2017 0042   UROBILINOGEN 1.0 04/12/2020 1159   UROBILINOGEN 1.0 04/04/2015 1712   NITRITE Negative 04/12/2020 1159   NITRITE NEGATIVE 10/13/2017 0042   LEUKOCYTESUR Negative 04/12/2020 1159     ----------------------------------------------------------------------------------------------------------------   Imaging Results:    No results found.   Assessment & Plan:  Principal Problem:   Sickle cell pain crisis (HCC) Active Problems:   Mild intermittent asthma without complication   Anemia of chronic disease   Hb Sickle Cell Disease with crisis: Admit patient, start IVF 0 .45% Saline @ 100 mls/hour, start weight based Dilaudid PCA, start IV Toradol 15 mg Q 6 H, Restart oral home pain medications, Monitor vitals very closely, Re-evaluate pain scale regularly, 2 L of Oxygen by [place], Patient will be re-evaluated for pain in the context of function and relationship to baseline as care progresses.  History of anemia of chronic disease:  Hemoglobin 10.6, which is consistent with patient's baseline. There is no clinical indication for blood transfusion at this time.   Chronic pain syndrome: Continue home medication  Mild intermittent asthma: Continue home medication as needed  Thrombocytosis:  Considered to be secondary to sickle cell crisis. Follow closely. Labs in am.    DVT Prophylaxis: Subcut Lovenox   AM Labs Ordered, also please review Full Orders  Family Communication: Admission, patient's condition and plan of care including tests being ordered have been discussed with the patient who indicate understanding and agree with the plan and Code Status.  Code Status: Full Code  Consults called: None    Admission status: Inpatient    Time spent in minutes : 30 minutes  [name]  APRN, MSN, FNP-C Patient Care [facility] [address] [place], [place] [address] [phone]  06/27/2021 at 6:11 AM

## 2021-06-26 NOTE — ED Notes (Signed)
Assumed care pt at this time.  Pt to room 12

## 2021-06-26 NOTE — ED Provider Notes (Addendum)
9:17 AM Signout from [place] PA-C at shift change.  Patient presents with flare of sickle cell pain.  He was seen at the hospital yesterday for pain in his right leg.  He was improved.  He went home and took his last 2 oxycodone without much improvement and pain worsened overnight.  He represents for similar symptoms.  No fevers, chest pain or shortness of breath.  Labs here demonstrate baseline hemoglobin.  Pain continues to be uncontrolled after parenteral medications.  Discussed with [facility] NP.  Patient will be admitted for observation.  Patient initially hesitant to be admitted to the hospital, but does agree to stay.  BP 122/68   Pulse 70   Temp 98.2 F (36.8 C) (Oral)   Resp 20   Wt 67 kg   SpO2 93%   BMI 24.58 kg/m   Exam:  Gen appears uncomfortable, rocking in bed, holding his right upper leg; Heart RRR, nml S1,S2, no m/r/g; Lungs CTAB; Abd soft, NT, no rebound or guarding; Ext 2+ pedal pulses bilaterally, no edema.  CRITICAL CARE Performed by: Renne Crigler PA-C Total critical care time: 40 minutes Critical care time was exclusive of separately billable procedures and treating other patients. Critical care was necessary to treat or prevent imminent or life-threatening deterioration. Critical care was time spent personally by me on the following activities: development of treatment plan with patient and/or surrogate as well as nursing, discussions with consultants, evaluation of patient's response to treatment, examination of patient, obtaining history from patient or surrogate, ordering and performing treatments and interventions, ordering and review of laboratory studies, ordering and review of radiographic studies, pulse oximetry and re-evaluation of patient's condition.      Renne Crigler, PA-C 06/26/21 0919    Gloris Manchester, MD 06/26/21 [phone]

## 2021-06-26 NOTE — ED Notes (Signed)
Report given to [facility] 1611.

## 2021-06-26 NOTE — ED Triage Notes (Signed)
Pt reports with [facility] since yesterday. Pt reports pain in his right leg and right hip.

## 2021-06-26 NOTE — ED Notes (Signed)
Pt given heat packs

## 2021-06-27 DIAGNOSIS — D638 Anemia in other chronic diseases classified elsewhere: Secondary | ICD-10-CM

## 2021-06-27 DIAGNOSIS — D75839 Thrombocytosis, unspecified: Secondary | ICD-10-CM

## 2021-06-27 DIAGNOSIS — D5700 Hb-SS disease with crisis, unspecified: Secondary | ICD-10-CM | POA: Diagnosis not present

## 2021-06-27 LAB — CBC
HCT: 28.1 % — ABNORMAL LOW (ref 39.0–52.0)
Hemoglobin: 10 g/dL — ABNORMAL LOW (ref 13.0–17.0)
MCH: 32.8 pg (ref 26.0–34.0)
MCHC: 35.6 g/dL (ref 30.0–36.0)
MCV: 92.1 fL (ref 80.0–100.0)
Platelets: 513 10*3/uL — ABNORMAL HIGH (ref 150–400)
RBC: 3.05 MIL/uL — ABNORMAL LOW (ref 4.22–5.81)
RDW: 17.3 % — ABNORMAL HIGH (ref 11.5–15.5)
WBC: 9.1 10*3/uL (ref 4.0–10.5)
nRBC: 1 % — ABNORMAL HIGH (ref 0.0–0.2)

## 2021-06-27 LAB — BASIC METABOLIC PANEL
Anion gap: 6 (ref 5–15)
BUN: 8 mg/dL (ref 6–20)
CO2: 28 mmol/L (ref 22–32)
Calcium: 9.3 mg/dL (ref 8.9–10.3)
Chloride: 102 mmol/L (ref 98–111)
Creatinine, Ser: 0.55 mg/dL — ABNORMAL LOW (ref 0.61–1.24)
GFR, Estimated: >60 mL/min (ref >60)
Glucose, Bld: 90 mg/dL (ref 70–99)
Potassium: 4.1 mmol/L (ref 3.5–5.1)
Sodium: 136 mmol/L (ref 135–145)

## 2021-06-27 MED ORDER — OXYCODONE HCL 5 MG PO TABS
10.0000 mg | ORAL_TABLET | ORAL | Status: DC | PRN
  Administered 2021-06-27 – 2021-06-28 (×4): 10 mg via ORAL
  Filled 2021-06-27 (×4): qty 2

## 2021-06-27 MED ORDER — HYDROMORPHONE 1 MG/ML IV SOLN
INTRAVENOUS | Status: DC
  Administered 2021-06-27: 2.5 mg via INTRAVENOUS
  Administered 2021-06-27: 2 mg via INTRAVENOUS
  Administered 2021-06-28: 4 mg via INTRAVENOUS
  Administered 2021-06-28: 30 mg via INTRAVENOUS
  Administered 2021-06-28: 2 mg via INTRAVENOUS
  Administered 2021-06-28: 4 mg via INTRAVENOUS
  Filled 2021-06-27: qty 30

## 2021-06-27 NOTE — Progress Notes (Addendum)
Subjective: Jonathan [name] is a 20 year old male with a medical history significant for sickle cell disease was admitted for sickle cell pain crisis.  Patient continues to complain of pain primarily to right hip with pain intensity of 5/10 characterized as intermittent and aching.  Patient denies any headache, chest pain, urinary symptoms, nausea, vomiting, or diarrhea.  Objective:  Vital signs in last 24 hours:  Vitals:   06/27/21 0355 06/27/21 0803 06/27/21 0849 06/27/21 1125  BP: 129/75 135/85  129/84  Pulse: 65 72  84  Resp: 16 13 14 19   Temp: 98.4 F (36.9 C) 98 F (36.7 C)  98.2 F (36.8 C)  TempSrc: Oral Oral  Oral  SpO2: 94% (!) 88% 93% 94%  Weight:      Height:        Intake/Output from previous day:   Intake/Output Summary (Last 24 hours) at 06/27/2021 1250 Last data filed at 06/27/2021 1100 Gross per 24 hour  Intake 3709.08 ml  Output 300 ml  Net 3409.08 ml    Physical Exam: General: Alert, awake, oriented x3, in no acute distress.  HEENT: [place]/AT PEERL, EOMI Neck: Trachea midline,  no masses, no thyromegal,y no JVD, no carotid bruit OROPHARYNX:  Moist, No exudate/ erythema/lesions.  Heart: Regular rate and rhythm, without murmurs, rubs, gallops, PMI non-displaced, no heaves or thrills on palpation.  Lungs: Clear to auscultation, no wheezing or rhonchi noted. No increased vocal fremitus resonant to percussion  Abdomen: Soft, nontender, nondistended, positive bowel sounds, no masses no hepatosplenomegaly noted..  Neuro: No focal neurological deficits noted cranial nerves II through XII grossly intact. DTRs 2+ bilaterally upper and lower extremities. Strength 5 out of 5 in bilateral upper and lower extremities. Musculoskeletal: No warm swelling or erythema around joints, no spinal tenderness noted. Psychiatric: Patient alert and oriented x3, good insight and cognition, good recent to remote recall. Lymph node survey: No cervical axillary or inguinal lymphadenopathy  noted.  Lab Results:  Basic Metabolic Panel:    Component Value Date/Time   NA 136 06/27/2021 0539   NA 141 05/11/2019 1348   K 4.1 06/27/2021 0539   CL 102 06/27/2021 0539   CO2 28 06/27/2021 0539   BUN 8 06/27/2021 0539   BUN 6 05/11/2019 1348   CREATININE 0.55 (L) 06/27/2021 0539   CREATININE 0.70 05/12/2016 1052   GLUCOSE 90 06/27/2021 0539   CALCIUM 9.3 06/27/2021 0539   CBC:    Component Value Date/Time   WBC 9.1 06/27/2021 0539   HGB 10.0 (L) 06/27/2021 0539   HGB 9.4 (L) 05/11/2019 1348   HCT 28.1 (L) 06/27/2021 0539   HCT 27.1 (L) 05/11/2019 1348   PLT 513 (H) 06/27/2021 0539   PLT 314 05/11/2019 1348   MCV 92.1 06/27/2021 0539   MCV 113 (H) 05/11/2019 1348   NEUTROABS 6.8 06/26/2021 0607   NEUTROABS 0.8 (L) 05/11/2019 1348   LYMPHSABS 1.7 06/26/2021 0607   LYMPHSABS 2.4 05/11/2019 1348   MONOABS 0.6 06/26/2021 0607   EOSABS 0.4 06/26/2021 0607   EOSABS 0.1 05/11/2019 1348   BASOSABS 0.1 06/26/2021 0607   BASOSABS 0.0 05/11/2019 1348    Recent Results (from the past 240 hour(s))  Resp Panel by RT-PCR (Flu A&B, Covid) Nasopharyngeal Swab     Status: None   Collection Time: 06/26/21  3:32 PM   Specimen: Nasopharyngeal Swab; Nasopharyngeal(NP) swabs in vial transport medium  Result Value Ref Range Status   SARS Coronavirus 2 by RT PCR NEGATIVE NEGATIVE Final  Comment: (NOTE) SARS-CoV-2 target nucleic acids are NOT DETECTED.  The SARS-CoV-2 RNA is generally detectable in upper respiratory specimens during the acute phase of infection. The lowest concentration of SARS-CoV-2 viral copies this assay can detect is 138 copies/mL. A negative result does not preclude SARS-Cov-2 infection and should not be used as the sole basis for treatment or other patient management decisions. A negative result may occur with  improper specimen collection/handling, submission of specimen other than nasopharyngeal swab, presence of viral mutation(s) within the areas  targeted by this assay, and inadequate number of viral copies(<138 copies/mL). A negative result must be combined with clinical observations, patient history, and epidemiological information. The expected result is Negative.  Fact Sheet for Patients:  BloggerCourse.com  Fact Sheet for Healthcare Providers:  SeriousBroker.it  This test is no t yet approved or cleared by the [place] FDA and  has been authorized for detection and/or diagnosis of SARS-CoV-2 by FDA under an Emergency Use Authorization (EUA). This EUA will remain  in effect (meaning this test can be used) for the duration of the COVID-19 declaration under Section 564(b)(1) of the Act, 21 U.S.C.section 360bbb-3(b)(1), unless the authorization is terminated  or revoked sooner.       Influenza A by PCR NEGATIVE NEGATIVE Final   Influenza B by PCR NEGATIVE NEGATIVE Final    Comment: (NOTE) The Xpert Xpress SARS-CoV-2/FLU/RSV plus assay is intended as an aid in the diagnosis of influenza from Nasopharyngeal swab specimens and should not be used as a sole basis for treatment. Nasal washings and aspirates are unacceptable for Xpert Xpress SARS-CoV-2/FLU/RSV testing.  Fact Sheet for Patients: BloggerCourse.com  Fact Sheet for Healthcare Providers: SeriousBroker.it  This test is not yet approved or cleared by the [place] FDA and has been authorized for detection and/or diagnosis of SARS-CoV-2 by FDA under an Emergency Use Authorization (EUA). This EUA will remain in effect (meaning this test can be used) for the duration of the COVID-19 declaration under Section 564(b)(1) of the Act, 21 U.S.C. section 360bbb-3(b)(1), unless the authorization is terminated or revoked.  Performed at [facility], [address] [place], [place] [address]     Studies/Results: No results  found.  Medications: Scheduled Meds:  enoxaparin (LOVENOX) injection  40 mg Subcutaneous Q24H   folic acid  1 mg Oral Daily   HYDROmorphone   Intravenous Q4H   hydroxyurea  1,500 mg Oral Daily   ketorolac  15 mg Intravenous Q6H   senna-docusate  1 tablet Oral BID   Continuous Infusions:  sodium chloride 150 mL/hr at 06/27/21 0513   PRN Meds:.naloxone **AND** sodium chloride flush, ondansetron (ZOFRAN) IV, oxyCODONE, polyethylene glycol  Consultants: none  Procedures: none  Antibiotics: none  Assessment/Plan: Principal Problem:   Sickle cell pain crisis (HCC) Active Problems:   Mild intermittent asthma without complication   Anemia of chronic disease   Thrombocytosis  Sickle cell disease with pain crisis: Continue IV fluids at [facility] Weaning IV Dilaudid PCA Continue Toradol 15 mg every 6 hours for total of 5 days Oxycodone 10 mg every 4 hours as needed for severe breakthrough pain Monitor vital signs very closely, reevaluate pain scale regularly, and supplemental oxygen as needed.  Anemia of chronic disease: Hemoglobin is stable and consistent with patient's baseline.  There is no clinical indication for blood transfusion at this time.  Chronic pain syndrome: Continue home medications  Mild intermittent asthma: Continue home medications  Thrombocytosis: Considered to be secondary to sickle cell crisis.  Continue to follow closely.  Labs in AM. Code Status: Full Code Family Communication: N/A Disposition Plan: Not yet ready for discharge  [name]  APRN, MSN, FNP-C [facility] Care [facility] [address] [place], [place] [address] [phone]  If 5PM-8AM, please contact night-coverage.  06/27/2021, 12:50 PM  LOS: 1 day

## 2021-06-28 ENCOUNTER — Encounter: Payer: Self-pay | Admitting: Family Medicine

## 2021-06-28 DIAGNOSIS — D5700 Hb-SS disease with crisis, unspecified: Secondary | ICD-10-CM | POA: Diagnosis not present

## 2021-06-28 LAB — CBC
HCT: 26.8 % — ABNORMAL LOW (ref 39.0–52.0)
Hemoglobin: 9.7 g/dL — ABNORMAL LOW (ref 13.0–17.0)
MCH: 33 pg (ref 26.0–34.0)
MCHC: 36.2 g/dL — ABNORMAL HIGH (ref 30.0–36.0)
MCV: 91.2 fL (ref 80.0–100.0)
Platelets: 482 10*3/uL — ABNORMAL HIGH (ref 150–400)
RBC: 2.94 MIL/uL — ABNORMAL LOW (ref 4.22–5.81)
RDW: 17.5 % — ABNORMAL HIGH (ref 11.5–15.5)
WBC: 9.9 10*3/uL (ref 4.0–10.5)
nRBC: 1.4 % — ABNORMAL HIGH (ref 0.0–0.2)

## 2021-06-28 MED ORDER — IBUPROFEN 800 MG PO TABS: 800.0000 mg | ORAL_TABLET | Freq: Four times a day (QID) | ORAL | 2 refills | Status: DC | PRN

## 2021-06-28 MED ORDER — OXYCODONE HCL 10 MG PO TABS
10.0000 mg | ORAL_TABLET | ORAL | 0 refills | Status: DC | PRN
Start: 2021-06-28 — End: 2021-08-02

## 2021-06-28 NOTE — Progress Notes (Signed)
Pt discharged.  Wasted 24 ML of dilaudid from PCA syringe.  Witnessed with Sharene Skeans, RN.

## 2021-06-28 NOTE — Discharge Summary (Signed)
Physician Discharge Summary  [name] JKK:[id number] DOB: 2001/03/29 DOA: 06/26/2021  PCP: Quentin Angst, MD  Admit date: 06/26/2021  Discharge date: 06/28/2021  Discharge Diagnoses:  Principal Problem:   Sickle cell pain crisis (HCC) Active Problems:   Mild intermittent asthma without complication   Anemia of chronic disease   Thrombocytosis   Discharge Condition: Stable  Disposition:   Follow-up Information     Quentin Angst, MD Follow up.   Specialty: Internal Medicine Contact information: [name] [place] [address] [phone]         Barney Drain, MD .   Specialty: Pediatrics Contact information: [address] [place] [address] [phone]         Massie Maroon, FNP Follow up.   Specialty: Family Medicine Contact information: [address] [place] [address] [phone]                Pt is discharged home in good condition and is to follow up with Quentin Angst, MD this week to have labs evaluated. [name] is instructed to increase activity slowly and balance with rest for the next few days, and use prescribed medication to complete treatment of pain  Diet: Regular Wt Readings from Last 3 Encounters:  06/26/21 65.1 kg  10/23/20 66.6 kg  04/12/20 68 kg    History of present illness:  Jonathan [name]  is a 20 y.o. male is a 20 year old male with a medical history significant for sickle cell disease that presents to the emergency department with complaints of right hip pain that is consistent with his previous sickle cell crisis.  Right hip pain has been present over the past 3-4 days and has been unrelieved by his home oxycodone.  He was treated and evaluated at the sickle cell infusion center on yesterday, he felt better prior to discharge.  However, he says that pain intensity escalated overnight.  He currently rates his pain as 8/10 characterized as constant and  throbbing, occasionally sharp.  He denies any fever, chills, headache, shortness of breath, or chest pain.  No dizziness, urinary symptoms, nausea, vomiting, or diarrhea.  No sick contacts, recent travel, or exposure to COVID-19.  ER course: While in ER, vital signs remained stable.  Reviewed his CBC, hemoglobin 10.6 g/dL and platelets 195,093, otherwise unremarkable.  Complete metabolic panel unremarkable.  Pain persists despite IV Dilaudid, IV Toradol, and IV fluids.  Sickle cell team notified to admit for management of pain crisis.    Hospital Course:  Sickle cell disease with pain crisis: Patient was admitted for sickle cell pain crisis and managed appropriately with IVF, IV Dilaudid via PCA and IV Toradol, as well as other adjunct therapies per sickle cell pain management protocols.  IV Dilaudid PCA weaned appropriately.  Patient transition to home medications.  He states that pain intensity is 3/10 and is requesting discharge home.  He does not have any home pain medications.  Oxycodone 10 mg every 6 hours #30 was sent to patient's pharmacy.  Reviewed PDMP prior to prescribing opiate medications, no inconsistencies noted.  Also, ibuprofen 800 mg #30 was sent to patient's pharmacy.  He is alert, oriented, and ambulating without assistance.  Patient is afebrile and oxygen saturation is 98% on RA.  Patient was therefore discharged home today in a hemodynamically stable condition.   Thelton will follow-up with PCP within 1 week of this discharge. [name] was counseled extensively about nonpharmacologic means of  pain management, patient verbalized understanding and was appreciative of  the care received during this admission.   We discussed the need for good hydration, monitoring of hydration status, avoidance of heat, cold, stress, and infection triggers. We discussed the need to be adherent with taking Hydrea and other home medications. Patient was reminded of the need to seek medical attention  immediately if any symptom of bleeding, anemia, or infection occurs.  Discharge Exam: Vitals:   06/28/21 0745 06/28/21 1017  BP:  135/84  Pulse:  (!) 109  Resp: 18 14  Temp:  98.2 F (36.8 C)  SpO2: 94% 93%   Vitals:   06/28/21 0519 06/28/21 0537 06/28/21 0745 06/28/21 1017  BP: (!) 145/78   135/84  Pulse: 82   (!) 109  Resp: 14 15 18 14   Temp: 99.1 F (37.3 C)   98.2 F (36.8 C)  TempSrc: Oral   Oral  SpO2: 94% 94% 94% 93%  Weight:      Height:        General appearance : Awake, alert, not in any distress. Speech Clear. Not toxic looking HEENT: Atraumatic and Normocephalic, pupils equally reactive to light and accomodation Neck: Supple, no JVD. No cervical lymphadenopathy.  Chest: Good air entry bilaterally, no added sounds  CVS: S1 S2 regular, no murmurs.  Abdomen: Bowel sounds present, Non tender and not distended with no gaurding, rigidity or rebound. Extremities: B/L Lower Ext shows no edema, both legs are warm to touch Neurology: Awake alert, and oriented X 3, CN II-XII intact, Non focal Skin: No Rash  Discharge Instructions  Discharge Instructions     Discharge patient   Complete by: As directed    Discharge disposition: 01-Home or Self Care   Discharge patient date: 06/28/2021      Allergies as of 06/28/2021       Reactions   Allergy Medication [diphenhydramine]         Medication List     TAKE these medications    ergocalciferol 1.25 MG (50000 UT) capsule Commonly known as: Drisdol Take 1 capsule (50,000 Units total) by mouth once a week. What changed: additional instructions   folic acid 1 MG tablet Commonly known as: FOLVITE TAKE 1 TABLET(1 MG) BY MOUTH DAILY What changed: See the new instructions.   hydroxyurea 500 MG capsule Commonly known as: HYDREA Take 3 capsules (1,500 mg total) by mouth daily.   ibuprofen 800 MG tablet Commonly known as: ADVIL Take 1 tablet (800 mg total) by mouth every 6 (six) hours as needed for moderate  pain. What changed: when to take this   Oxycodone HCl 10 MG Tabs Take 1 tablet (10 mg total) by mouth every 4 (four) hours as needed (pain).        The results of significant diagnostics from this hospitalization (including imaging, microbiology, ancillary and laboratory) are listed below for reference.    Significant Diagnostic Studies: No results found.  Microbiology: Recent Results (from the past 240 hour(s))  Resp Panel by RT-PCR (Flu A&B, Covid) Nasopharyngeal Swab     Status: None   Collection Time: 06/26/21  3:32 PM   Specimen: Nasopharyngeal Swab; Nasopharyngeal(NP) swabs in vial transport medium  Result Value Ref Range Status   SARS Coronavirus 2 by RT PCR NEGATIVE NEGATIVE Final    Comment: (NOTE) SARS-CoV-2 target nucleic acids are NOT DETECTED.  The SARS-CoV-2 RNA is generally detectable in upper respiratory specimens during the acute phase of infection. The lowest concentration of SARS-CoV-2 viral copies  this assay can detect is 138 copies/mL. A negative result does not preclude SARS-Cov-2 infection and should not be used as the sole basis for treatment or other patient management decisions. A negative result may occur with  improper specimen collection/handling, submission of specimen other than nasopharyngeal swab, presence of viral mutation(s) within the areas targeted by this assay, and inadequate number of viral copies(<138 copies/mL). A negative result must be combined with clinical observations, patient history, and epidemiological information. The expected result is Negative.  Fact Sheet for Patients:  BloggerCourse.com  Fact Sheet for Healthcare Providers:  SeriousBroker.it  This test is no t yet approved or cleared by the [place] FDA and  has been authorized for detection and/or diagnosis of SARS-CoV-2 by FDA under an Emergency Use Authorization (EUA). This EUA will remain  in effect (meaning  this test can be used) for the duration of the COVID-19 declaration under Section 564(b)(1) of the Act, 21 U.S.C.section 360bbb-3(b)(1), unless the authorization is terminated  or revoked sooner.       Influenza A by PCR NEGATIVE NEGATIVE Final   Influenza B by PCR NEGATIVE NEGATIVE Final    Comment: (NOTE) The Xpert Xpress SARS-CoV-2/FLU/RSV plus assay is intended as an aid in the diagnosis of influenza from Nasopharyngeal swab specimens and should not be used as a sole basis for treatment. Nasal washings and aspirates are unacceptable for Xpert Xpress SARS-CoV-2/FLU/RSV testing.  Fact Sheet for Patients: BloggerCourse.com  Fact Sheet for Healthcare Providers: SeriousBroker.it  This test is not yet approved or cleared by the [place] FDA and has been authorized for detection and/or diagnosis of SARS-CoV-2 by FDA under an Emergency Use Authorization (EUA). This EUA will remain in effect (meaning this test can be used) for the duration of the COVID-19 declaration under Section 564(b)(1) of the Act, 21 U.S.C. section 360bbb-3(b)(1), unless the authorization is terminated or revoked.  Performed at [facility], [address] [place], [place] [address]      Labs: Basic Metabolic Panel: Recent Labs  Lab 06/25/21 1025 06/26/21 0607 06/27/21 0539  NA 139 135 136  K 3.9 4.2 4.1  CL 106 104 102  CO2 26 23 28   GLUCOSE 72 103* 90  BUN 11 10 8   CREATININE 0.76 0.77 0.55*  CALCIUM 9.2 9.2 9.3   Liver Function Tests: Recent Labs  Lab 06/25/21 1025 06/26/21 0607  AST 25 29  ALT 15 14  ALKPHOS 78 71  BILITOT 1.1 1.3*  PROT 7.7 7.8  ALBUMIN 4.4 4.4   No results for input(s): LIPASE, AMYLASE in the last 168 hours. No results for input(s): AMMONIA in the last 168 hours. CBC: Recent Labs  Lab 06/25/21 1025 06/26/21 0607 06/27/21 0539 06/28/21 0523  WBC 10.2 9.6 9.1 9.9  NEUTROABS 5.8 6.8   --   --   HGB 10.8* 10.9* 10.0* 9.7*  HCT 30.6* 31.2* 28.1* 26.8*  MCV 94.2 95.4 92.1 91.2  PLT 576* 540* 513* 482*   Cardiac Enzymes: No results for input(s): CKTOTAL, CKMB, CKMBINDEX, TROPONINI in the last 168 hours. BNP: Invalid input(s): POCBNP CBG: No results for input(s): GLUCAP in the last 168 hours.  Time coordinating discharge: 30 minutes  Signed: 13/03/22  APRN, MSN, FNP-C Patient Care [facility] [address] [place], [address] [place] [phone]  [facility] 06/28/2021, 12:42 PM

## 2021-07-02 ENCOUNTER — Telehealth: Payer: Self-pay

## 2021-07-02 NOTE — Telephone Encounter (Signed)
Transition Care Management Unsuccessful Follow-up Telephone Call  Date of discharge and from where:  06/28/2021 / [facility]   Attempts:  1st Attempt  Reason for unsuccessful TCM follow-up call: HIPAA compliant  Left voice message  George Ina RN,BSN,CCM RN Case Manager [facility] [phone]

## 2021-07-05 ENCOUNTER — Telehealth: Payer: Self-pay

## 2021-07-05 NOTE — Telephone Encounter (Signed)
Transition Care Management Unsuccessful Follow-up Telephone Call  Date of discharge and from where:  06/28/2021 / [facility]   Attempts:  2nd Attempt  Reason for unsuccessful TCM follow-up call: HIPAA compliant.  Left voice message George Ina RN,BSN,CCM RN Case Manager [facility] [phone]

## 2021-07-27 ENCOUNTER — Emergency Department (HOSPITAL_COMMUNITY): Payer: Commercial Managed Care - PPO

## 2021-07-27 ENCOUNTER — Inpatient Hospital Stay (HOSPITAL_COMMUNITY)
Admission: EM | Admit: 2021-07-27 | Discharge: 2021-08-02 | DRG: 811 | Disposition: A | Discharge status: Home / Self Care | Payer: Commercial Managed Care - PPO | Attending: Internal Medicine | Admitting: Internal Medicine

## 2021-07-27 ENCOUNTER — Other Ambulatory Visit: Payer: Self-pay

## 2021-07-27 DIAGNOSIS — D5700 Hb-SS disease with crisis, unspecified: Secondary | ICD-10-CM | POA: Diagnosis not present

## 2021-07-27 DIAGNOSIS — U071 COVID-19: Secondary | ICD-10-CM | POA: Diagnosis not present

## 2021-07-27 DIAGNOSIS — Z825 Family history of asthma and other chronic lower respiratory diseases: Secondary | ICD-10-CM

## 2021-07-27 DIAGNOSIS — D638 Anemia in other chronic diseases classified elsewhere: Secondary | ICD-10-CM | POA: Diagnosis not present

## 2021-07-27 DIAGNOSIS — D72829 Elevated white blood cell count, unspecified: Secondary | ICD-10-CM | POA: Diagnosis present

## 2021-07-27 DIAGNOSIS — J449 Chronic obstructive pulmonary disease, unspecified: Secondary | ICD-10-CM | POA: Diagnosis present

## 2021-07-27 DIAGNOSIS — R439 Unspecified disturbances of smell and taste: Secondary | ICD-10-CM | POA: Diagnosis present

## 2021-07-27 DIAGNOSIS — Z79899 Other long term (current) drug therapy: Secondary | ICD-10-CM

## 2021-07-27 DIAGNOSIS — Z8249 Family history of ischemic heart disease and other diseases of the circulatory system: Secondary | ICD-10-CM

## 2021-07-27 DIAGNOSIS — Z96642 Presence of left artificial hip joint: Secondary | ICD-10-CM | POA: Diagnosis present

## 2021-07-27 DIAGNOSIS — J45909 Unspecified asthma, uncomplicated: Secondary | ICD-10-CM | POA: Diagnosis present

## 2021-07-27 DIAGNOSIS — I10 Essential (primary) hypertension: Secondary | ICD-10-CM | POA: Diagnosis present

## 2021-07-27 DIAGNOSIS — Z888 Allergy status to other drugs, medicaments and biological substances status: Secondary | ICD-10-CM

## 2021-07-27 DIAGNOSIS — Z833 Family history of diabetes mellitus: Secondary | ICD-10-CM

## 2021-07-27 DIAGNOSIS — Z9081 Acquired absence of spleen: Secondary | ICD-10-CM

## 2021-07-27 LAB — COMPREHENSIVE METABOLIC PANEL
ALT: 30 U/L (ref 0–44)
AST: 39 U/L (ref 15–41)
Albumin: 4.8 g/dL (ref 3.5–5.0)
Alkaline Phosphatase: 93 U/L (ref 38–126)
Anion gap: 9 (ref 5–15)
BUN: 10 mg/dL (ref 6–20)
CO2: 23 mmol/L (ref 22–32)
Calcium: 9.3 mg/dL (ref 8.9–10.3)
Chloride: 104 mmol/L (ref 98–111)
Creatinine, Ser: 0.84 mg/dL (ref 0.61–1.24)
GFR, Estimated: >60 mL/min (ref >60)
Glucose, Bld: 104 mg/dL — ABNORMAL HIGH (ref 70–99)
Potassium: 4.3 mmol/L (ref 3.5–5.1)
Sodium: 136 mmol/L (ref 135–145)
Total Bilirubin: 1.3 mg/dL — ABNORMAL HIGH (ref 0.3–1.2)
Total Protein: 8.5 g/dL — ABNORMAL HIGH (ref 6.5–8.1)

## 2021-07-27 LAB — CBC WITH DIFFERENTIAL/PLATELET
Abs Immature Granulocytes: 0.4 10*3/uL — ABNORMAL HIGH (ref 0.00–0.07)
Basophils Absolute: 0.1 10*3/uL (ref 0.0–0.1)
Basophils Relative: 1 %
Eosinophils Absolute: 0 10*3/uL (ref 0.0–0.5)
Eosinophils Relative: 0 %
HCT: 31.4 % — ABNORMAL LOW (ref 39.0–52.0)
Hemoglobin: 11.1 g/dL — ABNORMAL LOW (ref 13.0–17.0)
Lymphocytes Relative: 21 %
Lymphs Abs: 2.6 10*3/uL (ref 0.7–4.0)
MCH: 32.3 pg (ref 26.0–34.0)
MCHC: 35.4 g/dL (ref 30.0–36.0)
MCV: 91.3 fL (ref 80.0–100.0)
Metamyelocytes Relative: 1 %
Monocytes Absolute: 0.9 10*3/uL (ref 0.1–1.0)
Monocytes Relative: 7 %
Myelocytes: 2 %
Neutro Abs: 8.3 10*3/uL — ABNORMAL HIGH (ref 1.7–7.7)
Neutrophils Relative %: 68 %
Platelet Morphology: NORMAL
Platelets: 360 10*3/uL (ref 150–400)
RBC: 3.44 MIL/uL — ABNORMAL LOW (ref 4.22–5.81)
RDW: 18.1 % — ABNORMAL HIGH (ref 11.5–15.5)
WBC: 12.2 10*3/uL — ABNORMAL HIGH (ref 4.0–10.5)
nRBC: 3.6 % — ABNORMAL HIGH (ref 0.0–0.2)

## 2021-07-27 LAB — RESP PANEL BY RT-PCR (FLU A&B, COVID) ARPGX2
Influenza A by PCR: NEGATIVE
Influenza B by PCR: NEGATIVE
SARS Coronavirus 2 by RT PCR: POSITIVE — AB

## 2021-07-27 LAB — RETICULOCYTES
Immature Retic Fract: 32.2 % — ABNORMAL HIGH (ref 2.3–15.9)
RBC.: 3.46 MIL/uL — ABNORMAL LOW (ref 4.22–5.81)
Retic Count, Absolute: 179.9 10*3/uL (ref 19.0–186.0)
Retic Ct Pct: 5.2 % — ABNORMAL HIGH (ref 0.4–3.1)

## 2021-07-27 MED ORDER — HYDROMORPHONE 1 MG/ML IV SOLN
INTRAVENOUS | Status: DC
  Administered 2021-07-28: 1.5 mg via INTRAVENOUS
  Administered 2021-07-28: 30 mg via INTRAVENOUS
  Filled 2021-07-27: qty 30

## 2021-07-27 MED ORDER — NALOXONE HCL 0.4 MG/ML IJ SOLN: 0.4000 mg | INTRAMUSCULAR | Status: DC | PRN

## 2021-07-27 MED ORDER — FOLIC ACID 1 MG PO TABS
1.0000 mg | ORAL_TABLET | Freq: Every day | ORAL | Status: DC
  Administered 2021-07-28 – 2021-08-02 (×6): 1 mg via ORAL
  Filled 2021-07-27 (×6): qty 1

## 2021-07-27 MED ORDER — KETOROLAC TROMETHAMINE 15 MG/ML IJ SOLN
15.0000 mg | INTRAMUSCULAR | Status: AC
  Administered 2021-07-27: 15 mg via INTRAVENOUS
  Filled 2021-07-27: qty 1

## 2021-07-27 MED ORDER — SODIUM CHLORIDE 0.45 % IV SOLN
INTRAVENOUS | Status: DC
Start: 2021-07-27 — End: 2021-07-28

## 2021-07-27 MED ORDER — ONDANSETRON HCL 4 MG/2ML IJ SOLN
4.0000 mg | INTRAMUSCULAR | Status: DC | PRN
  Administered 2021-07-27 – 2021-07-29 (×3): 4 mg via INTRAVENOUS
  Filled 2021-07-27 (×3): qty 2

## 2021-07-27 MED ORDER — HYDROMORPHONE HCL 2 MG/ML IJ SOLN
2.0000 mg | Freq: Once | INTRAMUSCULAR | Status: AC
  Administered 2021-07-27: 2 mg via INTRAVENOUS
  Filled 2021-07-27: qty 1

## 2021-07-27 MED ORDER — HYDROMORPHONE HCL 2 MG/ML IJ SOLN
2.0000 mg | INTRAMUSCULAR | Status: DC | PRN
  Administered 2021-07-27: 2 mg via INTRAVENOUS
  Filled 2021-07-27: qty 1

## 2021-07-27 MED ORDER — HYDROMORPHONE HCL 2 MG/ML IJ SOLN
2.0000 mg | INTRAMUSCULAR | Status: AC
  Administered 2021-07-27: 2 mg via INTRAVENOUS
  Filled 2021-07-27: qty 1

## 2021-07-27 MED ORDER — OXYCODONE HCL 5 MG PO TABS
10.0000 mg | ORAL_TABLET | ORAL | Status: AC
  Administered 2021-07-27 – 2021-07-28 (×5): 10 mg via ORAL
  Filled 2021-07-27 (×5): qty 2

## 2021-07-27 MED ORDER — POLYETHYLENE GLYCOL 3350 17 G PO PACK: 17.0000 g | PACK | Freq: Every day | ORAL | Status: DC | PRN

## 2021-07-27 MED ORDER — SENNOSIDES-DOCUSATE SODIUM 8.6-50 MG PO TABS
1.0000 | ORAL_TABLET | Freq: Two times a day (BID) | ORAL | Status: DC
  Administered 2021-07-27 – 2021-08-02 (×12): 1 via ORAL
  Filled 2021-07-27 (×12): qty 1

## 2021-07-27 MED ORDER — HYDROMORPHONE HCL 1 MG/ML IJ SOLN: 1.0000 mg | INTRAMUSCULAR | Status: DC | PRN

## 2021-07-27 MED ORDER — ERGOCALCIFEROL 1.25 MG (50000 UT) PO CAPS: 50000.0000 [IU] | ORAL_CAPSULE | ORAL | Status: DC

## 2021-07-27 MED ORDER — HYDROMORPHONE HCL 2 MG/ML IJ SOLN
3.0000 mg | INTRAMUSCULAR | Status: DC | PRN
  Administered 2021-07-28 (×5): 3 mg via INTRAVENOUS
  Filled 2021-07-27 (×5): qty 2

## 2021-07-27 MED ORDER — KETAMINE HCL 50 MG/5ML IJ SOSY
0.3000 mg/kg | PREFILLED_SYRINGE | Freq: Once | INTRAMUSCULAR | Status: AC
  Administered 2021-07-27: 20 mg via INTRAVENOUS
  Filled 2021-07-27: qty 5

## 2021-07-27 MED ORDER — HYDROXYUREA 500 MG PO CAPS
1500.0000 mg | ORAL_CAPSULE | Freq: Every day | ORAL | Status: DC
  Administered 2021-07-28 – 2021-08-02 (×6): 1500 mg via ORAL
  Filled 2021-07-27 (×6): qty 3

## 2021-07-27 MED ORDER — ENOXAPARIN SODIUM 40 MG/0.4ML IJ SOSY
40.0000 mg | PREFILLED_SYRINGE | INTRAMUSCULAR | Status: DC
  Administered 2021-07-27 – 2021-08-01 (×6): 40 mg via SUBCUTANEOUS
  Filled 2021-07-27 (×6): qty 0.4

## 2021-07-27 MED ORDER — HYDROMORPHONE HCL 2 MG/ML IJ SOLN: 2.0000 mg | INTRAMUSCULAR | Status: DC | PRN

## 2021-07-27 MED ORDER — SODIUM CHLORIDE 0.9% FLUSH: 9.0000 mL | INTRAVENOUS | Status: DC | PRN

## 2021-07-27 MED ORDER — ACETAMINOPHEN 500 MG PO TABS
1000.0000 mg | ORAL_TABLET | Freq: Once | ORAL | Status: AC
  Administered 2021-07-27: 1000 mg via ORAL
  Filled 2021-07-27: qty 2

## 2021-07-27 MED ORDER — OXYCODONE HCL 5 MG PO TABS: 10.0000 mg | ORAL_TABLET | ORAL | Status: DC | PRN

## 2021-07-27 MED ORDER — KETOROLAC TROMETHAMINE 15 MG/ML IJ SOLN
15.0000 mg | Freq: Four times a day (QID) | INTRAMUSCULAR | Status: DC
  Administered 2021-07-27: 15 mg via INTRAVENOUS
  Filled 2021-07-27: qty 1

## 2021-07-27 MED ORDER — VITAMIN D (ERGOCALCIFEROL) 1.25 MG (50000 UNIT) PO CAPS
50000.0000 [IU] | ORAL_CAPSULE | ORAL | Status: DC
  Administered 2021-07-28: 10:00:00 50000 [IU] via ORAL
  Filled 2021-07-27: qty 1

## 2021-07-27 NOTE — ED Notes (Signed)
Pt asking if girlfriend can come back into room. Pt made aware of covid visitor policy and that how she was acting earlier in the evening will not be tolerated and she will not be able to come back. Pt asked if she could come in to get her belongings. This RN stated that a staff member could walk them out to her.   Staff member from registration took pt's girlfriend's belongings to her at the front desk.

## 2021-07-27 NOTE — ED Provider Notes (Signed)
[facility]-EMERGENCY DEPT Provider Note   CSN: [id number] Arrival date & time: 07/27/21  1353     History Chief Complaint  Patient presents with   Sickle Cell Pain Crisis    Jonathan [name] is a 20 y.o. male.   Sickle Cell Pain Crisis Associated symptoms: chest pain   Associated symptoms: no cough, no fever, no shortness of breath, no sore throat and no vomiting    20 year old male with a history of sickle cell disease presenting with concern for typical sickle cell pain.  He states he does have a history of acute chest syndrome.  He presents with sharp, shooting pain in his lower back and chest that is typical sickle cell pain for him, 10 out of 10 in severity, came on suddenly.  He states that he ran out of his home oxycodone and is only been taking Tylenol and ibuprofen.  He denies any fever or chills, cough or shortness of breath.  His chest pain is midsternal and sharp.  Past Medical History:  Diagnosis Date   Acute chest syndrome(517.3) 2013   march 2013, Sept 2013   Asthma    Avascular necrosis of bone of left hip (HCC)    Hb-SS disease with vaso-occlusive crisis (HCC) 07/28/2011   Sickle cell disease with crisis (HCC)    Sickle cell disease, type SS (HCC)    Sickle cell pain crisis (HCC) 10/02/2012   Vaso-occlusive sickle cell crisis (HCC) 11/09/2011    Patient Active Problem List   Diagnosis Date Noted   Sickle cell crisis (HCC) 07/27/2021   Anemia of chronic disease 06/27/2021   Thrombocytosis 06/27/2021   Mild intermittent asthma without complication 11/11/2020   Sickle cell disease with crisis (HCC) 10/23/2020   Sickle cell pain crisis (HCC) 08/16/2020   Proteinuria 04/14/2020   Influenza A 10/13/2017   Sickle cell disease (HCC) 04/27/2017   Failed hearing screening 03/25/2016   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   Myopic astigmatism 03/16/2015   Avascular necrosis of bone of left hip (HCC) 03/24/2013   Chronic obstruct airways  disease (HCC) 03/15/2013   Snoring 04/08/2012   Hypertension 11/11/2011   Reactive airway disease 11/09/2011   Sickle cell disease, 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/2011    Past Surgical History:  Procedure Laterality Date   ADENOIDECTOMY     SPLENECTOMY     TONSILLECTOMY     TONSILLECTOMY AND ADENOIDECTOMY     TOTAL HIP ARTHROPLASTY Left 03/07/2016   UMBILICAL HERNIA REPAIR         Family History  Problem Relation Age of Onset   Hypertension Mother    Hypertension Maternal Grandmother    Hypertension Maternal Grandfather    Asthma Paternal Grandfather    Diabetes Paternal Grandfather     Social History   Tobacco Use   Smoking status: Never   Smokeless tobacco: Never  Vaping Use   Vaping Use: Never used  Substance Use Topics   Alcohol use: No    Alcohol/week: 0.0 standard drinks   Drug use: No    Home Medications Prior to Admission medications   Medication Sig Start Date End Date Taking? Authorizing Provider  ergocalciferol (DRISDOL) 1.25 MG (50000 UT) capsule Take 1 capsule (50,000 Units total) by mouth once a week. Patient taking differently: Take 50,000 Units by mouth once a week. Sunday's 04/12/20   Barbette Merino, NP  folic acid (FOLVITE) 1 MG tablet TAKE 1 TABLET(1 MG) BY MOUTH DAILY Patient taking differently: Take 1  mg by mouth daily. 08/14/20   Vevelyn Francois, NP  hydroxyurea (HYDREA) 500 MG capsule Take 3 capsules (1,500 mg total) by mouth daily. 12/20/20   Dorena Dew, FNP  ibuprofen (ADVIL) 800 MG tablet Take 1 tablet (800 mg total) by mouth every 6 (six) hours as needed for moderate pain. 06/28/21   Dorena Dew, FNP  Oxycodone HCl 10 MG TABS Take 1 tablet (10 mg total) by mouth every 4 (four) hours as needed (pain). 06/28/21   Dorena Dew, FNP    Allergies    Allergy medication [diphenhydramine]  Review of Systems   Review of Systems  Constitutional:  Negative for chills and fever.  HENT:  Negative for ear pain and sore throat.   Eyes:   Negative for pain and visual disturbance.  Respiratory:  Negative for cough and shortness of breath.   Cardiovascular:  Positive for chest pain. Negative for palpitations.  Gastrointestinal:  Negative for abdominal pain and vomiting.  Genitourinary:  Negative for dysuria and hematuria.  Musculoskeletal:  Positive for back pain. Negative for arthralgias.  Skin:  Negative for color change and rash.  Neurological:  Negative for seizures and syncope.  All other systems reviewed and are negative.  Physical Exam Updated Vital Signs BP 128/90   Pulse 84   Temp 98 F (36.7 C) (Oral)   Resp 17   Ht 5\' 5"  (1.651 m)   Wt 65.1 kg   SpO2 98%   BMI 23.88 kg/m   Physical Exam Vitals and nursing note reviewed.  Constitutional:      General: He is not in acute distress.    Appearance: He is well-developed.     Comments: Uncomfortable appearing but in no obvious distress  HENT:     Head: Normocephalic and atraumatic.  Eyes:     Conjunctiva/sclera: Conjunctivae normal.     Pupils: Pupils are equal, round, and reactive to light.  Cardiovascular:     Rate and Rhythm: Normal rate and regular rhythm.     Heart sounds: No murmur heard. Pulmonary:     Effort: Pulmonary effort is normal. No respiratory distress.     Breath sounds: Normal breath sounds.  Abdominal:     General: There is no distension.     Palpations: Abdomen is soft.     Tenderness: There is no abdominal tenderness. There is no guarding.  Musculoskeletal:        General: No swelling, deformity or signs of injury.     Cervical back: Neck supple.  Skin:    General: Skin is warm and dry.     Capillary Refill: Capillary refill takes less than 2 seconds.     Findings: No lesion or rash.  Neurological:     General: No focal deficit present.     Mental Status: He is alert. Mental status is at baseline.  Psychiatric:        Mood and Affect: Mood normal.    ED Results / Procedures / Treatments   Labs (all labs ordered are  listed, but only abnormal results are displayed) Labs Reviewed  RESP PANEL BY RT-PCR (FLU A&B, COVID) ARPGX2 - Abnormal; Notable for the following components:      Result Value   SARS Coronavirus 2 by RT PCR POSITIVE (*)    All other components within normal limits  CBC WITH DIFFERENTIAL/PLATELET - Abnormal; Notable for the following components:   WBC 12.2 (*)    RBC 3.44 (*)    Hemoglobin  11.1 (*)    HCT 31.4 (*)    RDW 18.1 (*)    nRBC 3.6 (*)    Neutro Abs 8.3 (*)    Abs Immature Granulocytes 0.40 (*)    All other components within normal limits  RETICULOCYTES - Abnormal; Notable for the following components:   Retic Ct Pct 5.2 (*)    RBC. 3.46 (*)    Immature Retic Fract 32.2 (*)    All other components within normal limits  COMPREHENSIVE METABOLIC PANEL - Abnormal; Notable for the following components:   Glucose, Bld 104 (*)    Total Protein 8.5 (*)    Total Bilirubin 1.3 (*)    All other components within normal limits    EKG None  Radiology DG Chest 2 View  Result Date: 07/27/2021 CLINICAL DATA:  Chest pain, sickle cell disease EXAM: CHEST - 2 VIEW COMPARISON:  Previous studies including the examination of 04/11/2020 FINDINGS: Cardiac size is within normal limits. There are no signs of pulmonary edema. There are no focal pulmonary infiltrates. There is no significant effusion or pneumothorax. IMPRESSION: No active cardiopulmonary disease. Electronically Signed   By: Elmer Picker M.D.   On: 07/27/2021 17:05    Procedures Procedures   Medications Ordered in ED Medications  ondansetron (ZOFRAN) injection 4 mg (4 mg Intravenous Given 07/27/21 1536)  senna-docusate (Senokot-S) tablet 1 tablet (has no administration in time range)  polyethylene glycol (MIRALAX / GLYCOLAX) packet 17 g (has no administration in time range)  enoxaparin (LOVENOX) injection 40 mg (has no administration in time range)  ketorolac (TORADOL) 15 MG/ML injection 15 mg (15 mg Intravenous Given  07/27/21 1940)  0.45 % sodium chloride infusion ( Intravenous New Bag/Given 07/27/21 1938)  oxyCODONE (Oxy IR/ROXICODONE) immediate release tablet 10 mg (has no administration in time range)  HYDROmorphone (DILAUDID) injection 2 mg (2 mg Intravenous Given 07/27/21 1944)  naloxone (NARCAN) injection 0.4 mg (has no administration in time range)    And  sodium chloride flush (NS) 0.9 % injection 9 mL (has no administration in time range)  HYDROmorphone (DILAUDID) 1 mg/mL PCA injection (0 mg Intravenous Hold 07/27/21 1932)  ketorolac (TORADOL) 15 MG/ML injection 15 mg (15 mg Intravenous Given 07/27/21 1536)  HYDROmorphone (DILAUDID) injection 2 mg (2 mg Intravenous Given 07/27/21 1608)  HYDROmorphone (DILAUDID) injection 2 mg (2 mg Intravenous Given 07/27/21 1645)  ketamine 50 mg in normal saline 5 mL (10 mg/mL) syringe (20 mg Intravenous Given 07/27/21 1537)  acetaminophen (TYLENOL) tablet 1,000 mg (1,000 mg Oral Given 07/27/21 1940)    ED Course  I have reviewed the triage vital signs and the nursing notes.  Pertinent labs & imaging results that were available during my care of the patient were reviewed by me and considered in my medical decision making (see chart for details).    MDM Rules/Calculators/A&P                           20 year old male with a history of sickle cell disease presenting with concern for typical sickle cell pain.  He states he does have a history of acute chest syndrome.  He presents with sharp, shooting pain in his lower back and chest that is typical sickle cell pain for him, 10 out of 10 in severity, came on suddenly.  He states that he ran out of his home oxycodone and is only been taking Tylenol and ibuprofen.  He denies any fever or chills, cough  or shortness of breath.  His chest pain is midsternal and sharp.  On arrival, the patient was afebrile, mildly hypertensive BP 143/95, not tachycardic or tachypneic, saturating well on room air.  Physical exam significant for  lungs that were clear to auscultation bilaterally, and uncomfortable appearing gentleman but in no obvious distress.  Will obtain EKG, chest x-ray, screening labs to his COVID-19 and influenza PCR testing, administer typical sickle cell pain regimen for the patient and reassess.  Of note, the patient does have a Benadryl allergy so we will hold on this medication.  Work-up significant for COVID-19 PCR testing which resulted positive, mild leukocytosis 12.2, chest x-ray without acute cardiac or pulmonary abnormality.  Patient administered Dilaudid, Toradol and ketamine with no significant relief in pain.  Lungs were clear to auscultation bilaterally with clear chest x-ray, low concern for acute chest at this time.  Patient without an oxygen requirement.  Due to concern for sickle cell pain crisis in the setting of his COVID-19 infection, hospitalist medicine was consulted for admission.  Final Clinical Impression(s) / ED Diagnoses Final diagnoses:  Sickle cell pain crisis ([place])  COVID-19    Rx / DC Orders ED Discharge Orders     None        Regan Lemming, MD 07/27/21 [id number]

## 2021-07-27 NOTE — ED Triage Notes (Signed)
Patient reports sickle cell pain crisis, lower back pain, 10/10, has taken ibuprofen, says he has not had home meds for two months.

## 2021-07-27 NOTE — ED Notes (Signed)
Pt's family at nurse's station on FaceTime with pt's mother yelling and cussing at this RN about the pain medication schedule and why the pt wasn't getting his pain medications more often. Family member made aware by this RN that I will talk to the mother on the phone, but not on facetime. Pt's family member updated on pain medication schedule, why medications were ordered at the time they were, and that this RN will speak to the admitting MD about pain medication to be given more often.   Pt's family member escorted out by security due to escalation of behavior.

## 2021-07-27 NOTE — H&P (Addendum)
History and Physical    Jonathan [name] WOE:[id number] DOB: 2001-05-05 DOA: 07/27/2021  PCP: Quentin Angst, MD  Patient coming from: Home  I have personally briefly reviewed patient's old medical records in [facility]  Chief Complaint: Back and leg pain  HPI: Jonathan [name] is a 20 y.o. male with medical history significant for sickle cell disease, anemia and asthma who presents with concerns of back and leg pain.  Patient tested positive for COVID at home about 4 days ago.  Had cough, congestion and loss of taste since.  Feels symptom is proving.  However noted worsening sickle cell pain today.  Reports normally taking oxycodone and ibuprofen but ran out of oxycodone for the past month. Has mild left sided chest pain. No shortness of breath. Last admitted a month ago for sickle cell pain crisis.   ED Course: He was afebrile, hypertensive with BP of 170/90, tachycardic with heart rate of 109. WBC of 12.2, hemoglobin of 1.1, platelet of 360 Sodium of 136, K of 4.3, creatinine of 0.84, BG of 104  COVID PCR positive.  Chest x-ray negative.  EKG reassuring with sinus tachycardia.  Review of Systems: Constitutional: No Weight Change, No Fever ENT/Mouth: No sore throat, No Rhinorrhea Eyes: Vision Changes Cardiovascular: + Chest Pain, no SOB Respiratory: No Cough, No Sputum, No Wheezing, no Dyspnea  Gastrointestinal: No Nausea, No Vomiting, No Diarrhea, No Constipation, No Pain Genitourinary: no Urinary Incontinence Musculoskeletal: No Arthralgias, No Myalgias Skin: No Skin Lesions, No Pruritus, Neuro: no Weakness, No Numbness Psych: No Anxiety/Panic, No Depression, no decrease appetite Heme/Lymph: No Bruising, No Bleeding  Past Medical History:  Diagnosis Date   Acute chest syndrome(517.3) 2013   march 2013, Sept 2013   Asthma    Avascular necrosis of bone of left hip (HCC)    Hb-SS disease with vaso-occlusive crisis (HCC) 07/28/2011   Sickle cell disease with  crisis (HCC)    Sickle cell disease, type SS (HCC)    Sickle cell pain crisis (HCC) 10/02/2012   Vaso-occlusive sickle cell crisis (HCC) 11/09/2011    Past Surgical History:  Procedure Laterality Date   ADENOIDECTOMY     SPLENECTOMY     TONSILLECTOMY     TONSILLECTOMY AND ADENOIDECTOMY     TOTAL HIP ARTHROPLASTY Left 03/07/2016   UMBILICAL HERNIA REPAIR       reports that he has never smoked. He has never used smokeless tobacco. He reports that he does not drink alcohol and does not use drugs. Social History  Allergies  Allergen Reactions   Allergy Medication [Diphenhydramine]     Family History  Problem Relation Age of Onset   Hypertension Mother    Hypertension Maternal Grandmother    Hypertension Maternal Grandfather    Asthma Paternal Grandfather    Diabetes Paternal Grandfather      Prior to Admission medications   Medication Sig Start Date End Date Taking? Authorizing Provider  ergocalciferol (DRISDOL) 1.25 MG (50000 UT) capsule Take 1 capsule (50,000 Units total) by mouth once a week. Patient taking differently: Take 50,000 Units by mouth once a week. Sunday's 04/12/20   Barbette Merino, NP  folic acid (FOLVITE) 1 MG tablet TAKE 1 TABLET(1 MG) BY MOUTH DAILY Patient taking differently: Take 1 mg by mouth daily. 08/14/20   Barbette Merino, NP  hydroxyurea (HYDREA) 500 MG capsule Take 3 capsules (1,500 mg total) by mouth daily. 12/20/20   Massie Maroon, FNP  ibuprofen (ADVIL) 800 MG tablet Take  1 tablet (800 mg total) by mouth every 6 (six) hours as needed for moderate pain. 06/28/21   Massie Maroon, FNP  Oxycodone HCl 10 MG TABS Take 1 tablet (10 mg total) by mouth every 4 (four) hours as needed (pain). 06/28/21   Massie Maroon, FNP    Physical Exam: Vitals:   07/27/21 1715 07/27/21 1800 07/27/21 1940 07/27/21 2000  BP: (!) 146/80 125/86 128/90 (!) 142/97  Pulse: 80 81 84 66  Resp: 16 18 17    Temp:      TempSrc:      SpO2: 93% 98% 98% 95%  Weight:       Height:        Constitutional: NAD, calm, in discomfort due to sickle cell pain Vitals:   07/27/21 1715 07/27/21 1800 07/27/21 1940 07/27/21 2000  BP: (!) 146/80 125/86 128/90 (!) 142/97  Pulse: 80 81 84 66  Resp: 16 18 17    Temp:      TempSrc:      SpO2: 93% 98% 98% 95%  Weight:      Height:       Eyes: lids and conjunctivae normal ENMT: Mucous membranes are moist. Neck: normal, supple, no masses, no thyromegaly Respiratory: clear to auscultation bilaterally, no wheezing, no crackles. Normal respiratory effort.  Cardiovascular: Regular rate and rhythm, no murmurs / rubs / gallops. No extremity edema. Abdomen: no tenderness, Bowel sounds positive.  Musculoskeletal: no clubbing / cyanosis. No joint deformity upper and lower extremities.  Skin: no rashes, lesions, ulcers.  Neurologic: CN 2-12 grossly intact.  Psychiatric: Normal judgment and insight. Alert and oriented x 3. Normal mood.     Labs on Admission: I have personally reviewed following labs and imaging studies  CBC: Recent Labs  Lab 07/27/21 1504  WBC 12.2*  NEUTROABS 8.3*  HGB 11.1*  HCT 31.4*  MCV 91.3  PLT 360   Basic Metabolic Panel: Recent Labs  Lab 07/27/21 1504  NA 136  K 4.3  CL 104  CO2 23  GLUCOSE 104*  BUN 10  CREATININE 0.84  CALCIUM 9.3   GFR: Estimated Creatinine Clearance: 120 mL/min (by C-G formula based on SCr of 0.84 mg/dL). Liver Function Tests: Recent Labs  Lab 07/27/21 1504  AST 39  ALT 30  ALKPHOS 93  BILITOT 1.3*  PROT 8.5*  ALBUMIN 4.8   No results for input(s): LIPASE, AMYLASE in the last 168 hours. No results for input(s): AMMONIA in the last 168 hours. Coagulation Profile: No results for input(s): INR, PROTIME in the last 168 hours. Cardiac Enzymes: No results for input(s): CKTOTAL, CKMB, CKMBINDEX, TROPONINI in the last 168 hours. BNP (last 3 results) No results for input(s): PROBNP in the last 8760 hours. HbA1C: No results for input(s): HGBA1C in the  last 72 hours. CBG: No results for input(s): GLUCAP in the last 168 hours. Lipid Profile: No results for input(s): CHOL, HDL, LDLCALC, TRIG, CHOLHDL, LDLDIRECT in the last 72 hours. Thyroid Function Tests: No results for input(s): TSH, T4TOTAL, FREET4, T3FREE, THYROIDAB in the last 72 hours. Anemia Panel: Recent Labs    07/27/21 1504  RETICCTPCT 5.2*   Urine analysis:    Component Value Date/Time   COLORURINE YELLOW 10/13/2017 0042   APPEARANCEUR CLEAR 10/13/2017 0042   LABSPEC 1.014 10/13/2017 0042   PHURINE 5.0 10/13/2017 0042   GLUCOSEU NEGATIVE 10/13/2017 0042   HGBUR NEGATIVE 10/13/2017 0042   BILIRUBINUR negative 04/12/2020 1159   KETONESUR negative 11/04/2018 1011   KETONESUR NEGATIVE 10/13/2017  0042   PROTEINUR Positive (A) 04/12/2020 1159   PROTEINUR 30 (A) 10/13/2017 0042   UROBILINOGEN 1.0 04/12/2020 1159   UROBILINOGEN 1.0 04/04/2015 1712   NITRITE Negative 04/12/2020 1159   NITRITE NEGATIVE 10/13/2017 0042   LEUKOCYTESUR Negative 04/12/2020 1159    Radiological Exams on Admission: DG Chest 2 View  Result Date: 07/27/2021 CLINICAL DATA:  Chest pain, sickle cell disease EXAM: CHEST - 2 VIEW COMPARISON:  Previous studies including the examination of 04/11/2020 FINDINGS: Cardiac size is within normal limits. There are no signs of pulmonary edema. There are no focal pulmonary infiltrates. There is no significant effusion or pneumothorax. IMPRESSION: No active cardiopulmonary disease. Electronically Signed   By: Ernie Avena M.D.   On: 07/27/2021 17:05      Assessment/Plan  Sickle cell pain crisis Start 0.45% IV fluids  IV Dilaudid via PCA with settings of 0.5mg , 10-minute lockout with max of 1mg /hr -While in ED before PCA pump can be initiated, will schedule his home oxycodone 10mg  q4hr x 1 day. Initially initiated 2 mg IV Dilaudid q3hr hour but pain remained uncontrolled at 7/10 pain scale. Pt refused to have any Toradol as he states they do not help and  wants IV dilaudid increased. Family also demanded more medications from RN and later had to be escorted out.  -Will keep IV dilaudid at 3mg  q2hr and will not increase dose or frequency any further. Discussed with him the dangers of overdose and that we need to balance benefit vs risk of medications.  Monitor vital signs closely and re-evaluate pain scale  Pt has mild chest pain but both CXR and EKG reassuring without acute findings. Suspect due to ongoing COVID infection with cough.  Anemia Stable Hgb at 11.1 with baseline around 9-10  COVID-19 viral infection -tested positive at home on 11/30 -symptoms of loss of smell/taste, cough but no dyspnea. Reports symptoms improved and declined Paxlovid   DVT prophylaxis:.Lovenox Code Status: Full Family Communication: Plan discussed with patient and his girlfriend at bedside  disposition Plan: Home with observation Consults called:  Admission status: Observation  Level of care: Telemetry  Status is: Observation  The patient remains OBS appropriate and will d/c before 2 midnights.        DO [facility]   If 7PM-7AM, please contact night-coverage [email or web address]   07/27/2021, 8:18 PM

## 2021-07-28 ENCOUNTER — Encounter (HOSPITAL_COMMUNITY): Payer: Self-pay | Admitting: Family Medicine

## 2021-07-28 DIAGNOSIS — U071 COVID-19: Secondary | ICD-10-CM | POA: Diagnosis present

## 2021-07-28 DIAGNOSIS — Z8249 Family history of ischemic heart disease and other diseases of the circulatory system: Secondary | ICD-10-CM | POA: Diagnosis not present

## 2021-07-28 DIAGNOSIS — Z79899 Other long term (current) drug therapy: Secondary | ICD-10-CM | POA: Diagnosis not present

## 2021-07-28 DIAGNOSIS — Z833 Family history of diabetes mellitus: Secondary | ICD-10-CM | POA: Diagnosis not present

## 2021-07-28 DIAGNOSIS — R439 Unspecified disturbances of smell and taste: Secondary | ICD-10-CM | POA: Diagnosis present

## 2021-07-28 DIAGNOSIS — Z96642 Presence of left artificial hip joint: Secondary | ICD-10-CM | POA: Diagnosis present

## 2021-07-28 DIAGNOSIS — Z825 Family history of asthma and other chronic lower respiratory diseases: Secondary | ICD-10-CM | POA: Diagnosis not present

## 2021-07-28 DIAGNOSIS — D5700 Hb-SS disease with crisis, unspecified: Secondary | ICD-10-CM | POA: Diagnosis present

## 2021-07-28 DIAGNOSIS — I10 Essential (primary) hypertension: Secondary | ICD-10-CM | POA: Diagnosis present

## 2021-07-28 DIAGNOSIS — Z9081 Acquired absence of spleen: Secondary | ICD-10-CM | POA: Diagnosis not present

## 2021-07-28 DIAGNOSIS — D72829 Elevated white blood cell count, unspecified: Secondary | ICD-10-CM | POA: Diagnosis present

## 2021-07-28 DIAGNOSIS — D638 Anemia in other chronic diseases classified elsewhere: Secondary | ICD-10-CM | POA: Diagnosis present

## 2021-07-28 DIAGNOSIS — Z888 Allergy status to other drugs, medicaments and biological substances status: Secondary | ICD-10-CM | POA: Diagnosis not present

## 2021-07-28 DIAGNOSIS — J45909 Unspecified asthma, uncomplicated: Secondary | ICD-10-CM | POA: Diagnosis present

## 2021-07-28 MED ORDER — DEXTROSE-NACL 5-0.45 % IV SOLN: INTRAVENOUS | Status: DC

## 2021-07-28 MED ORDER — LIP MEDEX EX OINT
TOPICAL_OINTMENT | CUTANEOUS | Status: DC | PRN
  Filled 2021-07-28: qty 7

## 2021-07-28 MED ORDER — HYDROMORPHONE 1 MG/ML IV SOLN
INTRAVENOUS | Status: DC
  Administered 2021-07-28: 3.5 mg via INTRAVENOUS
  Administered 2021-07-28: 2.5 mg via INTRAVENOUS
  Administered 2021-07-29: 3 mg via INTRAVENOUS
  Administered 2021-07-29 (×2): 1.5 mg via INTRAVENOUS
  Administered 2021-07-29: 2 mg via INTRAVENOUS

## 2021-07-28 MED ORDER — KETOROLAC TROMETHAMINE 30 MG/ML IJ SOLN
30.0000 mg | Freq: Four times a day (QID) | INTRAMUSCULAR | Status: DC
  Administered 2021-07-28 – 2021-07-29 (×3): 30 mg via INTRAVENOUS
  Filled 2021-07-28 (×3): qty 1

## 2021-07-28 NOTE — Progress Notes (Signed)
New orders received and done. PCA adjusted per new orders. Scheduled PO Pain Medication given and IV Toradol given per new orders. Pt given reassurance and updated on new orders MD has placed. Maintain current plan of care for this Patient

## 2021-07-28 NOTE — Progress Notes (Signed)
Pt with c/o unrelieved pain with the PCA Dilaudid currently ordered. MD sent secure chat to update on Pt's status await response. Pt updated and given reassurance

## 2021-07-28 NOTE — ED Notes (Signed)
Pt placed on 2lpm [place] after O2 was dropping in to 70s with RR at 16. O2 increased to 99%

## 2021-07-28 NOTE — Progress Notes (Signed)
Subjective: A 20 year old gentleman admitted with sickle cell painful crisis as well as COVID-19 infection.  Patient is still done in the ER.  He has been ordered Dilaudid PCA Toradol and IV fluids.  Also supportive care for the COVID-19.  He is currently getting Dilaudid pushes on till he gets to a room upstairs.  Denied any nausea vomiting or diarrhea.  He is able to eat and drink without any problems.  Patient overall appears to be stable.  Objective: Vital signs in last 24 hours: Temp:  [98 F (36.7 C)] 98 F (36.7 C) (12/03 1446) Pulse Rate:  [65-119] 94 (12/04 0830) Resp:  [11-24] 18 (12/04 0830) BP: (108-170)/(66-108) 134/86 (12/04 0830) SpO2:  [74 %-100 %] 97 % (12/04 0830) Weight:  [65.1 kg] 65.1 kg (12/03 1446) Weight change:     Intake/Output from previous day: No intake/output data recorded. Intake/Output this shift: No intake/output data recorded.  General appearance: alert, cooperative, appears stated age, and no distress Neck: no adenopathy, no carotid bruit, no JVD, supple, symmetrical, trachea midline, and thyroid not enlarged, symmetric, no tenderness/mass/nodules Back: symmetric, no curvature. ROM normal. No CVA tenderness. Resp: clear to auscultation bilaterally Cardio: regular rate and rhythm, S1, S2 normal, no murmur, click, rub or gallop GI: soft, non-tender; bowel sounds normal; no masses,  no organomegaly Extremities: extremities normal, atraumatic, no cyanosis or edema Pulses: 2+ and symmetric Skin: Skin color, texture, turgor normal. No rashes or lesions Neurologic: Grossly normal  Lab Results: Recent Labs    07/27/21 1504  WBC 12.2*  HGB 11.1*  HCT 31.4*  PLT 360   BMET Recent Labs    07/27/21 1504  NA 136  K 4.3  CL 104  CO2 23  GLUCOSE 104*  BUN 10  CREATININE 0.84  CALCIUM 9.3    Studies/Results: DG Chest 2 View  Result Date: 07/27/2021 CLINICAL DATA:  Chest pain, sickle cell disease EXAM: CHEST - 2 VIEW COMPARISON:  Previous  studies including the examination of 04/11/2020 FINDINGS: Cardiac size is within normal limits. There are no signs of pulmonary edema. There are no focal pulmonary infiltrates. There is no significant effusion or pneumothorax. IMPRESSION: No active cardiopulmonary disease. Electronically Signed   By: Ernie Avena M.D.   On: 07/27/2021 17:05    Medications: I have reviewed the patient's current medications.  Assessment/Plan: A 20 year old gentleman admitted with sickle cell painful crisis as well as COVID-19 infection.  #1 sickle cell painful crisis: Patient is currently on Dilaudid PCA no Toradol and no IV fluids.  We will add Toradol and D5 half-normal at 125 cc an hour.  We will also reassess the pain as patient improves.  Continue close monitoring.  #2 COVID-19 infection: No active findings in the chest.  This morning however patient desaturated.  It appears to be related to his sickle cell crisis rather than the COVID-19 infection.  We will however continue with monitoring and oxygen.  No active treatment as this is an incidental finding.  #3 leukocytosis: Most likely due to vaso-occlusive disease.  Continue to monitor  #4 anemia of chronic disease: H&H at baseline.  Continue to monitor.   LOS: 0 days   Dontrey Snellgrove,LAWAL 07/28/2021, 8:50 AM

## 2021-07-29 DIAGNOSIS — D5700 Hb-SS disease with crisis, unspecified: Principal | ICD-10-CM

## 2021-07-29 MED ORDER — OXYCODONE HCL 5 MG PO TABS
10.0000 mg | ORAL_TABLET | ORAL | Status: DC | PRN
  Administered 2021-07-29: 10 mg via ORAL
  Filled 2021-07-29 (×2): qty 2

## 2021-07-29 MED ORDER — GUAIFENESIN-DM 100-10 MG/5ML PO SYRP
5.0000 mL | ORAL_SOLUTION | ORAL | Status: DC | PRN
  Administered 2021-07-29 – 2021-07-31 (×6): 5 mL via ORAL
  Filled 2021-07-29 (×6): qty 10

## 2021-07-29 MED ORDER — KETOROLAC TROMETHAMINE 30 MG/ML IJ SOLN
15.0000 mg | Freq: Four times a day (QID) | INTRAMUSCULAR | Status: AC
  Administered 2021-07-29 – 2021-08-02 (×17): 15 mg via INTRAVENOUS
  Filled 2021-07-29 (×17): qty 1

## 2021-07-29 MED ORDER — HYDROMORPHONE 1 MG/ML IV SOLN
INTRAVENOUS | Status: DC
  Administered 2021-07-30: 1 mg via INTRAVENOUS
  Administered 2021-07-30: 1.5 mg via INTRAVENOUS
  Administered 2021-07-30: 2 mg via INTRAVENOUS
  Administered 2021-07-30: 3 mg via INTRAVENOUS
  Administered 2021-07-31: 30 mg via INTRAVENOUS
  Administered 2021-07-31: 0.5 mg via INTRAVENOUS
  Administered 2021-07-31: 2.5 mg via INTRAVENOUS
  Administered 2021-07-31: 1 mg via INTRAVENOUS
  Administered 2021-08-01: 1.5 mg via INTRAVENOUS
  Administered 2021-08-01: 0.5 mg via INTRAVENOUS
  Administered 2021-08-01: 1 mg via INTRAVENOUS
  Administered 2021-08-01: 0.5 mg via INTRAVENOUS
  Administered 2021-08-01 – 2021-08-02 (×3): 1 mg via INTRAVENOUS
  Administered 2021-08-02: 1.5 mg via INTRAVENOUS
  Administered 2021-08-02: 2 mg via INTRAVENOUS
  Filled 2021-07-29: qty 30

## 2021-07-29 NOTE — Progress Notes (Signed)
Ambulated with patient in hall per MD order. Patient Oxygen saturation dropped to 60% without oxygen. Patient said he felt fine and we returned to the room. Placed patient back on 2 liters nasal canula. Oxygen saturation back at 92%. Patient complaining of pain in right leg 3/10.

## 2021-07-29 NOTE — Progress Notes (Signed)
Subjective: Jonathan [name] is a 20 year old male with a medical history significant for sickle cell disease was admitted for COVID-19 infection in the setting of sickle cell pain crisis. Patient states that pain intensity has improved today.  He rates pain as 4/10, patient does not feel as if he can function at home today.  He has not attempted to get out of bed.  Pain is primarily to lower extremities.  He denies headache, shortness of breath, urinary symptoms, nausea, vomiting, or diarrhea.  Objective:  Vital signs in last 24 hours:  Vitals:   07/29/21 0430 07/29/21 0810 07/29/21 1212 07/29/21 1611  BP:      Pulse:      Resp: (!) 23 18 (!) 23 20  Temp:      TempSrc:      SpO2: 99% 100% 100% 98%  Weight:      Height:        Intake/Output from previous day:   Intake/Output Summary (Last 24 hours) at 07/29/2021 1640 Last data filed at 07/29/2021 0900 Gross per 24 hour  Intake 3105.2 ml  Output 1825 ml  Net 1280.2 ml    Physical Exam: General: Alert, awake, oriented x3, in no acute distress.  HEENT: [employer]/AT PEERL, EOMI Neck: Trachea midline,  no masses, no thyromegal,y no JVD, no carotid bruit OROPHARYNX:  Moist, No exudate/ erythema/lesions.  Heart: Regular rate and rhythm, without murmurs, rubs, gallops, PMI non-displaced, no heaves or thrills on palpation.  Lungs: Clear to auscultation, no wheezing or rhonchi noted. No increased vocal fremitus resonant to percussion  Abdomen: Soft, nontender, nondistended, positive bowel sounds, no masses no hepatosplenomegaly noted..  Neuro: No focal neurological deficits noted cranial nerves II through XII grossly intact. DTRs 2+ bilaterally upper and lower extremities. Strength 5 out of 5 in bilateral upper and lower extremities. Musculoskeletal: No warm swelling or erythema around joints, no spinal tenderness noted. Psychiatric: Patient alert and oriented x3, good insight and cognition, good recent to remote recall. Lymph node survey: No  cervical axillary or inguinal lymphadenopathy noted.  Lab Results:  Basic Metabolic Panel:    Component Value Date/Time   NA 136 07/27/2021 1504   NA 141 05/11/2019 1348   K 4.3 07/27/2021 1504   CL 104 07/27/2021 1504   CO2 23 07/27/2021 1504   BUN 10 07/27/2021 1504   BUN 6 05/11/2019 1348   CREATININE 0.84 07/27/2021 1504   CREATININE 0.70 05/12/2016 1052   GLUCOSE 104 (H) 07/27/2021 1504   CALCIUM 9.3 07/27/2021 1504   CBC:    Component Value Date/Time   WBC 12.2 (H) 07/27/2021 1504   HGB 11.1 (L) 07/27/2021 1504   HGB 9.4 (L) 05/11/2019 1348   HCT 31.4 (L) 07/27/2021 1504   HCT 27.1 (L) 05/11/2019 1348   PLT 360 07/27/2021 1504   PLT 314 05/11/2019 1348   MCV 91.3 07/27/2021 1504   MCV 113 (H) 05/11/2019 1348   NEUTROABS 8.3 (H) 07/27/2021 1504   NEUTROABS 0.8 (L) 05/11/2019 1348   LYMPHSABS 2.6 07/27/2021 1504   LYMPHSABS 2.4 05/11/2019 1348   MONOABS 0.9 07/27/2021 1504   EOSABS 0.0 07/27/2021 1504   EOSABS 0.1 05/11/2019 1348   BASOSABS 0.1 07/27/2021 1504   BASOSABS 0.0 05/11/2019 1348    Recent Results (from the past 240 hour(s))  Resp Panel by RT-PCR (Flu A&B, Covid) Nasopharyngeal Swab     Status: Abnormal   Collection Time: 07/27/21  4:49 PM   Specimen: Nasopharyngeal Swab; Nasopharyngeal(NP) swabs in vial transport  medium  Result Value Ref Range Status   SARS Coronavirus 2 by RT PCR POSITIVE (A) NEGATIVE Final    Comment: RESULT CALLED TO, READ BACK BY AND VERIFIED WITH: GARRISON,G RN @1800  ON 07/27/21 JACKSON,K (NOTE) SARS-CoV-2 target nucleic acids are DETECTED.  The SARS-CoV-2 RNA is generally detectable in upper respiratory specimens during the acute phase of infection. Positive results are indicative of the presence of the identified virus, but do not rule out bacterial infection or co-infection with other pathogens not detected by the test. Clinical correlation with patient history and other diagnostic information is necessary to determine  patient infection status. The expected result is Negative.  Fact Sheet for Patients: 14/3/22  Fact Sheet for Healthcare Providers: BloggerCourse.com  This test is not yet approved or cleared by the [employer]iousBroker.[place] FDA and  has been authorized for detection and/or diagnosis of SARS-CoV-2 by FDA under an Emergency Use Authorization (EUA).  This EUA will remain in effect (meaning this test  can be used) for the duration of  the COVID-19 declaration under Section 564(b)(1) of the Act, 21 U.S.C. section 360bbb-3(b)(1), unless the authorization is terminated or revoked sooner.     Influenza A by PCR NEGATIVE NEGATIVE Final   Influenza B by PCR NEGATIVE NEGATIVE Final    Comment: (NOTE) The Xpert Xpress SARS-CoV-2/FLU/RSV plus assay is intended as an aid in the diagnosis of influenza from Nasopharyngeal swab specimens and should not be used as a sole basis for treatment. Nasal washings and aspirates are unacceptable for Xpert Xpress SARS-CoV-2/FLU/RSV testing.  Fact Sheet for Patients: [place]  Fact Sheet for Healthcare Providers: BloggerCourse.com  This test is not yet approved or cleared by the SeriousBroker.it FDA and has been authorized for detection and/or diagnosis of SARS-CoV-2 by FDA under an Emergency Use Authorization (EUA). This EUA will remain in effect (meaning this test can be used) for the duration of the COVID-19 declaration under Section 564(b)(1) of the Act, 21 U.S.C. section 360bbb-3(b)(1), unless the authorization is terminated or revoked.  Performed at [facility], [address] [place], [place]     Studies/Results: DG Chest 2 View  Result Date: 07/27/2021 CLINICAL DATA:  Chest pain, sickle cell disease EXAM: CHEST - 2 VIEW COMPARISON:  Previous studies including the examination of 04/11/2020 FINDINGS: Cardiac  size is within normal limits. There are no signs of pulmonary edema. There are no focal pulmonary infiltrates. There is no significant effusion or pneumothorax. IMPRESSION: No active cardiopulmonary disease. Electronically Signed   By: 04/13/2020 M.D.   On: 07/27/2021 17:05    Medications: Scheduled Meds:  enoxaparin (LOVENOX) injection  40 mg Subcutaneous Q24H   folic acid  1 mg Oral Daily   HYDROmorphone   Intravenous Q4H   hydroxyurea  1,500 mg Oral Daily   ketorolac  15 mg Intravenous Q6H   senna-docusate  1 tablet Oral BID   Vitamin D (Ergocalciferol)  50,000 Units Oral Weekly   Continuous Infusions:  dextrose 5 % and 0.45% NaCl 10 mL/hr at 07/29/21 1032   PRN Meds:.lip balm, naloxone **AND** sodium chloride flush, ondansetron, polyethylene glycol  Consultants: None  Procedures: None  Antibiotics: None  Assessment/Plan: Principal Problem:   Sickle cell crisis (HCC) Active Problems:   Hypertension   Chronic obstruct airways disease (HCC)   Sickle cell pain crisis (HCC)   Anemia of chronic disease   COVID-19 virus infection  Sickle cell disease with pain crisis: Continue IV Dilaudid PCA, weaning today. Oxycodone 10  mg every 4 hours as needed for breakthrough pain Toradol 15 mg IV every 6 hours Monitor vital signs very closely, reevaluate pain scale regularly, and supplemental oxygen as needed  COVID-19 infection: Patient continues to maintain oxygen saturation on 2 L.  Weaned to room air today.  Ambulating pulse ox.  Continue supportive care.  Monitor closely.  No active treatment warranted at this time.  Leukocytosis: Secondary to sickle cell crisis.  Patient afebrile.  Continue to monitor closely.  Anemia of chronic disease: Hemoglobin consistent with patient's baseline.  There is no clinical indication for blood transfusion at this time.  Continue to monitor closely.   Code Status: Full Code Family Communication: N/A Disposition Plan: Not yet ready  for discharge.  Discharge planned for 07/30/2021  [name]  APRN, MSN, FNP-C Patient Care [facility] [address] [place], [place] [address] [phone]  If 5PM-8AM, please contact night-coverage.  07/29/2021, 4:40 PM  LOS: 1 day

## 2021-07-29 NOTE — TOC Progression Note (Signed)
Pt Transition of Care [facility]) - Progression Note    Patient Details  Name: Jonathan [name] MRN: [id number] Date of Birth: [birth year]  Transition of Care [facility]) CM/SW Contact  Geni Bers, RN Phone Number: 07/29/2021, 3:30 PM  Clinical Narrative:     TOC reviewed chart. Will continue to follow for discharge needs.  Expected Discharge Plan: Home/Self Care Barriers to Discharge: No Barriers Identified  Expected Discharge Plan and Services Expected Discharge Plan: Home/Self Care       Living arrangements for the past 2 months: Single Family Home                                       Social Determinants of Health (SDOH) Interventions    Readmission Risk Interventions No flowsheet data found.

## 2021-07-30 NOTE — Progress Notes (Signed)
Patient oxygen saturation dropped to 86 in bed on room air. Placed patient back on 2 liters nasal cannula and oxygen saturation rose to 93%

## 2021-07-31 DIAGNOSIS — D5700 Hb-SS disease with crisis, unspecified: Secondary | ICD-10-CM | POA: Diagnosis not present

## 2021-07-31 MED ORDER — ACETAMINOPHEN 325 MG PO TABS
650.0000 mg | ORAL_TABLET | Freq: Once | ORAL | Status: AC
  Administered 2021-07-31: 650 mg via ORAL
  Filled 2021-07-31: qty 2

## 2021-07-31 NOTE — Progress Notes (Signed)
Subjective: Jonathan [name] is a 20 year old male with a medical history significant for sickle cell disease was admitted for COVID-19 infection in the setting of sickle cell pain crisis. Patient states that pain intensity has improved today.  He rates pain as 4/10, patient does not feel as if he can function at home today.  He has not attempted to get out of bed.  Pain is primarily to lower extremities.  He denies headache, shortness of breath, urinary symptoms, nausea, vomiting, or diarrhea.  Objective:  Vital signs in last 24 hours:  Vitals:   07/31/21 0523 07/31/21 0845 07/31/21 1154 07/31/21 1550  BP:      Pulse:      Resp: 17 18 17 18   Temp:      TempSrc:      SpO2: 99% 98% 95% 100%  Weight:      Height:        Intake/Output from previous day:   Intake/Output Summary (Last 24 hours) at 07/31/2021 1655 Last data filed at 07/31/2021 0654 Gross per 24 hour  Intake 360 ml  Output 1100 ml  Net -740 ml    Physical Exam: General: Alert, awake, oriented x3, in no acute distress.  HEENT: [place]/AT [place]ER[place], EOMI Neck: Trachea midline,  no masses, no thyromegal,y no JVD, no carotid bruit OROPHARYNX:  Moist, No exudate/ erythema/lesions.  Heart: Regular rate and rhythm, without murmurs, rubs, gallops, PMI non-displaced, no heaves or thrills on palpation.  Lungs: Clear to auscultation, no wheezing or rhonchi noted. No increased vocal fremitus resonant to percussion  Abdomen: Soft, nontender, nondistended, positive bowel sounds, no masses no hepatosplenomegaly noted..  Neuro: No focal neurological deficits noted cranial nerves II through XII grossly intact. DTRs 2+ bilaterally upper and lower extremities. Strength 5 out of 5 in bilateral upper and lower extremities. Musculoskeletal: No warm swelling or erythema around joints, no spinal tenderness noted. Psychiatric: Patient alert and oriented x3, good insight and cognition, good recent to remote recall. Lymph node survey: No cervical  axillary or inguinal lymphadenopathy noted.  Lab Results:  Basic Metabolic Panel:    Component Value Date/Time   NA 136 07/27/2021 1504   NA 141 05/11/2019 1348   K 4.3 07/27/2021 1504   CL 104 07/27/2021 1504   CO2 23 07/27/2021 1504   BUN 10 07/27/2021 1504   BUN 6 05/11/2019 1348   CREATININE 0.84 07/27/2021 1504   CREATININE 0.70 05/12/2016 1052   GLUCOSE 104 (H) 07/27/2021 1504   CALCIUM 9.3 07/27/2021 1504   CBC:    Component Value Date/Time   WBC 12.2 (H) 07/27/2021 1504   HGB 11.1 (L) 07/27/2021 1504   HGB 9.4 (L) 05/11/2019 1348   HCT 31.4 (L) 07/27/2021 1504   HCT 27.1 (L) 05/11/2019 1348   PLT 360 07/27/2021 1504   PLT 314 05/11/2019 1348   MCV 91.3 07/27/2021 1504   MCV 113 (H) 05/11/2019 1348   NEUTROABS 8.3 (H) 07/27/2021 1504   NEUTROABS 0.8 (L) 05/11/2019 1348   LYMPHSABS 2.6 07/27/2021 1504   LYMPHSABS 2.4 05/11/2019 1348   MONOABS 0.9 07/27/2021 1504   EOSABS 0.0 07/27/2021 1504   EOSABS 0.1 05/11/2019 1348   BASOSABS 0.1 07/27/2021 1504   BASOSABS 0.0 05/11/2019 1348    Recent Results (from the past 240 hour(s))  Resp Panel by RT-PCR (Flu A&B, Covid) Nasopharyngeal Swab     Status: Abnormal   Collection Time: 07/27/21  4:49 PM   Specimen: Nasopharyngeal Swab; Nasopharyngeal(NP) swabs in vial transport medium  Result Value Ref Range Status   SARS Coronavirus 2 by RT PCR POSITIVE (A) NEGATIVE Final    Comment: RESULT CALLED TO, READ BACK BY AND VERIFIED WITH: GARRISON,G RN @1800  ON 07/27/21 JACKSON,K (NOTE) SARS-CoV-2 target nucleic acids are DETECTED.  The SARS-CoV-2 RNA is generally detectable in upper respiratory specimens during the acute phase of infection. Positive results are indicative of the presence of the identified virus, but do not rule out bacterial infection or co-infection with other pathogens not detected by the test. Clinical correlation with patient history and other diagnostic information is necessary to determine  patient infection status. The expected result is Negative.  Fact Sheet for Patients: 14/3/22  Fact Sheet for Healthcare Providers: BloggerCourse.com  This test is not yet approved or cleared by the [employer]ious[employer] FDA and  has been authorized for detection and/or diagnosis of SARS-CoV-2 by FDA under an Emergency Use Authorization (EUA).  This EUA will remain in effect (meaning this test  can be used) for the duration of  the COVID-19 declaration under Section 564(b)(1) of the Act, 21 U.S.C. section 360bbb-3(b)(1), unless the authorization is terminated or revoked sooner.     Influenza A by PCR NEGATIVE NEGATIVE Final   Influenza B by PCR NEGATIVE NEGATIVE Final    Comment: (NOTE) The Xpert Xpress SARS-CoV-2/FLU/RSV plus assay is intended as an aid in the diagnosis of influenza from Nasopharyngeal swab specimens and should not be used as a sole basis for treatment. Nasal washings and aspirates are unacceptable for Xpert Xpress SARS-CoV-2/FLU/RSV testing.  Fact Sheet for Patients: [place]  Fact Sheet for Healthcare Providers: BloggerCourse.com  This test is not yet approved or cleared by the [employer]ious[employer]ker.it FDA and has been authorized for detection and/or diagnosis of SARS-CoV-2 by FDA under an Emergency Use Authorization (EUA). This EUA will remain in effect (meaning this test can be used) for the duration of the COVID-19 declaration under Section 564(b)(1) of the Act, 21 U.S.C. section 360bbb-3(b)(1), unless the authorization is terminated or revoked.  Performed at [facility], [address], [place], [place]     Studies/Results: No results found.  Medications: Scheduled Meds:  enoxaparin (LOVENOX) injection  40 mg Subcutaneous Q24H   folic acid  1 mg Oral Daily   HYDROmorphone   Intravenous Q4H   hydroxyurea  1,500 mg  Oral Daily   ketorolac  15 mg Intravenous Q6H   senna-docusate  1 tablet Oral BID   Vitamin D (Ergocalciferol)  50,000 Units Oral Weekly   Continuous Infusions:  dextrose 5 % and 0.45% NaCl 10 mL/hr at 07/29/21 1032   PRN Meds:.guaiFENesin-dextromethorphan, lip balm, naloxone **AND** sodium chloride flush, ondansetron, oxyCODONE, polyethylene glycol  Consultants: None  Procedures: None  Antibiotics: None  Assessment/Plan: Principal Problem:   Sickle cell crisis (HCC) Active Problems:   Hypertension   Chronic obstruct airways disease (HCC)   Sickle cell pain crisis (HCC)   Anemia of chronic disease   COVID-19 virus infection  Sickle cell disease with pain crisis: Continue IV Dilaudid PCA, weaning today. Oxycodone 10 mg every 4 hours as needed for breakthrough pain Toradol 15 mg IV every 6 hours Monitor vital signs very closely, reevaluate pain scale regularly, and supplemental oxygen as needed  COVID-19 infection: Patient continues to maintain oxygen saturation on 2 L.  Attempted to wean to room air, oxygen saturation around 86% with ambulation.  We will continue supplemental oxygen..  Ambulating pulse ox.  Continue supportive care.  Monitor closely.  No active  treatment warranted at this time.  Leukocytosis: Secondary to sickle cell crisis.  Patient afebrile.  Continue to monitor closely.  CBC in a.m.  Anemia of chronic disease: Hemoglobin consistent with patient's baseline.  There is no clinical indication for blood transfusion at this time.  Continue to monitor closely.   Code Status: Full Code Family Communication: N/A Disposition Plan: Not yet ready for discharge.  Discharge planned for 07/30/2021  [name]  APRN, MSN, FNP-C Patient Care [facility] [address] [place], [place] [address] [phone]  If 5PM-8AM, please contact night-coverage.  07/31/2021, 4:55 PM  LOS: 3 days

## 2021-08-01 DIAGNOSIS — D5700 Hb-SS disease with crisis, unspecified: Secondary | ICD-10-CM | POA: Diagnosis not present

## 2021-08-01 LAB — BASIC METABOLIC PANEL
Anion gap: 8 (ref 5–15)
BUN: 10 mg/dL (ref 6–20)
CO2: 29 mmol/L (ref 22–32)
Calcium: 8.9 mg/dL (ref 8.9–10.3)
Chloride: 100 mmol/L (ref 98–111)
Creatinine, Ser: 0.64 mg/dL (ref 0.61–1.24)
GFR, Estimated: >60 mL/min (ref >60)
Glucose, Bld: 103 mg/dL — ABNORMAL HIGH (ref 70–99)
Potassium: 3.6 mmol/L (ref 3.5–5.1)
Sodium: 137 mmol/L (ref 135–145)

## 2021-08-01 LAB — CBC
HCT: 22 % — ABNORMAL LOW (ref 39.0–52.0)
Hemoglobin: 7.8 g/dL — ABNORMAL LOW (ref 13.0–17.0)
MCH: 31.6 pg (ref 26.0–34.0)
MCHC: 35.5 g/dL (ref 30.0–36.0)
MCV: 89.1 fL (ref 80.0–100.0)
Platelets: 394 10*3/uL (ref 150–400)
RBC: 2.47 MIL/uL — ABNORMAL LOW (ref 4.22–5.81)
RDW: 17.7 % — ABNORMAL HIGH (ref 11.5–15.5)
WBC: 12.3 10*3/uL — ABNORMAL HIGH (ref 4.0–10.5)
nRBC: 3 % — ABNORMAL HIGH (ref 0.0–0.2)

## 2021-08-01 LAB — LACTATE DEHYDROGENASE: LDH: 382 U/L — ABNORMAL HIGH (ref 98–192)

## 2021-08-01 NOTE — Progress Notes (Cosign Needed)
Subjective: Jonathan [name] is a 20 year old male with a medical history significant for sickle cell disease was admitted for COVID-19 infection in the setting of sickle cell pain crisis.  Patient states that pain intensity is primarily to lower back.  He rates his pain as 4/10, which is unchanged.  Patient feels that he cannot function at home at this time.  He has not attempted to get out of bed.  Pain is primarily to lower extremities.  He denies headache, shortness of breath, urinary symptoms, nausea, vomiting, or diarrhea.  Objective:  Vital signs in last 24 hours:  Vitals:   08/01/21 0419 08/01/21 0525 08/01/21 0900 08/01/21 1236  BP:  117/73    Pulse:  76    Resp: 10 15 14 16   Temp:  98.5 F (36.9 C)    TempSrc:  Oral    SpO2: 99% 100% 100% 97%  Weight:      Height:        Intake/Output from previous day:   Intake/Output Summary (Last 24 hours) at 08/01/2021 1253 Last data filed at 08/01/2021 1239 Gross per 24 hour  Intake 240 ml  Output 1220 ml  Net -980 ml    Physical Exam: General: Alert, awake, oriented x3, in no acute distress.  HEENT: [place]/AT PEERL, EOMI Neck: Trachea midline,  no masses, no thyromegal,y no JVD, no carotid bruit OROPHARYNX:  Moist, No exudate/ erythema/lesions.  Heart: Regular rate and rhythm, without murmurs, rubs, gallops, PMI non-displaced, no heaves or thrills on palpation.  Lungs: Clear to auscultation, no wheezing or rhonchi noted. No increased vocal fremitus resonant to percussion  Abdomen: Soft, nontender, nondistended, positive bowel sounds, no masses no hepatosplenomegaly noted..  Neuro: No focal neurological deficits noted cranial nerves II through XII grossly intact. DTRs 2+ bilaterally upper and lower extremities. Strength 5 out of 5 in bilateral upper and lower extremities. Musculoskeletal: No warm swelling or erythema around joints, no spinal tenderness noted. Psychiatric: Patient alert and oriented x3, good insight and cognition,  good recent to remote recall. Lymph node survey: No cervical axillary or inguinal lymphadenopathy noted.  Lab Results:  Basic Metabolic Panel:    Component Value Date/Time   NA 137 08/01/2021 0416   NA 141 05/11/2019 1348   K 3.6 08/01/2021 0416   CL 100 08/01/2021 0416   CO2 29 08/01/2021 0416   BUN 10 08/01/2021 0416   BUN 6 05/11/2019 1348   CREATININE 0.64 08/01/2021 0416   CREATININE 0.70 05/12/2016 1052   GLUCOSE 103 (H) 08/01/2021 0416   CALCIUM 8.9 08/01/2021 0416   CBC:    Component Value Date/Time   WBC 12.3 (H) 08/01/2021 0416   HGB 7.8 (L) 08/01/2021 0416   HGB 9.4 (L) 05/11/2019 1348   HCT 22.0 (L) 08/01/2021 0416   HCT 27.1 (L) 05/11/2019 1348   PLT 394 08/01/2021 0416   PLT 314 05/11/2019 1348   MCV 89.1 08/01/2021 0416   MCV 113 (H) 05/11/2019 1348   NEUTROABS 8.3 (H) 07/27/2021 1504   NEUTROABS 0.8 (L) 05/11/2019 1348   LYMPHSABS 2.6 07/27/2021 1504   LYMPHSABS 2.4 05/11/2019 1348   MONOABS 0.9 07/27/2021 1504   EOSABS 0.0 07/27/2021 1504   EOSABS 0.1 05/11/2019 1348   BASOSABS 0.1 07/27/2021 1504   BASOSABS 0.0 05/11/2019 1348    Recent Results (from the past 240 hour(s))  Resp Panel by RT-PCR (Flu A&B, Covid) Nasopharyngeal Swab     Status: Abnormal   Collection Time: 07/27/21  4:49 PM  Specimen: Nasopharyngeal Swab; Nasopharyngeal(NP) swabs in vial transport medium  Result Value Ref Range Status   SARS Coronavirus 2 by RT PCR POSITIVE (A) NEGATIVE Final    Comment: RESULT CALLED TO, READ BACK BY AND VERIFIED WITH: GARRISON,G RN @1800  ON 07/27/21 JACKSON,K (NOTE) SARS-CoV-2 target nucleic acids are DETECTED.  The SARS-CoV-2 RNA is generally detectable in upper respiratory specimens during the acute phase of infection. Positive results are indicative of the presence of the identified virus, but do not rule out bacterial infection or co-infection with other pathogens not detected by the test. Clinical correlation with patient history  and other diagnostic information is necessary to determine patient infection status. The expected result is Negative.  Fact Sheet for Patients: 14/3/22  Fact Sheet for Healthcare Providers: BloggerCourse.com  This test is not yet approved or cleared by the SeriousBroker.it FDA and  has been authorized for detection and/or diagnosis of SARS-CoV-2 by FDA under an Emergency Use Authorization (EUA).  This EUA will remain in effect (meaning this test  can be used) for the duration of  the COVID-19 declaration under Section 564(b)(1) of the Act, 21 U.S.C. section 360bbb-3(b)(1), unless the authorization is terminated or revoked sooner.     Influenza A by PCR NEGATIVE NEGATIVE Final   Influenza B by PCR NEGATIVE NEGATIVE Final    Comment: (NOTE) The Xpert Xpress SARS-CoV-2/FLU/RSV plus assay is intended as an aid in the diagnosis of influenza from Nasopharyngeal swab specimens and should not be used as a sole basis for treatment. Nasal washings and aspirates are unacceptable for Xpert Xpress SARS-CoV-2/FLU/RSV testing.  Fact Sheet for Patients: [place]  Fact Sheet for Healthcare Providers: BloggerCourse.com  This test is not yet approved or cleared by the SeriousBroker.it FDA and has been authorized for detection and/or diagnosis of SARS-CoV-2 by FDA under an Emergency Use Authorization (EUA). This EUA will remain in effect (meaning this test can be used) for the duration of the COVID-19 declaration under Section 564(b)(1) of the Act, 21 U.S.C. section 360bbb-3(b)(1), unless the authorization is terminated or revoked.  Performed at [facility], [address], [place], [place]     Studies/Results: No results found.  Medications: Scheduled Meds:  enoxaparin (LOVENOX) injection  40 mg Subcutaneous Q24H   folic acid  1 mg Oral Daily    HYDROmorphone   Intravenous Q4H   hydroxyurea  1,500 mg Oral Daily   ketorolac  15 mg Intravenous Q6H   senna-docusate  1 tablet Oral BID   Vitamin D (Ergocalciferol)  50,000 Units Oral Weekly   Continuous Infusions:  dextrose 5 % and 0.45% NaCl 10 mL/hr at 07/29/21 1032   PRN Meds:.guaiFENesin-dextromethorphan, lip balm, naloxone **AND** sodium chloride flush, ondansetron, oxyCODONE, polyethylene glycol  Consultants: None  Procedures: None  Antibiotics: None  Assessment/Plan: Principal Problem:   Sickle cell crisis (HCC) Active Problems:   Hypertension   Chronic obstruct airways disease (HCC)   Sickle cell pain crisis (HCC)   Anemia of chronic disease   COVID-19 virus infection  Sickle cell disease with pain crisis: Continue IV Dilaudid PCA, weaning today. Oxycodone 10 mg every 4 hours as needed for breakthrough pain Toradol 15 mg IV every 6 hours Monitor vital signs very closely, reevaluate pain scale regularly, and supplemental oxygen as needed  COVID-19 infection: Patient continues to maintain oxygen saturation on 2 L.  Attempted to wean to room air, oxygen saturation around 86% with ambulation.  We will continue supplemental oxygen..  Ambulating pulse ox.  Continue supportive care.  Monitor closely.  No active treatment warranted at this time.  Leukocytosis: Secondary to sickle cell crisis.  Patient afebrile.  Continue to monitor closely.  CBC in a.m.  Anemia of chronic disease: Today, patient's hemoglobin is 7.8 g/dL, which is below his baseline.  Possible hemodilution.  Repeat CBC in a.m. transfuse 1 unit PRBCs if hemoglobin is less than 7 g/dL.  Follow closely.   Code Status: Full Code Family Communication: N/A Disposition Plan: Not yet ready for discharge.  Discharge planned for 07/30/2021  [name]  APRN, MSN, FNP-C Patient Care [facility] [address] [place], [place] [address] [phone]  If 5PM-8AM, please  contact night-coverage.  08/01/2021, 12:53 PM  LOS: 4 days

## 2021-08-02 LAB — TYPE AND SCREEN
ABO/RH(D): O POS
Antibody Screen: NEGATIVE

## 2021-08-02 LAB — CBC
HCT: 23.3 % — ABNORMAL LOW (ref 39.0–52.0)
Hemoglobin: 7.8 g/dL — ABNORMAL LOW (ref 13.0–17.0)
MCH: 30.7 pg (ref 26.0–34.0)
MCHC: 33.5 g/dL (ref 30.0–36.0)
MCV: 91.7 fL (ref 80.0–100.0)
Platelets: 481 10*3/uL — ABNORMAL HIGH (ref 150–400)
RBC: 2.54 MIL/uL — ABNORMAL LOW (ref 4.22–5.81)
RDW: 18.1 % — ABNORMAL HIGH (ref 11.5–15.5)
WBC: 10.3 10*3/uL (ref 4.0–10.5)
nRBC: 3.6 % — ABNORMAL HIGH (ref 0.0–0.2)

## 2021-08-02 MED ORDER — OXYCODONE HCL 10 MG PO TABS: 10.0000 mg | ORAL_TABLET | ORAL | 0 refills | Status: DC | PRN

## 2021-08-02 NOTE — Discharge Summary (Addendum)
Physician Discharge Summary  Jonathan [name] TCY:[id number] DOB: [birth year] DOA: 07/27/2021  PCP: Jonathan Angst, MD  Admit date: 07/27/2021  Discharge date: 08/02/2021  Discharge Diagnoses:  Principal Problem:   Sickle cell crisis (HCC) Active Problems:   Hypertension   Chronic obstruct airways disease (HCC)   Sickle cell pain crisis (HCC)   Anemia of chronic disease   COVID-19 virus infection   Discharge Condition: Stable  Disposition:   Follow-up Information     Jonathan Angst, MD Follow up.   Specialty: Internal Medicine Why: Tuesday, 12/13 labs Contact information: [address] [name] [address] [phone]         Jonathan Drain, MD .   Specialty: Pediatrics Contact information: [address] [place] [address] [phone]                Pt is discharged home in good condition and is to follow up with Jonathan Angst, MD this week to have labs evaluated. Jonathan [name] is instructed to increase activity slowly and balance with rest for the next few days, and use prescribed medication to complete treatment of pain  Diet: Regular Wt Readings from Last 3 Encounters:  07/27/21 65.1 kg  06/26/21 65.1 kg  10/23/20 66.6 kg    History of present illness:  Jonathan [name] is a 20 year old male with a medical history history significant for sickle cell disease, anemia of chronic disease, and asthma presents with concerns  of back and leg pain.  Patient tested positive for COVID at home about 4 days ago. Have cough, congestion, and loss of taste. Feels symptoms are improving. However, notices worsening sickle cell pain. Reports normally taking oxycodone and ibuprofen but rant out of oxycodone for the past month. He also endorses shoulder pain. No shortness of breath. Last admitted 1 month ago for sickle cell pan crisis.   ED Course:  He was febrile, hypertensive with BP of 170/90, tachycardic with heart rate of 109. WBC  12.2, hemoglobin of 1.1, platelet of 360,000. Sodium 136, K of 4.3, creatinine of 0.84, BG of 104.  COVID PCR positive. Chest x-ray negative. EKG reassuring with sinus tachycardia.    Hospital Course:  Sickle cell disease with pain crisis:  Patient was admitted for sickle cell pain crisis and managed appropriately with IVF, IV Dilaudid via PCA and IV Toradol, as well as other adjunct therapies per sickle cell pain management protocols. Prior to discharge, hemoglobin 7.8 g/dL. No blood transfusion warranted.  The patient will follow-up for labs on 08/06/2021 with PCP. Patient rates pain as 2/10. He does not have pain medication at home. Oxycodone 10 mg #30 sent to patient's pharmacy PDMP substance reporting system was reviewed prior to prescribing opiate medications, no inconsistencies noted.  COVID 19 infection:  Stable. No oxygen requirement. No further treatment warranted, continue supportive care.   Patient alert, oriented, and ambulating without assistance.  Patient afebrile with oxygen saturation of 97% on RA.  Patient was therefore discharged home today in a hemodynamically stable condition.   Jonathan [name] will follow-up with PCP within 1 week of this discharge. Jonathan [name] was counseled extensively about nonpharmacologic means of pain management, patient verbalized understanding and was appreciative of  the care received during this admission.   We discussed the need for good hydration, monitoring of hydration status, avoidance of heat, cold, stress, and infection triggers. We discussed the need to be adherent with taking Hydrea and other home medications. Patient was reminded of  the need to seek medical attention immediately if any symptom of bleeding, anemia, or infection occurs.  Discharge Exam: Vitals:   08/02/21 0948 08/02/21 1118  BP: 124/78   Pulse: 82   Resp: 20 19  Temp: 98.3 F (36.8 C)   SpO2: 100% 94%   Vitals:   08/02/21 0615 08/02/21 0758 08/02/21 0948 08/02/21 1118  BP:  130/69  124/78   Pulse: 74  82   Resp: 17 13 20 19   Temp: 98.3 F (36.8 C)  98.3 F (36.8 C)   TempSrc: Oral  Oral   SpO2: 100% 98% 100% 94%  Weight:      Height:        General appearance : Awake, alert, not in any distress. Speech Clear. Not toxic looking HEENT: Atraumatic and Normocephalic, pupils equally reactive to light and accomodation Neck: Supple, no JVD. No cervical lymphadenopathy.  Chest: Good air entry bilaterally, no added sounds  CVS: S1 S2 regular, no murmurs.  Abdomen: Bowel sounds present, Non tender and not distended with no gaurding, rigidity or rebound. Extremities: B/L Lower Ext shows no edema, both legs are warm to touch Neurology: Awake alert, and oriented X 3, CN II-XII intact, Non focal Skin: No Rash  Discharge Instructions  Discharge Instructions     Discharge patient   Complete by: As directed    Discharge disposition: 01-Home or Self Care   Discharge patient date: 08/02/2021      Allergies as of 08/02/2021       Reactions   Allergy Medication [diphenhydramine]         Medication List     TAKE these medications    acetaminophen 500 MG tablet Commonly known as: TYLENOL Take 500 mg by mouth every 6 (six) hours as needed for moderate pain.   COLD AND FLU PO Take 2 capsules by mouth every 12 (twelve) hours as needed (cold symptoms).   ergocalciferol 1.25 MG (50000 UT) capsule Commonly known as: Drisdol Take 1 capsule (50,000 Units total) by mouth once a week. What changed: additional instructions   folic acid 1 MG tablet Commonly known as: FOLVITE TAKE 1 TABLET(1 MG) BY MOUTH DAILY What changed: See the new instructions.   hydroxyurea 500 MG capsule Commonly known as: HYDREA Take 3 capsules (1,500 mg total) by mouth daily.   ibuprofen 200 MG tablet Commonly known as: ADVIL Take 200 mg by mouth every 6 (six) hours as needed for moderate pain.   ibuprofen 800 MG tablet Commonly known as: ADVIL Take 1 tablet (800 mg total) by  mouth every 6 (six) hours as needed for moderate pain.   Oxycodone HCl 10 MG Tabs Take 1 tablet (10 mg total) by mouth every 4 (four) hours as needed (pain).        The results of significant diagnostics from this hospitalization (including imaging, microbiology, ancillary and laboratory) are listed below for reference.    Significant Diagnostic Studies: DG Chest 2 View  Result Date: 07/27/2021 CLINICAL DATA:  Chest pain, sickle cell disease EXAM: CHEST - 2 VIEW COMPARISON:  Previous studies including the examination of 04/11/2020 FINDINGS: Cardiac size is within normal limits. There are no signs of pulmonary edema. There are no focal pulmonary infiltrates. There is no significant effusion or pneumothorax. IMPRESSION: No active cardiopulmonary disease. Electronically Signed   By: 04/13/2020 M.D.   On: 07/27/2021 17:05    Microbiology: Recent Results (from the past 240 hour(s))  Resp Panel by RT-PCR (Flu A&B, Covid) Nasopharyngeal Swab  Status: Abnormal   Collection Time: 07/27/21  4:49 PM   Specimen: Nasopharyngeal Swab; Nasopharyngeal(NP) swabs in vial transport medium  Result Value Ref Range Status   SARS Coronavirus 2 by RT PCR POSITIVE (A) NEGATIVE Final    Comment: RESULT CALLED TO, READ BACK BY AND VERIFIED WITH: GARRISON,G RN @1800  ON 07/27/21 JACKSON,K (NOTE) SARS-CoV-2 target nucleic acids are DETECTED.  The SARS-CoV-2 RNA is generally detectable in upper respiratory specimens during the acute phase of infection. Positive results are indicative of the presence of the identified virus, but do not rule out bacterial infection or co-infection with other pathogens not detected by the test. Clinical correlation with patient history and other diagnostic information is necessary to determine patient infection status. The expected result is Negative.  Fact Sheet for Patients: 14/3/22  Fact Sheet for Healthcare  Providers: BloggerCourse.com  This test is not yet approved or cleared by the SeriousBroker.it FDA and  has been authorized for detection and/or diagnosis of SARS-CoV-2 by FDA under an Emergency Use Authorization (EUA).  This EUA will remain in effect (meaning this test  can be used) for the duration of  the COVID-19 declaration under Section 564(b)(1) of the Act, 21 U.S.C. section 360bbb-3(b)(1), unless the authorization is terminated or revoked sooner.     Influenza A by PCR NEGATIVE NEGATIVE Final   Influenza B by PCR NEGATIVE NEGATIVE Final    Comment: (NOTE) The Xpert Xpress SARS-CoV-2/FLU/RSV plus assay is intended as an aid in the diagnosis of influenza from Nasopharyngeal swab specimens and should not be used as a sole basis for treatment. Nasal washings and aspirates are unacceptable for Xpert Xpress SARS-CoV-2/FLU/RSV testing.  Fact Sheet for Patients: [place]  Fact Sheet for Healthcare Providers: BloggerCourse.com  This test is not yet approved or cleared by the SeriousBroker.it FDA and has been authorized for detection and/or diagnosis of SARS-CoV-2 by FDA under an Emergency Use Authorization (EUA). This EUA will remain in effect (meaning this test can be used) for the duration of the COVID-19 declaration under Section 564(b)(1) of the Act, 21 U.S.C. section 360bbb-3(b)(1), unless the authorization is terminated or revoked.  Performed at [facility], [address] [place], [place]      Labs: Basic Metabolic Panel: Recent Labs  Lab 07/27/21 1504 08/01/21 0416  NA 136 137  K 4.3 3.6  CL 104 100  CO2 23 29  GLUCOSE 104* 103*  BUN 10 10  CREATININE 0.84 0.64  CALCIUM 9.3 8.9   Liver Function Tests: Recent Labs  Lab 07/27/21 1504  AST 39  ALT 30  ALKPHOS 93  BILITOT 1.3*  PROT 8.5*  ALBUMIN 4.8   No results for input(s): LIPASE, AMYLASE  in the last 168 hours. No results for input(s): AMMONIA in the last 168 hours. CBC: Recent Labs  Lab 07/27/21 1504 08/01/21 0416 08/02/21 0441  WBC 12.2* 12.3* 10.3  NEUTROABS 8.3*  --   --   HGB 11.1* 7.8* 7.8*  HCT 31.4* 22.0* 23.3*  MCV 91.3 89.1 91.7  PLT 360 394 481*   Cardiac Enzymes: No results for input(s): CKTOTAL, CKMB, CKMBINDEX, TROPONINI in the last 168 hours. BNP: Invalid input(s): POCBNP CBG: No results for input(s): GLUCAP in the last 168 hours.  Time coordinating discharge: 30 minutes  Signed:  14/09/22  APRN, MSN, FNP-C Patient Care [facility] [address] [place], [place] [phone]  [facility] 08/02/2021, 12:03 PM

## 2021-08-02 NOTE — Plan of Care (Signed)

## 2021-08-06 ENCOUNTER — Other Ambulatory Visit: Payer: Commercial Managed Care - PPO

## 2021-08-06 ENCOUNTER — Other Ambulatory Visit: Payer: Self-pay

## 2021-08-06 ENCOUNTER — Other Ambulatory Visit: Payer: Self-pay | Admitting: Family Medicine

## 2021-08-06 DIAGNOSIS — E559 Vitamin D deficiency, unspecified: Secondary | ICD-10-CM

## 2021-08-06 DIAGNOSIS — D571 Sickle-cell disease without crisis: Secondary | ICD-10-CM

## 2021-08-06 NOTE — Progress Notes (Signed)
Orders Placed This Encounter  Procedures   Sickle Cell Panel    Standing Status:   Future    Standing Expiration Date:   08/06/2022   [name]  APRN, MSN, FNP-C Patient Care [facility] [address] [place] [address] [place], [place] [address] [phone]

## 2021-08-07 LAB — CMP14+CBC/D/PLT+FER+RETIC+V...
ALT: 9 IU/L (ref 0–44)
AST: 15 IU/L (ref 0–40)
Albumin/Globulin Ratio: 1.3 (ref 1.2–2.2)
Albumin: 4 g/dL — ABNORMAL LOW (ref 4.1–5.2)
Alkaline Phosphatase: 126 IU/L — ABNORMAL HIGH (ref 44–121)
BUN/Creatinine Ratio: 11 (ref 9–20)
BUN: 9 mg/dL (ref 6–20)
Basophils Absolute: 0.1 10*3/uL (ref 0.0–0.2)
Basos: 1 %
Bilirubin Total: 0.4 mg/dL (ref 0.0–1.2)
CO2: 22 mmol/L (ref 20–29)
Calcium: 9.5 mg/dL (ref 8.7–10.2)
Chloride: 103 mmol/L (ref 96–106)
Creatinine, Ser: 0.83 mg/dL (ref 0.76–1.27)
EOS (ABSOLUTE): 0.2 10*3/uL (ref 0.0–0.4)
Eos: 3 %
Ferritin: 1013 ng/mL — ABNORMAL HIGH (ref 30–400)
Globulin, Total: 3 g/dL (ref 1.5–4.5)
Glucose: 100 mg/dL — ABNORMAL HIGH (ref 70–99)
Hematocrit: 23.6 % — ABNORMAL LOW (ref 37.5–51.0)
Hemoglobin: 7.6 g/dL — ABNORMAL LOW (ref 13.0–17.7)
Immature Grans (Abs): 0 10*3/uL (ref 0.0–0.1)
Immature Granulocytes: 1 %
Lymphocytes Absolute: 2.4 10*3/uL (ref 0.7–3.1)
Lymphs: 36 %
MCH: 30.9 pg (ref 26.6–33.0)
MCHC: 32.2 g/dL (ref 31.5–35.7)
MCV: 96 fL (ref 79–97)
Monocytes Absolute: 0.6 10*3/uL (ref 0.1–0.9)
Monocytes: 9 %
NRBC: 5 % — ABNORMAL HIGH (ref 0–0)
Neutrophils Absolute: 3.4 10*3/uL (ref 1.4–7.0)
Neutrophils: 50 %
Platelets: 958 10*3/uL (ref 150–450)
Potassium: 5.2 mmol/L (ref 3.5–5.2)
RBC: 2.46 x10E6/uL — CL (ref 4.14–5.80)
RDW: 19.5 % — ABNORMAL HIGH (ref 11.6–15.4)
Retic Ct Pct: 6 % — ABNORMAL HIGH (ref 0.6–2.6)
Sodium: 139 mmol/L (ref 134–144)
Total Protein: 7 g/dL (ref 6.0–8.5)
Vit D, 25-Hydroxy: 34.7 ng/mL (ref 30.0–100.0)
WBC: 6.7 10*3/uL (ref 3.4–10.8)
eGFR: 127 mL/min/{1.73_m2} (ref >59)

## 2021-08-23 ENCOUNTER — Telehealth: Payer: Self-pay

## 2021-08-23 NOTE — Telephone Encounter (Signed)
Transition Care Management Unsuccessful Follow-up Telephone Call  Date of discharge and from where:  08/02/21 from [facility]  Attempts:  1st Attempt  Reason for unsuccessful TCM follow-up call:  Left voice message  Kathyrn Sheriff, RN, MSN, BSN, CCM [facility] Care Management Coordinator [phone]

## 2021-08-29 ENCOUNTER — Telehealth: Payer: Self-pay

## 2021-08-29 NOTE — Telephone Encounter (Signed)
Transition Care Management Unsuccessful Follow-up Telephone Call  Date of discharge and from where:  08/02/2021  [facility]  Attempts:  2nd Attempt  Reason for unsuccessful TCM follow-up call:  No answer/busy  Tomasa Rand, RN, BSN, CEN [place] Coordinator [phone]

## 2021-09-01 ENCOUNTER — Encounter (HOSPITAL_COMMUNITY): Payer: Self-pay

## 2021-09-01 ENCOUNTER — Emergency Department (HOSPITAL_COMMUNITY)
Admission: EM | Admit: 2021-09-01 | Discharge: 2021-09-01 | Disposition: A | Discharge status: Home / Self Care | Payer: Commercial Managed Care - PPO | Source: Home / Self Care | Attending: Student | Admitting: Student

## 2021-09-01 DIAGNOSIS — D5700 Hb-SS disease with crisis, unspecified: Secondary | ICD-10-CM | POA: Insufficient documentation

## 2021-09-01 LAB — CBC WITH DIFFERENTIAL/PLATELET
Abs Immature Granulocytes: 0.45 10*3/uL — ABNORMAL HIGH (ref 0.00–0.07)
Basophils Absolute: 0.1 10*3/uL (ref 0.0–0.1)
Basophils Relative: 1 %
Eosinophils Absolute: 0.1 10*3/uL (ref 0.0–0.5)
Eosinophils Relative: 1 %
HCT: 29.5 % — ABNORMAL LOW (ref 39.0–52.0)
Hemoglobin: 10.1 g/dL — ABNORMAL LOW (ref 13.0–17.0)
Immature Granulocytes: 4 %
Lymphocytes Relative: 21 %
Lymphs Abs: 2.3 10*3/uL (ref 0.7–4.0)
MCH: 32.7 pg (ref 26.0–34.0)
MCHC: 34.2 g/dL (ref 30.0–36.0)
MCV: 95.5 fL (ref 80.0–100.0)
Monocytes Absolute: 0.6 10*3/uL (ref 0.1–1.0)
Monocytes Relative: 6 %
Neutro Abs: 7.3 10*3/uL (ref 1.7–7.7)
Neutrophils Relative %: 67 %
Platelets: 339 10*3/uL (ref 150–400)
RBC: 3.09 MIL/uL — ABNORMAL LOW (ref 4.22–5.81)
RDW: 20 % — ABNORMAL HIGH (ref 11.5–15.5)
WBC: 10.8 10*3/uL — ABNORMAL HIGH (ref 4.0–10.5)
nRBC: 2.5 % — ABNORMAL HIGH (ref 0.0–0.2)

## 2021-09-01 LAB — RETICULOCYTES
Immature Retic Fract: 43 % — ABNORMAL HIGH (ref 2.3–15.9)
RBC.: 3.12 MIL/uL — ABNORMAL LOW (ref 4.22–5.81)
Retic Count, Absolute: 214.7 10*3/uL — ABNORMAL HIGH (ref 19.0–186.0)
Retic Ct Pct: 6.9 % — ABNORMAL HIGH (ref 0.4–3.1)

## 2021-09-01 LAB — COMPREHENSIVE METABOLIC PANEL
ALT: 12 U/L (ref 0–44)
AST: 27 U/L (ref 15–41)
Albumin: 4.2 g/dL (ref 3.5–5.0)
Alkaline Phosphatase: 144 U/L — ABNORMAL HIGH (ref 38–126)
Anion gap: 7 (ref 5–15)
BUN: 9 mg/dL (ref 6–20)
CO2: 23 mmol/L (ref 22–32)
Calcium: 9 mg/dL (ref 8.9–10.3)
Chloride: 107 mmol/L (ref 98–111)
Creatinine, Ser: 0.78 mg/dL (ref 0.61–1.24)
GFR, Estimated: >60 mL/min (ref >60)
Glucose, Bld: 116 mg/dL — ABNORMAL HIGH (ref 70–99)
Potassium: 4.3 mmol/L (ref 3.5–5.1)
Sodium: 137 mmol/L (ref 135–145)
Total Bilirubin: 1.1 mg/dL (ref 0.3–1.2)
Total Protein: 7.5 g/dL (ref 6.5–8.1)

## 2021-09-01 MED ORDER — KETOROLAC TROMETHAMINE 15 MG/ML IJ SOLN
15.0000 mg | INTRAMUSCULAR | Status: AC
  Administered 2021-09-01: 15 mg via INTRAVENOUS
  Filled 2021-09-01: qty 1

## 2021-09-01 MED ORDER — SODIUM CHLORIDE 0.45 % IV SOLN: INTRAVENOUS | Status: DC

## 2021-09-01 MED ORDER — ONDANSETRON HCL 4 MG/2ML IJ SOLN
4.0000 mg | Freq: Once | INTRAMUSCULAR | Status: DC
  Filled 2021-09-01: qty 2

## 2021-09-01 MED ORDER — HYDROMORPHONE HCL 2 MG/ML IJ SOLN
2.0000 mg | INTRAMUSCULAR | Status: AC
  Administered 2021-09-01: 2 mg via INTRAVENOUS
  Filled 2021-09-01: qty 1

## 2021-09-01 MED ORDER — OXYCODONE HCL ER 10 MG PO T12A
10.0000 mg | EXTENDED_RELEASE_TABLET | Freq: Two times a day (BID) | ORAL | Status: DC
  Administered 2021-09-01: 10 mg via ORAL
  Filled 2021-09-01: qty 1

## 2021-09-01 MED ORDER — ONDANSETRON 4 MG PO TBDP
4.0000 mg | ORAL_TABLET | Freq: Once | ORAL | Status: AC
  Administered 2021-09-01: 4 mg via ORAL
  Filled 2021-09-01: qty 1

## 2021-09-01 NOTE — ED Triage Notes (Signed)
Pt c/o sickle cell crisis pain in bilateral shoulders and legs, uncontrolled by PO home regimen.

## 2021-09-01 NOTE — Discharge Instructions (Signed)
Take your medications as prescribed. Call your primary care doctor for medication refill. You were given a dose of long-acting pain medication before leaving to help tonight before your med refill.  Return to the sickle cell clinic or the ER as needed if pain is uncontrollable.  Return to the ER with any new, worsening, or concerning symptoms.

## 2021-09-01 NOTE — ED Notes (Signed)
I provided reinforced discharge education based off of discharge instructions. Pt acknowledged and understood my education. Pt had no further questions/concerns for provider/myself.  °

## 2021-09-01 NOTE — ED Provider Notes (Signed)
[name]EM[facility]Y [facility]PT Provider Note   CSN: [id number] Arrival date & time: 09/01/21  1140     History  Chief Complaint  Patient presents with   Sickle Cell Pain Crisis    Jonathan [name] is a 21 y.o. male presenting for evaluation of bilateral shoulder and left knee pain.  Patient states he has had pain in shoulders and knees since about 11 pm last night.  He states his pain is consistent with his normal sickle cell pain.  Does not feel different in any way.  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  He has been taking oxycodone and ibuprofen for pain without improvement.  He thinks he is having pain due to weather change.  He denies fevers, chills, cough, chest pain, shortness of breath, nausea, vomiting.  HPI     Home Medications Prior to Admission medications   Medication Sig Start Date End Date Taking? Authorizing Provider  acetaminophen (TYLENOL) 500 MG tablet Take 500 mg by mouth every 6 (six) hours as needed for moderate pain.    [provider]  ergocalciferol (DRISDOL) 1.25 MG (50000 UT) capsule Take 1 capsule (50,000 Units total) by mouth once a week. Patient taking differently: Take 50,000 Units by mouth once a week. Sunday's 04/12/20   Barbette Merino, NP  folic acid (FOLVITE) 1 MG tablet TAKE 1 TABLET(1 MG) BY MOUTH DAILY Patient taking differently: Take 1 mg by mouth daily. 08/14/20   Barbette Merino, NP  hydroxyurea (HYDREA) 500 MG capsule Take 3 capsules (1,500 mg total) by mouth daily. 12/20/20   Massie Maroon, FNP  ibuprofen (ADVIL) 200 MG tablet Take 200 mg by mouth every 6 (six) hours as needed for moderate pain.    [provider]  ibuprofen (ADVIL) 800 MG tablet Take 1 tablet (800 mg total) by mouth every 6 (six) hours as needed for moderate pain. Patient not taking: Reported on 07/27/2021 06/28/21   Massie Maroon, FNP  Nutritional Supplements  (COLD AND FLU PO) Take 2 capsules by mouth every 12 (twelve) hours as needed (cold symptoms).    [provider]  Oxycodone HCl 10 MG TABS Take 1 tablet (10 mg total) by mouth every 4 (four) hours as needed (pain). 08/02/21   Massie Maroon, FNP      Allergies    Allergy medication [diphenhydramine]    Review of Systems   Review of Systems  Musculoskeletal:  Positive for arthralgias and myalgias.  All other systems reviewed and are negative.  Physical Exam Updated Vital Signs BP (!) 150/104 (BP Location: Left Arm)    Pulse 98    Temp 98.4 F (36.9 C) (Oral)    Resp 18    Ht 5\' 5"  (1.651 m)    Wt 71.7 kg    SpO2 100%    BMI 26.29 kg/m  Physical Exam Vitals and nursing note reviewed.  Constitutional:      General: He is not in acute distress.    Appearance: Normal appearance.     Comments: Resting in the bed in no acute distress  HENT:     Head: Normocephalic and atraumatic.  Eyes:     Conjunctiva/sclera: Conjunctivae normal.     Pupils: Pupils are equal, round, and reactive to light.  Cardiovascular:     Rate and Rhythm: Normal rate and regular rhythm.     Pulses: Normal pulses.  Pulmonary:     Effort: Pulmonary effort is normal. No respiratory distress.     Breath sounds: Normal breath sounds. No wheezing.     Comments: Speaking in full sentences.  Clear lung sounds in all fields. Abdominal:     General: There is no distension.     Palpations: Abdomen is soft.     Tenderness: There is no abdominal tenderness.  Musculoskeletal:        General: Tenderness present. Normal range of motion.     Cervical back: Normal range of motion and neck supple.     Comments: Diffuse tenderness palpation bilateral shoulders and left knee and calf.  No erythema or warmth.  No unilateral swelling.  Pedal pulses 2+.  Skin:    General: Skin is warm and dry.     Capillary Refill: Capillary refill takes less than 2 seconds.  Neurological:     Mental Status: He is alert and oriented  to person, place, and time.  Psychiatric:        Mood and Affect: Mood and affect normal.        Speech: Speech normal.        Behavior: Behavior normal.    ED Results / Procedures / Treatments   Labs (all labs ordered are listed, but only abnormal results are displayed) Labs Reviewed  COMPREHENSIVE METABOLIC PANEL - Abnormal; Notable for the following components:      Result Value   Glucose, Bld 116 (*)    Alkaline Phosphatase 144 (*)    All other components within normal limits  CBC WITH DIFFERENTIAL/PLATELET - Abnormal; Notable for the following components:   WBC 10.8 (*)    RBC 3.09 (*)    Hemoglobin 10.1 (*)    HCT 29.5 (*)    RDW 20.0 (*)    nRBC 2.5 (*)    Abs Immature Granulocytes 0.45 (*)    All other components within normal limits  RETICULOCYTES - Abnormal; Notable for the following components:   Retic Ct Pct 6.9 (*)    RBC. 3.12 (*)    Retic Count, Absolute 214.7 (*)    Immature Retic Fract 43.0 (*)    All other components within normal limits    EKG None  Radiology No results found.  Procedures Procedures    Medications Ordered in ED Medications  0.45 % sodium chloride infusion (0 mLs Intravenous Stopped 09/01/21 1506)  oxyCODONE (OXYCONTIN) 12 hr tablet 10 mg (10 mg Oral Given 09/01/21 1505)  ondansetron (ZOFRAN) injection 4 mg (4 mg Intravenous Not Given 09/01/21 1516)  ketorolac (TORADOL) 15 MG/ML injection 15 mg (15 mg Intravenous Given 09/01/21 1231)  HYDROmorphone (DILAUDID) injection 2 mg (2 mg Intravenous Given 09/01/21 1231)  HYDROmorphone (DILAUDID) injection 2 mg (2 mg Intravenous Given 09/01/21 1314)  ondansetron (ZOFRAN-ODT) disintegrating tablet 4 mg (4 mg Oral Given 09/01/21 1516)    ED Course/ Medical Decision Making/ A&P                           Medical Decision Making   This patient presents to the ED for concern of sickle cell pain. This involves a number of treatment options, and is a complaint that carries with it a moderate risk of  complications and morbidity.  The differential diagnosis includes sickle cell pain crisis, MSK pain, DVT   Co morbidities:  SCC   Lab Tests:  I ordered, and personally interpreted labs.  The pertinent results include: Mild  leukocytosis of 10.8, likely reactive.  Hemoglobin at baseline.  Electrolytes stable.   Cardiac Monitoring:  The patient was maintained on a cardiac monitor.  I personally viewed and interpreted the cardiac monitored which showed an underlying rhythm of: nsr   Medicines ordered:  I ordered medication including pain medications  for pain control Reevaluation of the patient after these medicines showed that the patient improved I have reviewed the patients home medicines and have made adjustments as needed   Test Considered:  Patient states he is not feels consistent with his previous DVT, he does not have focal calf pain, no swelling or erythema.  Doubt DVT.  Will hold off on ultrasound   Dispostion:  After consideration of the diagnostic results and the patients response to treatment, I feel that the patent would benefit from symptomatic management at home and follow-up with PCP outpatient.  Patient states he is out of his pain medication, and I instructed him we will need to follow-up with his primary care doctor regarding this. Will give one ER dose of oxycodone. Gave him the number to call.  At this time, patient appears safe for discharge.  Return precautions given.  Patient states he understands and agrees to plan  Final Clinical Impression(s) / ED Diagnoses Final diagnoses:  Sickle cell pain crisis [name])    Rx / DC Orders ED Discharge Orders     None         Alveria Apley, PA-C 09/01/21 1519    Kommor, Wyn Forster, MD 09/01/21 [phone]

## 2021-09-02 ENCOUNTER — Encounter (HOSPITAL_COMMUNITY): Payer: Self-pay

## 2021-09-02 ENCOUNTER — Inpatient Hospital Stay (HOSPITAL_COMMUNITY)
Admission: EM | Admit: 2021-09-02 | Discharge: 2021-09-06 | DRG: 812 | Disposition: A | Discharge status: Home / Self Care | Payer: Commercial Managed Care - PPO | Attending: Internal Medicine | Admitting: Internal Medicine

## 2021-09-02 ENCOUNTER — Other Ambulatory Visit: Payer: Self-pay

## 2021-09-02 DIAGNOSIS — Z9081 Acquired absence of spleen: Secondary | ICD-10-CM

## 2021-09-02 DIAGNOSIS — J4520 Mild intermittent asthma, uncomplicated: Secondary | ICD-10-CM | POA: Diagnosis present

## 2021-09-02 DIAGNOSIS — M8788 Other osteonecrosis, other site: Secondary | ICD-10-CM | POA: Diagnosis present

## 2021-09-02 DIAGNOSIS — M87052 Idiopathic aseptic necrosis of left femur: Secondary | ICD-10-CM | POA: Diagnosis present

## 2021-09-02 DIAGNOSIS — Z96642 Presence of left artificial hip joint: Secondary | ICD-10-CM | POA: Diagnosis present

## 2021-09-02 DIAGNOSIS — G894 Chronic pain syndrome: Secondary | ICD-10-CM | POA: Diagnosis present

## 2021-09-02 DIAGNOSIS — D72829 Elevated white blood cell count, unspecified: Secondary | ICD-10-CM | POA: Diagnosis present

## 2021-09-02 DIAGNOSIS — I10 Essential (primary) hypertension: Secondary | ICD-10-CM | POA: Diagnosis present

## 2021-09-02 DIAGNOSIS — Z833 Family history of diabetes mellitus: Secondary | ICD-10-CM | POA: Diagnosis not present

## 2021-09-02 DIAGNOSIS — D638 Anemia in other chronic diseases classified elsewhere: Secondary | ICD-10-CM | POA: Diagnosis present

## 2021-09-02 DIAGNOSIS — D5700 Hb-SS disease with crisis, unspecified: Secondary | ICD-10-CM | POA: Diagnosis present

## 2021-09-02 DIAGNOSIS — Z888 Allergy status to other drugs, medicaments and biological substances status: Secondary | ICD-10-CM

## 2021-09-02 DIAGNOSIS — Z79899 Other long term (current) drug therapy: Secondary | ICD-10-CM | POA: Diagnosis not present

## 2021-09-02 DIAGNOSIS — Z20822 Contact with and (suspected) exposure to covid-19: Secondary | ICD-10-CM | POA: Diagnosis present

## 2021-09-02 DIAGNOSIS — Z825 Family history of asthma and other chronic lower respiratory diseases: Secondary | ICD-10-CM

## 2021-09-02 DIAGNOSIS — Z8249 Family history of ischemic heart disease and other diseases of the circulatory system: Secondary | ICD-10-CM | POA: Diagnosis not present

## 2021-09-02 LAB — CBC WITH DIFFERENTIAL/PLATELET
Abs Immature Granulocytes: 0.1 10*3/uL — ABNORMAL HIGH (ref 0.00–0.07)
Basophils Absolute: 0.1 10*3/uL (ref 0.0–0.1)
Basophils Relative: 1 %
Eosinophils Absolute: 0.2 10*3/uL (ref 0.0–0.5)
Eosinophils Relative: 2 %
HCT: 30.1 % — ABNORMAL LOW (ref 39.0–52.0)
Hemoglobin: 10.2 g/dL — ABNORMAL LOW (ref 13.0–17.0)
Immature Granulocytes: 1 %
Lymphocytes Relative: 16 %
Lymphs Abs: 1.8 10*3/uL (ref 0.7–4.0)
MCH: 32.4 pg (ref 26.0–34.0)
MCHC: 33.9 g/dL (ref 30.0–36.0)
MCV: 95.6 fL (ref 80.0–100.0)
Monocytes Absolute: 0.8 10*3/uL (ref 0.1–1.0)
Monocytes Relative: 7 %
Neutro Abs: 8.6 10*3/uL — ABNORMAL HIGH (ref 1.7–7.7)
Neutrophils Relative %: 73 %
Platelets: 395 10*3/uL (ref 150–400)
RBC: 3.15 MIL/uL — ABNORMAL LOW (ref 4.22–5.81)
RDW: 20.7 % — ABNORMAL HIGH (ref 11.5–15.5)
WBC: 11.6 10*3/uL — ABNORMAL HIGH (ref 4.0–10.5)
nRBC: 4.3 % — ABNORMAL HIGH (ref 0.0–0.2)

## 2021-09-02 LAB — BASIC METABOLIC PANEL
Anion gap: 9 (ref 5–15)
BUN: 9 mg/dL (ref 6–20)
CO2: 25 mmol/L (ref 22–32)
Calcium: 9.3 mg/dL (ref 8.9–10.3)
Chloride: 104 mmol/L (ref 98–111)
Creatinine, Ser: 0.75 mg/dL (ref 0.61–1.24)
GFR, Estimated: >60 mL/min (ref >60)
Glucose, Bld: 100 mg/dL — ABNORMAL HIGH (ref 70–99)
Potassium: 3.9 mmol/L (ref 3.5–5.1)
Sodium: 138 mmol/L (ref 135–145)

## 2021-09-02 LAB — LACTATE DEHYDROGENASE: LDH: 325 U/L — ABNORMAL HIGH (ref 98–192)

## 2021-09-02 LAB — RETICULOCYTES
Immature Retic Fract: 39.7 % — ABNORMAL HIGH (ref 2.3–15.9)
RBC.: 3.18 MIL/uL — ABNORMAL LOW (ref 4.22–5.81)
Retic Count, Absolute: 237.9 10*3/uL — ABNORMAL HIGH (ref 19.0–186.0)
Retic Ct Pct: 7.5 % — ABNORMAL HIGH (ref 0.4–3.1)

## 2021-09-02 MED ORDER — OXYCODONE HCL 5 MG PO TABS
10.0000 mg | ORAL_TABLET | Freq: Once | ORAL | Status: AC
Start: 2021-09-02 — End: 2021-09-02
  Administered 2021-09-02: 10 mg via ORAL
  Filled 2021-09-02: qty 2

## 2021-09-02 MED ORDER — HYDROMORPHONE HCL 1 MG/ML IJ SOLN
1.0000 mg | Freq: Once | INTRAMUSCULAR | Status: AC
  Administered 2021-09-02: 1 mg via INTRAVENOUS
  Filled 2021-09-02: qty 1

## 2021-09-02 MED ORDER — ONDANSETRON HCL 4 MG/2ML IJ SOLN
4.0000 mg | Freq: Once | INTRAMUSCULAR | Status: AC
  Administered 2021-09-02: 4 mg via INTRAVENOUS
  Filled 2021-09-02: qty 2

## 2021-09-02 MED ORDER — SODIUM CHLORIDE 0.45 % IV SOLN: INTRAVENOUS | Status: DC

## 2021-09-02 MED ORDER — POLYETHYLENE GLYCOL 3350 17 G PO PACK: 17.0000 g | PACK | Freq: Every day | ORAL | Status: DC | PRN

## 2021-09-02 MED ORDER — ONDANSETRON HCL 4 MG/2ML IJ SOLN
4.0000 mg | Freq: Four times a day (QID) | INTRAMUSCULAR | Status: DC | PRN
  Administered 2021-09-02 – 2021-09-05 (×3): 4 mg via INTRAVENOUS
  Filled 2021-09-02 (×3): qty 2

## 2021-09-02 MED ORDER — KETOROLAC TROMETHAMINE 30 MG/ML IJ SOLN
30.0000 mg | Freq: Once | INTRAMUSCULAR | Status: AC
  Administered 2021-09-02: 30 mg via INTRAVENOUS
  Filled 2021-09-02: qty 1

## 2021-09-02 MED ORDER — SODIUM CHLORIDE 0.9 % IV BOLUS
1000.0000 mL | Freq: Once | INTRAVENOUS | Status: AC
  Administered 2021-09-02: 1000 mL via INTRAVENOUS

## 2021-09-02 MED ORDER — SODIUM CHLORIDE 0.9 % IV SOLN
25.0000 mg | INTRAVENOUS | Status: DC | PRN
  Filled 2021-09-02: qty 0.5

## 2021-09-02 MED ORDER — DIPHENHYDRAMINE HCL 25 MG PO CAPS: 25.0000 mg | ORAL_CAPSULE | ORAL | Status: DC | PRN

## 2021-09-02 MED ORDER — HYDROMORPHONE 1 MG/ML IV SOLN
INTRAVENOUS | Status: DC
  Administered 2021-09-02: 6.5 mg via INTRAVENOUS
  Administered 2021-09-02 – 2021-09-03 (×2): 30 mg via INTRAVENOUS
  Administered 2021-09-03: 6 mg via INTRAVENOUS
  Administered 2021-09-03: 4.5 mg via INTRAVENOUS
  Administered 2021-09-03: 6.5 mg via INTRAVENOUS
  Administered 2021-09-03: 4 mg via INTRAVENOUS
  Administered 2021-09-04: 5 mg via INTRAVENOUS
  Administered 2021-09-04: 30 mg via INTRAVENOUS
  Administered 2021-09-04: 3.5 mg via INTRAVENOUS
  Administered 2021-09-04 (×2): 4.5 mg via INTRAVENOUS
  Administered 2021-09-04: 1.5 mg via INTRAVENOUS
  Administered 2021-09-05: 7.5 mg via INTRAVENOUS
  Filled 2021-09-02 (×3): qty 30

## 2021-09-02 MED ORDER — ENOXAPARIN SODIUM 40 MG/0.4ML IJ SOSY
40.0000 mg | PREFILLED_SYRINGE | INTRAMUSCULAR | Status: DC
  Administered 2021-09-02 – 2021-09-05 (×4): 40 mg via SUBCUTANEOUS
  Filled 2021-09-02 (×4): qty 0.4

## 2021-09-02 MED ORDER — FOLIC ACID 1 MG PO TABS
1.0000 mg | ORAL_TABLET | Freq: Every day | ORAL | Status: DC
  Administered 2021-09-02 – 2021-09-06 (×5): 1 mg via ORAL
  Filled 2021-09-02 (×5): qty 1

## 2021-09-02 MED ORDER — SENNOSIDES-DOCUSATE SODIUM 8.6-50 MG PO TABS
1.0000 | ORAL_TABLET | Freq: Two times a day (BID) | ORAL | Status: DC
  Administered 2021-09-02 – 2021-09-06 (×8): 1 via ORAL
  Filled 2021-09-02 (×9): qty 1

## 2021-09-02 MED ORDER — NALOXONE HCL 0.4 MG/ML IJ SOLN: 0.4000 mg | INTRAMUSCULAR | Status: DC | PRN

## 2021-09-02 MED ORDER — OXYCODONE HCL 5 MG PO TABS
10.0000 mg | ORAL_TABLET | ORAL | Status: DC | PRN
  Administered 2021-09-03 – 2021-09-06 (×11): 10 mg via ORAL
  Filled 2021-09-02 (×11): qty 2

## 2021-09-02 MED ORDER — HYDROXYUREA 500 MG PO CAPS
1500.0000 mg | ORAL_CAPSULE | Freq: Every day | ORAL | Status: DC
  Administered 2021-09-02 – 2021-09-06 (×5): 1500 mg via ORAL
  Filled 2021-09-02 (×5): qty 3

## 2021-09-02 MED ORDER — KETOROLAC TROMETHAMINE 15 MG/ML IJ SOLN
15.0000 mg | Freq: Four times a day (QID) | INTRAMUSCULAR | Status: DC
  Administered 2021-09-02 – 2021-09-06 (×17): 15 mg via INTRAVENOUS
  Filled 2021-09-02 (×16): qty 1

## 2021-09-02 MED ORDER — SODIUM CHLORIDE 0.9% FLUSH: 9.0000 mL | INTRAVENOUS | Status: DC | PRN

## 2021-09-02 NOTE — ED Triage Notes (Signed)
Pt reports with SCC since last night. Pt states that the pain is in both shoulders and his left leg. Pt states that he last took his pain medications at 0300.

## 2021-09-02 NOTE — H&P (Signed)
H&P  Patient Demographics:  Jonathan [name], is a 21 y.o. male  MRN: [id number]   DOB - 06/30/2001  Admit Date - 09/02/2021  Outpatient Primary MD for the patient is Quentin AngstJegede, Laurey Salser E, MD  Chief Complaint  Patient presents with   Sickle Cell Pain Crisis     HPI:   Jonathan [name] Kewley  is a 21 y.o. 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  He rated his pain at 10/10 when he go to the emergency room but now about 8/10.  He has a pain goal of less than 3/10.  He took his home pain medication before coming to the emergency room with no sustained relief.  He could not remember any inciting factor for this current crisis but noticed that of late there has been changes in weather which may be a factor.  He denies any fever, cough, chest pain, shortness of breath, nausea, vomiting or diarrhea.  He denies any recent travels, sick contacts or known exposure to COVID-19.  ED course:  Vital signs show: BP (!) 146/105    Pulse 97    Temp 98.6 F (37 C) (Oral)    Resp 20    SpO2 100%.  Mildly elevated WBC count of 11.6, hemoglobin is stable at baseline of 10.2, reticulocyte count is robust at about 7.5% with absolute reticulocyte count of 237.9, and LDH of 325.  Patient received multiple doses of IV Dilaudid, Toradol and Benadryl in the emergency room with no significant improvement in his pain.  Patient will be admitted to the hospital for further evaluation and management of sickle cell pain crisis.   Review of systems:  In addition to the HPI above, patient reports No fever or chills No Headache, No changes with vision or hearing No problems swallowing food or liquids No chest pain, cough or shortness of breath No abdominal pain, No nausea or vomiting, Bowel movements are regular No blood in stool or  urine No dysuria No new skin rashes or bruises No new weakness, tingling, numbness in any extremity No recent weight gain or loss No polyuria, polydypsia or polyphagia No significant Mental Stressors  A full 10 point Review of Systems was done, except as stated above, all other Review of Systems were negative.  With Past History of the following :   Past Medical History:  Diagnosis Date   Acute chest syndrome(517.3) 2013   march 2013, Sept 2013   Asthma    Avascular necrosis of bone of left hip (HCC)    Hb-SS disease with vaso-occlusive crisis (HCC) 07/28/2011   Sickle cell disease with crisis (HCC)    Sickle cell disease, type SS (HCC)    Sickle cell pain crisis (HCC) 10/02/2012   Vaso-occlusive sickle cell crisis (HCC) 11/09/2011      Past Surgical History:  Procedure Laterality Date   ADENOIDECTOMY     SPLENECTOMY     TONSILLECTOMY     TONSILLECTOMY AND ADENOIDECTOMY     TOTAL HIP ARTHROPLASTY Left 03/07/2016   UMBILICAL HERNIA REPAIR       Social History:   Social History   Tobacco Use   Smoking status: Never   Smokeless tobacco: Never  Substance Use Topics   Alcohol use: No    Alcohol/week: 0.0 standard drinks     Lives -  At home   Family History :   Family History  Problem Relation Age of Onset   Hypertension Mother    Hypertension Maternal Grandmother    Hypertension Maternal Grandfather    Asthma Paternal Grandfather    Diabetes Paternal Grandfather      Home Medications:   Prior to Admission medications   Medication Sig Start Date End Date Taking? Authorizing Provider  acetaminophen (TYLENOL) 500 MG tablet Take 500 mg by mouth every 6 (six) hours as needed for moderate pain.    [provider]  ergocalciferol (DRISDOL) 1.25 MG (50000 UT) capsule Take 1 capsule (50,000 Units total) by mouth once a week. Patient taking differently: Take 50,000 Units by mouth once a week. Sunday's 04/12/20   Barbette Merino, NP  folic acid (FOLVITE) 1 MG  tablet TAKE 1 TABLET(1 MG) BY MOUTH DAILY Patient taking differently: Take 1 mg by mouth daily. 08/14/20   Barbette Merino, NP  hydroxyurea (HYDREA) 500 MG capsule Take 3 capsules (1,500 mg total) by mouth daily. 12/20/20   Massie Maroon, FNP  ibuprofen (ADVIL) 800 MG tablet Take 1 tablet (800 mg total) by mouth every 6 (six) hours as needed for moderate pain. Patient not taking: Reported on 07/27/2021 06/28/21   Massie Maroon, FNP  Nutritional Supplements (COLD AND FLU PO) Take 2 capsules by mouth every 12 (twelve) hours as needed (cold symptoms).    [provider]  Oxycodone HCl 10 MG TABS Take 1 tablet (10 mg total) by mouth every 4 (four) hours as needed (pain). 08/02/21   Massie Maroon, FNP     Allergies:   Allergies  Allergen Reactions   Allergy Medication [Diphenhydramine]      Physical Exam:   Vitals:   Vitals:   09/02/21 1300 09/02/21 1330  BP: 133/80 (!) 130/92  Pulse: 80 81  Resp: 18 17  Temp:    SpO2: 95% 96%    Physical Exam: Constitutional: Patient appears well-developed and well-nourished. Not in obvious distress. HENT: Normocephalic, atraumatic, External right and left ear normal. Oropharynx is clear and moist.  Eyes: Conjunctivae and EOM are normal. PERRLA, no scleral icterus. Neck: Normal ROM. Neck supple. No JVD. No tracheal deviation. No thyromegaly. CVS: RRR, S1/S2 +, no murmurs, no gallops, no carotid bruit.  Pulmonary: Effort and breath sounds normal, no stridor, rhonchi, wheezes, rales.  Abdominal: Soft. BS +, no distension, tenderness, rebound or guarding.  Musculoskeletal: Normal range of motion. No edema and no tenderness.  Lymphadenopathy: No lymphadenopathy noted, cervical, inguinal or axillary Neuro: Alert. Normal reflexes, muscle tone coordination. No cranial nerve deficit. Skin: Skin is warm and dry. No rash noted. Not diaphoretic. No erythema. No pallor. Psychiatric: Normal mood and affect. Behavior, judgment, thought content  normal.   Data Review:   CBC Recent Labs  Lab 09/01/21 1200 09/02/21 0737  WBC 10.8* 11.6*  HGB 10.1* 10.2*  HCT 29.5* 30.1*  PLT 339 395  MCV 95.5 95.6  MCH 32.7 32.4  MCHC 34.2 33.9  RDW 20.0* 20.7*  LYMPHSABS 2.3 1.8  MONOABS 0.6 0.8  EOSABS 0.1 0.2  BASOSABS 0.1 0.1   ------------------------------------------------------------------------------------------------------------------  Chemistries  Recent Labs  Lab 09/01/21 1200 09/02/21 0737  NA 137 138  K 4.3 3.9  CL 107 104  CO2 23 25  GLUCOSE 116* 100*  BUN 9 9  CREATININE 0.78 0.75  CALCIUM 9.0 9.3  AST 27  --   ALT 12  --   ALKPHOS 144*  --  BILITOT 1.1  --    ------------------------------------------------------------------------------------------------------------------ estimated creatinine clearance is 126 mL/min (by C-G formula based on SCr of 0.75 mg/dL). ------------------------------------------------------------------------------------------------------------------ No results for input(s): TSH, T4TOTAL, T3FREE, THYROIDAB in the last 72 hours.  Invalid input(s): FREET3  Coagulation profile No results for input(s): INR, PROTIME in the last 168 hours. ------------------------------------------------------------------------------------------------------------------- No results for input(s): DDIMER in the last 72 hours. -------------------------------------------------------------------------------------------------------------------  Cardiac Enzymes No results for input(s): CKMB, TROPONINI, MYOGLOBIN in the last 168 hours.  Invalid input(s): CK ------------------------------------------------------------------------------------------------------------------ No results found for: BNP  ---------------------------------------------------------------------------------------------------------------  Urinalysis    Component Value Date/Time   COLORURINE YELLOW 10/13/2017 0042   APPEARANCEUR  CLEAR 10/13/2017 0042   LABSPEC 1.014 10/13/2017 0042   PHURINE 5.0 10/13/2017 0042   GLUCOSEU NEGATIVE 10/13/2017 0042   HGBUR NEGATIVE 10/13/2017 0042   BILIRUBINUR negative 04/12/2020 1159   KETONESUR negative 11/04/2018 1011   KETONESUR NEGATIVE 10/13/2017 0042   PROTEINUR Positive (A) 04/12/2020 1159   PROTEINUR 30 (A) 10/13/2017 0042   UROBILINOGEN 1.0 04/12/2020 1159   UROBILINOGEN 1.0 04/04/2015 1712   NITRITE Negative 04/12/2020 1159   NITRITE NEGATIVE 10/13/2017 0042   LEUKOCYTESUR Negative 04/12/2020 1159    ----------------------------------------------------------------------------------------------------------------   Imaging Results:    No results found.   Assessment & Plan:  Principal Problem:   Sickle cell disease with crisis [facility]) Active Problems:   Hypertension   Avascular necrosis of bone of left hip (HCC)   Anemia of chronic disease   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HCC)  Hb Sickle Cell Disease with crisis: Admit patient, start IVF 0.45% Saline @ 125 mls/hour, start weight based Dilaudid PCA, start IV Toradol 15 mg Q 6 H, Restart oral home pain medications, Monitor vitals very closely, Re-evaluate pain scale regularly, 2 L of Oxygen by Andrew, Patient will be re-evaluated for pain in the context of function and relationship to baseline as care progresses. Leukocytosis: Very mild and most likely reactive.  Will monitor very closely without antibiotics at this time.  Repeat labs in AM. Anemia of Chronic Disease: Hemoglobin is stable at baseline today.  There is no clinical indication for blood transfusion at this time.  We will monitor very closely and transfuse as appropriate. Chronic pain Syndrome: Restart and continue home pain medications. Mild intermittent asthma: Stable.  Continue home medication  DVT Prophylaxis: Subcut Lovenox   AM Labs Ordered, also please review Full Orders  Family Communication: Admission, patient's condition and plan of care  including tests being ordered have been discussed with the patient who indicate understanding and agree with the plan and Code Status.  Code Status: Full Code  Consults called: None    Admission status: Inpatient    Time spent in minutes : 50 minutes  Jeanann Lewandowsky MD, MHA, CPE, FACP 09/02/2021 at 2:00 PM

## 2021-09-02 NOTE — ED Provider Notes (Signed)
[facility] DEPT Provider Note   CSN: [id number] Arrival date & time: 09/02/21  [id number]     History  Chief Complaint  Patient presents with   Sickle Cell Pain Crisis    Jonathan [name] is a 21 y.o. male.  HPI  21 year old male with past medical history of sickle cell disease presents to the emergency department with pain in his bilateral shoulders and legs.  Patient states this is typical of his sickle cell exacerbation.  He is supposed to be on oxycodone daily however his prescription ran out over the weekend he was unable to get it refilled.  He last had ibuprofen this morning at 3 AM and is complaining of worsening pain.  Denies any chest pain, productive cough, fever, other infectious symptoms.  Home Medications Prior to Admission medications   Medication Sig Start Date End Date Taking? Authorizing Provider  acetaminophen (TYLENOL) 500 MG tablet Take 500 mg by mouth every 6 (six) hours as needed for moderate pain.    [provider]  ergocalciferol (DRISDOL) 1.25 MG (50000 UT) capsule Take 1 capsule (50,000 Units total) by mouth once a week. Patient taking differently: Take 50,000 Units by mouth once a week. Sunday's 04/12/20   Vevelyn Francois, NP  folic acid (FOLVITE) 1 MG tablet TAKE 1 TABLET(1 MG) BY MOUTH DAILY Patient taking differently: Take 1 mg by mouth daily. 08/14/20   Vevelyn Francois, NP  hydroxyurea (HYDREA) 500 MG capsule Take 3 capsules (1,500 mg total) by mouth daily. 12/20/20   Dorena Dew, FNP  ibuprofen (ADVIL) 200 MG tablet Take 200 mg by mouth every 6 (six) hours as needed for moderate pain.    [provider]  ibuprofen (ADVIL) 800 MG tablet Take 1 tablet (800 mg total) by mouth every 6 (six) hours as needed for moderate pain. Patient not taking: Reported on 07/27/2021 06/28/21   Dorena Dew, FNP  Nutritional Supplements (COLD AND FLU PO) Take 2 capsules by mouth every 12 (twelve) hours as needed (cold  symptoms).    [provider]  Oxycodone HCl 10 MG TABS Take 1 tablet (10 mg total) by mouth every 4 (four) hours as needed (pain). 08/02/21   Dorena Dew, FNP      Allergies    Allergy medication [diphenhydramine]    Review of Systems   Review of Systems  Constitutional:  Positive for fatigue. Negative for chills and fever.  HENT:  Negative for congestion.   Eyes:  Negative for visual disturbance.  Respiratory:  Negative for cough and shortness of breath.   Cardiovascular:  Negative for chest pain, palpitations and leg swelling.  Gastrointestinal:  Negative for abdominal pain, diarrhea and vomiting.  Musculoskeletal:  Positive for back pain and myalgias.  Skin:  Negative for rash.  Neurological:  Negative for headaches.   Physical Exam Updated Vital Signs BP (!) 146/105    Pulse 97    Temp 98.6 F (37 C) (Oral)    Resp 20    SpO2 100%  Physical Exam Vitals and nursing note reviewed.  Constitutional:      General: He is not in acute distress.    Appearance: Normal appearance. He is not diaphoretic.  HENT:     Head: Normocephalic.     Mouth/Throat:     Mouth: Mucous membranes are moist.  Cardiovascular:     Rate and Rhythm: Normal rate.  Pulmonary:     Effort: Pulmonary effort is normal. No respiratory distress.  Breath sounds: No wheezing or rales.  Abdominal:     Palpations: Abdomen is soft.     Tenderness: There is no abdominal tenderness.  Musculoskeletal:        General: No swelling.  Skin:    General: Skin is warm.  Neurological:     Mental Status: He is alert and oriented to person, place, and time. Mental status is at baseline.  Psychiatric:        Mood and Affect: Mood normal.    ED Results / Procedures / Treatments   Labs (all labs ordered are listed, but only abnormal results are displayed) Labs Reviewed  BASIC METABOLIC PANEL  CBC WITH DIFFERENTIAL/PLATELET  RETICULOCYTES    EKG None  Radiology No results  found.  Procedures Procedures    Medications Ordered in ED Medications  sodium chloride 0.9 % bolus 1,000 mL (has no administration in time range)  ondansetron (ZOFRAN) injection 4 mg (has no administration in time range)  HYDROmorphone (DILAUDID) injection 1 mg (has no administration in time range)    ED Course/ Medical Decision Making/ A&P                           Medical Decision Making  This patient presents to the ED for concern of shoulder and leg pain, this involves an extensive number of treatment options, and is a complaint that carries with it a high risk of complications and morbidity.  The differential diagnosis includes sickle cell pain crisis   Additional history obtained: -External records from outside source obtained and reviewed including: Chart review including previous notes, labs, imaging, consultation notes   Lab Tests: -I ordered, reviewed, and interpreted labs.  The pertinent results include: Baseline hemoglobin   Medicines ordered and prescription drug management: -I ordered medication including IV fluids, IV pain medicine, IV nausea medicine for sickle cell crisis -Reevaluation of the patient after these medicines showed that the patient improved -I have reviewed the patients home medicines and have made adjustments as needed   Consultations Obtained: I requested consultation with the sickle cell team,  and discussed lab and imaging findings as well as pertinent plan - they recommend: Admission   ED Course: 21 year old male presents emergency department with shoulders and leg pain.  States this is typical of his sickle cell pain.  He supposed to be on oral oxycodone every 4 hours however he ran out over the week and was unable to get it filled because it was the weekend.  Last dose of medication was ibuprofen at 3 AM.  Was tachycardic on arrival in triage, normal heart rate on my evaluation.  Sinus.  No signs of fever or cough, no concern for acute  chest at this time.  Was aggressive with 3 IV rounds of pain medicine and attempted to transition him to oral [place] which she did not tolerate.   Cardiac Monitoring: The patient was maintained on a cardiac monitor.  I personally viewed and interpreted the cardiac monitored which showed an underlying rhythm of: Sinus rhythm   Reevaluation: After the interventions noted above, I reevaluated the patient and found that they have :improved   Dispostion: Patients evaluation and results requires admission for further treatment and care.  Spoke with hospitalist Dr. Doreene Burke, reviewed patient's ED course and they accept admission.  Patient agrees with admission plan, offers no new complaints and is stable/unchanged at time of admit.        Final Clinical Impression(s) /  ED Diagnoses Final diagnoses:  None    Rx / DC Orders ED Discharge Orders     None         Lorelle Gibbs, DO 09/02/21 1415

## 2021-09-03 DIAGNOSIS — M87052 Idiopathic aseptic necrosis of left femur: Secondary | ICD-10-CM | POA: Diagnosis not present

## 2021-09-03 DIAGNOSIS — D638 Anemia in other chronic diseases classified elsewhere: Secondary | ICD-10-CM | POA: Diagnosis not present

## 2021-09-03 DIAGNOSIS — I10 Essential (primary) hypertension: Secondary | ICD-10-CM | POA: Diagnosis not present

## 2021-09-03 DIAGNOSIS — D5700 Hb-SS disease with crisis, unspecified: Secondary | ICD-10-CM | POA: Diagnosis not present

## 2021-09-03 LAB — CBC WITH DIFFERENTIAL/PLATELET
Abs Immature Granulocytes: 0.06 10*3/uL (ref 0.00–0.07)
Basophils Absolute: 0.1 10*3/uL (ref 0.0–0.1)
Basophils Relative: 1 %
Eosinophils Absolute: 0.2 10*3/uL (ref 0.0–0.5)
Eosinophils Relative: 2 %
HCT: 28.3 % — ABNORMAL LOW (ref 39.0–52.0)
Hemoglobin: 9.7 g/dL — ABNORMAL LOW (ref 13.0–17.0)
Immature Granulocytes: 1 %
Lymphocytes Relative: 19 %
Lymphs Abs: 1.9 10*3/uL (ref 0.7–4.0)
MCH: 32.7 pg (ref 26.0–34.0)
MCHC: 34.3 g/dL (ref 30.0–36.0)
MCV: 95.3 fL (ref 80.0–100.0)
Monocytes Absolute: 0.8 10*3/uL (ref 0.1–1.0)
Monocytes Relative: 8 %
Neutro Abs: 7.2 10*3/uL (ref 1.7–7.7)
Neutrophils Relative %: 69 %
Platelets: 370 10*3/uL (ref 150–400)
RBC: 2.97 MIL/uL — ABNORMAL LOW (ref 4.22–5.81)
RDW: 20.1 % — ABNORMAL HIGH (ref 11.5–15.5)
WBC: 10.3 10*3/uL (ref 4.0–10.5)
nRBC: 4.1 % — ABNORMAL HIGH (ref 0.0–0.2)

## 2021-09-03 LAB — BASIC METABOLIC PANEL
Anion gap: 9 (ref 5–15)
BUN: 9 mg/dL (ref 6–20)
CO2: 24 mmol/L (ref 22–32)
Calcium: 9.1 mg/dL (ref 8.9–10.3)
Chloride: 100 mmol/L (ref 98–111)
Creatinine, Ser: 0.71 mg/dL (ref 0.61–1.24)
GFR, Estimated: >60 mL/min (ref >60)
Glucose, Bld: 95 mg/dL (ref 70–99)
Potassium: 3.8 mmol/L (ref 3.5–5.1)
Sodium: 133 mmol/L — ABNORMAL LOW (ref 135–145)

## 2021-09-03 NOTE — Progress Notes (Signed)
°  Transition of Care (TOC) Screening Note   Patient Details  Name: Jonathan [name] Date of Birth: 2001-05-29   Transition of Care [facility]) CM/SW Contact:    Calie Buttrey, Marjie Skiff, RN Phone Number: 09/03/2021, 2:11 PM    Transition of Care Department [facility]) has reviewed patient and no TOC needs have been identified at this time. We will continue to monitor patient advancement through interdisciplinary progression rounds. If new patient transition needs arise, please place a TOC consult.

## 2021-09-03 NOTE — Progress Notes (Signed)
Patient ID: Jonathan [name], male   DOB: [birth year], 21 y.o.   MRN: [id number] Subjective: Jonathan [name] is a 21 year old male with a medical history significant for sickle cell disease was admitted for sickle cell pain crisis.   Patient is still in a lot of pain this morning, he rates his pain as 8/10, his goal is less than 3/10. He has no new complaint however. He denies any fever, cough, chest pain, shortness of breath, nausea, vomiting or diarrhea. No urinary symptoms. Patient is ambulating but with pain in his lower extremities.  Objective:  Vital signs in last 24 hours:  Vitals:   09/03/21 0934 09/03/21 1201 09/03/21 1359 09/03/21 1535  BP: 136/81  (!) 129/94   Pulse: (!) 106  89   Resp: 14 20 15 18   Temp: 99.1 F (37.3 C)  98.7 F (37.1 C)   TempSrc: Oral  Oral   SpO2: 93% 94% 95% 95%  Weight:      Height:        Intake/Output from previous day:   Intake/Output Summary (Last 24 hours) at 09/03/2021 1809 Last data filed at 09/03/2021 1532 Gross per 24 hour  Intake 3199.28 ml  Output 1850 ml  Net 1349.28 ml   Physical Exam: General: Alert, awake, oriented x3, in no acute distress.  HEENT: [place]/AT PEERL, EOMI Neck: Trachea midline,  no masses, no thyromegal,y no JVD, no carotid bruit OROPHARYNX:  Moist, No exudate/ erythema/lesions.  Heart: Regular rate and rhythm, without murmurs, rubs, gallops, PMI non-displaced, no heaves or thrills on palpation.  Lungs: Clear to auscultation, no wheezing or rhonchi noted. No increased vocal fremitus resonant to percussion  Abdomen: Soft, nontender, nondistended, positive bowel sounds, no masses no hepatosplenomegaly noted..  Neuro: No focal neurological deficits noted cranial nerves II through XII grossly intact. DTRs 2+ bilaterally upper and lower extremities. Strength 5 out of 5 in bilateral upper and lower extremities. Musculoskeletal: No warm swelling or erythema around joints, no spinal tenderness noted. Psychiatric: Patient alert  and oriented x3, good insight and cognition, good recent to remote recall. Lymph node survey: No cervical axillary or inguinal lymphadenopathy noted.  Lab Results:  Basic Metabolic Panel:    Component Value Date/Time   NA 133 (L) 09/03/2021 0449   NA 139 08/06/2021 1030   K 3.8 09/03/2021 0449   CL 100 09/03/2021 0449   CO2 24 09/03/2021 0449   BUN 9 09/03/2021 0449   BUN 9 08/06/2021 1030   CREATININE 0.71 09/03/2021 0449   CREATININE 0.70 05/12/2016 1052   GLUCOSE 95 09/03/2021 0449   CALCIUM 9.1 09/03/2021 0449   CBC:    Component Value Date/Time   WBC 10.3 09/03/2021 0449   HGB 9.7 (L) 09/03/2021 0449   HGB 7.6 (L) 08/06/2021 1030   HCT 28.3 (L) 09/03/2021 0449   HCT 23.6 (L) 08/06/2021 1030   PLT 370 09/03/2021 0449   PLT 958 (HH) 08/06/2021 1030   MCV 95.3 09/03/2021 0449   MCV 96 08/06/2021 1030   NEUTROABS 7.2 09/03/2021 0449   NEUTROABS 3.4 08/06/2021 1030   LYMPHSABS 1.9 09/03/2021 0449   LYMPHSABS 2.4 08/06/2021 1030   MONOABS 0.8 09/03/2021 0449   EOSABS 0.2 09/03/2021 0449   EOSABS 0.2 08/06/2021 1030   BASOSABS 0.1 09/03/2021 0449   BASOSABS 0.1 08/06/2021 1030    No results found for this or any previous visit (from the past 240 hour(s)).  Studies/Results: No results found.  Medications: Scheduled Meds:  enoxaparin (LOVENOX) injection  40 mg Subcutaneous [id number]   folic acid  1 mg Oral Daily   HYDROmorphone   Intravenous Q4H   hydroxyurea  1,500 mg Oral Daily   ketorolac  15 mg Intravenous Q6H   senna-docusate  1 tablet Oral BID   Continuous Infusions:  sodium chloride 125 mL/hr at 09/03/21 1654   diphenhydrAMINE     PRN Meds:.diphenhydrAMINE **OR** diphenhydrAMINE, naloxone **AND** sodium chloride flush, ondansetron (ZOFRAN) IV, oxyCODONE, polyethylene glycol  Consultants: None  Procedures: None  Antibiotics: None  Assessment/Plan: Principal Problem:   Sickle cell disease with crisis ([place]) Active Problems:   Hypertension    Avascular necrosis of bone of left hip (HCC)   Anemia of chronic disease   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HCC)  Hb Sickle Cell Disease with Pain crisis: Reduce IV fluid to 50 cc/h.  Continue weight based Dilaudid PCA at current dose setting, continue IV Toradol 15 mg Q 6 H for total of 5 days, restart and continue oral home pain medications.  Monitor vitals very closely, Re-evaluate pain scale regularly, 2 L of Oxygen by [place]. Leukocytosis: Very mild.  Most likely reactive. We will continue to monitor without antibiotics. Anemia of Chronic Disease: Hemoglobin is stable at baseline today. There is no clinical indication for blood transfusion. Continue to monitor closely and transfuse as appropriate. Chronic pain Syndrome: Continue home pain medications. Mild intermittent asthma: Stable. Continue home medications.  Code Status: Full Code Family Communication: N/A Disposition Plan: Not yet ready for discharge  Jonathan [name]  If 7PM-7AM, please contact night-coverage.  09/03/2021, 6:09 PM  LOS: 1 day

## 2021-09-04 DIAGNOSIS — D5700 Hb-SS disease with crisis, unspecified: Secondary | ICD-10-CM | POA: Diagnosis not present

## 2021-09-04 MED ORDER — ACETAMINOPHEN 325 MG PO TABS: 650.0000 mg | ORAL_TABLET | ORAL | Status: DC | PRN

## 2021-09-04 MED ORDER — HYDROMORPHONE HCL 1 MG/ML IJ SOLN
1.0000 mg | Freq: Once | INTRAMUSCULAR | Status: AC
  Administered 2021-09-04: 1 mg via INTRAVENOUS
  Filled 2021-09-04: qty 1

## 2021-09-04 NOTE — Progress Notes (Signed)
Subjective: Jonathan [name] is a 21 year old male with a medical history significant for sickle cell disease was admitted for sickle cell pain crisis. Patient continues to have significant pain primarily to upper extremities.  He states that pain intensity is 10/10 and is not improving.  He denies any chest pain, shortness of breath, urinary symptoms, nausea, vomiting, or diarrhea.  Objective:  Vital signs in last 24 hours:  Vitals:   09/04/21 0307 09/04/21 0455 09/04/21 1023 09/04/21 1248  BP:  (!) 146/90 (!) 144/80   Pulse:  90 96   Resp: 15 14 16 14   Temp:  98 F (36.7 C) 98.1 F (36.7 C)   TempSrc:  Oral Oral   SpO2: 95% 97% 98% 92%  Weight:      Height:        Intake/Output from previous day:   Intake/Output Summary (Last 24 hours) at 09/04/2021 1259 Last data filed at 09/04/2021 1000 Gross per 24 hour  Intake 2579.32 ml  Output 1900 ml  Net 679.32 ml    Physical Exam: General: Alert, awake, oriented x3, in no acute distress.  HEENT: [place]/AT PEERL, EOMI Neck: Trachea midline,  no masses, no thyromegal,y no JVD, no carotid bruit OROPHARYNX:  Moist, No exudate/ erythema/lesions.  Heart: Regular rate and rhythm, without murmurs, rubs, gallops, PMI non-displaced, no heaves or thrills on palpation.  Lungs: Clear to auscultation, no wheezing or rhonchi noted. No increased vocal fremitus resonant to percussion  Abdomen: Soft, nontender, nondistended, positive bowel sounds, no masses no hepatosplenomegaly noted..  Neuro: No focal neurological deficits noted cranial nerves II through XII grossly intact. DTRs 2+ bilaterally upper and lower extremities. Strength 5 out of 5 in bilateral upper and lower extremities. Musculoskeletal: No warm swelling or erythema around joints, no spinal tenderness noted. Psychiatric: Patient alert and oriented x3, good insight and cognition, good recent to remote recall. Lymph node survey: No cervical axillary or inguinal lymphadenopathy noted.  Lab  Results:  Basic Metabolic Panel:    Component Value Date/Time   NA 133 (L) 09/03/2021 0449   NA 139 08/06/2021 1030   K 3.8 09/03/2021 0449   CL 100 09/03/2021 0449   CO2 24 09/03/2021 0449   BUN 9 09/03/2021 0449   BUN 9 08/06/2021 1030   CREATININE 0.71 09/03/2021 0449   CREATININE 0.70 05/12/2016 1052   GLUCOSE 95 09/03/2021 0449   CALCIUM 9.1 09/03/2021 0449   CBC:    Component Value Date/Time   WBC 10.3 09/03/2021 0449   HGB 9.7 (L) 09/03/2021 0449   HGB 7.6 (L) 08/06/2021 1030   HCT 28.3 (L) 09/03/2021 0449   HCT 23.6 (L) 08/06/2021 1030   PLT 370 09/03/2021 0449   PLT 958 (HH) 08/06/2021 1030   MCV 95.3 09/03/2021 0449   MCV 96 08/06/2021 1030   NEUTROABS 7.2 09/03/2021 0449   NEUTROABS 3.4 08/06/2021 1030   LYMPHSABS 1.9 09/03/2021 0449   LYMPHSABS 2.4 08/06/2021 1030   MONOABS 0.8 09/03/2021 0449   EOSABS 0.2 09/03/2021 0449   EOSABS 0.2 08/06/2021 1030   BASOSABS 0.1 09/03/2021 0449   BASOSABS 0.1 08/06/2021 1030    No results found for this or any previous visit (from the past 240 hour(s)).  Studies/Results: No results found.  Medications: Scheduled Meds:  enoxaparin (LOVENOX) injection  40 mg Subcutaneous Q24H   folic acid  1 mg Oral Daily   HYDROmorphone   Intravenous Q4H   hydroxyurea  1,500 mg Oral Daily   ketorolac  15 mg Intravenous  Q6H   senna-docusate  1 tablet Oral BID   Continuous Infusions:  sodium chloride 10 mL/hr at 09/04/21 1018   diphenhydrAMINE     PRN Meds:.acetaminophen, diphenhydrAMINE **OR** diphenhydrAMINE, naloxone **AND** sodium chloride flush, ondansetron (ZOFRAN) IV, oxyCODONE, polyethylene glycol  Consultants: none  Procedures: none  Antibiotics: none  Assessment/Plan: Principal Problem:   Sickle cell disease with crisis (HCC) Active Problems:   Hypertension   Avascular necrosis of bone of left hip (HCC)   Anemia of chronic disease   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HCC)  Sickle cell disease with pain  crisis: Patient's pain persists despite IV Dilaudid PCA.  He is not maximizing PCA.  Patient is only used 9.5 mg over the past 12 hours.  Patient encouraged to use PCA.  Settings will not be changed at this time. Oxycodone 10 mg every 4 hours as needed for severe breakthrough pain Monitor vital signs very closely, reevaluate pain scale regularly, and supplemental oxygen as needed  Chronic pain syndrome: Continue home medications  Leukocytosis: Very mild, more than likely secondary to sickle cell disease.  No apparent infection.  Monitor closely without antibiotics.  Labs in AM.  Anemia of chronic disease: Hemoglobin remained stable and consistent with patient's baseline.  There is no clinical indication for blood transfusion at this time.  Continue to follow closely.  Mild intermittent asthma: Stable.  Continue home medications as needed.  Code Status: Full Code Family Communication: N/A Disposition Plan: Not yet ready for discharge   Cheryl Stabenow Rennis Petty  APRN, MSN, FNP-C [facility] [address] [place], [place] [address] [phone]  If 5PM-8AM, please contact night-coverage.  09/04/2021, 12:59 PM  LOS: 2 days

## 2021-09-05 DIAGNOSIS — D5700 Hb-SS disease with crisis, unspecified: Secondary | ICD-10-CM | POA: Diagnosis not present

## 2021-09-05 MED ORDER — HYDROMORPHONE 1 MG/ML IV SOLN
INTRAVENOUS | Status: DC
  Administered 2021-09-05: 30 mg via INTRAVENOUS
  Administered 2021-09-05: 3.5 mg via INTRAVENOUS
  Administered 2021-09-05: 5.5 mg via INTRAVENOUS
  Filled 2021-09-05: qty 30

## 2021-09-05 NOTE — Progress Notes (Signed)
Subjective: Jonathan [name] is a 21 year old male with a medical history significant for sickle cell disease was admitted for sickle cell pain crisis. Patient states that pain intensity has improved on today.  He rates his pain at 4/10.  He states that he cannot manage at home at current pain intensity.  He denies headache, chest pain, urinary symptoms, nausea, vomiting, or diarrhea.  Objective:  Vital signs in last 24 hours:  Vitals:   09/05/21 0952 09/05/21 1101 09/05/21 1221 09/05/21 1359  BP:  (!) 136/96  132/76  Pulse:  95  88  Resp: 14 15 14 13   Temp:  98.2 F (36.8 C)  98.5 F (36.9 C)  TempSrc:  Oral  Oral  SpO2: 92% (!) 85% 91% 90%  Weight:      Height:        Intake/Output from previous day:   Intake/Output Summary (Last 24 hours) at 09/05/2021 1453 Last data filed at 09/05/2021 1258 Gross per 24 hour  Intake 241 ml  Output 1700 ml  Net -1459 ml    Physical Exam: General: Alert, awake, oriented x3, in no acute distress.  HEENT: [place]/AT PEERL, EOMI Neck: Trachea midline,  no masses, no thyromegal,y no JVD, no carotid bruit OROPHARYNX:  Moist, No exudate/ erythema/lesions.  Heart: Regular rate and rhythm, without murmurs, rubs, gallops, PMI non-displaced, no heaves or thrills on palpation.  Lungs: Clear to auscultation, no wheezing or rhonchi noted. No increased vocal fremitus resonant to percussion  Abdomen: Soft, nontender, nondistended, positive bowel sounds, no masses no hepatosplenomegaly noted..  Neuro: No focal neurological deficits noted cranial nerves II through XII grossly intact. DTRs 2+ bilaterally upper and lower extremities. Strength 5 out of 5 in bilateral upper and lower extremities. Musculoskeletal: No warm swelling or erythema around joints, no spinal tenderness noted. Psychiatric: Patient alert and oriented x3, good insight and cognition, good recent to remote recall. Lymph node survey: No cervical axillary or inguinal lymphadenopathy noted.  Lab  Results:  Basic Metabolic Panel:    Component Value Date/Time   NA 133 (L) 09/03/2021 0449   NA 139 08/06/2021 1030   K 3.8 09/03/2021 0449   CL 100 09/03/2021 0449   CO2 24 09/03/2021 0449   BUN 9 09/03/2021 0449   BUN 9 08/06/2021 1030   CREATININE 0.71 09/03/2021 0449   CREATININE 0.70 05/12/2016 1052   GLUCOSE 95 09/03/2021 0449   CALCIUM 9.1 09/03/2021 0449   CBC:    Component Value Date/Time   WBC 10.3 09/03/2021 0449   HGB 9.7 (L) 09/03/2021 0449   HGB 7.6 (L) 08/06/2021 1030   HCT 28.3 (L) 09/03/2021 0449   HCT 23.6 (L) 08/06/2021 1030   PLT 370 09/03/2021 0449   PLT 958 (HH) 08/06/2021 1030   MCV 95.3 09/03/2021 0449   MCV 96 08/06/2021 1030   NEUTROABS 7.2 09/03/2021 0449   NEUTROABS 3.4 08/06/2021 1030   LYMPHSABS 1.9 09/03/2021 0449   LYMPHSABS 2.4 08/06/2021 1030   MONOABS 0.8 09/03/2021 0449   EOSABS 0.2 09/03/2021 0449   EOSABS 0.2 08/06/2021 1030   BASOSABS 0.1 09/03/2021 0449   BASOSABS 0.1 08/06/2021 1030    No results found for this or any previous visit (from the past 240 hour(s)).  Studies/Results: No results found.  Medications: Scheduled Meds:  enoxaparin (LOVENOX) injection  40 mg Subcutaneous Q24H   folic acid  1 mg Oral Daily   HYDROmorphone   Intravenous Q4H   hydroxyurea  1,500 mg Oral Daily   ketorolac  15 mg Intravenous Q6H   senna-docusate  1 tablet Oral BID   Continuous Infusions:  sodium chloride 10 mL/hr at 09/04/21 1018   diphenhydrAMINE     PRN Meds:.acetaminophen, diphenhydrAMINE **OR** diphenhydrAMINE, naloxone **AND** sodium chloride flush, ondansetron (ZOFRAN) IV, oxyCODONE, polyethylene glycol  Consultants: None  Procedures: None  Antibiotics: None  Assessment/Plan: Principal Problem:   Sickle cell disease with crisis (HCC) Active Problems:   Hypertension   Avascular necrosis of bone of left hip (HCC)   Anemia of chronic disease   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HCC)  Sickle cell disease with pain  crisis: Weaning IV Dilaudid PCA Oxycodone 10 mg every 4 hours as needed for severe breakthrough pain Toradol 15 mg IV every 6 hours for total of 5 days Monitor vital signs very closely, reevaluate pain scale regularly, and supplemental oxygen as needed  Chronic pain syndrome: Continue home medications  Leukocytosis: Stable.  Secondary to sickle cell disease.  No apparent infection.  Monitor closely.  Labs in AM.  Anemia of chronic disease: Hemoglobin remained stable and consistent with patient's baseline.  There is no clinical indication for blood transfusion at this time.  Continue to follow closely.  Mild intermittent asthma: Stable.  Continue home medications as needed.  Code Status: Full Code Family Communication: N/A Disposition Plan: Not yet ready for discharge.  Discharge planned for 09/06/2021  [name]  APRN, MSN, FNP-C Patient Care [facility] [address] [place], [place] [address] [phone]  If 7PM-7AM, please contact night-coverage.  09/05/2021, 2:53 PM  LOS: 3 days

## 2021-09-06 DIAGNOSIS — D5700 Hb-SS disease with crisis, unspecified: Secondary | ICD-10-CM | POA: Diagnosis not present

## 2021-09-06 LAB — CBC
HCT: 21.4 % — ABNORMAL LOW (ref 39.0–52.0)
Hemoglobin: 7.4 g/dL — ABNORMAL LOW (ref 13.0–17.0)
MCH: 32.2 pg (ref 26.0–34.0)
MCHC: 34.6 g/dL (ref 30.0–36.0)
MCV: 93 fL (ref 80.0–100.0)
Platelets: 506 10*3/uL — ABNORMAL HIGH (ref 150–400)
RBC: 2.3 MIL/uL — ABNORMAL LOW (ref 4.22–5.81)
RDW: 20.1 % — ABNORMAL HIGH (ref 11.5–15.5)
WBC: 7.8 10*3/uL (ref 4.0–10.5)
nRBC: 3.8 % — ABNORMAL HIGH (ref 0.0–0.2)

## 2021-09-06 MED ORDER — OXYCODONE HCL 10 MG PO TABS: 10.0000 mg | ORAL_TABLET | ORAL | 0 refills | Status: DC | PRN

## 2021-09-06 NOTE — Discharge Summary (Signed)
Physician Discharge Summary  Jonathan [name] BCW:[id number] DOB: November 30, 2000 DOA: 09/02/2021  PCP: Jonathan Angst, MD  Admit date: 09/02/2021  Discharge date: 09/06/2021  Discharge Diagnoses:  Principal Problem:   Sickle cell disease with crisis [name]) Active Problems:   Hypertension   Avascular necrosis of bone of left hip (HCC)   Anemia of chronic disease   Sickle cell anemia with crisis [facility])   Discharge Condition: Stable  Disposition:  Pt is discharged home in good condition and is to follow up with Jonathan Angst, MD this week to have labs evaluated. Jonathan [name] is instructed to increase activity slowly and balance with rest for the next few days, and use prescribed medication to complete treatment of pain  Diet: Regular Wt Readings from Last 3 Encounters:  09/02/21 65.5 kg  09/01/21 71.7 kg  07/27/21 65.1 kg    History of present illness:  Jonathan [name] is a 21 year old male with a medical history significant for sickle cell disease and avascular necrosis of left hip joint who came to the emergency room with major complaints of generalized body pain that is consistent with his typical sickle cell pain crisis.  He rated his pain as 10/10 when he got to the emergency department, but is now decreased to about 8/10.  He has a pain goal of less than 3/10.  He took his pain medication before coming to the ER with no sustained relief.  He cannot remember any inciting factors for this current crisis, but noticeable late that changes in weather may be a factor.  He denies any fever, cough, chest pain, shortness of breath, nausea, vomiting, or diarrhea.  He denies any recent travel, sick contacts or known exposure to COVID-19.  ED course: Vital signs show: BP 146/105, pulse 97, temperature 98.6 F, respirations 20, oxygen saturation 100%.  Mildly elevated WBC count of 11.6, hemoglobin is stable at baseline of 10.2, reticulocyte count is robust at about 7.5% with absolute  reticulocyte count of 237.9, and LDH of 325.  Patient received multiple doses of IV Dilaudid, Toradol, and Benadryl in the emergency room with no significant improvement in his pain.  Patient will be admitted to the hospital for further evaluation and management of sickle cell pain crisis.  Hospital Course:  Sickle cell disease with pain crisis: Patient was admitted for sickle cell pain crisis and managed appropriately with IVF, IV Dilaudid via PCA and IV Toradol, as well as other adjunct therapies per sickle cell pain management protocols.  Patient's pain intensity decreased.  IV Dilaudid PCA was weaned appropriately and patient transition to his home medications.  Oxycodone 10 mg every 6 hours as needed #30 was sent to patient's pharmacy.  PDMP substance reporting system was reviewed, no inconsistencies noted. Patient will follow-up with PCP in 1 week for medication management and to repeat CBC with differential and BMP.  Patient is aware of all upcoming appointments. He is alert, oriented, and ambulating without assistance.  Patient was therefore discharged home today in a hemodynamically stable condition.   [name] will follow-up with PCP within 1 week of this discharge. [name] was counseled extensively about nonpharmacologic means of pain management, patient verbalized understanding and was appreciative of  the care received during this admission.   We discussed the need for good hydration, monitoring of hydration status, avoidance of heat, cold, stress, and infection triggers. We discussed the need to be adherent with taking Hydrea and other home medications. Patient was reminded of the need to seek  medical attention immediately if any symptom of bleeding, anemia, or infection occurs.  Discharge Exam: Vitals:   09/06/21 0827 09/06/21 1001  BP:  (!) 144/87  Pulse:  (!) 107  Resp: 17 18  Temp:  98.4 F (36.9 C)  SpO2: 95% 95%   Vitals:   09/06/21 0412 09/06/21 0413 09/06/21 0827  09/06/21 1001  BP:  (!) 142/88  (!) 144/87  Pulse:  (!) 101  (!) 107  Resp: 16 18 17 18   Temp:  99.6 F (37.6 C)  98.4 F (36.9 C)  TempSrc:  Oral  Oral  SpO2: 97% 91% 95% 95%  Weight:      Height:        General appearance : Awake, alert, not in any distress. Speech Clear. Not toxic looking HEENT: Atraumatic and Normocephalic, pupils equally reactive to light and accomodation Neck: Supple, no JVD. No cervical lymphadenopathy.  Chest: Good air entry bilaterally, no added sounds  CVS: S1 S2 regular, no murmurs.  Abdomen: Bowel sounds present, Non tender and not distended with no gaurding, rigidity or rebound. Extremities: B/L Lower Ext shows no edema, both legs are warm to touch Neurology: Awake alert, and oriented X 3, CN II-XII intact, Non focal Skin: No Rash  Discharge Instructions  Discharge Instructions     Discharge patient   Complete by: As directed    Discharge disposition: 01-Home or Self Care   Discharge patient date: 09/06/2021      Allergies as of 09/06/2021       Reactions   Allergy Medication [diphenhydramine] Swelling   Face swells up        Medication List     TAKE these medications    ergocalciferol 1.25 MG (50000 UT) capsule Commonly known as: Drisdol Take 1 capsule (50,000 Units total) by mouth once a week. What changed: additional instructions   folic acid 1 MG tablet Commonly known as: FOLVITE TAKE 1 TABLET(1 MG) BY MOUTH DAILY What changed: See the new instructions.   hydroxyurea 500 MG capsule Commonly known as: HYDREA Take 3 capsules (1,500 mg total) by mouth daily.   ibuprofen 800 MG tablet Commonly known as: ADVIL Take 1 tablet (800 mg total) by mouth every 6 (six) hours as needed for moderate pain.   Oxycodone HCl 10 MG Tabs Take 1 tablet (10 mg total) by mouth every 4 (four) hours as needed (pain).        The results of significant diagnostics from this hospitalization (including imaging, microbiology, ancillary and  laboratory) are listed below for reference.    Significant Diagnostic Studies: No results found.  Microbiology: No results found for this or any previous visit (from the past 240 hour(s)).   Labs: Basic Metabolic Panel: Recent Labs  Lab 09/01/21 1200 09/02/21 0737 09/03/21 0449  NA 137 138 133*  K 4.3 3.9 3.8  CL 107 104 100  CO2 23 25 24   GLUCOSE 116* 100* 95  BUN 9 9 9   CREATININE 0.78 0.75 0.71  CALCIUM 9.0 9.3 9.1   Liver Function Tests: Recent Labs  Lab 09/01/21 1200  AST 27  ALT 12  ALKPHOS 144*  BILITOT 1.1  PROT 7.5  ALBUMIN 4.2   No results for input(s): LIPASE, AMYLASE in the last 168 hours. No results for input(s): AMMONIA in the last 168 hours. CBC: Recent Labs  Lab 09/01/21 1200 09/02/21 0737 09/03/21 0449 09/06/21 0952  WBC 10.8* 11.6* 10.3 7.8  NEUTROABS 7.3 8.6* 7.2  --   HGB 10.1*  10.2* 9.7* 7.4*  HCT 29.5* 30.1* 28.3* 21.4*  MCV 95.5 95.6 95.3 93.0  PLT 339 395 370 506*   Cardiac Enzymes: No results for input(s): CKTOTAL, CKMB, CKMBINDEX, TROPONINI in the last 168 hours. BNP: Invalid input(s): POCBNP CBG: No results for input(s): GLUCAP in the last 168 hours.  Time coordinating discharge: 30 minutes  Signed:  Nolon NationsLachina Moore Kalisha Keadle  APRN, MSN, FNP-C Patient Care [facility] [address]  [place], [place] [id number] [phone]  [facility] 09/06/2021, 1:47 PM

## 2021-09-20 ENCOUNTER — Telehealth: Payer: Self-pay | Admitting: *Deleted

## 2021-09-20 NOTE — Telephone Encounter (Signed)
Transition Care Management Unsuccessful Follow-up Telephone Call  Date of discharge and from where:  09/06/21  [facility]  Attempts:  1st Attempt  Reason for unsuccessful TCM follow-up call:  No answer/busy  Irving Shows [facility] [employer], BSN RN Case Manager [phone]

## 2021-09-24 ENCOUNTER — Telehealth: Payer: Self-pay

## 2021-09-24 NOTE — Telephone Encounter (Signed)
Transition Care Management Unsuccessful Follow-up Telephone Call  Date of discharge and from where:  09/06/21 from [facility]  Attempts:  2nd Attempt  Reason for unsuccessful TCM follow-up call: No answer. Left voice message  Thea Silversmith, RN, MSN, BSN, [place] Care Management Coordinator [phone]

## 2021-10-01 ENCOUNTER — Telehealth: Payer: Self-pay | Admitting: Internal Medicine

## 2021-10-01 NOTE — Telephone Encounter (Signed)
Requesting refills on Oxycodone 10mg  Ibuprofen 800mg  Hydroxyurea

## 2021-10-02 ENCOUNTER — Telehealth: Payer: Self-pay

## 2021-10-02 ENCOUNTER — Other Ambulatory Visit: Payer: Self-pay | Admitting: Family Medicine

## 2021-10-02 DIAGNOSIS — Z79891 Long term (current) use of opiate analgesic: Secondary | ICD-10-CM

## 2021-10-02 DIAGNOSIS — D571 Sickle-cell disease without crisis: Secondary | ICD-10-CM

## 2021-10-02 MED ORDER — IBUPROFEN 800 MG PO TABS: 800.0000 mg | ORAL_TABLET | Freq: Four times a day (QID) | ORAL | 2 refills | Status: DC | PRN

## 2021-10-02 MED ORDER — OXYCODONE HCL 10 MG PO TABS: 10.0000 mg | ORAL_TABLET | ORAL | 0 refills | Status: DC | PRN

## 2021-10-02 MED ORDER — HYDROXYUREA 500 MG PO CAPS: 1500.0000 mg | ORAL_CAPSULE | Freq: Every day | ORAL | 2 refills | Status: DC

## 2021-10-02 NOTE — Progress Notes (Signed)
Reviewed PDMP substance reporting system prior to prescribing opiate medications. No inconsistencies noted.  Meds ordered this encounter  Medications   Oxycodone HCl 10 MG TABS    Sig: Take 1 tablet (10 mg total) by mouth every 4 (four) hours as needed (pain).    Dispense:  30 tablet    Refill:  0    Order Specific Question:   Supervising Provider    Answer:   Quentin Angst [[id number]]   ibuprofen (ADVIL) 800 MG tablet    Sig: Take 1 tablet (800 mg total) by mouth every 6 (six) hours as needed for moderate pain.    Dispense:  30 tablet    Refill:  2    Order Specific Question:   Supervising Provider    Answer:   Quentin Angst [[id number]]   hydroxyurea (HYDREA) 500 MG capsule    Sig: Take 3 capsules (1,500 mg total) by mouth daily.    Dispense:  90 capsule    Refill:  2    Order Specific Question:   Supervising Provider    Answer:   Quentin Angst [[id number]]   [name]  APRN, MSN, FNP-C Patient Care [facility] [address] [place], [place] [address] [phone]

## 2021-10-11 ENCOUNTER — Telehealth (HOSPITAL_COMMUNITY): Payer: Self-pay | Admitting: General Practice

## 2021-10-11 ENCOUNTER — Other Ambulatory Visit: Payer: Self-pay

## 2021-10-11 ENCOUNTER — Emergency Department (HOSPITAL_COMMUNITY)
Admission: EM | Admit: 2021-10-11 | Discharge: 2021-10-11 | Disposition: A | Discharge status: Home / Self Care | Payer: Commercial Managed Care - PPO | Source: Home / Self Care | Attending: Emergency Medicine | Admitting: Emergency Medicine

## 2021-10-11 ENCOUNTER — Encounter (HOSPITAL_COMMUNITY): Payer: Self-pay

## 2021-10-11 ENCOUNTER — Inpatient Hospital Stay (HOSPITAL_COMMUNITY)
Admission: EM | Admit: 2021-10-11 | Discharge: 2021-10-15 | DRG: 812 | Disposition: A | Discharge status: Home / Self Care | Payer: Commercial Managed Care - PPO | Attending: Internal Medicine | Admitting: Internal Medicine

## 2021-10-11 ENCOUNTER — Emergency Department (HOSPITAL_COMMUNITY): Payer: Commercial Managed Care - PPO

## 2021-10-11 DIAGNOSIS — D5700 Hb-SS disease with crisis, unspecified: Secondary | ICD-10-CM | POA: Insufficient documentation

## 2021-10-11 DIAGNOSIS — Z9081 Acquired absence of spleen: Secondary | ICD-10-CM

## 2021-10-11 DIAGNOSIS — J45909 Unspecified asthma, uncomplicated: Secondary | ICD-10-CM | POA: Diagnosis present

## 2021-10-11 DIAGNOSIS — D638 Anemia in other chronic diseases classified elsewhere: Secondary | ICD-10-CM | POA: Diagnosis present

## 2021-10-11 DIAGNOSIS — Z79899 Other long term (current) drug therapy: Secondary | ICD-10-CM

## 2021-10-11 DIAGNOSIS — J449 Chronic obstructive pulmonary disease, unspecified: Secondary | ICD-10-CM | POA: Diagnosis present

## 2021-10-11 DIAGNOSIS — Z96642 Presence of left artificial hip joint: Secondary | ICD-10-CM | POA: Diagnosis present

## 2021-10-11 DIAGNOSIS — D571 Sickle-cell disease without crisis: Secondary | ICD-10-CM | POA: Diagnosis present

## 2021-10-11 DIAGNOSIS — Z888 Allergy status to other drugs, medicaments and biological substances status: Secondary | ICD-10-CM

## 2021-10-11 DIAGNOSIS — Z20822 Contact with and (suspected) exposure to covid-19: Secondary | ICD-10-CM | POA: Diagnosis present

## 2021-10-11 DIAGNOSIS — I10 Essential (primary) hypertension: Secondary | ICD-10-CM | POA: Diagnosis present

## 2021-10-11 DIAGNOSIS — G894 Chronic pain syndrome: Secondary | ICD-10-CM | POA: Diagnosis present

## 2021-10-11 LAB — CBC WITH DIFFERENTIAL/PLATELET
Abs Immature Granulocytes: 1.35 10*3/uL — ABNORMAL HIGH (ref 0.00–0.07)
Basophils Absolute: 0.1 10*3/uL (ref 0.0–0.1)
Basophils Relative: 1 %
Eosinophils Absolute: 0.1 10*3/uL (ref 0.0–0.5)
Eosinophils Relative: 1 %
HCT: 31.9 % — ABNORMAL LOW (ref 39.0–52.0)
Hemoglobin: 10.9 g/dL — ABNORMAL LOW (ref 13.0–17.0)
Immature Granulocytes: 10 %
Lymphocytes Relative: 20 %
Lymphs Abs: 2.7 10*3/uL (ref 0.7–4.0)
MCH: 31 pg (ref 26.0–34.0)
MCHC: 34.2 g/dL (ref 30.0–36.0)
MCV: 90.6 fL (ref 80.0–100.0)
Monocytes Absolute: 0.8 10*3/uL (ref 0.1–1.0)
Monocytes Relative: 6 %
Neutro Abs: 8.4 10*3/uL — ABNORMAL HIGH (ref 1.7–7.7)
Neutrophils Relative %: 62 %
Platelets: 501 10*3/uL — ABNORMAL HIGH (ref 150–400)
RBC: 3.52 MIL/uL — ABNORMAL LOW (ref 4.22–5.81)
RDW: 18.2 % — ABNORMAL HIGH (ref 11.5–15.5)
WBC: 13.4 10*3/uL — ABNORMAL HIGH (ref 4.0–10.5)
nRBC: 1.2 % — ABNORMAL HIGH (ref 0.0–0.2)

## 2021-10-11 LAB — RETICULOCYTES
Immature Retic Fract: 29.5 % — ABNORMAL HIGH (ref 2.3–15.9)
RBC.: 3.5 MIL/uL — ABNORMAL LOW (ref 4.22–5.81)
Retic Count, Absolute: 103.5 10*3/uL (ref 19.0–186.0)
Retic Ct Pct: 3 % (ref 0.4–3.1)

## 2021-10-11 LAB — COMPREHENSIVE METABOLIC PANEL
ALT: 11 U/L (ref 0–44)
AST: 19 U/L (ref 15–41)
Albumin: 4.8 g/dL (ref 3.5–5.0)
Alkaline Phosphatase: 120 U/L (ref 38–126)
Anion gap: 7 (ref 5–15)
BUN: 8 mg/dL (ref 6–20)
CO2: 25 mmol/L (ref 22–32)
Calcium: 9.5 mg/dL (ref 8.9–10.3)
Chloride: 109 mmol/L (ref 98–111)
Creatinine, Ser: 0.76 mg/dL (ref 0.61–1.24)
GFR, Estimated: >60 mL/min (ref >60)
Glucose, Bld: 103 mg/dL — ABNORMAL HIGH (ref 70–99)
Potassium: 4.1 mmol/L (ref 3.5–5.1)
Sodium: 141 mmol/L (ref 135–145)
Total Bilirubin: 1 mg/dL (ref 0.3–1.2)
Total Protein: 8.8 g/dL — ABNORMAL HIGH (ref 6.5–8.1)

## 2021-10-11 MED ORDER — ONDANSETRON HCL 4 MG/2ML IJ SOLN
4.0000 mg | INTRAMUSCULAR | Status: DC | PRN
  Administered 2021-10-11: 4 mg via INTRAVENOUS
  Filled 2021-10-11: qty 2

## 2021-10-11 MED ORDER — ONDANSETRON HCL 4 MG/2ML IJ SOLN
4.0000 mg | Freq: Once | INTRAMUSCULAR | Status: AC
  Administered 2021-10-11: 4 mg via INTRAVENOUS
  Filled 2021-10-11: qty 2

## 2021-10-11 MED ORDER — HYDROMORPHONE HCL 1 MG/ML IJ SOLN
1.0000 mg | INTRAMUSCULAR | Status: AC
  Administered 2021-10-11: 1 mg via INTRAVENOUS
  Filled 2021-10-11: qty 1

## 2021-10-11 MED ORDER — HYDROMORPHONE HCL 1 MG/ML IJ SOLN
0.5000 mg | INTRAMUSCULAR | Status: AC
  Administered 2021-10-11: 0.5 mg via INTRAVENOUS
  Filled 2021-10-11: qty 1

## 2021-10-11 MED ORDER — HYDROMORPHONE HCL 2 MG/ML IJ SOLN
2.0000 mg | INTRAMUSCULAR | Status: AC
  Administered 2021-10-11: 2 mg via INTRAVENOUS
  Filled 2021-10-11: qty 1

## 2021-10-11 MED ORDER — DEXTROSE-NACL 5-0.45 % IV SOLN: INTRAVENOUS | Status: DC

## 2021-10-11 MED ORDER — OXYCODONE-ACETAMINOPHEN 5-325 MG PO TABS
1.0000 | ORAL_TABLET | Freq: Once | ORAL | Status: AC
  Administered 2021-10-11: 1 via ORAL
  Filled 2021-10-11: qty 1

## 2021-10-11 MED ORDER — KETOROLAC TROMETHAMINE 15 MG/ML IJ SOLN
15.0000 mg | INTRAMUSCULAR | Status: AC
  Administered 2021-10-11: 15 mg via INTRAVENOUS
  Filled 2021-10-11: qty 1

## 2021-10-11 MED ORDER — KETOROLAC TROMETHAMINE 15 MG/ML IJ SOLN
15.0000 mg | Freq: Once | INTRAMUSCULAR | Status: AC
  Administered 2021-10-11: 15 mg via INTRAVENOUS
  Filled 2021-10-11: qty 1

## 2021-10-11 NOTE — Discharge Instructions (Signed)
Return for worsening pain, fever, new area of pain.  Follow up with your doctor in the office.

## 2021-10-11 NOTE — ED Triage Notes (Signed)
Patient came with sickle cell pain crisis that began Saturday. Pain in his back and right arm. Patient was here 2 hours ago.

## 2021-10-11 NOTE — ED Provider Notes (Signed)
[facility]-EMERGENCY DEPT Provider Note   CSN: [id number] Arrival date & time: 10/11/21  1320     History  Chief Complaint  Patient presents with   Sickle Cell Pain Crisis    Jonathan [name] is a 21 y.o. male.  21 yo M with a chief complaints of sickle cell pain crisis.  Diffuse pain all over.  Feels like prior sickle cell.  Going on for a few days now.  He is not sure why it flared on him this time.  He denies fevers denies cough or congestion.  Denies any new areas of pain.   Sickle Cell Pain Crisis     Home Medications Prior to Admission medications   Medication Sig Start Date End Date Taking? Authorizing Provider  ergocalciferol (DRISDOL) 1.25 MG (50000 UT) capsule Take 1 capsule (50,000 Units total) by mouth once a week. Patient taking differently: Take 50,000 Units by mouth once a week. Sunday's 04/12/20   Barbette Merino, NP  folic acid (FOLVITE) 1 MG tablet TAKE 1 TABLET(1 MG) BY MOUTH DAILY Patient taking differently: Take 1 mg by mouth once a week. Sundays 08/14/20   Barbette Merino, NP  hydroxyurea (HYDREA) 500 MG capsule Take 3 capsules (1,500 mg total) by mouth daily. 10/02/21   Massie Maroon, FNP  ibuprofen (ADVIL) 800 MG tablet Take 1 tablet (800 mg total) by mouth every 6 (six) hours as needed for moderate pain. 10/02/21   Massie Maroon, FNP  Oxycodone HCl 10 MG TABS Take 1 tablet (10 mg total) by mouth every 4 (four) hours as needed (pain). 10/02/21   Massie Maroon, FNP      Allergies    Allergy medication [diphenhydramine]    Review of Systems   Review of Systems  Physical Exam Updated Vital Signs BP (!) 144/91    Pulse 69    Temp 98.4 F (36.9 C) (Oral)    Resp 16    Ht 5\' 5"  (1.651 m)    Wt 68 kg    SpO2 97%    BMI 24.96 kg/m  Physical Exam Vitals and nursing note reviewed.  Constitutional:      Appearance: He is well-developed.  HENT:     Head: Normocephalic and atraumatic.  Eyes:     Pupils: Pupils are equal,  round, and reactive to light.  Neck:     Vascular: No JVD.  Cardiovascular:     Rate and Rhythm: Normal rate and regular rhythm.     Heart sounds: No murmur heard.   No friction rub. No gallop.  Pulmonary:     Effort: No respiratory distress.     Breath sounds: No wheezing.  Abdominal:     General: There is no distension.     Tenderness: There is no abdominal tenderness. There is no guarding or rebound.  Musculoskeletal:        General: Normal range of motion.     Cervical back: Normal range of motion and neck supple.  Skin:    Coloration: Skin is not pale.     Findings: No rash.  Neurological:     Mental Status: He is alert and oriented to person, place, and time.  Psychiatric:        Behavior: Behavior normal.    ED Results / Procedures / Treatments   Labs (all labs ordered are listed, but only abnormal results are displayed) Labs Reviewed  COMPREHENSIVE METABOLIC PANEL - Abnormal; Notable for the following components:  Result Value   Glucose, Bld 103 (*)    Total Protein 8.8 (*)    All other components within normal limits  CBC WITH DIFFERENTIAL/PLATELET - Abnormal; Notable for the following components:   WBC 13.4 (*)    RBC 3.52 (*)    Hemoglobin 10.9 (*)    HCT 31.9 (*)    RDW 18.2 (*)    Platelets 501 (*)    nRBC 1.2 (*)    Neutro Abs 8.4 (*)    Abs Immature Granulocytes 1.35 (*)    All other components within normal limits  RETICULOCYTES - Abnormal; Notable for the following components:   RBC. 3.50 (*)    Immature Retic Fract 29.5 (*)    All other components within normal limits    EKG None  Radiology DG Chest Portable 1 View  Result Date: 10/11/2021 CLINICAL DATA:  Shortness of breath.  Sickle cell crisis. EXAM: PORTABLE CHEST 1 VIEW COMPARISON:  07/27/2021 FINDINGS: Streaky opacity at the left base is similar to prior compatible with atelectasis or scarring. No edema or focal airspace consolidation. The cardiopericardial silhouette is within  normal limits for size. IMPRESSION: Stable exam. Electronically Signed   By: Kennith Center M.D.   On: 10/11/2021 14:37    Procedures Procedures    Medications Ordered in ED Medications  oxyCODONE-acetaminophen (PERCOCET/ROXICET) 5-325 MG per tablet 1 tablet (1 tablet Oral Given 10/11/21 1532)  HYDROmorphone (DILAUDID) injection 2 mg (2 mg Intravenous Given 10/11/21 1606)  HYDROmorphone (DILAUDID) injection 2 mg (2 mg Intravenous Given 10/11/21 1652)  HYDROmorphone (DILAUDID) injection 2 mg (2 mg Intravenous Given 10/11/21 1744)  ketorolac (TORADOL) 15 MG/ML injection 15 mg (15 mg Intravenous Given 10/11/21 1652)  ondansetron (ZOFRAN) injection 4 mg (4 mg Intravenous Given 10/11/21 1815)    ED Course/ Medical Decision Making/ A&P                           Medical Decision Making Risk Prescription drug management.   Patient is a 21 y.o. male with a cc of a chief complaint of sickle cell pain crisis.  Going on for a few days now.  The patient does not have a care plan in our system.  We will treat per the sickle cell order set.  Hemoglobin appears to be above baseline.  Chest x-ray independently interpreted by me without focal infiltrate.    No significant electrolyte abnormality renal function at baseline.  On reassessment the patient is feeling better and feels like he could probably go home.  Will discharge.  Sickle cell follow-up.  :  I have discussed the diagnosis/risks/treatment options with the patient.  Evaluation and diagnostic testing in the emergency department does not suggest an emergent condition requiring admission or immediate intervention beyond what has been performed at this time.  They will follow up with  PCP, sicklecell. We also discussed returning to the ED immediately if new or worsening sx occur. We discussed the sx which are most concerning (e.g., sudden worsening pain, fever, inability to tolerate by mouth) that necessitate immediate return. Medications administered to  the patient during their visit and any new prescriptions provided to the patient are listed below.  Medications given during this visit Medications  oxyCODONE-acetaminophen (PERCOCET/ROXICET) 5-325 MG per tablet 1 tablet (1 tablet Oral Given 10/11/21 1532)  HYDROmorphone (DILAUDID) injection 2 mg (2 mg Intravenous Given 10/11/21 1606)  HYDROmorphone (DILAUDID) injection 2 mg (2 mg Intravenous Given 10/11/21 1652)  HYDROmorphone (  DILAUDID) injection 2 mg (2 mg Intravenous Given 10/11/21 1744)  ketorolac (TORADOL) 15 MG/ML injection 15 mg (15 mg Intravenous Given 10/11/21 1652)  ondansetron (ZOFRAN) injection 4 mg (4 mg Intravenous Given 10/11/21 1815)     The patient appears reasonably screen and/or stabilized for discharge and I doubt any other medical condition or other [facility] requiring further screening, evaluation, or treatment in the ED at this time prior to discharge.          Final Clinical Impression(s) / ED Diagnoses Final diagnoses:  Sickle cell pain crisis [facility])    Rx / DC Orders ED Discharge Orders     None         Melene Plan, DO 10/11/21 2210

## 2021-10-11 NOTE — ED Provider Triage Note (Signed)
Emergency Medicine Provider Triage Evaluation Note  Miliano Cotten Derflinger , a 21 y.o. male  was evaluated in triage.  Pt complains of sickle cell pain crises that started 6 days ago. No chest pain or SOB. Denies fever and chills. Patient seen a few hours earlier with reassuring labs. He returns due to continued pain. Pain is located in right arm and low back.  Review of Systems  Positive: Arthralgia, back pain Negative: CP  Physical Exam  BP (!) 140/107    Pulse 95    Temp 97.9 F (36.6 C) (Oral)    Resp 16    SpO2 98%  Gen:   Awake, no distress   Resp:  Normal effort  MSK:   Moves extremities without difficulty  Other:    Medical Decision Making  Medically screening exam initiated at 8:48 PM.  Appropriate orders placed.  Orbin T Briney was informed that the remainder of the evaluation will be completed by another provider, this initial triage assessment does not replace that evaluation, and the importance of remaining in the ED until their evaluation is complete.  Sickle cell pain crisis. Stable labs a few hours prior. Will need pain medication.    Mannie Stabile, [place] 10/11/21 2050

## 2021-10-11 NOTE — ED Provider Notes (Signed)
[name] Provider Note   CSN: [id number] Arrival date & time: 10/11/21  2006     History  Chief Complaint  Patient presents with   Sickle Cell Pain Crisis    Jonathan [name] is a 21 y.o. male with a past medical history of sickle cell SS disease who presents emergency department chief complaint of sickle cell pain crisis.  Patient was seen and evaluated earlier today by Dr. Adela Lank.  His labs appear consistent with his baseline.  He thinks that he may be having a crisis due to the cold weather snap.  He was treated with IV pain medications earlier was feeling much better and discharged however he returned tonight because his pain was not under control and he complains of severe diffuse body pain at this time.  He denies fever or chills.  He is on oxycodone but does not take it daily and only takes it when he is in severe pain.  Sickle Cell Pain Crisis     Home Medications Prior to Admission medications   Medication Sig Start Date End Date Taking? Authorizing Provider  ergocalciferol (DRISDOL) 1.25 MG (50000 UT) capsule Take 1 capsule (50,000 Units total) by mouth once a week. Patient taking differently: Take 50,000 Units by mouth once a week. Sunday's 04/12/20   Barbette Merino, NP  folic acid (FOLVITE) 1 MG tablet TAKE 1 TABLET(1 MG) BY MOUTH DAILY Patient taking differently: Take 1 mg by mouth once a week. Sundays 08/14/20   Barbette Merino, NP  hydroxyurea (HYDREA) 500 MG capsule Take 3 capsules (1,500 mg total) by mouth daily. 10/02/21   Massie Maroon, FNP  ibuprofen (ADVIL) 800 MG tablet Take 1 tablet (800 mg total) by mouth every 6 (six) hours as needed for moderate pain. 10/02/21   Massie Maroon, FNP  Oxycodone HCl 10 MG TABS Take 1 tablet (10 mg total) by mouth every 4 (four) hours as needed (pain). 10/02/21   Massie Maroon, FNP      Allergies    Allergy medication [diphenhydramine]    Review of Systems   Review of  Systems  Physical Exam Updated Vital Signs BP (!) 150/111 (BP Location: Left Arm)    Pulse (!) 116    Temp 97.9 F (36.6 C) (Oral)    Resp 18    SpO2 98%  Physical Exam Vitals and nursing note reviewed.  Constitutional:      General: He is not in acute distress.    Appearance: He is well-developed. He is not diaphoretic.     Comments: Appears very uncomfortable   HENT:     Head: Normocephalic and atraumatic.  Eyes:     General: No scleral icterus.    Extraocular Movements: Extraocular movements intact.     Conjunctiva/sclera: Conjunctivae normal.     Pupils: Pupils are equal, round, and reactive to light.  Cardiovascular:     Rate and Rhythm: Regular rhythm. Tachycardia present.     Heart sounds: Normal heart sounds.  Pulmonary:     Effort: Pulmonary effort is normal. No respiratory distress.     Breath sounds: Normal breath sounds.  Abdominal:     Palpations: Abdomen is soft.     Tenderness: There is no abdominal tenderness.  Musculoskeletal:     Cervical back: Normal range of motion and neck supple.  Skin:    General: Skin is warm and dry.     Capillary Refill: Capillary refill takes less than 2 seconds.  Neurological:     Mental Status: He is alert and oriented to person, place, and time.  Psychiatric:        Behavior: Behavior normal.    ED Results / Procedures / Treatments   Labs (all labs ordered are listed, but only abnormal results are displayed) Labs Reviewed  RESP PANEL BY RT-PCR (FLU A&B, COVID) [id number]    EKG None  Radiology DG Chest Portable 1 View  Result Date: 10/11/2021 CLINICAL DATA:  Shortness of breath.  Sickle cell crisis. EXAM: PORTABLE CHEST 1 VIEW COMPARISON:  07/27/2021 FINDINGS: Streaky opacity at the left base is similar to prior compatible with atelectasis or scarring. No edema or focal airspace consolidation. The cardiopericardial silhouette is within normal limits for size. IMPRESSION: Stable exam. Electronically Signed   By: Kennith Center M.D.   On: 10/11/2021 14:37    Procedures Procedures    Medications Ordered in ED Medications  dextrose 5 %-0.45 % sodium chloride infusion (has no administration in time range)  ketorolac (TORADOL) 15 MG/ML injection 15 mg (has no administration in time range)  ondansetron (ZOFRAN) injection 4 mg (has no administration in time range)  HYDROmorphone (DILAUDID) injection 0.5 mg (has no administration in time range)  HYDROmorphone (DILAUDID) injection 1 mg (has no administration in time range)  HYDROmorphone (DILAUDID) injection 1 mg (has no administration in time range)    ED Course/ Medical Decision Making/ A&P                           Medical Decision Making Patient here with SS pain crisis.  Seen earlier today and failed OP therapy returns with uncontrolled pain.  I reviewed labs from earlier today which showed no acute changes from the patient's baseline. Is afebrile and hemodynamically stable.  I have low suspicion for acute chest syndrome, PE.  He has no neurologic deficits concerning for stroke.   Risk Prescription drug management. Decision regarding hospitalization.    Final Clinical Impression(s) / ED Diagnoses Final diagnoses:  Sickle cell pain crisis [facility])    Rx / DC Orders ED Discharge Orders     None         Arthor Captain, PA-C 10/11/21 2314    Melene Plan, DO 10/16/21 2258

## 2021-10-11 NOTE — ED Provider Triage Note (Signed)
Emergency Medicine Provider Triage Evaluation Note  Jonathan [name] , a 21 y.o. male  was evaluated in triage.  Pt complains of sickle cell pain. Report pain to legs and back x 6 days that felt similar to prior sickle cell crisis  Review of Systems  Positive: Legs and back pain Negative: Fever, cp, sob, cough  Physical Exam  BP (!) 155/108 (BP Location: Left Arm)    Pulse (!) 122    Temp 98.4 F (36.9 C) (Oral)    Resp 18    SpO2 100%  Gen:   Awake, no distress   Resp:  Normal effort  MSK:   Moves extremities without difficulty  Other:    Medical Decision Making  Medically screening exam initiated at 2:09 PM.  Appropriate orders placed.  Diamante T Schlossberg was informed that the remainder of the evaluation will be completed by another provider, this initial triage assessment does not replace that evaluation, and the importance of remaining in the ED until their evaluation is complete.  Sickle cell pain.   Fayrene Helper, PA-C 10/11/21 1411

## 2021-10-11 NOTE — Telephone Encounter (Signed)
Patient called, requesting to come to the day hospital due to pain in the  legs and arms  rated at 6/10. Per provider, the [facility] does not have the capacity to admit today. Patient reminded to call at 08:00 am for triage Monday through Friday. Patient told to go to the ER if the pain is unmanageable. Patient verbalized understanding.

## 2021-10-12 DIAGNOSIS — Z888 Allergy status to other drugs, medicaments and biological substances status: Secondary | ICD-10-CM | POA: Diagnosis not present

## 2021-10-12 DIAGNOSIS — Z20822 Contact with and (suspected) exposure to covid-19: Secondary | ICD-10-CM | POA: Diagnosis present

## 2021-10-12 DIAGNOSIS — D638 Anemia in other chronic diseases classified elsewhere: Secondary | ICD-10-CM

## 2021-10-12 DIAGNOSIS — G894 Chronic pain syndrome: Secondary | ICD-10-CM | POA: Diagnosis present

## 2021-10-12 DIAGNOSIS — D5700 Hb-SS disease with crisis, unspecified: Secondary | ICD-10-CM | POA: Diagnosis present

## 2021-10-12 DIAGNOSIS — Z9081 Acquired absence of spleen: Secondary | ICD-10-CM | POA: Diagnosis not present

## 2021-10-12 DIAGNOSIS — J449 Chronic obstructive pulmonary disease, unspecified: Secondary | ICD-10-CM | POA: Diagnosis present

## 2021-10-12 DIAGNOSIS — I10 Essential (primary) hypertension: Secondary | ICD-10-CM

## 2021-10-12 DIAGNOSIS — Z96642 Presence of left artificial hip joint: Secondary | ICD-10-CM | POA: Diagnosis present

## 2021-10-12 DIAGNOSIS — Z79899 Other long term (current) drug therapy: Secondary | ICD-10-CM | POA: Diagnosis not present

## 2021-10-12 LAB — RESP PANEL BY RT-PCR (FLU A&B, COVID) ARPGX2
Influenza A by PCR: NEGATIVE
Influenza B by PCR: NEGATIVE
SARS Coronavirus 2 by RT PCR: NEGATIVE

## 2021-10-12 MED ORDER — KETOROLAC TROMETHAMINE 15 MG/ML IJ SOLN
15.0000 mg | Freq: Four times a day (QID) | INTRAMUSCULAR | Status: DC
  Administered 2021-10-12 – 2021-10-15 (×14): 15 mg via INTRAVENOUS
  Filled 2021-10-12 (×14): qty 1

## 2021-10-12 MED ORDER — SODIUM CHLORIDE 0.9% FLUSH: 9.0000 mL | INTRAVENOUS | Status: DC | PRN

## 2021-10-12 MED ORDER — VITAMIN D (ERGOCALCIFEROL) 1.25 MG (50000 UNIT) PO CAPS
50000.0000 [IU] | ORAL_CAPSULE | ORAL | Status: DC
  Administered 2021-10-13: 50000 [IU] via ORAL
  Filled 2021-10-12: qty 1

## 2021-10-12 MED ORDER — HYDROMORPHONE HCL 2 MG/ML IJ SOLN
2.0000 mg | INTRAMUSCULAR | Status: AC | PRN
  Administered 2021-10-12: 2 mg via INTRAVENOUS
  Filled 2021-10-12: qty 1

## 2021-10-12 MED ORDER — POLYETHYLENE GLYCOL 3350 17 G PO PACK: 17.0000 g | PACK | Freq: Every day | ORAL | Status: DC | PRN

## 2021-10-12 MED ORDER — HYDROMORPHONE 1 MG/ML IV SOLN
INTRAVENOUS | Status: DC
  Administered 2021-10-12: 2.8 mg via INTRAVENOUS
  Administered 2021-10-13: 1.8 mg via INTRAVENOUS
  Administered 2021-10-13: 2.1 mg via INTRAVENOUS
  Filled 2021-10-12 (×2): qty 30

## 2021-10-12 MED ORDER — FOLIC ACID 1 MG PO TABS
1.0000 mg | ORAL_TABLET | ORAL | Status: DC
Start: 2021-10-12 — End: 2021-10-12

## 2021-10-12 MED ORDER — ONDANSETRON HCL 4 MG/2ML IJ SOLN: 4.0000 mg | Freq: Four times a day (QID) | INTRAMUSCULAR | Status: DC | PRN

## 2021-10-12 MED ORDER — OXYCODONE HCL 5 MG PO TABS
10.0000 mg | ORAL_TABLET | Freq: Once | ORAL | Status: AC
  Administered 2021-10-12: 10 mg via ORAL
  Filled 2021-10-12: qty 2

## 2021-10-12 MED ORDER — NALOXONE HCL 0.4 MG/ML IJ SOLN: 0.4000 mg | INTRAMUSCULAR | Status: DC | PRN

## 2021-10-12 MED ORDER — HYDROXYUREA 500 MG PO CAPS
1500.0000 mg | ORAL_CAPSULE | Freq: Every day | ORAL | Status: DC
  Administered 2021-10-12 – 2021-10-15 (×4): 1500 mg via ORAL
  Filled 2021-10-12 (×4): qty 3

## 2021-10-12 MED ORDER — HYDROMORPHONE HCL 1 MG/ML IJ SOLN
1.0000 mg | INTRAMUSCULAR | Status: DC | PRN
  Administered 2021-10-12 (×5): 1 mg via INTRAVENOUS
  Filled 2021-10-12 (×5): qty 1

## 2021-10-12 MED ORDER — SENNOSIDES-DOCUSATE SODIUM 8.6-50 MG PO TABS
1.0000 | ORAL_TABLET | Freq: Two times a day (BID) | ORAL | Status: DC
  Administered 2021-10-12 – 2021-10-15 (×8): 1 via ORAL
  Filled 2021-10-12 (×8): qty 1

## 2021-10-12 MED ORDER — ENOXAPARIN SODIUM 40 MG/0.4ML IJ SOSY
40.0000 mg | PREFILLED_SYRINGE | INTRAMUSCULAR | Status: DC
  Administered 2021-10-12 – 2021-10-15 (×4): 40 mg via SUBCUTANEOUS
  Filled 2021-10-12 (×4): qty 0.4

## 2021-10-12 MED ORDER — FOLIC ACID 1 MG PO TABS
1.0000 mg | ORAL_TABLET | Freq: Every day | ORAL | Status: DC
  Administered 2021-10-12 – 2021-10-15 (×4): 1 mg via ORAL
  Filled 2021-10-12 (×4): qty 1

## 2021-10-12 MED ORDER — HYDROMORPHONE HCL 2 MG/ML IJ SOLN
2.0000 mg | INTRAMUSCULAR | Status: DC | PRN
  Administered 2021-10-12: 2 mg via INTRAVENOUS
  Filled 2021-10-12: qty 1

## 2021-10-12 NOTE — Plan of Care (Signed)
Yellow MEWS, see Flowsheets BP 159/104, HR 111 Dr. Mikeal Hawthorne, MD notified. No new orders at this time. Will continue to monitor.  Farrel Gobble, RN, BSN

## 2021-10-12 NOTE — H&P (Signed)
History and Physical    Jonathan [name] OMB:[id number] DOB: 11/13/2000 DOA: 10/11/2021  DOS: the patient was seen and examined on 10/11/2021  PCP: Quentin Angst, MD   Patient coming from: Home  I have personally briefly reviewed patient's old medical records in [facility]  Jonathan [name] is a 21 y/o man with SSD with multiple crisis in the past. He was seen earlier 10/11/21 for SSD pain and was able to be discharged home. During the day his pain became much worse and he presented back to [facility] for evaluation and treatment   ED Course: Vitals stable with BP 143/95. LAb was unremarkable. IN ED he was given dose of ketorolac and had several doses of IV dilaudid. TRH called to admit for continue management of SSD crisis.  Review of Systems:  Review of Systems  Constitutional: Negative.   HENT: Negative.    Eyes: Negative.   Respiratory: Negative.    Cardiovascular: Negative.   Gastrointestinal: Negative.   Musculoskeletal:  Positive for joint pain.  Skin: Negative.   Neurological: Negative.   Endo/Heme/Allergies: Negative.   Psychiatric/Behavioral: Negative.     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   reports that he has never smoked. He has never used smokeless tobacco. He reports that he does not drink alcohol and does not use  drugs.  Allergies  Allergen Reactions   Allergy Medication [Diphenhydramine] Swelling    Face swells up    Family History  Problem Relation Age of Onset   Hypertension Mother    Hypertension Maternal Grandmother    Hypertension Maternal Grandfather    Asthma Paternal Grandfather    Diabetes Paternal Grandfather     Prior to Admission medications   Medication Sig Start Date End Date Taking? Authorizing Provider  ergocalciferol (DRISDOL) 1.25 MG (50000 UT) capsule Take 1 capsule (50,000 Units total) by mouth once a week. Patient taking differently: Take 50,000 Units by mouth once a week. Sunday's 04/12/20  Yes King, Shana Chute, NP  folic acid (FOLVITE) 1 MG tablet TAKE 1 TABLET(1 MG) BY MOUTH DAILY Patient taking differently: Take 1 mg by mouth once a week. Sundays 08/14/20  Yes Barbette Merino, NP  hydroxyurea (HYDREA) 500 MG capsule Take 3 capsules (1,500 mg total) by mouth daily. 10/02/21  Yes Massie Maroon, FNP  Oxycodone HCl 10 MG TABS Take 1 tablet (10 mg total) by mouth every 4 (four) hours as needed (pain). 10/02/21  Yes Massie Maroon, FNP  ibuprofen (ADVIL) 800 MG tablet Take 1 tablet (800 mg total) by mouth every 6 (six) hours as needed for moderate pain. 10/02/21   Massie Maroon, FNP    Physical Exam: Vitals:   10/11/21 2330 10/12/21 0000 10/12/21 0002  10/12/21 0030  BP: (!) 150/89 (!) 143/95 (!) 143/95 (!) 143/114  Pulse: 87 78 70 88  Resp: 18 16 16 13   Temp:      TempSrc:      SpO2: 93% 94% 93% 96%    Physical Exam Vitals reviewed.  Constitutional:      Appearance: Normal appearance.     Comments: Uncomfortable and restless in the bed  HENT:     Head: Normocephalic and atraumatic.     Nose: Nose normal.     Mouth/Throat:     Mouth: Mucous membranes are dry.  Eyes:     Extraocular Movements: Extraocular movements intact.     Conjunctiva/sclera: Conjunctivae normal.     Pupils: Pupils are equal, round, and reactive to light.  Cardiovascular:     Rate  and Rhythm: Normal rate and regular rhythm.  Pulmonary:     Effort: Pulmonary effort is normal.     Breath sounds: Normal breath sounds.  Abdominal:     General: Abdomen is flat. Bowel sounds are normal.     Palpations: Abdomen is soft.  Musculoskeletal:        General: Tenderness present. Normal range of motion.     Cervical back: Normal range of motion and neck supple.  Skin:    General: Skin is warm and dry.     Comments: Body art chest and arms  Neurological:     General: No focal deficit present.     Mental Status: He is alert and oriented to person, place, and time.  Psychiatric:        Mood and Affect: Mood normal.     Labs on Admission: I have personally reviewed following labs and imaging studies  CBC: Recent Labs  Lab 10/11/21 1531  WBC 13.4*  NEUTROABS 8.4*  HGB 10.9*  HCT 31.9*  MCV 90.6  PLT 501*   Basic Metabolic Panel: Recent Labs  Lab 10/11/21 1531  NA 141  K 4.1  CL 109  CO2 25  GLUCOSE 103*  BUN 8  CREATININE 0.76  CALCIUM 9.5   GFR: Estimated Creatinine Clearance: 126 mL/min (by C-G formula based on SCr of 0.76 mg/dL). Liver Function Tests: Recent Labs  Lab 10/11/21 1531  AST 19  ALT 11  ALKPHOS 120  BILITOT 1.0  PROT 8.8*  ALBUMIN 4.8   No results for input(s): LIPASE, AMYLASE in the last 168 hours. No results for input(s): AMMONIA in the last 168 hours. Coagulation Profile: No results for input(s): INR, PROTIME in the last 168 hours. Cardiac Enzymes: No results for input(s): CKTOTAL, CKMB, CKMBINDEX, TROPONINI in the last 168 hours. BNP (last 3 results) No results for input(s): PROBNP in the last 8760 hours. HbA1C: No results for input(s): HGBA1C in the last 72 hours. CBG: No results for input(s): GLUCAP in the last 168 hours. Lipid Profile: No results for input(s): CHOL, HDL, LDLCALC, TRIG, CHOLHDL, LDLDIRECT in the last 72 hours. Thyroid Function Tests: No results for input(s): TSH, T4TOTAL, FREET4, T3FREE, THYROIDAB in  the last 72 hours. Anemia Panel: Recent Labs    10/11/21 1531  RETICCTPCT 3.0   Urine analysis:    Component Value Date/Time   COLORURINE YELLOW 10/13/2017 0042   APPEARANCEUR CLEAR 10/13/2017 0042   LABSPEC 1.014 10/13/2017 0042   PHURINE 5.0 10/13/2017 0042   GLUCOSEU NEGATIVE 10/13/2017 0042   HGBUR NEGATIVE 10/13/2017 0042   BILIRUBINUR negative 04/12/2020 1159   KETONESUR negative 11/04/2018 1011   KETONESUR NEGATIVE 10/13/2017 0042  PROTEINUR Positive (A) 04/12/2020 1159   PROTEINUR 30 (A) 10/13/2017 0042   UROBILINOGEN 1.0 04/12/2020 1159   UROBILINOGEN 1.0 04/04/2015 1712   NITRITE Negative 04/12/2020 1159   NITRITE NEGATIVE 10/13/2017 0042   LEUKOCYTESUR Negative 04/12/2020 1159    Radiological Exams on Admission: I have personally reviewed images DG Chest Portable 1 View  Result Date: 10/11/2021 CLINICAL DATA:  Shortness of breath.  Sickle cell crisis. EXAM: PORTABLE CHEST 1 VIEW COMPARISON:  07/27/2021 FINDINGS: Streaky opacity at the left base is similar to prior compatible with atelectasis or scarring. No edema or focal airspace consolidation. The cardiopericardial silhouette is within normal limits for size. IMPRESSION: Stable exam. Electronically Signed   By: Kennith Center M.D.   On: 10/11/2021 14:37    EKG: I have personally reviewed EKG: no EKG on chart  Assessment/Plan Active Problems:   Sickle cell crisis (HCC)   Reactive airway disease   Hypertension   Anemia of chronic disease    Assessment and Plan: Sickle cell crisis (HCC)- (present on admission) Recurrent flare with pain. Denies chest pain, respiratory symtoms.  Plan obs admit to med-surg  PCA patient analgesia  Ketorolac 15 mg q6  Hydration  Continue hydroxyurea and folic acid  Anemia of chronic disease- (present on admission) Hgb is actually better than baseline. No additional treatment at this time.  Hypertension- (present on admission) SBP 143 likely related to pain. Jonathan [name]  states he does not take medication on a regular basis  Plan No medical treatment at this time  Needs outpatient follow-up  Reactive airway disease- (present on admission) By history but not having any active symptoms  Plan - Continue home regimen       DVT prophylaxis: Lovenox Code Status: Full Code Family Communication: patient preferred no-one be called at this time  Disposition Plan: home when stable  Consults called: none  Admission status: Observation, Med-Surg   Illene Regulus, MD [facility] 10/12/2021, 12:42 AM

## 2021-10-12 NOTE — Assessment & Plan Note (Signed)
Hgb is actually better than baseline. No additional treatment at this time.

## 2021-10-12 NOTE — Assessment & Plan Note (Addendum)
Recurrent flare with pain. Denies chest pain, respiratory symtoms.  Plan obs admit to med-surg  PCA patient analgesia  Ketorolac 15 mg q6  Hydration  Continue hydroxyurea and folic acid

## 2021-10-12 NOTE — Assessment & Plan Note (Signed)
SBP 143 likely related to pain. Jonathan [name] states he does not take medication on a regular basis  Plan No medical treatment at this time  Needs outpatient follow-up

## 2021-10-12 NOTE — Assessment & Plan Note (Signed)
By history but not having any active symptoms  Plan - Continue home regimen

## 2021-10-12 NOTE — Subjective & Objective (Signed)
Jonathan [name] is a 21 y/o man with SSD with multiple crisis in the past. He was seen earlier 10/11/21 for SSD pain and was able to be discharged home. During the day his pain became much worse and he presented back to [facility] for evaluation and treatment

## 2021-10-13 DIAGNOSIS — D5700 Hb-SS disease with crisis, unspecified: Secondary | ICD-10-CM | POA: Diagnosis not present

## 2021-10-13 MED ORDER — NALOXONE HCL 0.4 MG/ML IJ SOLN: 0.4000 mg | INTRAMUSCULAR | Status: DC | PRN

## 2021-10-13 MED ORDER — SODIUM CHLORIDE 0.9% FLUSH: 9.0000 mL | INTRAVENOUS | Status: DC | PRN

## 2021-10-13 MED ORDER — HYDROMORPHONE 1 MG/ML IV SOLN
INTRAVENOUS | Status: DC
  Administered 2021-10-13: 1.5 mg via INTRAVENOUS
  Administered 2021-10-13: 2.5 mg via INTRAVENOUS
  Administered 2021-10-13 – 2021-10-14 (×3): 2 mg via INTRAVENOUS
  Administered 2021-10-14: 5 mg via INTRAVENOUS
  Administered 2021-10-14: 2 mg via INTRAVENOUS

## 2021-10-13 MED ORDER — ONDANSETRON HCL 4 MG/2ML IJ SOLN: 4.0000 mg | Freq: Four times a day (QID) | INTRAMUSCULAR | Status: DC | PRN

## 2021-10-13 MED ORDER — DIPHENHYDRAMINE HCL 25 MG PO CAPS: 25.0000 mg | ORAL_CAPSULE | ORAL | Status: DC | PRN

## 2021-10-13 NOTE — Progress Notes (Signed)
SICKLE CELL SERVICE PROGRESS NOTE  Jonathan [name] [id number] DOB: 01-Jan-2001 DOA: 10/11/2021 PCP: Tresa Garter, MD  Assessment/Plan: Principal Problem:   Sickle cell crisis ([place]) Active Problems:   Reactive airway disease   Hypertension   Anemia of chronic disease  Sickle cell pain crisis: Patient is having significant pain.  Currently on low-dose PCA.  We will therefore initiate higher dose PCA.  Continue to assess pain as necessary.  Continue Toradol and IV fluids. COPD with reactive airway disease: Stable at baseline.  Continue to monitor Chronic pain syndrome: On oral oxycodone only.  Continue Anemia of chronic disease: H&H remained stable. Essential hypertension: Blood pressure appears controlled.  Continue to monitor    Code Status: Full Family Communication: Girlfriend at bedside Disposition Plan: [place]  Pager [phone]. If 7PM-7AM, please contact night-coverage.  10/13/2021, 11:11 AM  LOS: 1 day   Brief narrative:  Jonathan [name] is a 21 y/o man with SSD with multiple crisis in the past. He was seen earlier 10/11/21 for SSD pain and was able to be discharged home. During the day his pain became much worse and he presented back to [facility] for evaluation and treatment     Consultants: None  Procedures: None  Antibiotics: None  HPI/Subjective: Patient still having 8 out of 10 pain.  Not relieved by current low-dose Dilaudid PCA regimen.  No fever or chills no nausea vomiting or diarrhea  Objective: Vitals:   10/12/21 1954 10/13/21 0025 10/13/21 0348 10/13/21 0952  BP:      Pulse:      Resp:   20 18  Temp:      TempSrc:      SpO2: 96% 95% 96% 94%  Weight:      Height:       Weight change:   Intake/Output Summary (Last 24 hours) at 10/13/2021 1111 Last data filed at 10/13/2021 0551 Gross per 24 hour  Intake 1623.04 ml  Output 1500 ml  Net 123.04 ml    General: Alert, awake, oriented x3, in no acute distress.  HEENT: [place]/AT PEERL,  EOMI Neck: Trachea midline,  no masses, no thyromegal,y no JVD, no carotid bruit OROPHARYNX:  Moist, No exudate/ erythema/lesions.  Heart: Regular rate and rhythm, without murmurs, rubs, gallops, PMI non-displaced, no heaves or thrills on palpation.  Lungs: Clear to auscultation, no wheezing or rhonchi noted. No increased vocal fremitus resonant to percussion  Abdomen: Soft, nontender, nondistended, positive bowel sounds, no masses no hepatosplenomegaly noted..  Neuro: No focal neurological deficits noted cranial nerves II through XII grossly intact. DTRs 2+ bilaterally upper and lower extremities. Strength 5 out of 5 in bilateral upper and lower extremities. Musculoskeletal: No warm swelling or erythema around joints, no spinal tenderness noted. Psychiatric: Patient alert and oriented x3, good insight and cognition, good recent to remote recall. Lymph node survey: No cervical axillary or inguinal lymphadenopathy noted.   Data Reviewed: Basic Metabolic Panel: Recent Labs  Lab 10/11/21 1531  NA 141  K 4.1  CL 109  CO2 25  GLUCOSE 103*  BUN 8  CREATININE 0.76  CALCIUM 9.5   Liver Function Tests: Recent Labs  Lab 10/11/21 1531  AST 19  ALT 11  ALKPHOS 120  BILITOT 1.0  PROT 8.8*  ALBUMIN 4.8   No results for input(s): LIPASE, AMYLASE in the last 168 hours. No results for input(s): AMMONIA in the last 168 hours. CBC: Recent Labs  Lab 10/11/21 1531  WBC 13.4*  NEUTROABS 8.4*  HGB 10.9*  HCT 31.9*  MCV 90.6  PLT 501*   Cardiac Enzymes: No results for input(s): CKTOTAL, CKMB, CKMBINDEX, TROPONINI in the last 168 hours. BNP (last 3 results) No results for input(s): BNP in the last 8760 hours.  ProBNP (last 3 results) No results for input(s): PROBNP in the last 8760 hours.  CBG: No results for input(s): GLUCAP in the last 168 hours.  Recent Results (from the past 240 hour(s))  Resp Panel by RT-PCR (Flu A&B, Covid) Nasopharyngeal Swab     Status: None   Collection  Time: 10/11/21 10:44 PM   Specimen: Nasopharyngeal Swab; Nasopharyngeal(NP) swabs in vial transport medium  Result Value Ref Range Status   SARS Coronavirus 2 by RT PCR NEGATIVE NEGATIVE Final    Comment: (NOTE) SARS-CoV-2 target nucleic acids are NOT DETECTED.  The SARS-CoV-2 RNA is generally detectable in upper respiratory specimens during the acute phase of infection. The lowest concentration of SARS-CoV-2 viral copies this assay can detect is 138 copies/mL. A negative result does not preclude SARS-Cov-2 infection and should not be used as the sole basis for treatment or other patient management decisions. A negative result may occur with  improper specimen collection/handling, submission of specimen other than nasopharyngeal swab, presence of viral mutation(s) within the areas targeted by this assay, and inadequate number of viral copies(<138 copies/mL). A negative result must be combined with clinical observations, patient history, and epidemiological information. The expected result is Negative.  Fact Sheet for Patients:  EntrepreneurPulse.com.au  Fact Sheet for Healthcare Providers:  IncredibleEmployment.be  This test is no t yet approved or cleared by the [place] FDA and  has been authorized for detection and/or diagnosis of SARS-CoV-2 by FDA under an Emergency Use Authorization (EUA). This EUA will remain  in effect (meaning this test can be used) for the duration of the COVID-19 declaration under Section 564(b)(1) of the Act, 21 U.S.C.section 360bbb-3(b)(1), unless the authorization is terminated  or revoked sooner.       Influenza A by PCR NEGATIVE NEGATIVE Final   Influenza B by PCR NEGATIVE NEGATIVE Final    Comment: (NOTE) The Xpert Xpress SARS-CoV-2/FLU/RSV plus assay is intended as an aid in the diagnosis of influenza from Nasopharyngeal swab specimens and should not be used as a sole basis for treatment. Nasal washings  and aspirates are unacceptable for Xpert Xpress SARS-CoV-2/FLU/RSV testing.  Fact Sheet for Patients: EntrepreneurPulse.com.au  Fact Sheet for Healthcare Providers: IncredibleEmployment.be  This test is not yet approved or cleared by the [place] FDA and has been authorized for detection and/or diagnosis of SARS-CoV-2 by FDA under an Emergency Use Authorization (EUA). This EUA will remain in effect (meaning this test can be used) for the duration of the COVID-19 declaration under Section 564(b)(1) of the Act, 21 U.S.C. section 360bbb-3(b)(1), unless the authorization is terminated or revoked.  Performed at [facility], [place] [address] [place], [place] [address]      Studies: DG Chest Portable 1 View  Result Date: 10/11/2021 CLINICAL DATA:  Shortness of breath.  Sickle cell crisis. EXAM: PORTABLE CHEST 1 VIEW COMPARISON:  07/27/2021 FINDINGS: Streaky opacity at the left base is similar to prior compatible with atelectasis or scarring. No edema or focal airspace consolidation. The cardiopericardial silhouette is within normal limits for size. IMPRESSION: Stable exam. Electronically Signed   By: Misty Stanley M.D.   On: 10/11/2021 14:37    Scheduled Meds:  enoxaparin (LOVENOX) injection  40 mg Subcutaneous [id number]   folic  acid  1 mg Oral Daily   HYDROmorphone   Intravenous Q4H   hydroxyurea  1,500 mg Oral Daily   ketorolac  15 mg Intravenous Q6H   senna-docusate  1 tablet Oral BID   Vitamin D (Ergocalciferol)  50,000 Units Oral Weekly   Continuous Infusions:  dextrose 5 % and 0.45% NaCl 125 mL/hr at 10/13/21 [phone]

## 2021-10-14 DIAGNOSIS — D5700 Hb-SS disease with crisis, unspecified: Secondary | ICD-10-CM | POA: Diagnosis not present

## 2021-10-14 LAB — CBC
HCT: 25.9 % — ABNORMAL LOW (ref 39.0–52.0)
Hemoglobin: 9.1 g/dL — ABNORMAL LOW (ref 13.0–17.0)
MCH: 31.7 pg (ref 26.0–34.0)
MCHC: 35.1 g/dL (ref 30.0–36.0)
MCV: 90.2 fL (ref 80.0–100.0)
Platelets: 436 10*3/uL — ABNORMAL HIGH (ref 150–400)
RBC: 2.87 MIL/uL — ABNORMAL LOW (ref 4.22–5.81)
RDW: 18.6 % — ABNORMAL HIGH (ref 11.5–15.5)
WBC: 16.9 10*3/uL — ABNORMAL HIGH (ref 4.0–10.5)
nRBC: 4.3 % — ABNORMAL HIGH (ref 0.0–0.2)

## 2021-10-14 MED ORDER — HYDROMORPHONE 1 MG/ML IV SOLN
INTRAVENOUS | Status: DC
  Administered 2021-10-14: 2 mg via INTRAVENOUS
  Administered 2021-10-14: 30 mg via INTRAVENOUS
  Administered 2021-10-15: 1 mg via INTRAVENOUS
  Administered 2021-10-15: 3 mg via INTRAVENOUS
  Filled 2021-10-14 (×2): qty 30

## 2021-10-14 MED ORDER — OXYCODONE HCL 5 MG PO TABS: 10.0000 mg | ORAL_TABLET | ORAL | Status: DC | PRN

## 2021-10-14 MED ORDER — OXYCODONE HCL 5 MG PO TABS
10.0000 mg | ORAL_TABLET | Freq: Once | ORAL | Status: AC
  Administered 2021-10-14: 10 mg via ORAL
  Filled 2021-10-14: qty 2

## 2021-10-14 NOTE — Progress Notes (Signed)
Subjective: Jonathan [name] is a 21 year old male with a medical history significant for sickle cell disease, chronic pain syndrome, opiate dependence and tolerance that was admitted for sickle cell pain crisis. Patient continues to complain of pain primarily to upper and lower extremities.  He rates his pain as 6/10.  Goal is 4/10.  He states that he is not ready for discharge at this time. He denies any shortness of breath, chest pain, urinary symptoms, nausea, vomiting, or diarrhea.  Objective:  Vital signs in last 24 hours:  Vitals:   10/14/21 0607 10/14/21 0618 10/14/21 0758 10/14/21 1240  BP:  (!) 152/95    Pulse:  (!) 110    Resp: 20 18 19 20   Temp:  99.6 F (37.6 C)    TempSrc:  Oral    SpO2: 97% 100% 100% 100%  Weight:      Height:        Intake/Output from previous day:   Intake/Output Summary (Last 24 hours) at 10/14/2021 1306 Last data filed at 10/14/2021 1035 Gross per 24 hour  Intake 894 ml  Output 2850 ml  Net -1956 ml    Physical Exam: General: Alert, awake, oriented x3, in no acute distress.  HEENT: [place]/AT PEERL, EOMI Neck: Trachea midline,  no masses, no thyromegal,y no JVD, no carotid bruit OROPHARYNX:  Moist, No exudate/ erythema/lesions.  Heart: Regular rate and rhythm, without murmurs, rubs, gallops, PMI non-displaced, no heaves or thrills on palpation.  Lungs: Clear to auscultation, no wheezing or rhonchi noted. No increased vocal fremitus resonant to percussion  Abdomen: Soft, nontender, nondistended, positive bowel sounds, no masses no hepatosplenomegaly noted..  Neuro: No focal neurological deficits noted cranial nerves II through XII grossly intact. DTRs 2+ bilaterally upper and lower extremities. Strength 5 out of 5 in bilateral upper and lower extremities. Musculoskeletal: No warm swelling or erythema around joints, no spinal tenderness noted. Psychiatric: Patient alert and oriented x3, good insight and cognition, good recent to remote  recall. Lymph node survey: No cervical axillary or inguinal lymphadenopathy noted.  Lab Results:  Basic Metabolic Panel:    Component Value Date/Time   NA 141 10/11/2021 1531   NA 139 08/06/2021 1030   K 4.1 10/11/2021 1531   CL 109 10/11/2021 1531   CO2 25 10/11/2021 1531   BUN 8 10/11/2021 1531   BUN 9 08/06/2021 1030   CREATININE 0.76 10/11/2021 1531   CREATININE 0.70 05/12/2016 1052   GLUCOSE 103 (H) 10/11/2021 1531   CALCIUM 9.5 10/11/2021 1531   CBC:    Component Value Date/Time   WBC 16.9 (H) 10/14/2021 0655   HGB 9.1 (L) 10/14/2021 0655   HGB 7.6 (L) 08/06/2021 1030   HCT 25.9 (L) 10/14/2021 0655   HCT 23.6 (L) 08/06/2021 1030   PLT 436 (H) 10/14/2021 0655   PLT 958 (HH) 08/06/2021 1030   MCV 90.2 10/14/2021 0655   MCV 96 08/06/2021 1030   NEUTROABS 8.4 (H) 10/11/2021 1531   NEUTROABS 3.4 08/06/2021 1030   LYMPHSABS 2.7 10/11/2021 1531   LYMPHSABS 2.4 08/06/2021 1030   MONOABS 0.8 10/11/2021 1531   EOSABS 0.1 10/11/2021 1531   EOSABS 0.2 08/06/2021 1030   BASOSABS 0.1 10/11/2021 1531   BASOSABS 0.1 08/06/2021 1030    Recent Results (from the past 240 hour(s))  Resp Panel by RT-PCR (Flu A&B, Covid) Nasopharyngeal Swab     Status: None   Collection Time: 10/11/21 10:44 PM   Specimen: Nasopharyngeal Swab; Nasopharyngeal(NP) swabs in vial transport medium  Result Value Ref Range Status   SARS Coronavirus 2 by RT PCR NEGATIVE NEGATIVE Final    Comment: (NOTE) SARS-CoV-2 target nucleic acids are NOT DETECTED.  The SARS-CoV-2 RNA is generally detectable in upper respiratory specimens during the acute phase of infection. The lowest concentration of SARS-CoV-2 viral copies this assay can detect is 138 copies/mL. A negative result does not preclude SARS-Cov-2 infection and should not be used as the sole basis for treatment or other patient management decisions. A negative result may occur with  improper specimen collection/handling, submission of specimen  other than nasopharyngeal swab, presence of viral mutation(s) within the areas targeted by this assay, and inadequate number of viral copies(<138 copies/mL). A negative result must be combined with clinical observations, patient history, and epidemiological information. The expected result is Negative.  Fact Sheet for Patients:  BloggerCourse.com  Fact Sheet for Healthcare Providers:  SeriousBroker.it  This test is no t yet approved or cleared by the [place] FDA and  has been authorized for detection and/or diagnosis of SARS-CoV-2 by FDA under an Emergency Use Authorization (EUA). This EUA will remain  in effect (meaning this test can be used) for the duration of the COVID-19 declaration under Section 564(b)(1) of the Act, 21 U.S.C.section 360bbb-3(b)(1), unless the authorization is terminated  or revoked sooner.       Influenza A by PCR NEGATIVE NEGATIVE Final   Influenza B by PCR NEGATIVE NEGATIVE Final    Comment: (NOTE) The Xpert Xpress SARS-CoV-2/FLU/RSV plus assay is intended as an aid in the diagnosis of influenza from Nasopharyngeal swab specimens and should not be used as a sole basis for treatment. Nasal washings and aspirates are unacceptable for Xpert Xpress SARS-CoV-2/FLU/RSV testing.  Fact Sheet for Patients: BloggerCourse.com  Fact Sheet for Healthcare Providers: SeriousBroker.it  This test is not yet approved or cleared by the [place] FDA and has been authorized for detection and/or diagnosis of SARS-CoV-2 by FDA under an Emergency Use Authorization (EUA). This EUA will remain in effect (meaning this test can be used) for the duration of the COVID-19 declaration under Section 564(b)(1) of the Act, 21 U.S.C. section 360bbb-3(b)(1), unless the authorization is terminated or revoked.  Performed at [facility], [address], [place], [place] [address]     Studies/Results: No results found.  Medications: Scheduled Meds:  enoxaparin (LOVENOX) injection  40 mg Subcutaneous Q24H   folic acid  1 mg Oral Daily   HYDROmorphone   Intravenous Q4H   hydroxyurea  1,500 mg Oral Daily   ketorolac  15 mg Intravenous Q6H   senna-docusate  1 tablet Oral BID   Vitamin D (Ergocalciferol)  50,000 Units Oral Weekly   Continuous Infusions:  dextrose 5 % and 0.45% NaCl 125 mL/hr at 10/14/21 1133   PRN Meds:.diphenhydrAMINE, naloxone **AND** sodium chloride flush, naloxone **AND** sodium chloride flush, ondansetron (ZOFRAN) IV, ondansetron (ZOFRAN) IV, oxyCODONE, polyethylene glycol  Consultants: none  Procedures: none  Antibiotics: none  Assessment/Plan: Principal Problem:   Sickle cell crisis (HCC) Active Problems:   Reactive airway disease   Hypertension   Anemia of chronic disease  Sickle cell disease with pain crisis: Continue IV Dilaudid PCA, settings decreased today Oxycodone 10 mg every 4 hours as needed for severe breakthrough pain Toradol 15 mg IV every 6 hours for total of 5 days Monitor vital signs very closely, reevaluate pain scale regularly, and supplemental oxygen as needed.  Chronic pain syndrome: Continue home medications  Anemia of chronic disease: Hemoglobin  is stable and consistent with patient's baseline.  There is no clinical indication for blood transfusion at this time.  Continue to follow closely.  Essential hypertension: Stable.  Continue to monitor closely.  Code Status: Full Code Family Communication: N/A Disposition Plan: Not yet ready for discharge.  Discharge planned for 10/15/2021 [name]  APRN, MSN, FNP-C Patient Care [facility] [address] [place], [place] [address] [phone]  If 5PM-8AM, please contact night-coverage.  10/14/2021, 1:06 PM  LOS: 2 days

## 2021-10-15 NOTE — Discharge Summary (Signed)
Physician Discharge Summary  Jonathan [name] [id number] DOB: 02-07-2001 DOA: 10/11/2021  PCP: Tresa Garter, MD  Admit date: 10/11/2021  Discharge date: 10/15/2021  Discharge Diagnoses:  Principal Problem:   Sickle cell crisis ([place]) Active Problems:   Reactive airway disease   Hypertension   Anemia of chronic disease   Discharge Condition: Stable  Disposition:   Follow-up Information     Tresa Garter, MD. Schedule an appointment as soon as possible for a visit in 1 week(s).   Specialty: Internal Medicine Contact information: [place] [address] [phone]         Kandra Nicolas, MD .   Specialty: Pediatrics Contact information: [place] [address] [phone]                Pt is discharged home in good condition and is to follow up with Tresa Garter, MD this week to have labs evaluated. Jonathan [name] is instructed to increase activity slowly and balance with rest for the next few days, and use prescribed medication to complete treatment of pain  Diet: Regular Wt Readings from Last 3 Encounters:  10/12/21 68 kg  10/11/21 68 kg  09/02/21 65.5 kg    History of present illness:  Jonathan [name] is a 21 y/o man with SSD with multiple crisis in the past. He was seen earlier 10/11/21 for SSD pain and was able to be discharged home. During the day his pain became much worse and he presented back to [facility] for evaluation and treatment    ED Course: Vitals stable with BP 143/95. LAb was unremarkable. IN ED he was given dose of ketorolac and had several doses of IV dilaudid. TRH called to admit for continue management of SSD crisis.  Hospital Course:  Patient was admitted for sickle cell pain crisis and managed appropriately with IVF, IV Dilaudid via PCA and IV Toradol, as well as other adjunct therapies per sickle cell pain management protocols.  Patient did well on above regimen.  Hemoglobin  remained stable throughout this admission, patient did not require any blood transfusion.  Patient also has hypertension which was appropriately controlled during this admission.  As of today, patient has returned to baseline, patient is ambulating well with no significant pain and he is able to tolerate p.o. intake with no restrictions. Patient was therefore discharged home today in a hemodynamically stable condition.   Jonathan [name] will follow-up with PCP within 1 week of this discharge. Jonathan [name] extensively about nonpharmacologic means of pain management, patient verbalized understanding and was appreciative of  the care received during this admission.   We discussed the need for good hydration, monitoring of hydration status, avoidance of heat, cold, stress, and infection triggers. We discussed the need to be adherent with taking Hydrea and other home medications. Patient was reminded of the need to seek medical attention immediately if any symptom of bleeding, anemia, or infection occurs.  Discharge Exam: Vitals:   10/15/21 0404 10/15/21 0842  BP:    Pulse:    Resp: 17 16  Temp:    SpO2: 98% 98%   Vitals:   10/15/21 0032 10/15/21 0132 10/15/21 0404 10/15/21 0842  BP:  128/78    Pulse:  78    Resp: 14 18 17 16   Temp:  98.5 F (36.9 C)    TempSrc:  Oral    SpO2: 98% 99% 98% 98%  Weight:      Height:  General appearance : Awake, alert, not in any distress. Speech Clear. Not toxic looking HEENT: Atraumatic and Normocephalic, pupils equally reactive to light and accomodation Neck: Supple, no JVD. No cervical lymphadenopathy.  Chest: Good air entry bilaterally, no added sounds  CVS: S1 S2 regular, no murmurs.  Abdomen: Bowel sounds present, Non tender and not distended with no gaurding, rigidity or rebound. Extremities: B/L Lower Ext shows no edema, both legs are warm to touch Neurology: Awake alert, and oriented X 3, CN II-XII intact, Non focal Skin: No  Rash  Discharge Instructions  Discharge Instructions     Diet - low sodium heart healthy   Complete by: As directed    Increase activity slowly   Complete by: As directed       Allergies as of 10/15/2021       Reactions   Allergy Medication [diphenhydramine] Swelling   Face swells up        Medication List     TAKE these medications    ergocalciferol 1.25 MG (50000 UT) capsule Commonly known as: Drisdol Take 1 capsule (50,000 Units total) by mouth once a week. What changed: additional instructions   folic acid 1 MG tablet Commonly known as: FOLVITE TAKE 1 TABLET(1 MG) BY MOUTH DAILY What changed: See the new instructions.   hydroxyurea 500 MG capsule Commonly known as: HYDREA Take 3 capsules (1,500 mg total) by mouth daily.   ibuprofen 800 MG tablet Commonly known as: ADVIL Take 1 tablet (800 mg total) by mouth every 6 (six) hours as needed for moderate pain.   Oxycodone HCl 10 MG Tabs Take 1 tablet (10 mg total) by mouth every 4 (four) hours as needed (pain).        The results of significant diagnostics from this hospitalization (including imaging, microbiology, ancillary and laboratory) are listed below for reference.    Significant Diagnostic Studies: DG Chest Portable 1 View  Result Date: 10/11/2021 CLINICAL DATA:  Shortness of breath.  Sickle cell crisis. EXAM: PORTABLE CHEST 1 VIEW COMPARISON:  07/27/2021 FINDINGS: Streaky opacity at the left base is similar to prior compatible with atelectasis or scarring. No edema or focal airspace consolidation. The cardiopericardial silhouette is within normal limits for size. IMPRESSION: Stable exam. Electronically Signed   By: Kennith Center M.D.   On: 10/11/2021 14:37    Microbiology: Recent Results (from the past 240 hour(s))  Resp Panel by RT-PCR (Flu A&B, Covid) Nasopharyngeal Swab     Status: None   Collection Time: 10/11/21 10:44 PM   Specimen: Nasopharyngeal Swab; Nasopharyngeal(NP) swabs in vial  transport medium  Result Value Ref Range Status   SARS Coronavirus 2 by RT PCR NEGATIVE NEGATIVE Final    Comment: (NOTE) SARS-CoV-2 target nucleic acids are NOT DETECTED.  The SARS-CoV-2 RNA is generally detectable in upper respiratory specimens during the acute phase of infection. The lowest concentration of SARS-CoV-2 viral copies this assay can detect is 138 copies/mL. A negative result does not preclude SARS-Cov-2 infection and should not be used as the sole basis for treatment or other patient management decisions. A negative result may occur with  improper specimen collection/handling, submission of specimen other than nasopharyngeal swab, presence of viral mutation(s) within the areas targeted by this assay, and inadequate number of viral copies(<138 copies/mL). A negative result must be combined with clinical observations, patient history, and epidemiological information. The expected result is Negative.  Fact Sheet for Patients:  BloggerCourse.com  Fact Sheet for Healthcare Providers:  SeriousBroker.it  This test  is no Jonathan yet approved or cleared by the [place] and  has been authorized for detection and/or diagnosis of SARS-CoV-2 by FDA under an Emergency Use Authorization (EUA). This EUA will remain  in effect (meaning this test can be used) for the duration of the COVID-19 declaration under Section 564(b)(1) of the Act, 21 U.S.C.section 360bbb-3(b)(1), unless the authorization is terminated  or revoked sooner.       Influenza A by PCR NEGATIVE NEGATIVE Final   Influenza B by PCR NEGATIVE NEGATIVE Final    Comment: (NOTE) The Xpert Xpress SARS-CoV-2/FLU/RSV plus assay is intended as an aid in the diagnosis of influenza from Nasopharyngeal swab specimens and should not be used as a sole basis for treatment. Nasal washings and aspirates are unacceptable for Xpert Xpress SARS-CoV-2/FLU/RSV testing.  Fact  Sheet for Patients: EntrepreneurPulse.com.au  Fact Sheet for Healthcare Providers: IncredibleEmployment.be  This test is not yet approved or cleared by the [place] FDA and has been authorized for detection and/or diagnosis of SARS-CoV-2 by FDA under an Emergency Use Authorization (EUA). This EUA will remain in effect (meaning this test can be used) for the duration of the COVID-19 declaration under Section 564(b)(1) of the Act, 21 U.S.C. section 360bbb-3(b)(1), unless the authorization is terminated or revoked.  Performed at [facility], [place] [address], [place], [place] [address]      Labs: Basic Metabolic Panel: Recent Labs  Lab 10/11/21 1531  NA 141  K 4.1  CL 109  CO2 25  GLUCOSE 103*  BUN 8  CREATININE 0.76  CALCIUM 9.5   Liver Function Tests: Recent Labs  Lab 10/11/21 1531  AST 19  ALT 11  ALKPHOS 120  BILITOT 1.0  PROT 8.8*  ALBUMIN 4.8   No results for input(s): LIPASE, AMYLASE in the last 168 hours. No results for input(s): AMMONIA in the last 168 hours. CBC: Recent Labs  Lab 10/11/21 1531 10/14/21 0655  WBC 13.4* 16.9*  NEUTROABS 8.4*  --   HGB 10.9* 9.1*  HCT 31.9* 25.9*  MCV 90.6 90.2  PLT 501* 436*   Cardiac Enzymes: No results for input(s): CKTOTAL, CKMB, CKMBINDEX, TROPONINI in the last 168 hours. BNP: Invalid input(s): POCBNP CBG: No results for input(s): GLUCAP in the last 168 hours.  Time coordinating discharge: 50 minutes  Signed:  [facility] 10/15/2021, 10:31 AM

## 2021-10-15 NOTE — Progress Notes (Signed)
Nursing received a call regarding request for continuation of care to [facility] for a sickle cell program but unfortunately, he is over the age limit of 37 for that program and they are unable to see him. I have also sent a message to [facility] team.

## 2021-10-15 NOTE — TOC Initial Note (Signed)
Transition of Care [facility]) - Initial/Assessment Note    Patient Details  Name: Jonathan [name] MRN: [id number] Date of Birth: [birth year]   Transition of Care [facility]) Screening Note   Patient Details  Name: Jonathan [name] Date of Birth: [birth year]   Transition of Care [facility]) CM/SW Contact:    Joneisha Miles, Marjie Skiff, RN Phone Number: 10/15/2021, 10:30 AM    Transition of Care Department [facility]) has reviewed patient and no TOC needs have been identified at this time. We will continue to monitor patient advancement through interdisciplinary progression rounds. If new patient transition needs arise, please place a TOC consult.   Activities of Daily Living Home Assistive Devices/Equipment: None ADL Screening (condition at time of admission) Patient's cognitive ability adequate to safely complete daily activities?: Yes Is the patient deaf or have difficulty hearing?: No Does the patient have difficulty seeing, even when wearing glasses/contacts?: No Does the patient have difficulty concentrating, remembering, or making decisions?: No Patient able to express need for assistance with ADLs?: Yes Does the patient have difficulty dressing or bathing?: No Independently performs ADLs?: Yes (appropriate for developmental age) Does the patient have difficulty walking or climbing stairs?: No Weakness of Legs: Both Weakness of Arms/Hands: Both         Admission diagnosis:  Sickle cell crisis ([place]) [D57.00] Sickle cell pain crisis ([place]) [D57.00] Patient Active Problem List   Diagnosis Date Noted   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[place]) 09/02/2021   Sickle cell crisis ([place]) 07/27/2021   COVID-19 virus infection 07/27/2021   Anemia of chronic disease 06/27/2021   Thrombocytosis 06/27/2021   Mild intermittent asthma without complication [id number]   Sickle cell disease with crisis ([place]) 10/23/2020   Sickle cell pain crisis ([place]) 08/16/2020   Proteinuria 04/14/2020   Influenza A 10/13/2017    Sickle cell disease ([place]) 04/27/2017   Failed hearing screening 03/25/2016   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   Myopic astigmatism 03/16/2015   Avascular necrosis of bone of left hip ([place]) 03/24/2013   Chronic obstruct airways disease ([place]) 03/15/2013   Snoring 04/08/2012   Hypertension 11/11/2011   Reactive airway disease 11/09/2011   PCP:  Tresa Garter, MD Pharmacy:   [facility] [place], [address] [place] [address] Phone: [phone] Fax: [phone]     Social Determinants of Health (SDOH) Interventions    Readmission Risk Interventions Readmission Risk Prevention Plan 10/15/2021  Transportation Screening Complete  PCP or Specialist Appt within 5-7 Days Complete  Home Care Screening Complete  Medication Review (RN CM) Complete  Some recent data might be hidden

## 2021-10-22 ENCOUNTER — Other Ambulatory Visit: Payer: Self-pay | Admitting: Family Medicine

## 2021-10-22 ENCOUNTER — Other Ambulatory Visit: Payer: Self-pay

## 2021-10-22 ENCOUNTER — Non-Acute Institutional Stay (HOSPITAL_COMMUNITY)
Admission: RE | Admit: 2021-10-22 | Discharge: 2021-10-22 | Disposition: A | Discharge status: Home / Self Care | Payer: Commercial Managed Care - PPO | Source: Ambulatory Visit | Attending: Internal Medicine | Admitting: Internal Medicine

## 2021-10-22 ENCOUNTER — Telehealth (HOSPITAL_COMMUNITY): Payer: Self-pay

## 2021-10-22 DIAGNOSIS — D571 Sickle-cell disease without crisis: Secondary | ICD-10-CM

## 2021-10-22 DIAGNOSIS — D5700 Hb-SS disease with crisis, unspecified: Secondary | ICD-10-CM | POA: Diagnosis not present

## 2021-10-22 LAB — COMPREHENSIVE METABOLIC PANEL
ALT: 9 U/L (ref 0–44)
AST: 29 U/L (ref 15–41)
Albumin: 3.8 g/dL (ref 3.5–5.0)
Alkaline Phosphatase: 119 U/L (ref 38–126)
Anion gap: 8 (ref 5–15)
BUN: 6 mg/dL (ref 6–20)
CO2: 25 mmol/L (ref 22–32)
Calcium: 9.4 mg/dL (ref 8.9–10.3)
Chloride: 104 mmol/L (ref 98–111)
Creatinine, Ser: 0.83 mg/dL (ref 0.61–1.24)
GFR, Estimated: >60 mL/min (ref >60)
Glucose, Bld: 100 mg/dL — ABNORMAL HIGH (ref 70–99)
Potassium: 4.8 mmol/L (ref 3.5–5.1)
Sodium: 137 mmol/L (ref 135–145)
Total Bilirubin: 0.9 mg/dL (ref 0.3–1.2)
Total Protein: 8.2 g/dL — ABNORMAL HIGH (ref 6.5–8.1)

## 2021-10-22 LAB — CBC WITH DIFFERENTIAL/PLATELET
Abs Immature Granulocytes: 0.03 10*3/uL (ref 0.00–0.07)
Basophils Absolute: 0.1 10*3/uL (ref 0.0–0.1)
Basophils Relative: 1 %
Eosinophils Absolute: 0.1 10*3/uL (ref 0.0–0.5)
Eosinophils Relative: 1 %
HCT: 23.2 % — ABNORMAL LOW (ref 39.0–52.0)
Hemoglobin: 7.9 g/dL — ABNORMAL LOW (ref 13.0–17.0)
Immature Granulocytes: 0 %
Lymphocytes Relative: 39 %
Lymphs Abs: 3.3 10*3/uL (ref 0.7–4.0)
MCH: 31.6 pg (ref 26.0–34.0)
MCHC: 34.1 g/dL (ref 30.0–36.0)
MCV: 92.8 fL (ref 80.0–100.0)
Monocytes Absolute: 0.8 10*3/uL (ref 0.1–1.0)
Monocytes Relative: 9 %
Neutro Abs: 4.2 10*3/uL (ref 1.7–7.7)
Neutrophils Relative %: 50 %
Platelets: 774 10*3/uL — ABNORMAL HIGH (ref 150–400)
RBC: 2.5 MIL/uL — ABNORMAL LOW (ref 4.22–5.81)
RDW: 18.5 % — ABNORMAL HIGH (ref 11.5–15.5)
WBC: 8.4 10*3/uL (ref 4.0–10.5)
nRBC: 2.8 % — ABNORMAL HIGH (ref 0.0–0.2)

## 2021-10-22 MED ORDER — FOLIC ACID 1 MG PO TABS: 1.0000 mg | ORAL_TABLET | Freq: Every day | ORAL | 3 refills | Status: DC

## 2021-10-22 MED ORDER — SODIUM CHLORIDE 0.45 % IV BOLUS
500.0000 mL | Freq: Once | INTRAVENOUS | Status: AC
  Administered 2021-10-22: 500 mL via INTRAVENOUS

## 2021-10-22 NOTE — Telephone Encounter (Signed)
Patient called in. Complains of no pain, rates 0/10 , however states he has been feeling tired and lethargic since he was discharged from hospital last Friday. Pt states he was having trouble catching his breath after discharge but symptom has since resolved. Pt concerned he needs a blood transfusion.  Denied chest pain, abd pain, fever, N/V/D. Wants to come in for treatment. Last took his pain medications 2-3 days ago, states he does need to take them at this time since he is not having sickle cell related pain. Pt denies recent visits to the ED. Pt states his Mother is his transportation. Pt denies exposure to anyone covid positive in the last two weeks, denies flu like symptoms today. [place] FNP notified, pt can be seen at the [facility] [place] for IVF infusion and labwork only, pt will not be admitted to the day hospital. Pt made aware and instructed to come to center at 11:30am today, verbalized understanding

## 2021-10-22 NOTE — Progress Notes (Signed)
Will discontinue hydroxyurea. Start a trial of Adakveo, monthly injection.   [name]  APRN, MSN, FNP-C [facility] [place] [address] [place], [place] [address] [phone]

## 2021-10-22 NOTE — Progress Notes (Signed)
PATIENT CARE CENTER NOTE    Provider: [place] Hollis FNP     Procedure: IV fluid hydration +  CBC w diff, CMP     Note:  Patient received 500cc 1/2 NS over 1 hour via PIV.  Labs collected per orders. Patient tolerated infusions well. Hgb 7.9 reported to provider. Vital signs stable. AVS offered but patient refused. Pt to RTC on 10/23/2021 to receive Adakveo infusion, pt made aware, verbalized understanding.  Patient alert, oriented and ambulatory at discharge.

## 2021-10-23 ENCOUNTER — Non-Acute Institutional Stay (HOSPITAL_COMMUNITY)
Admission: RE | Admit: 2021-10-23 | Discharge: 2021-10-23 | Disposition: A | Discharge status: Home / Self Care | Payer: Commercial Managed Care - PPO | Source: Ambulatory Visit | Attending: Internal Medicine | Admitting: Internal Medicine

## 2021-10-23 ENCOUNTER — Other Ambulatory Visit: Payer: Self-pay | Admitting: Family Medicine

## 2021-10-23 DIAGNOSIS — D571 Sickle-cell disease without crisis: Secondary | ICD-10-CM | POA: Diagnosis present

## 2021-10-23 MED ORDER — SODIUM CHLORIDE 0.9 % IV SOLN: INTRAVENOUS | Status: DC | PRN

## 2021-10-23 MED ORDER — SODIUM CHLORIDE 0.9 % IV SOLN
5.0000 mg/kg | Freq: Once | INTRAVENOUS | Status: AC
  Administered 2021-10-23: 326 mg via INTRAVENOUS
  Filled 2021-10-23: qty 32.6

## 2021-10-23 NOTE — Progress Notes (Signed)
PATIENT CARE CENTER NOTE ?  ?  ?Diagnosis: Sickle cell anemia  ?  ?  ?Provider: Hollis, [place], FNP  ?  ?  ?Procedure: Adakveo infusion ?  ?  ?Note:  Patient received Adakveo infusion initial  dose (1 of 2) via PIV. Tolerated infusion well. 25cc NS flush given after infusion completed per protocol.  Observed patient for 30 minutes post infusion, no s/s reaction noted.  Patient to come back in 2 weeks for loading dose. Pt scheduled on 11/06/2021 to receive next dose, made aware and verbalized understanding.  Provided patient with AVS. Patient alert, oriented and ambulatory at discharge.    ?

## 2021-10-31 ENCOUNTER — Ambulatory Visit: Payer: Commercial Managed Care - PPO | Admitting: Internal Medicine

## 2021-11-06 ENCOUNTER — Other Ambulatory Visit: Payer: Self-pay

## 2021-11-06 ENCOUNTER — Other Ambulatory Visit: Payer: Self-pay | Admitting: Family Medicine

## 2021-11-06 ENCOUNTER — Non-Acute Institutional Stay (HOSPITAL_COMMUNITY)
Admission: RE | Admit: 2021-11-06 | Discharge: 2021-11-06 | Disposition: A | Discharge status: Home / Self Care | Payer: Commercial Managed Care - PPO | Source: Ambulatory Visit | Attending: Internal Medicine | Admitting: Internal Medicine

## 2021-11-06 DIAGNOSIS — D571 Sickle-cell disease without crisis: Secondary | ICD-10-CM

## 2021-11-06 LAB — CBC WITH DIFFERENTIAL/PLATELET
Abs Immature Granulocytes: 0.01 10*3/uL (ref 0.00–0.07)
Basophils Absolute: 0.2 10*3/uL — ABNORMAL HIGH (ref 0.0–0.1)
Basophils Relative: 2 %
Eosinophils Absolute: 0.6 10*3/uL — ABNORMAL HIGH (ref 0.0–0.5)
Eosinophils Relative: 9 %
HCT: 32 % — ABNORMAL LOW (ref 39.0–52.0)
Hemoglobin: 10.7 g/dL — ABNORMAL LOW (ref 13.0–17.0)
Immature Granulocytes: 0 %
Lymphocytes Relative: 39 %
Lymphs Abs: 2.7 10*3/uL (ref 0.7–4.0)
MCH: 31.9 pg (ref 26.0–34.0)
MCHC: 33.4 g/dL (ref 30.0–36.0)
MCV: 95.5 fL (ref 80.0–100.0)
Monocytes Absolute: 0.8 10*3/uL (ref 0.1–1.0)
Monocytes Relative: 12 %
Neutro Abs: 2.6 10*3/uL (ref 1.7–7.7)
Neutrophils Relative %: 38 %
Platelets: 418 10*3/uL — ABNORMAL HIGH (ref 150–400)
RBC: 3.35 MIL/uL — ABNORMAL LOW (ref 4.22–5.81)
RDW: 19.9 % — ABNORMAL HIGH (ref 11.5–15.5)
WBC: 6.8 10*3/uL (ref 4.0–10.5)
nRBC: 1.3 % — ABNORMAL HIGH (ref 0.0–0.2)

## 2021-11-06 MED ORDER — SODIUM CHLORIDE 0.9 % IV SOLN: INTRAVENOUS | Status: DC | PRN

## 2021-11-06 MED ORDER — SODIUM CHLORIDE 0.9 % IV SOLN
5.0000 mg/kg | Freq: Once | INTRAVENOUS | Status: AC
  Administered 2021-11-06: 326 mg via INTRAVENOUS
  Filled 2021-11-06: qty 32.6

## 2021-11-06 NOTE — Progress Notes (Signed)
PATIENT CARE CENTER NOTE ? ? 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 ? ?Provider: Hollis, [place], [place] ? ? ?Procedure: Adakveo infusion  ? ? ?Note:  Patient received Adakveo infusion loading  dose (2 of 2) via PIV. Patient tolerated infusion well with no adverse reaction. Flushed line with 25 cc NS after infusion completed per protocol. Observed the patient for 15 minutes post infusion. Vital signs stable.  AVS offered but patient refused. Patient advised to scheduled next appointment for 1 month.  Patient alert, oriented and ambulatory at discharge.    ?

## 2021-11-11 ENCOUNTER — Telehealth: Payer: Self-pay

## 2021-11-11 NOTE — Telephone Encounter (Signed)
Oxycodone  Ibuprofen  

## 2021-12-01 ENCOUNTER — Emergency Department (HOSPITAL_COMMUNITY)
Admission: EM | Admit: 2021-12-01 | Discharge: 2021-12-01 | Disposition: A | Discharge status: Home / Self Care | Payer: Commercial Managed Care - PPO | Attending: Emergency Medicine | Admitting: Emergency Medicine

## 2021-12-01 ENCOUNTER — Encounter (HOSPITAL_COMMUNITY): Payer: Self-pay

## 2021-12-01 DIAGNOSIS — D72829 Elevated white blood cell count, unspecified: Secondary | ICD-10-CM | POA: Insufficient documentation

## 2021-12-01 DIAGNOSIS — D5700 Hb-SS disease with crisis, unspecified: Secondary | ICD-10-CM

## 2021-12-01 DIAGNOSIS — J45909 Unspecified asthma, uncomplicated: Secondary | ICD-10-CM | POA: Diagnosis not present

## 2021-12-01 DIAGNOSIS — M545 Low back pain, unspecified: Secondary | ICD-10-CM | POA: Diagnosis present

## 2021-12-01 DIAGNOSIS — D57219 Sickle-cell/Hb-C disease with crisis, unspecified: Secondary | ICD-10-CM | POA: Diagnosis not present

## 2021-12-01 LAB — CBC WITH DIFFERENTIAL/PLATELET
Abs Immature Granulocytes: 1.26 10*3/uL — ABNORMAL HIGH (ref 0.00–0.07)
Basophils Absolute: 0.2 10*3/uL — ABNORMAL HIGH (ref 0.0–0.1)
Basophils Relative: 1 %
Eosinophils Absolute: 0.1 10*3/uL (ref 0.0–0.5)
Eosinophils Relative: 0 %
HCT: 33.6 % — ABNORMAL LOW (ref 39.0–52.0)
Hemoglobin: 11.5 g/dL — ABNORMAL LOW (ref 13.0–17.0)
Immature Granulocytes: 10 %
Lymphocytes Relative: 20 %
Lymphs Abs: 2.6 10*3/uL (ref 0.7–4.0)
MCH: 31.2 pg (ref 26.0–34.0)
MCHC: 34.2 g/dL (ref 30.0–36.0)
MCV: 91.1 fL (ref 80.0–100.0)
Monocytes Absolute: 1 10*3/uL (ref 0.1–1.0)
Monocytes Relative: 8 %
Neutro Abs: 8.1 10*3/uL — ABNORMAL HIGH (ref 1.7–7.7)
Neutrophils Relative %: 61 %
Platelets: 402 10*3/uL — ABNORMAL HIGH (ref 150–400)
RBC: 3.69 MIL/uL — ABNORMAL LOW (ref 4.22–5.81)
RDW: 19.6 % — ABNORMAL HIGH (ref 11.5–15.5)
WBC: 13.1 10*3/uL — ABNORMAL HIGH (ref 4.0–10.5)
nRBC: 4.3 % — ABNORMAL HIGH (ref 0.0–0.2)

## 2021-12-01 LAB — BASIC METABOLIC PANEL
Anion gap: 6 (ref 5–15)
BUN: 6 mg/dL (ref 6–20)
CO2: 28 mmol/L (ref 22–32)
Calcium: 9.7 mg/dL (ref 8.9–10.3)
Chloride: 106 mmol/L (ref 98–111)
Creatinine, Ser: 0.74 mg/dL (ref 0.61–1.24)
GFR, Estimated: >60 mL/min (ref >60)
Glucose, Bld: 128 mg/dL — ABNORMAL HIGH (ref 70–99)
Potassium: 3.9 mmol/L (ref 3.5–5.1)
Sodium: 140 mmol/L (ref 135–145)

## 2021-12-01 LAB — RETICULOCYTES
Immature Retic Fract: 37.6 % — ABNORMAL HIGH (ref 2.3–15.9)
RBC.: 3.68 MIL/uL — ABNORMAL LOW (ref 4.22–5.81)
Retic Count, Absolute: 282 10*3/uL — ABNORMAL HIGH (ref 19.0–186.0)
Retic Ct Pct: 7.5 % — ABNORMAL HIGH (ref 0.4–3.1)

## 2021-12-01 MED ORDER — HYDROMORPHONE HCL 2 MG/ML IJ SOLN
2.0000 mg | Freq: Once | INTRAMUSCULAR | Status: AC
  Administered 2021-12-01: 2 mg via INTRAVENOUS
  Filled 2021-12-01: qty 1

## 2021-12-01 MED ORDER — HYDROMORPHONE HCL 2 MG/ML IJ SOLN
2.0000 mg | INTRAMUSCULAR | Status: AC
  Administered 2021-12-01: 2 mg via INTRAVENOUS
  Filled 2021-12-01: qty 1

## 2021-12-01 MED ORDER — DEXTROSE-NACL 5-0.45 % IV SOLN: INTRAVENOUS | Status: DC

## 2021-12-01 MED ORDER — ONDANSETRON 8 MG PO TBDP: 8.0000 mg | ORAL_TABLET | Freq: Three times a day (TID) | ORAL | Status: DC | PRN

## 2021-12-01 MED ORDER — ONDANSETRON HCL 4 MG/2ML IJ SOLN
4.0000 mg | Freq: Once | INTRAMUSCULAR | Status: AC
  Administered 2021-12-01: 4 mg via INTRAVENOUS
  Filled 2021-12-01: qty 2

## 2021-12-01 MED ORDER — HYDROMORPHONE HCL 2 MG/ML IJ SOLN
2.0000 mg | INTRAMUSCULAR | Status: AC
  Filled 2021-12-01: qty 1

## 2021-12-01 MED ORDER — KETOROLAC TROMETHAMINE 15 MG/ML IJ SOLN
15.0000 mg | INTRAMUSCULAR | Status: AC
  Administered 2021-12-01: 15 mg via INTRAVENOUS
  Filled 2021-12-01: qty 1

## 2021-12-01 NOTE — Discharge Instructions (Signed)
Follow-up with sickle cell clinic as needed follow-up with your doctors as needed. ?

## 2021-12-01 NOTE — ED Provider Notes (Addendum)
?[facility] DEPT ?Provider Note ? ? ?CSN: [id number] ?Arrival date & time: 12/01/21  1559 ? ?  ? ?History ? ?Chief Complaint  ?Patient presents with  ? Sickle Cell Pain Crisis  ? ? ?Jonathan [name] is a 21 y.o. male. ? ?Patient with no history of sickle cell disease.  Patient was feeling fine yesterday.  Awoke this morning with pain in the lower back and pain in both lower extremities.  No fever no upper respiratory symptoms.  No nausea no vomiting.  Patient states typical for sickle cell pain.  He tried to use his oral medication without help. ? ?Past medical history significant for sickle cell disease asthma avascular necrosis of left hip. ? ? ?  ? ?Home Medications ?Prior to Admission medications   ?Medication Sig Start Date End Date Taking? Authorizing Provider  ?ergocalciferol (DRISDOL) 1.25 MG (50000 UT) capsule Take 1 capsule (50,000 Units total) by mouth once a week. ?Patient taking differently: Take 50,000 Units by mouth once a week. Sunday's 04/12/20   Vevelyn Francois, NP  ?folic acid (FOLVITE) 1 MG tablet Take 1 tablet (1 mg total) by mouth daily. TAKE 1 TABLET BY MOUTH DAILY Strength 1 mg 10/22/21   Dorena Dew, FNP  ?ibuprofen (ADVIL) 800 MG tablet Take 1 tablet (800 mg total) by mouth every 6 (six) hours as needed for moderate pain. 10/02/21   Dorena Dew, FNP  ?Oxycodone HCl 10 MG TABS Take 1 tablet (10 mg total) by mouth every 4 (four) hours as needed (pain). 10/02/21   Dorena Dew, FNP  ?   ? ?Allergies    ?Allergy medication [diphenhydramine]   ? ?Review of Systems   ?Review of Systems  ?Constitutional:  Negative for chills and fever.  ?HENT:  Negative for ear pain and sore throat.   ?Eyes:  Negative for pain and visual disturbance.  ?Respiratory:  Negative for cough and shortness of breath.   ?Cardiovascular:  Negative for chest pain and palpitations.  ?Gastrointestinal:  Negative for abdominal pain and vomiting.  ?Genitourinary:  Negative for dysuria and  hematuria.  ?Musculoskeletal:  Positive for back pain and myalgias. Negative for arthralgias.  ?Skin:  Negative for color change and rash.  ?Neurological:  Negative for seizures and syncope.  ?All other systems reviewed and are negative. ? ?Physical Exam ?Updated Vital Signs ?BP (!) 167/110 (BP Location: Left Arm)   Pulse 94   Temp 98.5 ?F (36.9 ?C) (Oral)   Resp (!) 22   SpO2 95%  ?Physical Exam ?Vitals and nursing note reviewed.  ?Constitutional:   ?   General: He is not in acute distress. ?   Appearance: Normal appearance. He is well-developed.  ?HENT:  ?   Head: Normocephalic and atraumatic.  ?Eyes:  ?   Extraocular Movements: Extraocular movements intact.  ?   Conjunctiva/sclera: Conjunctivae normal.  ?   Pupils: Pupils are equal, round, and reactive to light.  ?Cardiovascular:  ?   Rate and Rhythm: Normal rate and regular rhythm.  ?   Heart sounds: No murmur heard. ?Pulmonary:  ?   Effort: Pulmonary effort is normal. No respiratory distress.  ?   Breath sounds: Normal breath sounds. No wheezing or rales.  ?Abdominal:  ?   Palpations: Abdomen is soft.  ?   Tenderness: There is no abdominal tenderness.  ?Musculoskeletal:     ?   General: No swelling.  ?   Cervical back: Normal range of motion and neck supple.  ?  Skin: ?   General: Skin is warm and dry.  ?   Capillary Refill: Capillary refill takes less than 2 seconds.  ?Neurological:  ?   General: No focal deficit present.  ?   Mental Status: He is alert and oriented to person, place, and time.  ?   Cranial Nerves: No cranial nerve deficit.  ?   Sensory: No sensory deficit.  ?   Motor: No weakness.  ?Psychiatric:     ?   Mood and Affect: Mood normal.  ? ? ?ED Results / Procedures / Treatments   ?Labs ?(all labs ordered are listed, but only abnormal results are displayed) ?Labs Reviewed  ?RETICULOCYTES  ?BASIC METABOLIC PANEL  ?CBC WITH DIFFERENTIAL/PLATELET  ? ? ?EKG ?None ? ?Radiology ?No results found. ? ?Procedures ?Procedures  ? ? ?Medications Ordered in  ED ?Medications  ?dextrose 5 %-0.45 % sodium chloride infusion (has no administration in time range)  ?ketorolac (TORADOL) 15 MG/ML injection 15 mg (has no administration in time range)  ?HYDROmorphone (DILAUDID) injection 2 mg (has no administration in time range)  ?HYDROmorphone (DILAUDID) injection 2 mg (has no administration in time range)  ?HYDROmorphone (DILAUDID) injection 2 mg (has no administration in time range)  ?ondansetron (ZOFRAN-ODT) disintegrating tablet 8 mg (has no administration in time range)  ? ? ?ED Course/ Medical Decision Making/ A&P ?  ?                        ?Medical Decision Making ?Amount and/or Complexity of Data Reviewed ?Labs: ordered. ? ?Risk ?Prescription drug management. ? ?CRITICAL CARE ?Performed by: Fredia Sorrow ?Total critical care time: 35 minutes ?Critical care time was exclusive of separately billable procedures and treating other patients. ?Critical care was necessary to treat or prevent imminent or life-threatening deterioration. ?Critical care was time spent personally by me on the following activities: development of treatment plan with patient and/or surrogate as well as nursing, discussions with consultants, evaluation of patient's response to treatment, examination of patient, obtaining history from patient or surrogate, ordering and performing treatments and interventions, ordering and review of laboratory studies, ordering and review of radiographic studies, pulse oximetry and re-evaluation of patient's condition. ? ?Patient with known history of sickle cell disease.  Presenting with a typical sickle cell crisis pain for him.  Patient is followed by the sickle cell clinic as well. ? ?Will initiate sickle cell protocol.  Patient does have a allergy to Benadryl so that will be avoided in the protocol. ? ?Patient improving on the sickle cell protocol as far as pain control goes.  Patient's labs somewhat consistent with a sickle cell crisis retake count is 7.5.   Basic metabolic panel without significant abnormalities.  Little bit of leukocytosis white count 13,000 hemoglobin 11.5 which is very good for him.  Platelets 402. ? ?Patient is requesting 1 more dose of hydromorphone and Zofran and then most likely will be dischargeable and he can follow-up with the sickle cell clinic. ? ? ?Final Clinical Impression(s) / ED Diagnoses ?Final diagnoses:  ?Sickle cell pain crisis ([place])  ? ? ?Rx / DC Orders ?ED Discharge Orders   ? ? None  ? ?  ? ? ?  ?Fredia Sorrow, MD ?12/01/21 1644 ? ?  ?Fredia Sorrow, MD ?12/01/21 1954 ? ?

## 2021-12-02 ENCOUNTER — Emergency Department (HOSPITAL_COMMUNITY)
Admission: EM | Admit: 2021-12-02 | Discharge: 2021-12-02 | Disposition: A | Discharge status: Home / Self Care | Payer: Commercial Managed Care - PPO | Attending: Student | Admitting: Student

## 2021-12-02 ENCOUNTER — Encounter (HOSPITAL_COMMUNITY): Payer: Self-pay

## 2021-12-02 DIAGNOSIS — D5700 Hb-SS disease with crisis, unspecified: Secondary | ICD-10-CM | POA: Diagnosis present

## 2021-12-02 DIAGNOSIS — Z96642 Presence of left artificial hip joint: Secondary | ICD-10-CM | POA: Insufficient documentation

## 2021-12-02 DIAGNOSIS — D571 Sickle-cell disease without crisis: Secondary | ICD-10-CM

## 2021-12-02 DIAGNOSIS — I10 Essential (primary) hypertension: Secondary | ICD-10-CM | POA: Diagnosis not present

## 2021-12-02 DIAGNOSIS — J45909 Unspecified asthma, uncomplicated: Secondary | ICD-10-CM | POA: Insufficient documentation

## 2021-12-02 DIAGNOSIS — Z79891 Long term (current) use of opiate analgesic: Secondary | ICD-10-CM

## 2021-12-02 DIAGNOSIS — D72829 Elevated white blood cell count, unspecified: Secondary | ICD-10-CM | POA: Insufficient documentation

## 2021-12-02 LAB — CBC WITH DIFFERENTIAL/PLATELET
Abs Immature Granulocytes: 0.22 10*3/uL — ABNORMAL HIGH (ref 0.00–0.07)
Basophils Absolute: 0.1 10*3/uL (ref 0.0–0.1)
Basophils Relative: 1 %
Eosinophils Absolute: 0 10*3/uL (ref 0.0–0.5)
Eosinophils Relative: 0 %
HCT: 34.1 % — ABNORMAL LOW (ref 39.0–52.0)
Hemoglobin: 12.2 g/dL — ABNORMAL LOW (ref 13.0–17.0)
Immature Granulocytes: 2 %
Lymphocytes Relative: 12 %
Lymphs Abs: 1.7 10*3/uL (ref 0.7–4.0)
MCH: 32 pg (ref 26.0–34.0)
MCHC: 35.8 g/dL (ref 30.0–36.0)
MCV: 89.5 fL (ref 80.0–100.0)
Monocytes Absolute: 1.6 10*3/uL — ABNORMAL HIGH (ref 0.1–1.0)
Monocytes Relative: 11 %
Neutro Abs: 11.1 10*3/uL — ABNORMAL HIGH (ref 1.7–7.7)
Neutrophils Relative %: 74 %
Platelets: 382 10*3/uL (ref 150–400)
RBC: 3.81 MIL/uL — ABNORMAL LOW (ref 4.22–5.81)
RDW: 20.1 % — ABNORMAL HIGH (ref 11.5–15.5)
WBC: 14.8 10*3/uL — ABNORMAL HIGH (ref 4.0–10.5)
nRBC: 9.7 % — ABNORMAL HIGH (ref 0.0–0.2)

## 2021-12-02 LAB — COMPREHENSIVE METABOLIC PANEL
ALT: 12 U/L (ref 0–44)
AST: 38 U/L (ref 15–41)
Albumin: 4.8 g/dL (ref 3.5–5.0)
Alkaline Phosphatase: 129 U/L — ABNORMAL HIGH (ref 38–126)
Anion gap: 10 (ref 5–15)
BUN: 10 mg/dL (ref 6–20)
CO2: 25 mmol/L (ref 22–32)
Calcium: 9.5 mg/dL (ref 8.9–10.3)
Chloride: 103 mmol/L (ref 98–111)
Creatinine, Ser: 0.59 mg/dL — ABNORMAL LOW (ref 0.61–1.24)
GFR, Estimated: >60 mL/min (ref >60)
Glucose, Bld: 112 mg/dL — ABNORMAL HIGH (ref 70–99)
Potassium: 3.4 mmol/L — ABNORMAL LOW (ref 3.5–5.1)
Sodium: 138 mmol/L (ref 135–145)
Total Bilirubin: 3 mg/dL — ABNORMAL HIGH (ref 0.3–1.2)
Total Protein: 7.9 g/dL (ref 6.5–8.1)

## 2021-12-02 LAB — RETICULOCYTES
Immature Retic Fract: 42.7 % — ABNORMAL HIGH (ref 2.3–15.9)
RBC.: 3.77 MIL/uL — ABNORMAL LOW (ref 4.22–5.81)
Retic Count, Absolute: 268 10*3/uL — ABNORMAL HIGH (ref 19.0–186.0)
Retic Ct Pct: 7.1 % — ABNORMAL HIGH (ref 0.4–3.1)

## 2021-12-02 MED ORDER — MAGNESIUM OXIDE -MG SUPPLEMENT 400 (240 MG) MG PO TABS
800.0000 mg | ORAL_TABLET | Freq: Once | ORAL | Status: AC
  Administered 2021-12-02: 800 mg via ORAL
  Filled 2021-12-02: qty 2

## 2021-12-02 MED ORDER — HYDROMORPHONE HCL 2 MG/ML IJ SOLN
2.0000 mg | INTRAMUSCULAR | Status: AC
  Administered 2021-12-02: 2 mg via INTRAVENOUS
  Filled 2021-12-02: qty 1

## 2021-12-02 MED ORDER — LACTATED RINGERS IV BOLUS
1000.0000 mL | Freq: Once | INTRAVENOUS | Status: AC
Start: 2021-12-02 — End: 2021-12-02
  Administered 2021-12-02: 1000 mL via INTRAVENOUS

## 2021-12-02 MED ORDER — ONDANSETRON HCL 4 MG/2ML IJ SOLN
4.0000 mg | INTRAMUSCULAR | Status: DC | PRN
  Administered 2021-12-02: 4 mg via INTRAVENOUS
  Filled 2021-12-02: qty 2

## 2021-12-02 MED ORDER — KETOROLAC TROMETHAMINE 15 MG/ML IJ SOLN
15.0000 mg | INTRAMUSCULAR | Status: AC
  Administered 2021-12-02: 15 mg via INTRAVENOUS
  Filled 2021-12-02: qty 1

## 2021-12-02 MED ORDER — OXYCODONE HCL 10 MG PO TABS: 10.0000 mg | ORAL_TABLET | ORAL | 0 refills | Status: DC | PRN

## 2021-12-02 MED ORDER — POTASSIUM CHLORIDE CRYS ER 20 MEQ PO TBCR
40.0000 meq | EXTENDED_RELEASE_TABLET | Freq: Once | ORAL | Status: AC
  Administered 2021-12-02: 40 meq via ORAL
  Filled 2021-12-02: qty 2

## 2021-12-02 NOTE — ED Provider Notes (Signed)
?[facility]-EMERGENCY DEPT ?Provider Note ? ?CSN: [id number] ?Arrival date & time: 12/02/21 2353 ? ?Chief Complaint(s) ?Sickle Cell Pain Crisis ? ?HPI ?Jonathan [name] is a 21 y.o. male with PMH sickle cell disease, previous history of acute chest in 2013 who presents emergency department for evaluation of a sickle cell pain crisis.  Patient was seen yesterday in the emergency department and was successfully pain controlled with multiple doses of Dilaudid per sickle cell protocol.  Patient appropriately had plans to follow-up with the sickle cell clinic this morning, but was told that there are no nurses available in the clinic and he needs to return to the emergency department.  Patient is complaining of pain in bilateral lower extremities consistent with his typical pain crisis.  Denies fever, chest pain, shortness of breath, abdominal pain, nausea, vomiting or other systemic symptoms. ? ? ?Sickle Cell Pain Crisis ? ?Past Medical History ?Past Medical History:  ?Diagnosis Date  ? Acute chest syndrome(517.3) 2013  ? march 2013, Sept 2013  ? Asthma   ? Avascular necrosis of bone of left hip (HCC)   ? 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 Sickle cell disease with crisis (HCC)   ? Sickle cell disease, type SS (HCC)   ? Sickle cell pain crisis (HCC) 10/02/2012  ? Vaso-occlusive sickle cell crisis (HCC) 11/09/2011  ? ?Patient Active Problem List  ? Diagnosis Date Noted  ? 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HCC) 09/02/2021  ? Sickle cell crisis (HCC) 07/27/2021  ? COVID-19 virus infection 07/27/2021  ? Anemia of chronic disease 06/27/2021  ? Thrombocytosis 06/27/2021  ? Mild intermittent asthma without complication 11/11/2020  ? Sickle cell disease with crisis (HCC) 10/23/2020  ? Sickle cell pain crisis (HCC) 08/16/2020  ? Proteinuria 04/14/2020  ? Influenza A 10/13/2017  ? Sickle cell disease (HCC) 04/27/2017  ? Failed hearing screening 03/25/2016  ? Status post left hip replacement  03/07/2016  ? Myopic astigmatism 03/16/2015  ? Avascular necrosis of bone of left hip (HCC) 03/24/2013  ? Chronic obstruct airways disease (HCC) 03/15/2013  ? Snoring 04/08/2012  ? Hypertension 11/11/2011  ? Reactive airway disease 11/09/2011  ? ?Home Medication(s) ?Prior to Admission medications   ?Medication Sig Start Date End Date Taking? Authorizing Provider  ?ergocalciferol (DRISDOL) 1.25 MG (50000 UT) capsule Take 1 capsule (50,000 Units total) by mouth once a week. ?Patient taking differently: Take 50,000 Units by mouth every 7 (seven) days. Sunday's 04/12/20   Barbette Merino, NP  ?folic acid (FOLVITE) 1 MG tablet Take 1 tablet (1 mg total) by mouth daily. TAKE 1 TABLET BY MOUTH DAILY Strength 1 mg ?Patient taking differently: Take 1 mg by mouth daily. 10/22/21   Massie Maroon, FNP  ?ibuprofen (ADVIL) 800 MG tablet Take 1 tablet (800 mg total) by mouth every 6 (six) hours as needed for moderate pain. 10/02/21   Massie Maroon, FNP  ?Oxycodone HCl 10 MG TABS Take 1 tablet (10 mg total) by mouth every 4 (four) hours as needed (pain). 12/02/21   Sidrah Harden, Wyn Forster, MD  ?                                                                                                                                  ?  Past Surgical History ?Past Surgical History:  ?Procedure Laterality Date  ? ADENOIDECTOMY    ? SPLENECTOMY    ? TONSILLECTOMY    ? TONSILLECTOMY AND ADENOIDECTOMY    ? TOTAL HIP ARTHROPLASTY Left 03/07/2016  ? UMBILICAL HERNIA REPAIR    ? ?Family History ?Family History  ?Problem Relation Age of Onset  ? Hypertension Mother   ? Hypertension Maternal Grandmother   ? Hypertension Maternal Grandfather   ? Asthma Paternal Grandfather   ? Diabetes Paternal Grandfather   ? ? ?Social History ?Social History  ? ?Tobacco Use  ? Smoking status: Never  ? Smokeless tobacco: Never  ?Vaping Use  ? Vaping Use: Never used  ?Substance Use Topics  ? Alcohol use: No  ?  Alcohol/week: 0.0 standard drinks  ? Drug use: No   ? ?Allergies ?Allergy medication [diphenhydramine] ? ?Review of Systems ?Review of Systems  ?Musculoskeletal:  Positive for arthralgias, back pain and myalgias.  ? ?Physical Exam ?Vital Signs  ?I have reviewed the triage vital signs ?BP 132/86   Pulse 90   Temp 98.2 ?F (36.8 ?C) (Oral)   Resp 12   Ht 5\' 5"  (1.651 m)   Wt 68 kg   SpO2 95%   BMI 24.96 kg/m?  ? ?Physical Exam ?Vitals and nursing note reviewed.  ?Constitutional:   ?   General: He is not in acute distress. ?   Appearance: He is well-developed.  ?HENT:  ?   Head: Normocephalic and atraumatic.  ?Eyes:  ?   Conjunctiva/sclera: Conjunctivae normal.  ?Cardiovascular:  ?   Rate and Rhythm: Normal rate and regular rhythm.  ?   Heart sounds: No murmur heard. ?Pulmonary:  ?   Effort: Pulmonary effort is normal. No respiratory distress.  ?   Breath sounds: Normal breath sounds.  ?Abdominal:  ?   Palpations: Abdomen is soft.  ?   Tenderness: There is no abdominal tenderness.  ?Musculoskeletal:     ?   General: Tenderness (lower extremities) present. No swelling.  ?   Cervical back: Neck supple.  ?Skin: ?   General: Skin is warm and dry.  ?   Capillary Refill: Capillary refill takes less than 2 seconds.  ?Neurological:  ?   Mental Status: He is alert.  ?Psychiatric:     ?   Mood and Affect: Mood normal.  ? ? ?ED Results and Treatments ?Labs ?(all labs ordered are listed, but only abnormal results are displayed) ?Labs Reviewed  ?CBC WITH DIFFERENTIAL/PLATELET - Abnormal; Notable for the following components:  ?    Result Value  ? WBC 14.8 (*)   ? RBC 3.81 (*)   ? Hemoglobin 12.2 (*)   ? HCT 34.1 (*)   ? RDW 20.1 (*)   ? nRBC 9.7 (*)   ? Neutro Abs 11.1 (*)   ? Monocytes Absolute 1.6 (*)   ? Abs Immature Granulocytes 0.22 (*)   ? All other components within normal limits  ?RETICULOCYTES - Abnormal; Notable for the following components:  ? Retic Ct Pct 7.1 (*)   ? RBC. 3.77 (*)   ? Retic Count, Absolute 268.0 (*)   ? Immature Retic Fract 42.7 (*)   ? All other  components within normal limits  ?COMPREHENSIVE METABOLIC PANEL - Abnormal; Notable for the following components:  ? Potassium 3.4 (*)   ? Glucose, Bld 112 (*)   ? Creatinine, Ser 0.59 (*)   ? Alkaline Phosphatase 129 (*)   ? Total Bilirubin 3.0 (*)   ?  All other components within normal limits  ?                                                                                                                       ? ?Radiology ?No results found. ? ?Pertinent labs & imaging results that were available during my care of the patient were reviewed by me and considered in my medical decision making (see MDM for details). ? ?Medications Ordered in ED ?Medications  ?ondansetron (ZOFRAN) injection 4 mg (4 mg Intravenous Given 12/02/21 0924)  ?ketorolac (TORADOL) 15 MG/ML injection 15 mg (15 mg Intravenous Given 12/02/21 0924)  ?HYDROmorphone (DILAUDID) injection 2 mg (2 mg Intravenous Given 12/02/21 0921)  ?HYDROmorphone (DILAUDID) injection 2 mg (2 mg Intravenous Given 12/02/21 0955)  ?HYDROmorphone (DILAUDID) injection 2 mg (2 mg Intravenous Given 12/02/21 1043)  ?lactated ringers bolus 1,000 mL (0 mLs Intravenous Stopped 12/02/21 1045)  ?potassium chloride SA (KLOR-CON M) CR tablet 40 mEq (40 mEq Oral Given 12/02/21 1043)  ?magnesium oxide (MAG-OX) tablet 800 mg (800 mg Oral Given 12/02/21 1043)  ?                                                               ?                                                                    ?Procedures ?Procedures ? ?(including critical care time) ? ?Medical Decision Making / ED Course ? ? ?This patient presents to the ED for concern of sickle cell pain, lower extremity pain, this involves an extensive number of treatment options, and is a complaint that carries with it a high risk of complications and morbidity.  The differential diagnosis includes sickle cell pain crisis, avascular necrosis, failure of outpatient regimen ? ?MDM: ?Patient seen in the emergency department for evaluation of  lower extremity pain in setting of sickle cell.  Physical exam with tenderness to bilateral lower extremities but is otherwise unremarkable.  Laboratory evaluation with leukocytosis to 14.8 likely elevated in setting of stress

## 2021-12-02 NOTE — ED Triage Notes (Signed)
Pt presents with c/o sickle cell pain. Pt reports the pain is in his lower back. ?

## 2021-12-03 ENCOUNTER — Non-Acute Institutional Stay (HOSPITAL_BASED_OUTPATIENT_CLINIC_OR_DEPARTMENT_OTHER)
Admission: AD | Admit: 2021-12-03 | Discharge: 2021-12-03 | Disposition: A | Discharge status: Home / Self Care | Payer: Commercial Managed Care - PPO | Source: Ambulatory Visit | Attending: Internal Medicine | Admitting: Internal Medicine

## 2021-12-03 ENCOUNTER — Telehealth (HOSPITAL_COMMUNITY): Payer: Self-pay | Admitting: General Practice

## 2021-12-03 DIAGNOSIS — J45909 Unspecified asthma, uncomplicated: Secondary | ICD-10-CM | POA: Insufficient documentation

## 2021-12-03 DIAGNOSIS — D5700 Hb-SS disease with crisis, unspecified: Secondary | ICD-10-CM | POA: Insufficient documentation

## 2021-12-03 LAB — COMPREHENSIVE METABOLIC PANEL
ALT: 13 U/L (ref 0–44)
AST: 34 U/L (ref 15–41)
Albumin: 4.2 g/dL (ref 3.5–5.0)
Alkaline Phosphatase: 168 U/L — ABNORMAL HIGH (ref 38–126)
Anion gap: 8 (ref 5–15)
BUN: 11 mg/dL (ref 6–20)
CO2: 27 mmol/L (ref 22–32)
Calcium: 9.1 mg/dL (ref 8.9–10.3)
Chloride: 103 mmol/L (ref 98–111)
Creatinine, Ser: 0.76 mg/dL (ref 0.61–1.24)
GFR, Estimated: >60 mL/min (ref >60)
Glucose, Bld: 117 mg/dL — ABNORMAL HIGH (ref 70–99)
Potassium: 3.3 mmol/L — ABNORMAL LOW (ref 3.5–5.1)
Sodium: 138 mmol/L (ref 135–145)
Total Bilirubin: 3.6 mg/dL — ABNORMAL HIGH (ref 0.3–1.2)
Total Protein: 7.6 g/dL (ref 6.5–8.1)

## 2021-12-03 LAB — LACTATE DEHYDROGENASE: LDH: 669 U/L — ABNORMAL HIGH (ref 98–192)

## 2021-12-03 MED ORDER — ONDANSETRON HCL 4 MG/2ML IJ SOLN
4.0000 mg | Freq: Four times a day (QID) | INTRAMUSCULAR | Status: DC | PRN
  Administered 2021-12-03: 4 mg via INTRAVENOUS
  Filled 2021-12-03: qty 2

## 2021-12-03 MED ORDER — SODIUM CHLORIDE 0.9% FLUSH: 9.0000 mL | INTRAVENOUS | Status: DC | PRN

## 2021-12-03 MED ORDER — KETOROLAC TROMETHAMINE 30 MG/ML IJ SOLN
15.0000 mg | Freq: Four times a day (QID) | INTRAMUSCULAR | Status: DC
  Administered 2021-12-03: 15 mg via INTRAVENOUS
  Filled 2021-12-03: qty 1

## 2021-12-03 MED ORDER — DIPHENHYDRAMINE HCL 25 MG PO CAPS: 25.0000 mg | ORAL_CAPSULE | ORAL | Status: DC | PRN

## 2021-12-03 MED ORDER — SENNOSIDES-DOCUSATE SODIUM 8.6-50 MG PO TABS: 1.0000 | ORAL_TABLET | Freq: Two times a day (BID) | ORAL | Status: DC

## 2021-12-03 MED ORDER — DEXTROSE-NACL 5-0.45 % IV SOLN: INTRAVENOUS | Status: DC

## 2021-12-03 MED ORDER — POLYETHYLENE GLYCOL 3350 17 G PO PACK: 17.0000 g | PACK | Freq: Every day | ORAL | Status: DC | PRN

## 2021-12-03 MED ORDER — HYDROMORPHONE 1 MG/ML IV SOLN
INTRAVENOUS | Status: DC
  Administered 2021-12-03: 8 mg via INTRAVENOUS
  Filled 2021-12-03: qty 30

## 2021-12-03 MED ORDER — NALOXONE HCL 0.4 MG/ML IJ SOLN: 0.4000 mg | INTRAMUSCULAR | Status: DC | PRN

## 2021-12-03 NOTE — H&P (Signed)
PCP:  ? Quentin Angst, MD  ? ?Chief Complaint:  ?Pain in arms and back ? ?HPI: ?Patient is a 21 year old with known sickle cell disease who presents to the ER with sickle cell pain crisis.  Patient has been taking his home regimen.  She was also in the ER over the weekend with sickle cell pain.  He was seen and treated.  Patient improved and went home but he returns now due to worsening pain.  Patient is therefore being admitted to the hospital for further evaluation and treatment ? ?Review of Systems:  ?The patient denies anorexia, fever, weight loss,, vision loss, decreased hearing, hoarseness, chest pain, syncope, dyspnea on exertion, peripheral edema, balance deficits, hemoptysis, abdominal pain, melena, hematochezia, severe indigestion/heartburn, hematuria, incontinence, genital sores, muscle weakness, suspicious skin lesions, transient blindness, difficulty walking, depression, unusual weight change, abnormal bleeding, enlarged lymph nodes, angioedema, and breast masses. ? ?Past Medical History: ?Past Medical History:  ?Diagnosis Date  ? Acute chest syndrome(517.3) 2013  ? march 2013, Sept 2013  ? Asthma   ? Avascular necrosis of bone of left hip (HCC)   ? 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 Sickle cell disease with crisis (HCC)   ? Sickle cell disease, type SS (HCC)   ? Sickle cell pain crisis (HCC) 10/02/2012  ? Vaso-occlusive sickle cell crisis (HCC) 11/09/2011  ? ?Past Surgical History:  ?Procedure Laterality Date  ? ADENOIDECTOMY    ? SPLENECTOMY    ? TONSILLECTOMY    ? TONSILLECTOMY AND ADENOIDECTOMY    ? TOTAL HIP ARTHROPLASTY Left 03/07/2016  ? UMBILICAL HERNIA REPAIR    ? ? ?Medications: ?Prior to Admission medications   ?Medication Sig Start Date End Date Taking? Authorizing Provider  ?ergocalciferol (DRISDOL) 1.25 MG (50000 UT) capsule Take 1 capsule (50,000 Units total) by mouth once a week. ?Patient taking differently: Take 50,000 Units by mouth every 7 (seven) days.  Sunday's 04/12/20   Barbette Merino, NP  ?folic acid (FOLVITE) 1 MG tablet Take 1 tablet (1 mg total) by mouth daily. TAKE 1 TABLET BY MOUTH DAILY Strength 1 mg ?Patient taking differently: Take 1 mg by mouth daily. 10/22/21   Massie Maroon, FNP  ?ibuprofen (ADVIL) 800 MG tablet Take 1 tablet (800 mg total) by mouth every 6 (six) hours as needed for moderate pain. 10/02/21   Massie Maroon, FNP  ?Oxycodone HCl 10 MG TABS Take 1 tablet (10 mg total) by mouth every 4 (four) hours as needed (pain). 12/02/21   Kommor, Wyn Forster, MD  ? ? ?Allergies: ?  ?Allergies  ?Allergen Reactions  ? Allergy Medication [Diphenhydramine] Swelling  ?  Face swells up  ? ? ?Social History: ? reports that he has never smoked. He has never used smokeless tobacco. He reports that he does not drink alcohol and does not use drugs. ? ?Family History: ?Family History  ?Problem Relation Age of Onset  ? Hypertension Mother   ? Hypertension Maternal Grandmother   ? Hypertension Maternal Grandfather   ? Asthma Paternal Grandfather   ? Diabetes Paternal Grandfather   ? ? ?Physical Exam: ?There were no vitals filed for this visit. ?General appearance: alert, cooperative, appears stated age, and no distress ?Resp: clear to auscultation bilaterally ?Cardio: regular rate and rhythm, S1, S2 normal, no murmur, click, rub or gallop ?GI: soft, non-tender; bowel sounds normal; no masses,  no organomegaly ? ? ?Labs on Admission:  ?Recent Labs  ?  12/01/21 ?1650 12/02/21 ?0932  ?NA 140  138  ?K 3.9 3.4*  ?CL 106 103  ?CO2 28 25  ?GLUCOSE 128* 112*  ?BUN 6 10  ?CREATININE 0.74 0.59*  ?CALCIUM 9.7 9.5  ? ?Recent Labs  ?  12/02/21 ?0932  ?AST 38  ?ALT 12  ?ALKPHOS 129*  ?BILITOT 3.0*  ?PROT 7.9  ?ALBUMIN 4.8  ? ?No results for input(s): LIPASE, AMYLASE in the last 72 hours. ?Recent Labs  ?  12/01/21 ?1650 12/02/21 ?0932  ?WBC 13.1* 14.8*  ?NEUTROABS 8.1* 11.1*  ?HGB 11.5* 12.2*  ?HCT 33.6* 34.1*  ?MCV 91.1 89.5  ?PLT 402* 382  ? ?No results for input(s): CKTOTAL,  CKMB, CKMBINDEX, TROPONINI in the last 72 hours. ?No results for input(s): TSH, T4TOTAL, T3FREE, THYROIDAB in the last 72 hours. ? ?Invalid input(s): FREET3 ?Recent Labs  ?  12/01/21 ?1650 12/02/21 ?0932  ?RETICCTPCT 7.5* 7.1*  ? ? ?Radiological Exams on Admission: ?No results found. ? ?Assessment/Plan ?Present on Admission: ? Reactive airway disease ? Sickle cell disease with crisis (HCC) ? ? ?Falisha Osment,LAWAL ?12/03/2021, 9:32 AM  ?

## 2021-12-03 NOTE — Progress Notes (Signed)
Pt admitted to the day hospital for treatment of sickle cell pain crisis. Pt reported pain to bilateral legs and back rated 7/10. Pt given IV zofran and toradol, placed on Dilaudid PCA, and hydrated with IV fluids. At discharge patient reported pain a 3/10. Pt provided with AVS, and reminded of [place] infusion scheduled for 4/14, pt verbalized understanding. Pt alert , oriented and ambulatory at discharge.   ?

## 2021-12-03 NOTE — Telephone Encounter (Signed)
Patient called, requesting to come to the day hospital due to pain in the legs rated at 7/10. Denied chest pain, fever, diarrhea, abdominal pain, nausea/vomiting and priapism. Screened negative for Covid-19 symptoms. Admitted to having means of transportation without driving self after treatment. Last took 10 mg of Oxycodone, 800 mg of Ibuprofen at 06:00 and 05:00 am respectively today. Per provider, patient can come to the day hospital for treatment. Patient notified, verbalized understanding.  ?

## 2021-12-03 NOTE — Discharge Summary (Signed)
DISCHARGE SUMMARY ? ?Jonathan [name] ? ?MR#: [id number] ? ?DOB:13-Dec-2000  ?Date of Admission: 12/03/2021 ?Date of Discharge: 12/03/2021 ? ?Attending Physician:Jeilani Grupe,LAWAL ? ?Patient's JME:QASTMH, [name], MD ? ?Consults:  None ?Discharge Diagnoses: ?Present on Admission: ? Reactive airway disease ? Sickle cell disease with crisis (HCC) ? ? ? ? ? ? ? ? ?Hospital Course: ?Present on Admission: ? Reactive airway disease ? Sickle cell disease with crisis [facility]) ?: Patient was admitted to the hospital.  Initiated on Dilaudid PCA, Toradol and IV fluids.  Also supportive care.  Patient did better.  Pain was controlled.  At time of discharge patient was back to baseline.  He has his home regimen which he will continue taking at home.  Otherwise he was significantly doing better. ? ? ?Day of Discharge ?BP 140/85 (BP Location: Left Arm)   Pulse (!) 107   Temp 98.2 ?F (36.8 ?C) (Temporal)   Resp (!) 21   SpO2 100%  ? ?Physical Exam: ?General: Stable no distress ?Respiratory: Good air entry bilaterally no wheeze rales or crackles ?Cardiovascular system: Regular rate and rhythm ? ?Results for orders placed or performed during the hospital encounter of 12/03/21 (from the past 24 hour(s))  ?Lactate dehydrogenase     Status: Abnormal  ? Collection Time: 12/03/21 10:05 AM  ?Result Value Ref Range  ? LDH 669 (H) 98 - 192 U/L  ?Comprehensive metabolic panel     Status: Abnormal  ? Collection Time: 12/03/21 10:05 AM  ?Result Value Ref Range  ? Sodium 138 135 - 145 mmol/L  ? Potassium 3.3 (L) 3.5 - 5.1 mmol/L  ? Chloride 103 98 - 111 mmol/L  ? CO2 27 22 - 32 mmol/L  ? Glucose, Bld 117 (H) 70 - 99 mg/dL  ? BUN 11 6 - 20 mg/dL  ? Creatinine, Ser 0.76 0.61 - 1.24 mg/dL  ? Calcium 9.1 8.9 - 10.3 mg/dL  ? Total Protein 7.6 6.5 - 8.1 g/dL  ? Albumin 4.2 3.5 - 5.0 g/dL  ? AST 34 15 - 41 U/L  ? ALT 13 0 - 44 U/L  ? Alkaline Phosphatase 168 (H) 38 - 126 U/L  ? Total Bilirubin 3.6 (H) 0.3 - 1.2 mg/dL  ? GFR, Estimated >60 >60 mL/min  ?  Anion gap 8 5 - 15  ? ? ?Disposition: Home ? ? ?Follow-up Appts: ? ? ?Follow-up with Dr. PCP. ? ? ?Tests Needing Follow-up: ?None ? ?Signed: ?Kevin Space,LAWAL ?12/03/2021, 6:27 PM   ?

## 2021-12-06 ENCOUNTER — Inpatient Hospital Stay (HOSPITAL_COMMUNITY)
Admission: EM | Admit: 2021-12-06 | Discharge: 2021-12-08 | DRG: 812 | Disposition: A | Discharge status: Home / Self Care | Payer: Commercial Managed Care - PPO | Attending: Internal Medicine | Admitting: Internal Medicine

## 2021-12-06 ENCOUNTER — Encounter (HOSPITAL_COMMUNITY): Payer: Self-pay

## 2021-12-06 ENCOUNTER — Encounter (HOSPITAL_COMMUNITY): Payer: Commercial Managed Care - PPO

## 2021-12-06 ENCOUNTER — Other Ambulatory Visit: Payer: Self-pay

## 2021-12-06 ENCOUNTER — Telehealth: Payer: Self-pay

## 2021-12-06 DIAGNOSIS — J449 Chronic obstructive pulmonary disease, unspecified: Secondary | ICD-10-CM | POA: Diagnosis present

## 2021-12-06 DIAGNOSIS — Z79891 Long term (current) use of opiate analgesic: Secondary | ICD-10-CM

## 2021-12-06 DIAGNOSIS — Z825 Family history of asthma and other chronic lower respiratory diseases: Secondary | ICD-10-CM

## 2021-12-06 DIAGNOSIS — D72829 Elevated white blood cell count, unspecified: Secondary | ICD-10-CM | POA: Diagnosis present

## 2021-12-06 DIAGNOSIS — G894 Chronic pain syndrome: Secondary | ICD-10-CM | POA: Diagnosis present

## 2021-12-06 DIAGNOSIS — Z9081 Acquired absence of spleen: Secondary | ICD-10-CM

## 2021-12-06 DIAGNOSIS — Z833 Family history of diabetes mellitus: Secondary | ICD-10-CM

## 2021-12-06 DIAGNOSIS — D571 Sickle-cell disease without crisis: Secondary | ICD-10-CM

## 2021-12-06 DIAGNOSIS — D649 Anemia, unspecified: Secondary | ICD-10-CM

## 2021-12-06 DIAGNOSIS — D638 Anemia in other chronic diseases classified elsewhere: Secondary | ICD-10-CM | POA: Diagnosis present

## 2021-12-06 DIAGNOSIS — Z96642 Presence of left artificial hip joint: Secondary | ICD-10-CM | POA: Diagnosis present

## 2021-12-06 DIAGNOSIS — E871 Hypo-osmolality and hyponatremia: Secondary | ICD-10-CM | POA: Diagnosis present

## 2021-12-06 DIAGNOSIS — I10 Essential (primary) hypertension: Secondary | ICD-10-CM | POA: Diagnosis present

## 2021-12-06 DIAGNOSIS — Z8249 Family history of ischemic heart disease and other diseases of the circulatory system: Secondary | ICD-10-CM | POA: Diagnosis not present

## 2021-12-06 DIAGNOSIS — D5700 Hb-SS disease with crisis, unspecified: Secondary | ICD-10-CM | POA: Diagnosis not present

## 2021-12-06 LAB — COMPREHENSIVE METABOLIC PANEL
ALT: 10 U/L (ref 0–44)
AST: 19 U/L (ref 15–41)
Albumin: 3.7 g/dL (ref 3.5–5.0)
Alkaline Phosphatase: 129 U/L — ABNORMAL HIGH (ref 38–126)
Anion gap: 9 (ref 5–15)
BUN: 13 mg/dL (ref 6–20)
CO2: 26 mmol/L (ref 22–32)
Calcium: 8.9 mg/dL (ref 8.9–10.3)
Chloride: 99 mmol/L (ref 98–111)
Creatinine, Ser: 0.9 mg/dL (ref 0.61–1.24)
GFR, Estimated: >60 mL/min (ref >60)
Glucose, Bld: 120 mg/dL — ABNORMAL HIGH (ref 70–99)
Potassium: 3.7 mmol/L (ref 3.5–5.1)
Sodium: 134 mmol/L — ABNORMAL LOW (ref 135–145)
Total Bilirubin: 2.2 mg/dL — ABNORMAL HIGH (ref 0.3–1.2)
Total Protein: 7.8 g/dL (ref 6.5–8.1)

## 2021-12-06 LAB — CBC WITH DIFFERENTIAL/PLATELET
Abs Immature Granulocytes: 0.06 10*3/uL (ref 0.00–0.07)
Basophils Absolute: 0.1 10*3/uL (ref 0.0–0.1)
Basophils Relative: 0 %
Eosinophils Absolute: 0.1 10*3/uL (ref 0.0–0.5)
Eosinophils Relative: 0 %
HCT: 19.8 % — ABNORMAL LOW (ref 39.0–52.0)
Hemoglobin: 6.8 g/dL — CL (ref 13.0–17.0)
Immature Granulocytes: 0 %
Lymphocytes Relative: 15 %
Lymphs Abs: 2.1 10*3/uL (ref 0.7–4.0)
MCH: 30 pg (ref 26.0–34.0)
MCHC: 34.3 g/dL (ref 30.0–36.0)
MCV: 87.2 fL (ref 80.0–100.0)
Monocytes Absolute: 1.8 10*3/uL — ABNORMAL HIGH (ref 0.1–1.0)
Monocytes Relative: 12 %
Neutro Abs: 10.1 10*3/uL — ABNORMAL HIGH (ref 1.7–7.7)
Neutrophils Relative %: 73 %
Platelets: 309 10*3/uL (ref 150–400)
RBC: 2.27 MIL/uL — ABNORMAL LOW (ref 4.22–5.81)
RDW: 18.6 % — ABNORMAL HIGH (ref 11.5–15.5)
WBC: 14.1 10*3/uL — ABNORMAL HIGH (ref 4.0–10.5)
nRBC: 1.9 % — ABNORMAL HIGH (ref 0.0–0.2)

## 2021-12-06 LAB — LACTATE DEHYDROGENASE: LDH: 327 U/L — ABNORMAL HIGH (ref 98–192)

## 2021-12-06 LAB — RETICULOCYTES
Immature Retic Fract: 43.6 % — ABNORMAL HIGH (ref 2.3–15.9)
RBC.: 2.27 MIL/uL — ABNORMAL LOW (ref 4.22–5.81)
Retic Count, Absolute: 148.5 10*3/uL (ref 19.0–186.0)
Retic Ct Pct: 6.5 % — ABNORMAL HIGH (ref 0.4–3.1)

## 2021-12-06 LAB — PREPARE RBC (CROSSMATCH)

## 2021-12-06 MED ORDER — SODIUM CHLORIDE 0.9% FLUSH
9.0000 mL | INTRAVENOUS | Status: DC | PRN
Start: 2021-12-06 — End: 2021-12-08

## 2021-12-06 MED ORDER — FOLIC ACID 1 MG PO TABS
1.0000 mg | ORAL_TABLET | Freq: Every day | ORAL | Status: DC
  Administered 2021-12-07 – 2021-12-08 (×2): 1 mg via ORAL
  Filled 2021-12-06 (×2): qty 1

## 2021-12-06 MED ORDER — SENNOSIDES-DOCUSATE SODIUM 8.6-50 MG PO TABS
1.0000 | ORAL_TABLET | Freq: Two times a day (BID) | ORAL | Status: DC
  Administered 2021-12-06 – 2021-12-08 (×4): 1 via ORAL
  Filled 2021-12-06 (×4): qty 1

## 2021-12-06 MED ORDER — DEXTROSE-NACL 5-0.45 % IV SOLN: INTRAVENOUS | Status: DC

## 2021-12-06 MED ORDER — ONDANSETRON HCL 4 MG/2ML IJ SOLN: 4.0000 mg | Freq: Four times a day (QID) | INTRAMUSCULAR | Status: DC | PRN

## 2021-12-06 MED ORDER — SODIUM CHLORIDE 0.9 % IV SOLN: 10.0000 mL/h | Freq: Once | INTRAVENOUS | Status: DC

## 2021-12-06 MED ORDER — NALOXONE HCL 0.4 MG/ML IJ SOLN: 0.4000 mg | INTRAMUSCULAR | Status: DC | PRN

## 2021-12-06 MED ORDER — HYDROMORPHONE HCL 2 MG/ML IJ SOLN
2.0000 mg | INTRAMUSCULAR | Status: AC
  Administered 2021-12-06: 2 mg via INTRAVENOUS
  Filled 2021-12-06: qty 1

## 2021-12-06 MED ORDER — DIPHENHYDRAMINE HCL 25 MG PO CAPS: 25.0000 mg | ORAL_CAPSULE | ORAL | Status: DC | PRN

## 2021-12-06 MED ORDER — ONDANSETRON HCL 4 MG/2ML IJ SOLN
4.0000 mg | Freq: Once | INTRAMUSCULAR | Status: AC
  Administered 2021-12-06: 4 mg via INTRAVENOUS
  Filled 2021-12-06: qty 2

## 2021-12-06 MED ORDER — KETOROLAC TROMETHAMINE 30 MG/ML IJ SOLN
15.0000 mg | Freq: Once | INTRAMUSCULAR | Status: AC
  Administered 2021-12-06: 15 mg via INTRAVENOUS
  Filled 2021-12-06: qty 1

## 2021-12-06 MED ORDER — KETOROLAC TROMETHAMINE 15 MG/ML IJ SOLN
15.0000 mg | Freq: Four times a day (QID) | INTRAMUSCULAR | Status: DC
  Administered 2021-12-06 – 2021-12-08 (×8): 15 mg via INTRAVENOUS
  Filled 2021-12-06 (×8): qty 1

## 2021-12-06 MED ORDER — ENOXAPARIN SODIUM 40 MG/0.4ML IJ SOSY
40.0000 mg | PREFILLED_SYRINGE | Freq: Every day | INTRAMUSCULAR | Status: DC
  Administered 2021-12-06 – 2021-12-07 (×2): 40 mg via SUBCUTANEOUS
  Filled 2021-12-06 (×2): qty 0.4

## 2021-12-06 MED ORDER — POLYETHYLENE GLYCOL 3350 17 G PO PACK: 17.0000 g | PACK | Freq: Every day | ORAL | Status: DC | PRN

## 2021-12-06 MED ORDER — HYDROMORPHONE 1 MG/ML IV SOLN
INTRAVENOUS | Status: DC
  Administered 2021-12-07: 5 mg via INTRAVENOUS
  Administered 2021-12-07: 2.5 mg via INTRAVENOUS
  Administered 2021-12-07: 6 mg via INTRAVENOUS
  Administered 2021-12-07: 5 mg via INTRAVENOUS
  Administered 2021-12-07 (×2): 4 mg via INTRAVENOUS
  Administered 2021-12-07: 8 mg via INTRAVENOUS
  Administered 2021-12-08: 6.5 mg via INTRAVENOUS
  Administered 2021-12-08: 4 mg via INTRAVENOUS
  Administered 2021-12-08: 3.5 mg via INTRAVENOUS
  Filled 2021-12-06 (×3): qty 30

## 2021-12-06 NOTE — Discharge Instructions (Addendum)
Continue your home medications as previously prescribed. ?Return to the ER if you start develop worsening symptoms, numbness, weakness, chest pain, shortness of breath ?

## 2021-12-06 NOTE — H&P (Signed)
?History and Physical  ? ? ?Patient: Jonathan [name] JOA:[id number] DOB: [birth year] ?DOA: 12/06/2021 ?DOS: the patient was seen and examined on 12/06/2021 ?PCP: Quentin Angst, MD  ?Patient coming from: Home ? ?Chief Complaint:  ?Chief Complaint  ?Patient presents with  ? Sickle Cell Pain Crisis  ? ?HPI: Jonathan [name] is a 21 y.o. male with medical history significant of sickle cell disease who presented to the ER with pain in his back and legs consistent with his typical sickle cell crisis.  Patient apparently has been dealing with this over the last week.  He was at the hospital once and twice to the ER this week.  Pain was getting better and then coming back.  Currently rated 8 out of 10.  It is all over but more on his hips and his arms.  Patient had history of avascular necrosis of the left hip.  He was in the ER today where he got multiple doses of Dilaudid.  Pain has improved but still not at goal.  Additionally patient's hemoglobin was noted to drop from 12.2-6.8 in 4 days.  His baseline is somewhere between 10 and 11 g.  Patient is therefore being admitted to the hospital for further evaluation and treatment.  There is worried some that he may be having hemolytic crisis.  His LDH was 669 2 days ago.  Also noted to have leukocytosis. ? ?Review of Systems: As mentioned in the history of present illness. All other systems reviewed and are negative. ?Past Medical History:  ?Diagnosis Date  ? Acute chest syndrome(517.3) 2013  ? march 2013, Sept 2013  ? Asthma   ? Avascular necrosis of bone of left hip (HCC)   ? 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 Sickle cell disease with crisis (HCC)   ? Sickle cell disease, type SS (HCC)   ? Sickle cell pain crisis (HCC) 10/02/2012  ? Vaso-occlusive sickle cell crisis (HCC) 11/09/2011  ? ?Past Surgical History:  ?Procedure Laterality Date  ? ADENOIDECTOMY    ? SPLENECTOMY    ? TONSILLECTOMY    ? TONSILLECTOMY AND ADENOIDECTOMY    ? TOTAL HIP  ARTHROPLASTY Left 03/07/2016  ? UMBILICAL HERNIA REPAIR    ? ?Social History:  reports that he has never smoked. He has never used smokeless tobacco. He reports that he does not drink alcohol and does not use drugs. ? ?Allergies  ?Allergen Reactions  ? Allergy Medication [Diphenhydramine] Swelling and Other (See Comments)  ?  Face swells  ? ? ?Family History  ?Problem Relation Age of Onset  ? Hypertension Mother   ? Hypertension Maternal Grandmother   ? Hypertension Maternal Grandfather   ? Asthma Paternal Grandfather   ? Diabetes Paternal Grandfather   ? ? ?Prior to Admission medications   ?Medication Sig Start Date End Date Taking? Authorizing Provider  ?folic acid (FOLVITE) 1 MG tablet Take 1 tablet (1 mg total) by mouth daily. TAKE 1 TABLET BY MOUTH DAILY Strength 1 mg ?Patient taking differently: Take 1 mg by mouth daily. 10/22/21  Yes Massie Maroon, FNP  ?ibuprofen (ADVIL) 800 MG tablet Take 1 tablet (800 mg total) by mouth every 6 (six) hours as needed for moderate pain. 10/02/21  Yes Massie Maroon, FNP  ?Oxycodone HCl 10 MG TABS Take 1 tablet (10 mg total) by mouth every 4 (four) hours as needed (pain). 12/02/21  Yes Kommor, [place], MD  ?ergocalciferol (DRISDOL) 1.25 MG (50000 UT) capsule Take 1 capsule (50,000 Units  total) by mouth once a week. ?Patient not taking: Reported on 12/06/2021 04/12/20   Barbette Merino, NP  ? ? ?Physical Exam: ?Vitals:  ? 12/06/21 1530 12/06/21 1600 12/06/21 1631 12/06/21 1700  ?BP: 136/77 (!) 142/82 130/69 135/82  ?Pulse: (!) 112 (!) 114 89 (!) 104  ?Resp: (!) 21 17 16 18   ?Temp:      ?TempSrc:      ?SpO2: 94% 96% 98% 100%  ?Height:      ? ?General: Acutely ill looking in mild distress due to pain ?HEENT: PRL EOMI, mild jaundice ?Respiratory: Good air entry bilaterally no wheeze rales or crackles ?Cardiovascular: Regular rate and rhythm ?Abdomen: Soft, nontender with positive bowel sounds ?Extremities: No edema sinus or clubbing ? ?Data Reviewed: ? ?Results are pending, will  review when available. ? ?Assessment and Plan: ? ?#1 sickle cell pain crisis: Patient will be admitted for pain management and management of possible hemolytic crisis.  Check LDH.  Initiate Dilaudid PCA with Toradol and IV fluids.  We will end up transfusing 1 unit of packed red blood cells.  Hydrate and monitor assess pain. ? ?#2 acute on chronic anemia: Suspect hemolytic crisis.  Will check reticulocyte count as well as LDH.  Continue with plan to transfuse 1 unit of packed red blood cells and monitor ? ?#3 chronic pain syndrome: We will resume oxycodone when ready. ? ?#4 leukocytosis: Secondary to vaso-occlusive crisis.  Continue to monitor ? ?#5 hyponatremia: We will hydrate and monitor ? ? ? Advance Care Planning:   Code Status: Prior full code ? ?Consults: None ? ?Family Communication: No family at bedside ? ?Severity of Illness: ?The appropriate patient status for this patient is INPATIENT. Inpatient status is judged to be reasonable and necessary in order to provide the required intensity of service to ensure the patient's safety. The patient's presenting symptoms, physical exam findings, and initial radiographic and laboratory data in the context of their chronic comorbidities is felt to place them at high risk for further clinical deterioration. Furthermore, it is not anticipated that the patient will be medically stable for discharge from the hospital within 2 midnights of admission.  ? ?* I certify that at the point of admission it is my clinical judgment that the patient will require inpatient hospital care spanning beyond 2 midnights from the point of admission due to high intensity of service, high risk for further deterioration and high frequency of surveillance required.* ? ?Author: ? , MD ?12/06/2021 5:18 PM ? ?For on call review [email or web address].  ?

## 2021-12-06 NOTE — ED Triage Notes (Signed)
Pt arrived via POV, c/o SCC, pain in left leg.  ?

## 2021-12-06 NOTE — ED Notes (Signed)
Blood bank has 2 units of blood ready for this patient. Notified Theatre manager. ?

## 2021-12-06 NOTE — Telephone Encounter (Signed)
Oxy Ibuprofen  

## 2021-12-06 NOTE — ED Provider Notes (Signed)
?[facility]-EMERGENCY DEPT ?Provider Note ? ? ?CSN: [id number] ?Arrival date & time: 12/06/21  1012 ? ?  ? ?History ? ?Chief Complaint  ?Patient presents with  ? Sickle Cell Pain Crisis  ? ? ?Jonathan [name] is a 21 y.o. male with a past medical history of sickle cell disease, asthma presenting to the ED with a chief complaint of left lower extremity pain. Symptoms have been intermittent for the past 5 days. He has attempted to control his symptoms at home as well as in the ER and the outpatient sickle cell clinic with only minimal improvement.  Attempted to take oxycodone and ibuprofen this morning without much improvement.  States the pain feels typical of his pain crises.  He denies any injury or trauma, weakness, numbness, chest pain, shortness of breath, abdominal pain, vomiting or fever. ? ?He attempted to get in contact with the outpatient clinic however stated that they did not have sufficient resources to treat pain today and was sent to the ED. ? ? ?Sickle Cell Pain Crisis ?Associated symptoms: no chest pain, no cough, no fever, no nausea, no shortness of breath, no sore throat, no vomiting and no wheezing   ? ?  ? ?Home Medications ?Prior to Admission medications   ?Medication Sig Start Date End Date Taking? Authorizing Provider  ?ergocalciferol (DRISDOL) 1.25 MG (50000 UT) capsule Take 1 capsule (50,000 Units total) by mouth once a week. ?Patient taking differently: Take 50,000 Units by mouth every 7 (seven) days. Sunday's 04/12/20   Barbette Merino, NP  ?folic acid (FOLVITE) 1 MG tablet Take 1 tablet (1 mg total) by mouth daily. TAKE 1 TABLET BY MOUTH DAILY Strength 1 mg ?Patient taking differently: Take 1 mg by mouth daily. 10/22/21   Massie Maroon, FNP  ?ibuprofen (ADVIL) 800 MG tablet Take 1 tablet (800 mg total) by mouth every 6 (six) hours as needed for moderate pain. 10/02/21   Massie Maroon, FNP  ?Oxycodone HCl 10 MG TABS Take 1 tablet (10 mg total) by mouth every 4 (four)  hours as needed (pain). 12/02/21   Kommor, Wyn Forster, MD  ?   ? ?Allergies    ?Allergy medication [diphenhydramine]   ? ?Review of Systems   ?Review of Systems  ?Constitutional:  Negative for appetite change, chills and fever.  ?HENT:  Negative for ear pain, rhinorrhea, sneezing and sore throat.   ?Eyes:  Negative for photophobia and visual disturbance.  ?Respiratory:  Negative for cough, chest tightness, shortness of breath and wheezing.   ?Cardiovascular:  Negative for chest pain and palpitations.  ?Gastrointestinal:  Negative for abdominal pain, blood in stool, constipation, diarrhea, nausea and vomiting.  ?Genitourinary:  Negative for dysuria, hematuria and urgency.  ?Musculoskeletal:  Positive for myalgias.  ?Skin:  Negative for rash.  ?Neurological:  Negative for dizziness, weakness and light-headedness.  ? ?Physical Exam ?Updated Vital Signs ?BP 136/77   Pulse (!) 112   Temp 99.6 ?F (37.6 ?C) (Oral)   Resp (!) 21   Ht 5\' 5"  (1.651 m)   SpO2 94%   BMI 24.96 kg/m?  ?Physical Exam ?Vitals and nursing note reviewed.  ?Constitutional:   ?   General: He is not in acute distress. ?   Appearance: He is well-developed.  ?HENT:  ?   Head: Normocephalic and atraumatic.  ?   Nose: Nose normal.  ?Eyes:  ?   General: No scleral icterus.    ?   Right eye: No discharge.     ?  Left eye: No discharge.  ?   Conjunctiva/sclera: Conjunctivae normal.  ?Cardiovascular:  ?   Rate and Rhythm: Normal rate and regular rhythm.  ?   Heart sounds: Normal heart sounds. No murmur heard. ?  No friction rub. No gallop.  ?Pulmonary:  ?   Effort: Pulmonary effort is normal. No respiratory distress.  ?   Breath sounds: Normal breath sounds.  ?Abdominal:  ?   General: Bowel sounds are normal. There is no distension.  ?   Palpations: Abdomen is soft.  ?   Tenderness: There is no abdominal tenderness. There is no guarding.  ?Musculoskeletal:     ?   General: Normal range of motion.  ?   Cervical back: Normal range of motion and neck supple.  ?    Comments: Diffuse tenderness of the left lower extremity without erythema, edema or warmth of joints.  No deformities noted.  2+ DP pulse palpated bilaterally.  No changes to range of motion of joints.  ?Skin: ?   General: Skin is warm and dry.  ?   Findings: No rash.  ?Neurological:  ?   Mental Status: He is alert.  ?   Motor: No abnormal muscle tone.  ?   Coordination: Coordination normal.  ? ? ?ED Results / Procedures / Treatments   ?Labs ?(all labs ordered are listed, but only abnormal results are displayed) ?Labs Reviewed  ?CBC WITH DIFFERENTIAL/PLATELET - Abnormal; Notable for the following components:  ?    Result Value  ? WBC 14.1 (*)   ? RBC 2.27 (*)   ? Hemoglobin 6.8 (*)   ? HCT 19.8 (*)   ? RDW 18.6 (*)   ? nRBC 1.9 (*)   ? Neutro Abs 10.1 (*)   ? Monocytes Absolute 1.8 (*)   ? All other components within normal limits  ?RETICULOCYTES - Abnormal; Notable for the following components:  ? Retic Ct Pct 6.5 (*)   ? RBC. 2.27 (*)   ? Immature Retic Fract 43.6 (*)   ? All other components within normal limits  ?COMPREHENSIVE METABOLIC PANEL - Abnormal; Notable for the following components:  ? Sodium 134 (*)   ? Glucose, Bld 120 (*)   ? Alkaline Phosphatase 129 (*)   ? Total Bilirubin 2.2 (*)   ? All other components within normal limits  ?PREPARE RBC (CROSSMATCH)  ?TYPE AND SCREEN  ? ? ?EKG ?None ? ?Radiology ?No results found. ? ?Procedures ?Marland KitchenCritical Care ?Performed by: Dietrich Pates, PA-C ?Authorized by: Dietrich Pates, PA-C  ? ?Critical care provider statement:  ?  Critical care time (minutes):  35 ?  Critical care time was exclusive of:  Separately billable procedures and treating other patients and teaching time ?  Critical care was necessary to treat or prevent imminent or life-threatening deterioration of the following conditions:  Circulatory failure, shock and CNS failure or compromise ?  Critical care was time spent personally by me on the following activities:  Development of treatment plan with  patient or surrogate, discussions with consultants, obtaining history from patient or surrogate, ordering and performing treatments and interventions, ordering and review of laboratory studies, pulse oximetry, re-evaluation of patient's condition and review of old charts ?  I assumed direction of critical care for this patient from another provider in my specialty: no   ?  Care discussed with: admitting provider and accepting provider at another facility    ? ? ?Medications Ordered in ED ?Medications  ?dextrose 5 %-0.45 % sodium chloride  infusion ( Intravenous New Bag/Given 12/06/21 1358)  ?0.9 %  sodium chloride infusion (has no administration in time range)  ?HYDROmorphone (DILAUDID) injection 2 mg (2 mg Intravenous Given 12/06/21 1359)  ?HYDROmorphone (DILAUDID) injection 2 mg (2 mg Intravenous Given 12/06/21 1430)  ?HYDROmorphone (DILAUDID) injection 2 mg (2 mg Intravenous Given 12/06/21 1502)  ?ondansetron (ZOFRAN) injection 4 mg (4 mg Intravenous Given 12/06/21 1407)  ?ketorolac (TORADOL) 30 MG/ML injection 15 mg (15 mg Intravenous Given 12/06/21 1502)  ? ? ?ED Course/ Medical Decision Making/ A&P ?Clinical Course as of 12/06/21 1540  ?Fri Dec 06, 2021  ?1530 Consult with sickle cell specialist Dr. Mikeal HawthorneGarba.  Reviewed patient's lab work.  He will admit the patient to his service but in the meantime we will transfuse 1 unit of blood and obtain LDH from previous sample. [HK]  ?1532 Retic Ct Pct(!): 6.5 [HK]  ?1538 Hemoglobin(!!): 6.8 [HK]  ?1538 Platelets: 309 [HK]  ?  ?Clinical Course User Index ?[HK] Idelle LeechKhatri, Nicanor Mendolia, PA-C  ? ?                        ?Medical Decision Making ?Amount and/or Complexity of Data Reviewed ?Labs: ordered. ? ?Risk ?Prescription drug management. ? ? ?21 year old male with past medical history of sickle cell anemia presenting to the ED with a chief complaint of left lower extremity pain related to sickle cell pain crisis.  He has had several visits this week in the ER as well as the outpatient pain  clinic with only temporary improvement.  States that the pain does feel typical of his pain crises but has not significantly improved with home oxycodone and ibuprofen.  He denies any injury or trauma.  On

## 2021-12-07 DIAGNOSIS — D5700 Hb-SS disease with crisis, unspecified: Principal | ICD-10-CM

## 2021-12-07 LAB — COMPREHENSIVE METABOLIC PANEL
ALT: 8 U/L (ref 0–44)
AST: 15 U/L (ref 15–41)
Albumin: 3.4 g/dL — ABNORMAL LOW (ref 3.5–5.0)
Alkaline Phosphatase: 109 U/L (ref 38–126)
Anion gap: 8 (ref 5–15)
BUN: 10 mg/dL (ref 6–20)
CO2: 30 mmol/L (ref 22–32)
Calcium: 8.8 mg/dL — ABNORMAL LOW (ref 8.9–10.3)
Chloride: 100 mmol/L (ref 98–111)
Creatinine, Ser: 0.7 mg/dL (ref 0.61–1.24)
GFR, Estimated: >60 mL/min (ref >60)
Glucose, Bld: 120 mg/dL — ABNORMAL HIGH (ref 70–99)
Potassium: 3.4 mmol/L — ABNORMAL LOW (ref 3.5–5.1)
Sodium: 138 mmol/L (ref 135–145)
Total Bilirubin: 1.1 mg/dL (ref 0.3–1.2)
Total Protein: 7.4 g/dL (ref 6.5–8.1)

## 2021-12-07 LAB — HIV ANTIBODY (ROUTINE TESTING W REFLEX): HIV Screen 4th Generation wRfx: NONREACTIVE

## 2021-12-07 LAB — CBC WITH DIFFERENTIAL/PLATELET
Abs Immature Granulocytes: 0.05 10*3/uL (ref 0.00–0.07)
Basophils Absolute: 0.1 10*3/uL (ref 0.0–0.1)
Basophils Relative: 1 %
Eosinophils Absolute: 0.3 10*3/uL (ref 0.0–0.5)
Eosinophils Relative: 3 %
HCT: 19.6 % — ABNORMAL LOW (ref 39.0–52.0)
Hemoglobin: 6.5 g/dL — CL (ref 13.0–17.0)
Immature Granulocytes: 0 %
Lymphocytes Relative: 21 %
Lymphs Abs: 2.5 10*3/uL (ref 0.7–4.0)
MCH: 29.5 pg (ref 26.0–34.0)
MCHC: 33.2 g/dL (ref 30.0–36.0)
MCV: 89.1 fL (ref 80.0–100.0)
Monocytes Absolute: 1.8 10*3/uL — ABNORMAL HIGH (ref 0.1–1.0)
Monocytes Relative: 15 %
Neutro Abs: 7 10*3/uL (ref 1.7–7.7)
Neutrophils Relative %: 60 %
Platelets: 315 10*3/uL (ref 150–400)
RBC: 2.2 MIL/uL — ABNORMAL LOW (ref 4.22–5.81)
RDW: 19.3 % — ABNORMAL HIGH (ref 11.5–15.5)
WBC: 11.6 10*3/uL — ABNORMAL HIGH (ref 4.0–10.5)
nRBC: 2.2 % — ABNORMAL HIGH (ref 0.0–0.2)

## 2021-12-07 LAB — PREPARE RBC (CROSSMATCH)

## 2021-12-07 MED ORDER — OXYCODONE HCL 5 MG PO TABS
10.0000 mg | ORAL_TABLET | ORAL | Status: DC | PRN
  Administered 2021-12-07 – 2021-12-08 (×2): 10 mg via ORAL
  Filled 2021-12-07 (×2): qty 2

## 2021-12-07 MED ORDER — ACETAMINOPHEN 325 MG PO TABS
650.0000 mg | ORAL_TABLET | Freq: Once | ORAL | Status: AC
Start: 2021-12-07 — End: 2021-12-07
  Administered 2021-12-07: 650 mg via ORAL
  Filled 2021-12-07: qty 2

## 2021-12-07 NOTE — Progress Notes (Signed)
Subjective: ?Jonathan [name] is a 21 year old male with a medical history significant for sickle cell disease that was admitted for sickle cell crisis. ?Today, patient's hemoglobin is 6.5 g/dL, which is below his baseline.  Patient denies any fatigue, headache, or shortness of breath today.  He continues to complain of pain primarily to upper and lower extremities.  He rates his pain as 7/10.  He says that he has not had very much pain relief today.  Patient has not ambulated.  He denies any urinary symptoms, nausea, vomiting, or diarrhea. ? ?Objective: ? ?Vital signs in last 24 hours: ? ?Vitals:  ? 12/07/21 1347 12/07/21 1445 12/07/21 1501 12/07/21 1645  ?BP: (!) 141/92 139/89 138/79 (!) 161/99  ?Pulse: 78 76 69 80  ?Resp: 16 16 16 16   ?Temp: 98.5 ?F (36.9 ?C) 97.8 ?F (36.6 ?C) 98.4 ?F (36.9 ?C) 98.1 ?F (36.7 ?C)  ?TempSrc: Oral Axillary Oral Oral  ?SpO2: 100% 100% 99% 99%  ?Weight:      ?Height:      ? ? ?Intake/Output from previous day: ? ? ?Intake/Output Summary (Last 24 hours) at 12/07/2021 1811 ?Last data filed at 12/07/2021 1648 ?Gross per 24 hour  ?Intake 3169.39 ml  ?Output --  ?Net 3169.39 ml  ? ? ?Physical Exam: ?General: Alert, awake, oriented x3, in no acute distress.  ?HEENT: [place]/AT PEERL, EOMI ?Neck: Trachea midline,  no masses, no thyromegal,y no JVD, no carotid bruit ?OROPHARYNX:  Moist, No exudate/ erythema/lesions.  ?Heart: Regular rate and rhythm, without murmurs, rubs, gallops, PMI non-displaced, no heaves or thrills on palpation.  ?Lungs: Clear to auscultation, no wheezing or rhonchi noted. No increased vocal fremitus resonant to percussion  ?Abdomen: Soft, nontender, nondistended, positive bowel sounds, no masses no hepatosplenomegaly noted.Marland Kitchen  ?Neuro: No focal neurological deficits noted cranial nerves II through XII grossly intact. DTRs 2+ bilaterally upper and lower extremities. Strength 5 out of 5 in bilateral upper and lower extremities. ?Musculoskeletal: No warm swelling or erythema around  joints, no spinal tenderness noted. ?Psychiatric: Patient alert and oriented x3, good insight and cognition, good recent to remote recall. ?Lymph node survey: No cervical axillary or inguinal lymphadenopathy noted. ? ?Lab Results: ? ?Basic Metabolic Panel: ?   ?Component Value Date/Time  ? NA 138 12/07/2021 0729  ? NA 139 08/06/2021 1030  ? K 3.4 (L) 12/07/2021 0729  ? CL 100 12/07/2021 0729  ? CO2 30 12/07/2021 0729  ? BUN 10 12/07/2021 0729  ? BUN 9 08/06/2021 1030  ? CREATININE 0.70 12/07/2021 0729  ? CREATININE 0.70 05/12/2016 1052  ? GLUCOSE 120 (H) 12/07/2021 0729  ? CALCIUM 8.8 (L) 12/07/2021 0729  ? ?CBC: ?   ?Component Value Date/Time  ? WBC 11.6 (H) 12/07/2021 0729  ? HGB 6.5 (LL) 12/07/2021 0729  ? HGB 7.6 (L) 08/06/2021 1030  ? HCT 19.6 (L) 12/07/2021 0729  ? HCT 23.6 (L) 08/06/2021 1030  ? PLT 315 12/07/2021 0729  ? PLT 958 ([place]) 08/06/2021 1030  ? MCV 89.1 12/07/2021 0729  ? MCV 96 08/06/2021 1030  ? NEUTROABS 7.0 12/07/2021 0729  ? NEUTROABS 3.4 08/06/2021 1030  ? LYMPHSABS 2.5 12/07/2021 0729  ? LYMPHSABS 2.4 08/06/2021 1030  ? MONOABS 1.8 (H) 12/07/2021 0729  ? EOSABS 0.3 12/07/2021 0729  ? EOSABS 0.2 08/06/2021 1030  ? BASOSABS 0.1 12/07/2021 0729  ? BASOSABS 0.1 08/06/2021 1030  ? ? ?No results found for this or any previous visit (from the past 240 hour(s)). ? ?Studies/Results: ?No results found. ? ?Medications: ?  Scheduled Meds: ? enoxaparin (LOVENOX) injection  40 mg Subcutaneous QHS  ? folic acid  1 mg Oral Daily  ? HYDROmorphone   Intravenous Q4H  ? ketorolac  15 mg Intravenous Q6H  ? senna-docusate  1 tablet Oral BID  ? ?Continuous Infusions: ? sodium chloride    ? dextrose 5 % and 0.45% NaCl 10 mL/hr at 12/07/21 1053  ? ?PRN Meds:.naloxone **AND** sodium chloride flush, ondansetron (ZOFRAN) IV, oxyCODONE, polyethylene glycol ? ?Consultants: ?None ? ?Procedures: ?none ? ?Antibiotics: ?None ? ?Assessment/Plan: ?Principal Problem: ?  Sickle cell disease with crisis ([place]) ?Active Problems: ?   Hypertension ?  Chronic obstruct airways disease ([place]) ?  Anemia of chronic disease ?  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[place]) ? ?Sickle cell disease with pain crisis: ?Decrease IV fluids to KVO ?Continue IV Dilaudid PCA, no changes in settings ?Toradol 15 mg IV every 6 hours for total of 5 days. ?Monitor vital signs very closely, reevaluate pain scale regularly, and supplemental oxygen as needed. ? ?Anemia of chronic disease: ?Today, patient's hemoglobin is 6.5 g/dL.  Transfuse 1 unit PRBCs.  Recheck CBC, LDH, and reticulocytes in AM. ? ?Chronic pain syndrome: ?Resume oxycodone 10 mg every 4 hours as needed for severe breakthrough pain ? ?Leukocytosis: ?More than likely secondary to vaso-occlusive crisis.  Patient afebrile.  No signs of acute infection.  Continue to monitor closely. ? ?Hyponatremia: ?Resolved.  Continue to monitor closely. ? ?Code Status: Full Code ?Family Communication: N/A ?Disposition Plan: Not yet ready for discharge ? ?[name]  APRN, MSN, FNP-C ?Patient [place] ?[facility] ?[address]  ?[place],  [address] ?[phone] ? ?If 7PM-7AM, please contact night-coverage. ? ?12/07/2021, 6:11 PM  LOS: 1 day   ?

## 2021-12-07 NOTE — TOC Initial Note (Signed)
Transition of Care (TOC) - Initial/Assessment Note  ? ? ?Patient Details  ?Name: Jonathan [name] ?MRN: [id number] ?Date of Birth: 01/06/2001 ? ?Transition of Care (TOC) CM/SW Contact:    ?Cecille Po, RN ?Phone Number: ?12/07/2021, 5:20 PM ? ?Clinical Narrative:                 ? ? ?Transition of Care Department (TOC) has reviewed patient and no TOC needs have been identified at this time. We will continue to monitor patient advancement through interdisciplinary progression rounds. If new patient transition needs arise, please place a TOC consult. ? ? ?Expected Discharge Plan: Home/Self Care ?Barriers to Discharge: Continued Medical Work up ? ? ? ?Expected Discharge Plan and Services ?Expected Discharge Plan: Home/Self Care ?  ?  ?Living arrangements for the past 2 months: Single Family Home ?                ?  ?Prior Living Arrangements/Services ?Living arrangements for the past 2 months: Single Family Home ?Lives with:: Roommate ?Patient language and need for interpreter reviewed:: Yes ?       ?Need for Family Participation in Patient Care: No (Comment) ?Care giver support system in place?: Yes (comment) ?  ?Criminal Activity/Legal Involvement Pertinent to Current Situation/Hospitalization: No - Comment as needed ? ?Activities of Daily Living ?Home Assistive Devices/Equipment: None ?ADL Screening (condition at time of admission) ?Patient's cognitive ability adequate to safely complete daily activities?: Yes ?Is the patient deaf or have difficulty hearing?: No ?Does the patient have difficulty seeing, even when wearing glasses/contacts?: No ?Does the patient have difficulty concentrating, remembering, or making decisions?: No ?Patient able to express need for assistance with ADLs?: Yes ?Does the patient have difficulty dressing or bathing?: No ?Independently performs ADLs?: Yes (appropriate for developmental age) ?Does the patient have difficulty walking or climbing stairs?: No ?Weakness of Legs: None ?Weakness  of Arms/Hands: None ? ? ?Emotional Assessment ?  ?Orientation: : Oriented to Self, Oriented to Place, Oriented to  Time, Oriented to Situation ?Alcohol / Substance Use: Not Applicable ?Psych Involvement: No (comment) ? ?Admission diagnosis:  Sickle cell disease with crisis (HCC) [D57.00] ?Sickle cell pain crisis (HCC) [D57.00] ?Anemia, unspecified type [D64.9] ?Patient Active Problem List  ? Diagnosis Date Noted  ? 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HCC) 09/02/2021  ? Sickle cell crisis (HCC) 07/27/2021  ? COVID-19 virus infection 07/27/2021  ? Anemia of chronic disease 06/27/2021  ? Thrombocytosis 06/27/2021  ? Mild intermittent asthma without complication 11/11/2020  ? Sickle cell disease with crisis (HCC) 10/23/2020  ? Sickle cell pain crisis (HCC) 08/16/2020  ? Proteinuria 04/14/2020  ? Influenza A 10/13/2017  ? Sickle cell disease (HCC) 04/27/2017  ? Failed hearing screening 03/25/2016  ? 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  ? Myopic astigmatism 03/16/2015  ? Avascular necrosis of bone of left hip (HCC) 03/24/2013  ? Chronic obstruct airways disease (HCC) 03/15/2013  ? Snoring 04/08/2012  ? Hypertension 11/11/2011  ? Reactive airway disease 11/09/2011  ? ?PCP:  Quentin Angst, MD ?Pharmacy:   ?[facility] [phone] - [place], [place] - [address] ?[address] ?[place] [address] ?Phone: [phone] Fax: [phone] ? ? ?Readmission Risk Interventions ? ?  12/07/2021  ?  5:19 PM 10/15/2021  ? 10:30 AM  ?Readmission Risk Prevention Plan  ?Transportation Screening Complete Complete  ?PCP or Specialist Appt within 5-7 Days Complete Complete  ?Home Care Screening Complete  Complete  ?Medication Review (RN CM)  Complete  ? ? ? ?

## 2021-12-08 ENCOUNTER — Encounter: Payer: Self-pay | Admitting: Family Medicine

## 2021-12-08 ENCOUNTER — Other Ambulatory Visit: Payer: Self-pay | Admitting: Family Medicine

## 2021-12-08 DIAGNOSIS — D571 Sickle-cell disease without crisis: Secondary | ICD-10-CM

## 2021-12-08 LAB — BPAM RBC
Blood Product Expiration Date: 202304262359
ISSUE DATE / TIME: 202304151435
Unit Type and Rh: 5100

## 2021-12-08 LAB — CBC WITH DIFFERENTIAL/PLATELET
Abs Immature Granulocytes: 0.08 10*3/uL — ABNORMAL HIGH (ref 0.00–0.07)
Basophils Absolute: 0.1 10*3/uL (ref 0.0–0.1)
Basophils Relative: 1 %
Eosinophils Absolute: 0.5 10*3/uL (ref 0.0–0.5)
Eosinophils Relative: 4 %
HCT: 25.7 % — ABNORMAL LOW (ref 39.0–52.0)
Hemoglobin: 8.5 g/dL — ABNORMAL LOW (ref 13.0–17.0)
Immature Granulocytes: 1 %
Lymphocytes Relative: 25 %
Lymphs Abs: 3 10*3/uL (ref 0.7–4.0)
MCH: 29.7 pg (ref 26.0–34.0)
MCHC: 33.1 g/dL (ref 30.0–36.0)
MCV: 89.9 fL (ref 80.0–100.0)
Monocytes Absolute: 1.5 10*3/uL — ABNORMAL HIGH (ref 0.1–1.0)
Monocytes Relative: 13 %
Neutro Abs: 6.9 10*3/uL (ref 1.7–7.7)
Neutrophils Relative %: 56 %
Platelets: 356 10*3/uL (ref 150–400)
RBC: 2.86 MIL/uL — ABNORMAL LOW (ref 4.22–5.81)
RDW: 19.6 % — ABNORMAL HIGH (ref 11.5–15.5)
WBC: 12.1 10*3/uL — ABNORMAL HIGH (ref 4.0–10.5)
nRBC: 3.8 % — ABNORMAL HIGH (ref 0.0–0.2)

## 2021-12-08 LAB — TYPE AND SCREEN
ABO/RH(D): O POS
Antibody Screen: NEGATIVE
Unit division: 0

## 2021-12-08 MED ORDER — OXYCODONE HCL 10 MG PO TABS: 10.0000 mg | ORAL_TABLET | ORAL | 0 refills | Status: DC | PRN

## 2021-12-08 NOTE — Discharge Summary (Signed)
?Physician Discharge Summary  ?Jonathan [name] [id number] DOB: [birth year] DOA: 12/06/2021 ? ?PCP: Tresa Garter, MD ? ?Admit date: 12/06/2021 ? ?Discharge date: 12/08/2021 ? ?Discharge Diagnoses:  ?Principal Problem: ?  Sickle cell disease with crisis ([place]) ?Active Problems: ?  Hypertension ?  Chronic obstruct airways disease ([place]) ?  Anemia of chronic disease ?  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[place]) ? ? ?Discharge Condition: Stable ? ?Disposition:  ? Follow-up Information   ? ? Tresa Garter, MD. Schedule an appointment as soon as possible for a visit .   ?Specialty: Internal Medicine ?Contact information: ?[address] ?[place] [address] ?[phone] ? ? ?  ?  ? ? Dorena Dew, FNP Follow up.   ?Specialty: Family Medicine ?Contact information: ?[address] ?[address] ?[place] [address] ?[phone] ? ? ?  ?  ? ?  ?  ? ?  ? ?Pt is discharged home in good condition and is to follow up with Tresa Garter, MD this week to have labs evaluated. Jonathan [name] is instructed to increase activity slowly and balance with rest for the next few days, and use prescribed medication to complete treatment of pain ? ?Diet: Regular ?Wt Readings from Last 3 Encounters:  ?12/07/21 67.4 kg  ?12/02/21 68 kg  ?10/23/21 65.2 kg  ? ? ?History of present illness:  ?Jonathan [name] is a 21 year old male with a medical history significant for sickle cell disease who presents to the ER with pain in his back and legs consistent with his typical sickle cell crisis.  Patient apparently has been dealing with this over the past week.  He was at the hospital once and twice to the ER this week.  Pain is getting better with treatment in the ER, then has been reoccurring upon returning home.  Currently, patient rates pain 8/10.  It is allover, but more in hips and arms.  Patient has a history of avascular necrosis of the left hip.  He was in the ER today, where he got multiple doses of Dilaudid.  Pain is  improved, but still not at goal.  Additionally, patient's hemoglobin was noted to drop from 12.2 to 6.8 over the past 4 days.  His baseline is somewhere between 10 and 11 g/dL.  Patient is therefore being admitted to the hospital for further evaluation and treatment.  There is worried that patient may be experiencing hemolytic crisis.  His LDH was 669 2 days prior.  Also, noted to have leukocytosis. ? ?Hospital Course:  ?Sickle cell disease with pain crisis: ?Patient was admitted for sickle cell pain crisis and managed appropriately with IVF, IV Dilaudid via PCA and IV Toradol, as well as other adjunct therapies per sickle cell pain management protocols.  IV Dilaudid PCA weaned appropriately and patient transition to his home medications.  Oxycodone 10 mg #60 was sent to patient's pharmacy.  PDMP was reviewed prior to prescribing opiate medications, no inconsistencies noted. ?Anemia of chronic disease: ?Hemoglobin decreased to 6.5 g/dL.  Patient was transfused 1 unit PRBCs.  Prior to discharge, hemoglobin is 8.5 g/dL.  Patient will return to clinic on 12/10/2021 for Adakveo infusion, which she receives monthly. ? ?Patient is alert, oriented, and ambulating without assistance.  He is requesting discharge home.  Pain intensity is 3/10. ?Patient was therefore discharged home today in a hemodynamically stable condition.  ? ?[place] will follow-up with PCP within 1 week of this discharge. Robt was counseled extensively about nonpharmacologic means of  pain management, patient verbalized understanding and was appreciative of  the care received during this admission.  ? ?We discussed the need for good hydration, monitoring of hydration status, avoidance of heat, cold, stress, and infection triggers. We discussed the need to be adherent with taking Hydrea and other home medications. Patient was reminded of the need to seek medical attention immediately if any symptom of bleeding, anemia, or infection occurs. ? ?Discharge  Exam: ?Vitals:  ? 12/08/21 1132 12/08/21 1143  ?BP: 128/80   ?Pulse: 89   ?Resp: 18 13  ?Temp: (!) 97.4 ?F (36.3 ?C)   ?SpO2: 100% 96%  ? ?Vitals:  ? 12/08/21 0402 12/08/21 0628 12/08/21 1132 12/08/21 1143  ?BP:  (!) 149/81 128/80   ?Pulse:  73 89   ?Resp: 16 18 18 13   ?Temp:  98.2 ?F (36.8 ?C) (!) 97.4 ?F (36.3 ?C)   ?TempSrc:  Oral Oral   ?SpO2: 100% 96% 100% 96%  ?Weight:      ?Height:      ? ? ?General appearance : Awake, alert, not in any distress. Speech Clear. Not toxic looking ?HEENT: Atraumatic and Normocephalic, pupils equally reactive to light and accomodation ?Neck: Supple, no JVD. No cervical lymphadenopathy.  ?Chest: Good air entry bilaterally, no added sounds  ?CVS: S1 S2 regular, no murmurs.  ?Abdomen: Bowel sounds present, Non tender and not distended with no gaurding, rigidity or rebound. ?Extremities: B/L Lower Ext shows no edema, both legs are warm to touch ?Neurology: Awake alert, and oriented X 3, CN II-XII intact, Non focal ?Skin: No Rash ? ?Discharge Instructions ? ?Discharge Instructions   ? ? Discharge patient   Complete by: As directed ?  ? Discharge disposition: 01-Home or Self Care  ? Discharge patient date: 12/08/2021  ? ?  ? ?Allergies as of 12/08/2021   ? ?   Reactions  ? Allergy Medication [diphenhydramine] Swelling, Other (See Comments)  ? Face swells  ? ?  ? ?  ?Medication List  ?  ? ?TAKE these medications   ? ?ergocalciferol 1.25 MG (50000 UT) capsule ?Commonly known as: Drisdol ?Take 1 capsule (50,000 Units total) by mouth once a week. ?  ?folic acid 1 MG tablet ?Commonly known as: FOLVITE ?Take 1 tablet (1 mg total) by mouth daily. TAKE 1 TABLET BY MOUTH DAILY Strength 1 mg ?What changed: additional instructions ?  ?ibuprofen 800 MG tablet ?Commonly known as: ADVIL ?Take 1 tablet (800 mg total) by mouth every 6 (six) hours as needed for moderate pain. ?  ?Oxycodone HCl 10 MG Tabs ?Take 1 tablet (10 mg total) by mouth every 4 (four) hours as needed (pain). ?  ? ?  ? ? ?The results  of significant diagnostics from this hospitalization (including imaging, microbiology, ancillary and laboratory) are listed below for reference.   ? ?Significant Diagnostic Studies: ?No results found. ? ?Microbiology: ?No results found for this or any previous visit (from the past 240 hour(s)).  ? ?Labs: ?Basic Metabolic Panel: ?Recent Labs  ?Lab 12/01/21 ?1650 12/02/21 ?0932 12/03/21 ?1005 12/06/21 ?1344 12/07/21 ?0729  ?NA 140 138 138 134* 138  ?K 3.9 3.4* 3.3* 3.7 3.4*  ?CL 106 103 103 99 100  ?CO2 28 25 27 26 30   ?GLUCOSE 128* 112* 117* 120* 120*  ?BUN 6 10 11 13 10   ?CREATININE 0.74 0.59* 0.76 0.90 0.70  ?CALCIUM 9.7 9.5 9.1 8.9 8.8*  ? ?Liver Function Tests: ?Recent Labs  ?Lab 12/02/21 ?0932 12/03/21 ?1005 12/06/21 ?1344 12/07/21 ?0729  ?  AST 38 34 19 15  ?ALT 12 13 10 8   ?ALKPHOS 129* 168* 129* 109  ?BILITOT 3.0* 3.6* 2.2* 1.1  ?PROT 7.9 7.6 7.8 7.4  ?ALBUMIN 4.8 4.2 3.7 3.4*  ? ?No results for input(s): LIPASE, AMYLASE in the last 168 hours. ?No results for input(s): AMMONIA in the last 168 hours. ?CBC: ?Recent Labs  ?Lab 12/01/21 ?1650 12/02/21 ?0932 12/06/21 ?1344 12/07/21 ?0729 12/08/21 ?1201  ?WBC 13.1* 14.8* 14.1* 11.6* 12.1*  ?NEUTROABS 8.1* 11.1* 10.1* 7.0 6.9  ?HGB 11.5* 12.2* 6.8* 6.5* 8.5*  ?HCT 33.6* 34.1* 19.8* 19.6* 25.7*  ?MCV 91.1 89.5 87.2 89.1 89.9  ?PLT 402* 382 309 315 356  ? ?Cardiac Enzymes: ?No results for input(s): CKTOTAL, CKMB, CKMBINDEX, TROPONINI in the last 168 hours. ?BNP: ?Invalid input(s): POCBNP ?CBG: ?No results for input(s): GLUCAP in the last 168 hours. ? ?Time coordinating discharge: 30 minutes ? ?Signed: ? ?[name]  APRN, MSN, FNP-C ?[facility] ?[address]  ?[place], [place] [address] ?[phone] ? ?[facility] ?12/08/2021, 2:05 PM  ?

## 2021-12-10 ENCOUNTER — Other Ambulatory Visit: Payer: Self-pay | Admitting: Internal Medicine

## 2021-12-10 DIAGNOSIS — D571 Sickle-cell disease without crisis: Secondary | ICD-10-CM

## 2021-12-10 DIAGNOSIS — Z79891 Long term (current) use of opiate analgesic: Secondary | ICD-10-CM

## 2021-12-10 MED ORDER — IBUPROFEN 800 MG PO TABS: 800.0000 mg | ORAL_TABLET | Freq: Four times a day (QID) | ORAL | 2 refills | Status: DC | PRN

## 2022-01-27 ENCOUNTER — Telehealth: Payer: Self-pay

## 2022-01-27 NOTE — Telephone Encounter (Signed)
Ibuprofen Oxycodone     

## 2022-01-28 ENCOUNTER — Telehealth (HOSPITAL_COMMUNITY): Payer: Self-pay | Admitting: *Deleted

## 2022-01-28 ENCOUNTER — Non-Acute Institutional Stay (HOSPITAL_COMMUNITY)
Admission: AD | Admit: 2022-01-28 | Discharge: 2022-01-28 | Disposition: A | Discharge status: Home / Self Care | Payer: Commercial Managed Care - PPO | Source: Ambulatory Visit | Attending: Internal Medicine | Admitting: Internal Medicine

## 2022-01-28 DIAGNOSIS — D5700 Hb-SS disease with crisis, unspecified: Secondary | ICD-10-CM | POA: Diagnosis present

## 2022-01-28 DIAGNOSIS — F1120 Opioid dependence, uncomplicated: Secondary | ICD-10-CM | POA: Diagnosis not present

## 2022-01-28 DIAGNOSIS — G894 Chronic pain syndrome: Secondary | ICD-10-CM | POA: Diagnosis not present

## 2022-01-28 DIAGNOSIS — D571 Sickle-cell disease without crisis: Secondary | ICD-10-CM

## 2022-01-28 DIAGNOSIS — D638 Anemia in other chronic diseases classified elsewhere: Secondary | ICD-10-CM | POA: Diagnosis not present

## 2022-01-28 LAB — CBC WITH DIFFERENTIAL/PLATELET
Abs Immature Granulocytes: 0.07 10*3/uL (ref 0.00–0.07)
Basophils Absolute: 0.1 10*3/uL (ref 0.0–0.1)
Basophils Relative: 1 %
Eosinophils Absolute: 0.1 10*3/uL (ref 0.0–0.5)
Eosinophils Relative: 1 %
HCT: 32.2 % — ABNORMAL LOW (ref 39.0–52.0)
Hemoglobin: 10.9 g/dL — ABNORMAL LOW (ref 13.0–17.0)
Immature Granulocytes: 1 %
Lymphocytes Relative: 20 %
Lymphs Abs: 2.2 10*3/uL (ref 0.7–4.0)
MCH: 31 pg (ref 26.0–34.0)
MCHC: 33.9 g/dL (ref 30.0–36.0)
MCV: 91.5 fL (ref 80.0–100.0)
Monocytes Absolute: 1.2 10*3/uL — ABNORMAL HIGH (ref 0.1–1.0)
Monocytes Relative: 11 %
Neutro Abs: 7.2 10*3/uL (ref 1.7–7.7)
Neutrophils Relative %: 66 %
Platelets: 428 10*3/uL — ABNORMAL HIGH (ref 150–400)
RBC: 3.52 MIL/uL — ABNORMAL LOW (ref 4.22–5.81)
RDW: 19 % — ABNORMAL HIGH (ref 11.5–15.5)
WBC: 10.9 10*3/uL — ABNORMAL HIGH (ref 4.0–10.5)
nRBC: 7.6 % — ABNORMAL HIGH (ref 0.0–0.2)

## 2022-01-28 LAB — COMPREHENSIVE METABOLIC PANEL
ALT: 10 U/L (ref 0–44)
AST: 22 U/L (ref 15–41)
Albumin: 4.5 g/dL (ref 3.5–5.0)
Alkaline Phosphatase: 121 U/L (ref 38–126)
Anion gap: 9 (ref 5–15)
BUN: 8 mg/dL (ref 6–20)
CO2: 23 mmol/L (ref 22–32)
Calcium: 9.6 mg/dL (ref 8.9–10.3)
Chloride: 109 mmol/L (ref 98–111)
Creatinine, Ser: 0.82 mg/dL (ref 0.61–1.24)
GFR, Estimated: >60 mL/min (ref >60)
Glucose, Bld: 80 mg/dL (ref 70–99)
Potassium: 3.7 mmol/L (ref 3.5–5.1)
Sodium: 141 mmol/L (ref 135–145)
Total Bilirubin: 2 mg/dL — ABNORMAL HIGH (ref 0.3–1.2)
Total Protein: 7.6 g/dL (ref 6.5–8.1)

## 2022-01-28 LAB — RETICULOCYTES
Immature Retic Fract: 41.5 % — ABNORMAL HIGH (ref 2.3–15.9)
RBC.: 3.48 MIL/uL — ABNORMAL LOW (ref 4.22–5.81)
Retic Count, Absolute: 254 10*3/uL — ABNORMAL HIGH (ref 19.0–186.0)
Retic Ct Pct: 7.3 % — ABNORMAL HIGH (ref 0.4–3.1)

## 2022-01-28 MED ORDER — SODIUM CHLORIDE 0.45 % IV SOLN: INTRAVENOUS | Status: DC

## 2022-01-28 MED ORDER — ACETAMINOPHEN 500 MG PO TABS
1000.0000 mg | ORAL_TABLET | Freq: Once | ORAL | Status: DC
Start: 2022-01-28 — End: 2022-01-28

## 2022-01-28 MED ORDER — NALOXONE HCL 0.4 MG/ML IJ SOLN: 0.4000 mg | INTRAMUSCULAR | Status: DC | PRN

## 2022-01-28 MED ORDER — KETOROLAC TROMETHAMINE 30 MG/ML IJ SOLN
15.0000 mg | Freq: Once | INTRAMUSCULAR | Status: AC
  Administered 2022-01-28: 15 mg via INTRAVENOUS
  Filled 2022-01-28: qty 1

## 2022-01-28 MED ORDER — SODIUM CHLORIDE 0.9% FLUSH: 9.0000 mL | INTRAVENOUS | Status: DC | PRN

## 2022-01-28 MED ORDER — ONDANSETRON HCL 4 MG/2ML IJ SOLN
4.0000 mg | Freq: Four times a day (QID) | INTRAMUSCULAR | Status: DC | PRN
  Administered 2022-01-28: 4 mg via INTRAVENOUS
  Filled 2022-01-28: qty 2

## 2022-01-28 MED ORDER — SODIUM CHLORIDE 0.9 % IV SOLN
5.0000 mg/kg | Freq: Once | INTRAVENOUS | Status: AC
  Administered 2022-01-28: 337 mg via INTRAVENOUS
  Filled 2022-01-28: qty 33.7

## 2022-01-28 MED ORDER — SODIUM CHLORIDE 0.9 % IV SOLN: INTRAVENOUS | Status: DC | PRN

## 2022-01-28 MED ORDER — HYDROMORPHONE 1 MG/ML IV SOLN
INTRAVENOUS | Status: DC
  Administered 2022-01-28: 6 mg via INTRAVENOUS
  Administered 2022-01-28: 30 mg via INTRAVENOUS
  Filled 2022-01-28: qty 30

## 2022-01-28 NOTE — Discharge Summary (Signed)
[facility] Discharge Summary   Patient ID: Jonathan [name] MRN: [id number] DOB/AGE: 23/03/2001 23 y.o.  Admit date: 01/28/2022 Discharge date: 01/28/2022  Primary Care Physician:  Tresa Garter, MD  Admission Diagnoses:  Active Problems:   Sickle cell pain crisis [facility])   Discharge Medications:  Allergies as of 01/28/2022       Reactions   Allergy Medication [diphenhydramine] Swelling, Other (See Comments)   Face swells        Medication List     TAKE these medications    ergocalciferol 1.25 MG (50000 UT) capsule Commonly known as: Drisdol Take 1 capsule (50,000 Units total) by mouth once a week.   folic acid 1 MG tablet Commonly known as: FOLVITE Take 1 tablet (1 mg total) by mouth daily. TAKE 1 TABLET BY MOUTH DAILY Strength 1 mg What changed: additional instructions   ibuprofen 800 MG tablet Commonly known as: ADVIL Take 1 tablet (800 mg total) by mouth every 6 (six) hours as needed for moderate pain.   Oxycodone HCl 10 MG Tabs Take 1 tablet (10 mg total) by mouth every 4 (four) hours as needed (pain).         Consults:  None  Significant Diagnostic Studies:  No results found.  History of present illness:  Jonathan [name] is a 21 year old male with a medical history significant for sickle cell disease, chronic pain syndrome, opiate dependence and tolerance, and history of anemia of chronic disease presents with complaints of low back and lower extremity pain that is consistent with his previous sickle cell crisis.  Patient has not identified any provocative factors concerning crisis.  He says that he he was in his usual state of health and awakened yesterday with intense pain.  He rates his pain as 9/10, constant, and throbbing.  He last had oxycodone this a.m. without any relief.  He denies any fever, chills, chest pain, headache, urinary symptoms, nausea, vomiting, or diarrhea.  No sick contacts, recent travel, or known exposure to  COVID-19.  [facility] Course: Patient admitted to sickle cell day infusion clinic for management of pain crisis.  Reviewed all laboratory values, largely consistent with patient's baseline. Pain managed with IV Dilaudid PCA IV fluids, 0.45% saline at 100 mL/h Toradol 15 mg IV x1 Tylenol 1000 mg by mouth x1 Patient's pain intensity decreased to 2/10 and he is requesting discharge home. Also, patient received Adakveo infusion today. He is alert, oriented, and ambulating without assistance.  He will discharge home in a hemodynamically stable condition.  Discharge instructions: Resume all home medications.   Follow up with PCP as previously  scheduled.   Discussed the importance of drinking 64 ounces of water daily, dehydration of red blood cells may lead further sickling.   Avoid all stressors that precipitate sickle cell pain crisis.     The patient was given clear instructions to go to ER or return to medical center if symptoms do not improve, worsen or new problems develop.    Physical Exam at Discharge:  BP 137/86 (BP Location: Left Arm)   Pulse 70   Temp 97.9 F (36.6 C) (Temporal)   Resp 15   SpO2 95%  Physical Exam Constitutional:      Appearance: Normal appearance.  Eyes:     Pupils: Pupils are equal, round, and reactive to light.  Cardiovascular:     Rate and Rhythm: Normal rate and regular rhythm.     Pulses: Normal pulses.  Pulmonary:  Effort: Pulmonary effort is normal.  Abdominal:     General: Bowel sounds are normal.  Musculoskeletal:        General: Normal range of motion.  Skin:    General: Skin is warm.  Neurological:     General: No focal deficit present.     Mental Status: He is alert. Mental status is at baseline.  Psychiatric:        Mood and Affect: Mood normal.        Behavior: Behavior normal.        Thought Content: Thought content normal.        Judgment: Judgment normal.    Disposition at Discharge: Discharge  disposition: 01-Home or Self Care       Discharge Orders: Discharge Instructions     Discharge patient   Complete by: As directed    Discharge disposition: 01-Home or Self Care   Discharge patient date: 01/28/2022       Condition at Discharge:   Stable  Time spent on Discharge:  Greater than 30 minutes.  Signed: [name]  APRN, MSN, FNP-C [facility] [address] [place], [place] [address] [phone]  01/28/2022, 4:27 PM

## 2022-01-28 NOTE — Progress Notes (Signed)
Pt admitted to the day hospital for treatment of sickle cell pain crisis. Pt reported pain to back rated 7/10. Pt given IV toradol and zofran, placed on Dilaudid PCA 0.5/10/3, and hydrated with IV fluids. Pt also received  monthly IV Adakveo infusion as ordered by [place] Hollis FNP, tolerated well. No premeds required.  At discharge patient reported pain a 2/10. Pt provided with AVS and instructed to schedule next adakveo infusion in 1 month at front desk prior to leaving, pt verbalized understanding.  Pt alert , oriented and ambulatory at discharge.

## 2022-01-28 NOTE — H&P (Signed)
[facility] History and Physical   Date: 01/28/2022  Patient name: Jonathan [name] Medical record number: [id number] Date of birth: Feb 12, 2001 Age: 21 y.o. Gender: male PCP: Quentin Angst, MD  Attending physician: Quentin Angst, MD  Chief Complaint: Sickle cell pain   History of Present Illness: Jonathan [name] is a 21 year old male with a medical history significant for sickle cell disease, chronic pain syndrome, opiate dependence and tolerance, and history of anemia of chronic disease presents with complaints of low back and lower extremity pain that is consistent with his previous sickle cell crisis.  Patient has not identified any provocative factors concerning crisis.  He says that he he was in his usual state of health and awakened yesterday with intense pain.  He rates his pain as 9/10, constant, and throbbing.  He last had oxycodone this a.m. without any relief.  He denies any fever, chills, chest pain, headache, urinary symptoms, nausea, vomiting, or diarrhea.  No sick contacts, recent travel, or known exposure to COVID-19.  Meds: Medications Prior to Admission  Medication Sig Dispense Refill Last Dose   ergocalciferol (DRISDOL) 1.25 MG (50000 UT) capsule Take 1 capsule (50,000 Units total) by mouth once a week. 30 capsule 2 01/27/2022   folic acid (FOLVITE) 1 MG tablet Take 1 tablet (1 mg total) by mouth daily. TAKE 1 TABLET BY MOUTH DAILY Strength 1 mg (Patient taking differently: Take 1 mg by mouth daily.) 90 tablet 3 01/27/2022   ibuprofen (ADVIL) 800 MG tablet Take 1 tablet (800 mg total) by mouth every 6 (six) hours as needed for moderate pain. 30 tablet 2 01/28/2022   Oxycodone HCl 10 MG TABS Take 1 tablet (10 mg total) by mouth every 4 (four) hours as needed (pain). 60 tablet 0 Past Month    Allergies: Allergy medication [diphenhydramine] Past Medical History:  Diagnosis Date   Acute chest syndrome(517.3) 2013   march 2013, Sept 2013   Asthma     Avascular necrosis of bone of left hip ([facility])    Hb-SS disease with vaso-occlusive crisis ([facility]) 07/28/2011   Sickle cell disease with crisis ([facility])    Sickle cell disease, type SS ([facility])    Sickle cell pain crisis ([facility]) 10/02/2012   Vaso-occlusive sickle cell crisis ([facility]) 11/09/2011   Past Surgical History:  Procedure Laterality Date   ADENOIDECTOMY     SPLENECTOMY     TONSILLECTOMY     TONSILLECTOMY AND ADENOIDECTOMY     TOTAL HIP ARTHROPLASTY Left 03/07/2016   UMBILICAL HERNIA REPAIR     Family History  Problem Relation Age of Onset   Hypertension Mother    Hypertension Maternal Grandmother    Hypertension Maternal Grandfather    Asthma Paternal Grandfather    Diabetes Paternal Grandfather    Social History   Socioeconomic History   Marital status: Single    Spouse name: Not on file   Number of children: Not on file   Years of education: Not on file   Highest education level: Not on file  Occupational History   Not on file  Tobacco Use   Smoking status: Never   Smokeless tobacco: Never  Vaping Use   Vaping Use: Never used  Substance and Sexual Activity   Alcohol use: No    Alcohol/week: 0.0 standard drinks   Drug use: No   Sexual activity: Never  Other Topics Concern   Not on file  Social History Narrative   Lives with mother.  No smokers at  home      Update:       05/02/16 Lives with Mother only   Social Determinants of Health   Financial Resource Strain: Not on file  Food Insecurity: Not on file  Transportation Needs: Not on file  Physical Activity: Not on file  Stress: Not on file  Social Connections: Not on file  Intimate Partner Violence: Not on file   Review of Systems  Constitutional:  Negative for chills and fever.  HENT: Negative.    Eyes: Negative.   Respiratory: Negative.    Cardiovascular: Negative.   Genitourinary: Negative.   Musculoskeletal:  Positive for back pain and joint pain.  Skin: Negative.   Neurological: Negative.    Psychiatric/Behavioral: Negative.     Physical Exam: Blood pressure (!) 145/95, pulse 85, temperature 99.1 F (37.3 C), temperature source Temporal, resp. rate 16, SpO2 99 %. Physical Exam Constitutional:      Appearance: Normal appearance.  Eyes:     Pupils: Pupils are equal, round, and reactive to light.  Cardiovascular:     Rate and Rhythm: Normal rate and regular rhythm.  Pulmonary:     Effort: Pulmonary effort is normal.  Abdominal:     General: Bowel sounds are normal.  Musculoskeletal:        General: Normal range of motion.  Skin:    General: Skin is warm.  Neurological:     General: No focal deficit present.     Mental Status: He is alert. Mental status is at baseline.  Psychiatric:        Mood and Affect: Mood normal.        Behavior: Behavior normal.        Thought Content: Thought content normal.        Judgment: Judgment normal.    Lab results: No results found for this or any previous visit (from the past 24 hour(s)).  Imaging results:  No results found.   Assessment & Plan: Patient admitted to sickle cell day infusion center for management of pain crisis.  Patient is opiate naive Initiate IV dilaudid PCA.  IV fluids, 0.45% saline at 100 ml/hr Toradol 15 mg IV times one dose Tylenol 1000 mg by mouth times one dose Review CBC with differential, complete metabolic panel, and reticulocytes as results become available.  Pain intensity will be reevaluated in context of functioning and relationship to baseline as care progresses If pain intensity remains elevated and/or sudden change in hemodynamic stability transition to inpatient services for higher level of care.      [name]  APRN, MSN, FNP-C [facility] [address] [place], [place] [address] [phone]  01/28/2022, 10:02 AM

## 2022-01-28 NOTE — Telephone Encounter (Signed)
Patient called requesting to come to the day hospital for sickle cell pain. Patient reports lower back pain rated 7/10. Reports taking Ibuprofen at 4:00 am and reports being out of Oxycodone. COVID-19 screening done and patient denies all symptoms and exposures. Denies fever, chest pain, nausea, vomiting, diarrhea, abdominal pain and priapism. Admits to having transportation without driving self. Patient's roommate will provide transportation. [place], FNP notified. Patient can come to the day hospital for pain management. Patient advised and expresses an understanding.

## 2022-01-29 ENCOUNTER — Encounter (HOSPITAL_COMMUNITY): Payer: Commercial Managed Care - PPO

## 2022-02-10 ENCOUNTER — Other Ambulatory Visit: Payer: Self-pay | Admitting: Family Medicine

## 2022-02-10 ENCOUNTER — Telehealth: Payer: Self-pay

## 2022-02-10 DIAGNOSIS — D571 Sickle-cell disease without crisis: Secondary | ICD-10-CM

## 2022-02-10 DIAGNOSIS — Z79891 Long term (current) use of opiate analgesic: Secondary | ICD-10-CM

## 2022-02-10 MED ORDER — OXYCODONE HCL 10 MG PO TABS: 10.0000 mg | ORAL_TABLET | ORAL | 0 refills | Status: DC | PRN

## 2022-02-10 NOTE — Progress Notes (Signed)
Reviewed PDMP substance reporting system prior to prescribing opiate medications. No inconsistencies noted.   Meds ordered this encounter  Medications  . Oxycodone HCl 10 MG TABS    Sig: Take 1 tablet (10 mg total) by mouth every 4 (four) hours as needed (pain).    Dispense:  60 tablet    Refill:  0    Order Specific Question:   Supervising Provider    Answer:   JEGEDE, OLUGBEMIGA E [[id number]]     Jonathan [name] Jonathan Lanique Gonzalo  APRN, MSN, FNP-C [facility] [address]  [place], [place] [address] [phone]  

## 2022-02-10 NOTE — Telephone Encounter (Signed)
Oxycodone  °

## 2022-02-11 ENCOUNTER — Encounter (HOSPITAL_COMMUNITY): Payer: Self-pay | Admitting: Family Medicine

## 2022-02-11 ENCOUNTER — Telehealth (HOSPITAL_COMMUNITY): Payer: Self-pay | Admitting: General Practice

## 2022-02-11 ENCOUNTER — Emergency Department (HOSPITAL_COMMUNITY): Payer: Commercial Managed Care - PPO

## 2022-02-11 ENCOUNTER — Other Ambulatory Visit: Payer: Self-pay

## 2022-02-11 ENCOUNTER — Encounter (HOSPITAL_COMMUNITY): Payer: Self-pay

## 2022-02-11 ENCOUNTER — Inpatient Hospital Stay (HOSPITAL_COMMUNITY)
Admission: EM | Admit: 2022-02-11 | Discharge: 2022-02-17 | DRG: 811 | Disposition: A | Discharge status: Home / Self Care | Payer: Commercial Managed Care - PPO | Attending: Internal Medicine | Admitting: Internal Medicine

## 2022-02-11 ENCOUNTER — Non-Acute Institutional Stay (HOSPITAL_BASED_OUTPATIENT_CLINIC_OR_DEPARTMENT_OTHER)
Admission: AD | Admit: 2022-02-11 | Discharge: 2022-02-11 | Disposition: A | Discharge status: Home / Self Care | Payer: Commercial Managed Care - PPO | Source: Ambulatory Visit | Attending: Internal Medicine | Admitting: Internal Medicine

## 2022-02-11 DIAGNOSIS — D638 Anemia in other chronic diseases classified elsewhere: Secondary | ICD-10-CM | POA: Diagnosis present

## 2022-02-11 DIAGNOSIS — F1120 Opioid dependence, uncomplicated: Secondary | ICD-10-CM | POA: Diagnosis present

## 2022-02-11 DIAGNOSIS — J9811 Atelectasis: Secondary | ICD-10-CM | POA: Diagnosis present

## 2022-02-11 DIAGNOSIS — D5700 Hb-SS disease with crisis, unspecified: Secondary | ICD-10-CM | POA: Insufficient documentation

## 2022-02-11 DIAGNOSIS — G894 Chronic pain syndrome: Secondary | ICD-10-CM | POA: Insufficient documentation

## 2022-02-11 DIAGNOSIS — Z825 Family history of asthma and other chronic lower respiratory diseases: Secondary | ICD-10-CM

## 2022-02-11 DIAGNOSIS — Z79899 Other long term (current) drug therapy: Secondary | ICD-10-CM

## 2022-02-11 DIAGNOSIS — Z79891 Long term (current) use of opiate analgesic: Secondary | ICD-10-CM

## 2022-02-11 DIAGNOSIS — Z96642 Presence of left artificial hip joint: Secondary | ICD-10-CM | POA: Diagnosis present

## 2022-02-11 DIAGNOSIS — Z888 Allergy status to other drugs, medicaments and biological substances status: Secondary | ICD-10-CM

## 2022-02-11 DIAGNOSIS — J45909 Unspecified asthma, uncomplicated: Secondary | ICD-10-CM | POA: Diagnosis present

## 2022-02-11 DIAGNOSIS — J189 Pneumonia, unspecified organism: Secondary | ICD-10-CM

## 2022-02-11 DIAGNOSIS — R911 Solitary pulmonary nodule: Secondary | ICD-10-CM

## 2022-02-11 DIAGNOSIS — D571 Sickle-cell disease without crisis: Secondary | ICD-10-CM

## 2022-02-11 LAB — CBC WITH DIFFERENTIAL/PLATELET
Abs Immature Granulocytes: 0.48 10*3/uL — ABNORMAL HIGH (ref 0.00–0.07)
Basophils Absolute: 0.2 10*3/uL — ABNORMAL HIGH (ref 0.0–0.1)
Basophils Relative: 1 %
Eosinophils Absolute: 0.2 10*3/uL (ref 0.0–0.5)
Eosinophils Relative: 1 %
HCT: 31.2 % — ABNORMAL LOW (ref 39.0–52.0)
Hemoglobin: 10.2 g/dL — ABNORMAL LOW (ref 13.0–17.0)
Immature Granulocytes: 3 %
Lymphocytes Relative: 22 %
Lymphs Abs: 3.3 10*3/uL (ref 0.7–4.0)
MCH: 29.1 pg (ref 26.0–34.0)
MCHC: 32.7 g/dL (ref 30.0–36.0)
MCV: 89.1 fL (ref 80.0–100.0)
Monocytes Absolute: 1.2 10*3/uL — ABNORMAL HIGH (ref 0.1–1.0)
Monocytes Relative: 8 %
Neutro Abs: 9.3 10*3/uL — ABNORMAL HIGH (ref 1.7–7.7)
Neutrophils Relative %: 65 %
Platelets: 304 10*3/uL (ref 150–400)
RBC: 3.5 MIL/uL — ABNORMAL LOW (ref 4.22–5.81)
RDW: 20.1 % — ABNORMAL HIGH (ref 11.5–15.5)
WBC: 14.6 10*3/uL — ABNORMAL HIGH (ref 4.0–10.5)
nRBC: 14.1 % — ABNORMAL HIGH (ref 0.0–0.2)

## 2022-02-11 LAB — RETICULOCYTES
Immature Retic Fract: 42.2 % — ABNORMAL HIGH (ref 2.3–15.9)
RBC.: 3.49 MIL/uL — ABNORMAL LOW (ref 4.22–5.81)
Retic Count, Absolute: 200.5 10*3/uL — ABNORMAL HIGH (ref 19.0–186.0)
Retic Ct Pct: 6.1 % — ABNORMAL HIGH (ref 0.4–3.1)

## 2022-02-11 LAB — COMPREHENSIVE METABOLIC PANEL
ALT: 11 U/L (ref 0–44)
ALT: 14 U/L (ref 0–44)
AST: 36 U/L (ref 15–41)
AST: 35 U/L (ref 15–41)
Albumin: 4.5 g/dL (ref 3.5–5.0)
Albumin: 4.3 g/dL (ref 3.5–5.0)
Alkaline Phosphatase: 144 U/L — ABNORMAL HIGH (ref 38–126)
Alkaline Phosphatase: 144 U/L — ABNORMAL HIGH (ref 38–126)
Anion gap: 13 (ref 5–15)
Anion gap: 7 (ref 5–15)
BUN: 6 mg/dL (ref 6–20)
BUN: 8 mg/dL (ref 6–20)
CO2: 22 mmol/L (ref 22–32)
CO2: 25 mmol/L (ref 22–32)
Calcium: 9.6 mg/dL (ref 8.9–10.3)
Calcium: 9.2 mg/dL (ref 8.9–10.3)
Chloride: 105 mmol/L (ref 98–111)
Chloride: 105 mmol/L (ref 98–111)
Creatinine, Ser: 0.81 mg/dL (ref 0.61–1.24)
Creatinine, Ser: 0.86 mg/dL (ref 0.61–1.24)
GFR, Estimated: >60 mL/min (ref >60)
GFR, Estimated: >60 mL/min (ref >60)
Glucose, Bld: 84 mg/dL (ref 70–99)
Glucose, Bld: 118 mg/dL — ABNORMAL HIGH (ref 70–99)
Potassium: 3.8 mmol/L (ref 3.5–5.1)
Potassium: 4.2 mmol/L (ref 3.5–5.1)
Sodium: 140 mmol/L (ref 135–145)
Sodium: 137 mmol/L (ref 135–145)
Total Bilirubin: 1.6 mg/dL — ABNORMAL HIGH (ref 0.3–1.2)
Total Bilirubin: 2.3 mg/dL — ABNORMAL HIGH (ref 0.3–1.2)
Total Protein: 8.2 g/dL — ABNORMAL HIGH (ref 6.5–8.1)
Total Protein: 7.9 g/dL (ref 6.5–8.1)

## 2022-02-11 MED ORDER — KETOROLAC TROMETHAMINE 30 MG/ML IJ SOLN
15.0000 mg | Freq: Once | INTRAMUSCULAR | Status: AC
  Administered 2022-02-11: 15 mg via INTRAVENOUS
  Filled 2022-02-11: qty 1

## 2022-02-11 MED ORDER — HYDROMORPHONE HCL 2 MG/ML IJ SOLN
2.0000 mg | INTRAMUSCULAR | Status: AC
  Administered 2022-02-11: 2 mg via INTRAVENOUS
  Filled 2022-02-11: qty 1

## 2022-02-11 MED ORDER — IOHEXOL 350 MG/ML SOLN
80.0000 mL | Freq: Once | INTRAVENOUS | Status: AC | PRN
  Administered 2022-02-11: 80 mL via INTRAVENOUS

## 2022-02-11 MED ORDER — ONDANSETRON 4 MG PO TBDP
8.0000 mg | ORAL_TABLET | Freq: Three times a day (TID) | ORAL | Status: DC | PRN
  Administered 2022-02-12 – 2022-02-14 (×3): 8 mg via ORAL
  Filled 2022-02-11: qty 1
  Filled 2022-02-11: qty 2
  Filled 2022-02-11: qty 1

## 2022-02-11 MED ORDER — SODIUM CHLORIDE 0.9 % IV SOLN
1.0000 g | Freq: Once | INTRAVENOUS | Status: AC
  Administered 2022-02-11: 1 g via INTRAVENOUS
  Filled 2022-02-11: qty 10

## 2022-02-11 MED ORDER — SODIUM CHLORIDE 0.45 % IV SOLN: INTRAVENOUS | Status: DC

## 2022-02-11 MED ORDER — SODIUM CHLORIDE 0.9% FLUSH: 9.0000 mL | INTRAVENOUS | Status: DC | PRN

## 2022-02-11 MED ORDER — HYDROMORPHONE 1 MG/ML IV SOLN
INTRAVENOUS | Status: DC
  Administered 2022-02-11: 7.5 mg via INTRAVENOUS
  Administered 2022-02-11: 30 mg via INTRAVENOUS
  Filled 2022-02-11: qty 30

## 2022-02-11 MED ORDER — HYDROMORPHONE HCL 2 MG/ML IJ SOLN
2.0000 mg | INTRAMUSCULAR | Status: AC
  Administered 2022-02-12: 2 mg via INTRAVENOUS
  Filled 2022-02-11: qty 1

## 2022-02-11 MED ORDER — NALOXONE HCL 0.4 MG/ML IJ SOLN: 0.4000 mg | INTRAMUSCULAR | Status: DC | PRN

## 2022-02-11 MED ORDER — SODIUM CHLORIDE 0.9 % IV SOLN
500.0000 mg | Freq: Once | INTRAVENOUS | Status: AC
  Administered 2022-02-11: 500 mg via INTRAVENOUS
  Filled 2022-02-11: qty 5

## 2022-02-11 MED ORDER — ACETAMINOPHEN 500 MG PO TABS
1000.0000 mg | ORAL_TABLET | Freq: Once | ORAL | Status: DC
  Filled 2022-02-11: qty 2

## 2022-02-11 MED ORDER — ONDANSETRON HCL 4 MG/2ML IJ SOLN
4.0000 mg | Freq: Four times a day (QID) | INTRAMUSCULAR | Status: DC | PRN
  Administered 2022-02-11: 4 mg via INTRAVENOUS
  Filled 2022-02-11: qty 2

## 2022-02-11 NOTE — ED Triage Notes (Addendum)
Pt reports with [facility] since yesterday. Pt reports with lower back pain, chest pain, and right leg pain. Pt last took Ibuprofen 800 mg and Oxycodone 10 mg at 9 pm.

## 2022-02-11 NOTE — H&P (Signed)
[facility] History and Physical   Date: 02/11/2022  Patient name: Jonathan [name] Medical record number: [id number] Date of birth: [birth year] Age: 21 y.o. Gender: male PCP: Quentin Angst, MD  Attending physician: Quentin Angst, MD  Chief Complaint: Sickle cell pain   History of Present Illness: Jonathan [name] is a 21 year old male with a medical history significant for sickle cell disease, chronic pain syndrome, opiate dependence and tolerance, and anemia of chronic disease presents with complaints of right lower extremity pain that has been present over the past 24 hours.  This pain is consistent with his typical sickle cell crisis.  Pain intensity is 9/10 unrelieved by oxycodone last taken this a.m. without much relief.  Patient attributes current pain crisis to changes in weather.  He characterizes his pain as intermittent and throbbing.  He denies any fever, chills, chest pain, shortness of breath, or dizziness.  No urinary symptoms, nausea, vomiting, or diarrhea.  Meds: Medications Prior to Admission  Medication Sig Dispense Refill Last Dose   ergocalciferol (DRISDOL) 1.25 MG (50000 UT) capsule Take 1 capsule (50,000 Units total) by mouth once a week. 30 capsule 2    folic acid (FOLVITE) 1 MG tablet Take 1 tablet (1 mg total) by mouth daily. TAKE 1 TABLET BY MOUTH DAILY Strength 1 mg (Patient taking differently: Take 1 mg by mouth daily.) 90 tablet 3    ibuprofen (ADVIL) 800 MG tablet Take 1 tablet (800 mg total) by mouth every 6 (six) hours as needed for moderate pain. 30 tablet 2    Oxycodone HCl 10 MG TABS Take 1 tablet (10 mg total) by mouth every 4 (four) hours as needed (pain). 60 tablet 0     Allergies: Allergy medication [diphenhydramine] Past Medical History:  Diagnosis Date   Acute chest syndrome(517.3) 2013   march 2013, Sept 2013   Asthma    Avascular necrosis of bone of left hip (HCC)    Hb-SS disease with vaso-occlusive crisis (HCC)  07/28/2011   Sickle cell disease with crisis (HCC)    Sickle cell disease, type SS (HCC)    Sickle cell pain crisis (HCC) 10/02/2012   Vaso-occlusive sickle cell crisis (HCC) 11/09/2011   Past Surgical History:  Procedure Laterality Date   ADENOIDECTOMY     SPLENECTOMY     TONSILLECTOMY     TONSILLECTOMY AND ADENOIDECTOMY     TOTAL HIP ARTHROPLASTY Left 03/07/2016   UMBILICAL HERNIA REPAIR     Family History  Problem Relation Age of Onset   Hypertension Mother    Hypertension Maternal Grandmother    Hypertension Maternal Grandfather    Asthma Paternal Grandfather    Diabetes Paternal Grandfather    Social History   Socioeconomic History   Marital status: Single    Spouse name: Not on file   Number of children: Not on file   Years of education: Not on file   Highest education level: Not on file  Occupational History   Not on file  Tobacco Use   Smoking status: Never   Smokeless tobacco: Never  Vaping Use   Vaping Use: Never used  Substance and Sexual Activity   Alcohol use: No    Alcohol/week: 0.0 standard drinks of alcohol   Drug use: No   Sexual activity: Never  Other Topics Concern   Not on file  Social History Narrative   Lives with mother.  No smokers at home      Update:  05/02/16 Lives with Mother only   Social Determinants of Health   Financial Resource Strain: Not on file  Food Insecurity: Not on file  Transportation Needs: Not on file  Physical Activity: Not on file  Stress: Not on file  Social Connections: Not on file  Intimate Partner Violence: Not on file   Review of Systems  Constitutional: Negative.   HENT: Negative.    Eyes: Negative.   Respiratory: Negative.    Cardiovascular: Negative.   Gastrointestinal: Negative.   Genitourinary: Negative.   Musculoskeletal:  Positive for back pain and joint pain.  Skin: Negative.   Neurological: Negative.   Psychiatric/Behavioral: Negative.      Physical Exam: Blood pressure (!) 148/92,  pulse 83, temperature 98.5 F (36.9 C), temperature source Temporal, resp. rate 16, SpO2 96 %. Physical Exam Constitutional:      Appearance: Normal appearance.  Eyes:     Pupils: Pupils are equal, round, and reactive to light.  Cardiovascular:     Rate and Rhythm: Normal rate and regular rhythm.     Pulses: Normal pulses.  Pulmonary:     Effort: Pulmonary effort is normal.  Abdominal:     General: Bowel sounds are normal.  Skin:    General: Skin is warm.  Neurological:     General: No focal deficit present.     Mental Status: He is alert. Mental status is at baseline.  Psychiatric:        Mood and Affect: Mood normal.        Thought Content: Thought content normal.        Judgment: Judgment normal.      Lab results: No results found for this or any previous visit (from the past 24 hour(s)).  Imaging results:  No results found.   Assessment & Plan: Patient admitted to sickle cell day infusion center for management of pain crisis.  Patient is opiate tolerant Initiate IV dilaudid PCA. IV fluids, 0.45% saline at 100 ml/hr Toradol 15 mg IV times one dose Tylenol 1000 mg by mouth times one dose Review CBC with differential, complete metabolic panel, and reticulocytes as results become available.  Pain intensity will be reevaluated in context of functioning and relationship to baseline as care progresses If pain intensity remains elevated and/or sudden change in hemodynamic stability transition to inpatient services for higher level of care.      [name]  APRN, MSN, FNP-C Patient Care [facility] [address] [place], [place] [address] [phone]  02/11/2022, 9:04 AM

## 2022-02-11 NOTE — ED Provider Notes (Signed)
[facility]-EMERGENCY DEPT [facility] Emergency Department Provider Note MRN:  [id number]  Arrival date & time: 02/12/22     Chief Complaint   Sickle Cell Pain Crisis   History of Present Illness   Jonathan [name] is a 21 y.o. year-old male presents to the ED with chief complaint of sickle cell pain that started yesterday.  He states that the pain is mostly in his back and right leg, but he does report some chest discomfort.  He also reports slight cough.  Noted to have mildly elevated temperature in triage 100 degrees and has also been tachycardic.  Most recent O2 saturation is 91% on room air.  Patient's pain has been uncontrolled despite taking his regular home pain regimen.  History provided by patient.   Review of Systems  Pertinent review of systems noted in HPI.    Physical Exam   Vitals:   02/11/22 2330 02/12/22 0010  BP: (!) 137/94 (!) 143/98  Pulse: (!) 113 (!) 128  Resp: 14 20  Temp:    SpO2: 96% 94%    CONSTITUTIONAL:  uncomfortable-appearing, NAD NEURO:  Alert and oriented x 3, CN 3-12 grossly intact EYES:  eyes equal and reactive ENT/NECK:  Supple, no stridor  CARDIO:  tachycardic, regular rhythm, appears well-perfused  PULM:  No respiratory distress,  GI/GU:  non-distended,  MSK/SPINE:  No gross deformities, no edema, moves all extremities  SKIN:  no rash, atraumatic   *Additional and/or pertinent findings included in MDM below  Diagnostic and Interventional Summary    EKG Interpretation  Date/Time:    Ventricular Rate:    PR Interval:    QRS Duration:   QT Interval:    QTC Calculation:   R Axis:     Text Interpretation:         Labs Reviewed  COMPREHENSIVE METABOLIC PANEL - Abnormal; Notable for the following components:      Result Value   Glucose, Bld 118 (*)    Alkaline Phosphatase 144 (*)    Total Bilirubin 2.3 (*)    All other components within normal limits  RETICULOCYTES - Abnormal; Notable for the following components:   Retic  Ct Pct 6.6 (*)    RBC. 3.19 (*)    Retic Count, Absolute 209.0 (*)    Immature Retic Fract 23.2 (*)    All other components within normal limits  CBC WITH DIFFERENTIAL/PLATELET - Abnormal; Notable for the following components:   WBC 15.0 (*)    RBC 3.21 (*)    Hemoglobin 9.5 (*)    HCT 28.4 (*)    RDW 20.5 (*)    Platelets 433 (*)    nRBC 17.5 (*)    Neutro Abs 11.9 (*)    Monocytes Absolute 1.3 (*)    Abs Immature Granulocytes 0.21 (*)    All other components within normal limits    CT Angio Chest PE W and/or Wo Contrast  Final Result    DG Chest Portable 1 View  Final Result      Medications  0.45 % sodium chloride infusion ( Intravenous New Bag/Given 02/11/22 2333)  HYDROmorphone (DILAUDID) injection 2 mg (has no administration in time range)  ondansetron (ZOFRAN-ODT) disintegrating tablet 8 mg (8 mg Oral Given 02/12/22 0016)  azithromycin (ZITHROMAX) 500 mg in sodium chloride 0.9 % 250 mL IVPB (500 mg Intravenous New Bag/Given 02/11/22 2357)  HYDROmorphone (DILAUDID) injection 2 mg (2 mg Intravenous Given 02/11/22 2332)  HYDROmorphone (DILAUDID) injection 2 mg (2 mg Intravenous Given  02/11/22 2357)  cefTRIAXone (ROCEPHIN) 1 g in sodium chloride 0.9 % 100 mL IVPB (0 g Intravenous Stopped 02/11/22 2357)  iohexol (OMNIPAQUE) 350 MG/ML injection 80 mL (80 mLs Intravenous Contrast Given 02/11/22 2347)     Procedures  /  Critical Care .Critical Care  Performed by: Roxy Horseman, PA-C Authorized by: Roxy Horseman, PA-C   Critical care provider statement:    Critical care time (minutes):  45   Critical care was necessary to treat or prevent imminent or life-threatening deterioration of the following conditions:  Circulatory failure   Critical care was time spent personally by me on the following activities:  Development of treatment plan with patient or surrogate, discussions with consultants, evaluation of patient's response to treatment, examination of patient, ordering and  review of laboratory studies, ordering and review of radiographic studies, ordering and performing treatments and interventions, pulse oximetry, re-evaluation of patient's condition and review of old charts   ED Course and Medical Decision Making  I have reviewed the triage vital signs, the nursing notes, and pertinent available records from the EMR.  Social Determinants Affecting Complexity of Care: Patient has no clinically significant social determinants affecting this chief complaint..   ED Course:   Patient here with sickle cell pain.  Top differential diagnoses include acute chest, vaso-occlusive crisis, chronic pain with exacerbation.. Medical Decision Making Patient here with sickle cell pain.  Has had cough and low grade temp of 100 in triage.  Could be developing acute chest or CAP.  PE study negative.  Will start abx.  Will need admission.  Problems Addressed: Sickle cell pain crisis [facility]): acute illness or injury  Amount and/or Complexity of Data Reviewed Labs: ordered.    Details: Leukocytosis to 15, with cough and left shift. Radiology: ordered and independent interpretation performed.    Details: No large PE seen by me  Risk Prescription drug management. Decision regarding hospitalization.     Consultants: I discussed the case with Hospitalist, Dr. Cyndia Bent, who is appreciated for admitting.   Treatment and Plan: Patient's exam and diagnostic results are concerning for sickle cell pain crisis with possible CAP.  Feel that patient will need admission to the hospital for further treatment and evaluation.    Final Clinical Impressions(s) / ED Diagnoses     ICD-10-CM   1. Sickle cell pain crisis [facility])  D57.00       ED Discharge Orders     None         Discharge Instructions Discussed with and Provided to Patient:   Discharge Instructions   None      Roxy Horseman, PA-C 02/12/22 0054    Molpus, Jonny Ruiz, MD 02/12/22 [phone]

## 2022-02-11 NOTE — Telephone Encounter (Signed)
Patient came to the [facility] reception requesting to be admitted to the day hospital due to pain in the lower back and legs rated at 8/10. Denied chest pain, fever, diarrhea, abdominal pain, nausea/vomitting and priapism. Screened negative for Covid-19 symptoms. Admitted to having means of transportation without driving self after treatment. Last took 10 mg of oxycodone at 05:00 am today. Per provider, patient can be admitted at the day hospital for treatment. Patient notified, verbalized understanding.

## 2022-02-11 NOTE — Discharge Summary (Signed)
[facility] Discharge Summary   Patient ID: Jonathan [name] MRN: [id number] DOB/AGE: [birth year] 21 y.o.  Admit date: 02/11/2022 Discharge date: 02/11/2022  Primary Care Physician:  Quentin Angst, MD  Admission Diagnoses:  Active Problems:   Sickle cell pain crisis [facility])   Discharge Medications:  Allergies as of 02/11/2022       Reactions   Allergy Medication [diphenhydramine] Swelling, Other (See Comments)   Face swells        Medication List     TAKE these medications    ergocalciferol 1.25 MG (50000 UT) capsule Commonly known as: Drisdol Take 1 capsule (50,000 Units total) by mouth once a week.   folic acid 1 MG tablet Commonly known as: FOLVITE Take 1 tablet (1 mg total) by mouth daily. TAKE 1 TABLET BY MOUTH DAILY Strength 1 mg What changed: additional instructions   ibuprofen 800 MG tablet Commonly known as: ADVIL Take 1 tablet (800 mg total) by mouth every 6 (six) hours as needed for moderate pain.   Oxycodone HCl 10 MG Tabs Take 1 tablet (10 mg total) by mouth every 4 (four) hours as needed (pain).         Consults:  None  Significant Diagnostic Studies:  No results found.  History of present illness:  Jonathan [name] is a 21 year old male with a medical history significant for sickle cell disease, chronic pain syndrome, opiate dependence and tolerance, and anemia of chronic disease presents with complaints of right lower extremity pain that has been present over the past 24 hours.  This pain is consistent with his typical sickle cell crisis.  Pain intensity is 9/10 unrelieved by oxycodone last taken this a.m. without much relief.  Patient attributes current pain crisis to changes in weather.  He characterizes his pain as intermittent and throbbing.  He denies any fever, chills, chest pain, shortness of breath, or dizziness.  No urinary symptoms, nausea, vomiting, or diarrhea.  [facility] Course: Patient admitted  to sickle cell day infusion clinic for management of this pain crisis. Reviewed all laboratory values, largely consistent with his baseline. Pain managed with IV Dilaudid PCA Toradol 15 mg IV x1 Tylenol 1000 mg x 1 IV fluids, 0.45% saline at 100 mL/h Pain intensity decreased to 3/10 and patient is requesting discharge home. Patient is alert, oriented, and ambulating without assistance.  He will discharge home in a hemodynamically stable condition.  Discharge instructions: Resume all home medications.   Follow up with PCP as previously  scheduled.   Discussed the importance of drinking 64 ounces of water daily, dehydration of red blood cells may lead further sickling.   Avoid all stressors that precipitate sickle cell pain crisis.     The patient was given clear instructions to go to ER or return to medical center if symptoms do not improve, worsen or new problems develop.    Physical Exam at Discharge:  BP (!) 143/81 (BP Location: Right Arm)   Pulse 80   Temp 98.5 F (36.9 C) (Temporal)   Resp 15   SpO2 100%  Physical Exam Constitutional:      Appearance: Normal appearance.  Eyes:     Pupils: Pupils are equal, round, and reactive to light.  Cardiovascular:     Rate and Rhythm: Normal rate and regular rhythm.     Pulses: Normal pulses.  Pulmonary:     Effort: Pulmonary effort is normal.  Abdominal:     General: Bowel sounds are normal.  Musculoskeletal:        General: Normal range of motion.  Skin:    General: Skin is warm.  Neurological:     General: No focal deficit present.     Mental Status: He is alert. Mental status is at baseline.  Psychiatric:        Mood and Affect: Mood normal.        Behavior: Behavior normal.        Thought Content: Thought content normal.        Judgment: Judgment normal.      Disposition at Discharge: Discharge disposition: 01-Home or Self Care       Discharge Orders: Discharge Instructions     Discharge patient    Complete by: As directed    Discharge disposition: 01-Home or Self Care   Discharge patient date: 02/11/2022       Condition at Discharge:   Stable  Time spent on Discharge:  Greater than 30 minutes.  Signed: [name]  APRN, MSN, FNP-C Patient Care [facility] [address] [place], [place] [address] [phone]  02/11/2022, 9:43 PM

## 2022-02-11 NOTE — Progress Notes (Signed)
Patient admitted to the day hospital for treatment of sickle cell pain crisis. Patient reported pain rated 8/10 in the back and legs . Patient placed on Dilaudid PCA, given IV Toradol, IV Zofran and hydrated with IV fluids. At discharge patient reported  pain at 2/10. Declined printed AVS given to patient. Patient alert, oriented and ambulatory at discharge.

## 2022-02-12 DIAGNOSIS — J45909 Unspecified asthma, uncomplicated: Secondary | ICD-10-CM | POA: Diagnosis present

## 2022-02-12 DIAGNOSIS — J189 Pneumonia, unspecified organism: Secondary | ICD-10-CM

## 2022-02-12 DIAGNOSIS — D638 Anemia in other chronic diseases classified elsewhere: Secondary | ICD-10-CM | POA: Diagnosis present

## 2022-02-12 DIAGNOSIS — D5700 Hb-SS disease with crisis, unspecified: Secondary | ICD-10-CM

## 2022-02-12 DIAGNOSIS — R911 Solitary pulmonary nodule: Secondary | ICD-10-CM | POA: Diagnosis present

## 2022-02-12 DIAGNOSIS — F1120 Opioid dependence, uncomplicated: Secondary | ICD-10-CM | POA: Diagnosis present

## 2022-02-12 DIAGNOSIS — Z96642 Presence of left artificial hip joint: Secondary | ICD-10-CM | POA: Diagnosis present

## 2022-02-12 DIAGNOSIS — G894 Chronic pain syndrome: Secondary | ICD-10-CM | POA: Diagnosis present

## 2022-02-12 DIAGNOSIS — Z825 Family history of asthma and other chronic lower respiratory diseases: Secondary | ICD-10-CM | POA: Diagnosis not present

## 2022-02-12 DIAGNOSIS — Z79899 Other long term (current) drug therapy: Secondary | ICD-10-CM | POA: Diagnosis not present

## 2022-02-12 DIAGNOSIS — Z888 Allergy status to other drugs, medicaments and biological substances status: Secondary | ICD-10-CM | POA: Diagnosis not present

## 2022-02-12 DIAGNOSIS — J9811 Atelectasis: Secondary | ICD-10-CM | POA: Diagnosis present

## 2022-02-12 LAB — CBC WITH DIFFERENTIAL/PLATELET
Abs Immature Granulocytes: 0.21 10*3/uL — ABNORMAL HIGH (ref 0.00–0.07)
Basophils Absolute: 0.1 10*3/uL (ref 0.0–0.1)
Basophils Relative: 1 %
Eosinophils Absolute: 0.1 10*3/uL (ref 0.0–0.5)
Eosinophils Relative: 1 %
HCT: 28.4 % — ABNORMAL LOW (ref 39.0–52.0)
Hemoglobin: 9.5 g/dL — ABNORMAL LOW (ref 13.0–17.0)
Immature Granulocytes: 1 %
Lymphocytes Relative: 9 %
Lymphs Abs: 1.4 10*3/uL (ref 0.7–4.0)
MCH: 29.6 pg (ref 26.0–34.0)
MCHC: 33.5 g/dL (ref 30.0–36.0)
MCV: 88.5 fL (ref 80.0–100.0)
Monocytes Absolute: 1.3 10*3/uL — ABNORMAL HIGH (ref 0.1–1.0)
Monocytes Relative: 9 %
Neutro Abs: 11.9 10*3/uL — ABNORMAL HIGH (ref 1.7–7.7)
Neutrophils Relative %: 79 %
Platelets: 433 10*3/uL — ABNORMAL HIGH (ref 150–400)
RBC: 3.21 MIL/uL — ABNORMAL LOW (ref 4.22–5.81)
RDW: 20.5 % — ABNORMAL HIGH (ref 11.5–15.5)
WBC: 15 10*3/uL — ABNORMAL HIGH (ref 4.0–10.5)
nRBC: 17.5 % — ABNORMAL HIGH (ref 0.0–0.2)

## 2022-02-12 LAB — PROCALCITONIN: Procalcitonin: 1.12 ng/mL

## 2022-02-12 LAB — RETICULOCYTES
Immature Retic Fract: 23.2 % — ABNORMAL HIGH (ref 2.3–15.9)
RBC.: 3.19 MIL/uL — ABNORMAL LOW (ref 4.22–5.81)
Retic Count, Absolute: 209 10*3/uL — ABNORMAL HIGH (ref 19.0–186.0)
Retic Ct Pct: 6.6 % — ABNORMAL HIGH (ref 0.4–3.1)

## 2022-02-12 MED ORDER — KETOROLAC TROMETHAMINE 15 MG/ML IJ SOLN
15.0000 mg | Freq: Four times a day (QID) | INTRAMUSCULAR | Status: AC
  Administered 2022-02-12 (×4): 15 mg via INTRAVENOUS
  Filled 2022-02-12 (×4): qty 1

## 2022-02-12 MED ORDER — SODIUM CHLORIDE 0.9 % IV SOLN
500.0000 mg | INTRAVENOUS | Status: DC
  Administered 2022-02-12 – 2022-02-14 (×3): 500 mg via INTRAVENOUS
  Filled 2022-02-12 (×3): qty 5

## 2022-02-12 MED ORDER — POLYETHYLENE GLYCOL 3350 17 G PO PACK: 17.0000 g | PACK | Freq: Every day | ORAL | Status: DC | PRN

## 2022-02-12 MED ORDER — SODIUM CHLORIDE 0.9% FLUSH: 9.0000 mL | INTRAVENOUS | Status: DC | PRN

## 2022-02-12 MED ORDER — GUAIFENESIN-DM 100-10 MG/5ML PO SYRP
5.0000 mL | ORAL_SOLUTION | ORAL | Status: DC | PRN
  Administered 2022-02-12 – 2022-02-15 (×8): 5 mL via ORAL
  Filled 2022-02-12 (×8): qty 10

## 2022-02-12 MED ORDER — HYDROMORPHONE HCL 2 MG/ML IJ SOLN
2.0000 mg | INTRAMUSCULAR | Status: DC | PRN
  Administered 2022-02-12 (×2): 2 mg via INTRAVENOUS
  Filled 2022-02-12 (×2): qty 1

## 2022-02-12 MED ORDER — HYDROXYZINE HCL 25 MG PO TABS: 25.0000 mg | ORAL_TABLET | ORAL | Status: DC | PRN

## 2022-02-12 MED ORDER — SODIUM CHLORIDE 0.9 % IV SOLN
1.0000 g | INTRAVENOUS | Status: DC
  Administered 2022-02-12 – 2022-02-14 (×3): 1 g via INTRAVENOUS
  Filled 2022-02-12 (×3): qty 10

## 2022-02-12 MED ORDER — HYDROMORPHONE 1 MG/ML IV SOLN
INTRAVENOUS | Status: DC
  Administered 2022-02-12: 2.5 mg via INTRAVENOUS
  Administered 2022-02-12: 30 mg via INTRAVENOUS
  Administered 2022-02-13: 3.5 mg via INTRAVENOUS
  Administered 2022-02-13: 5 mg via INTRAVENOUS
  Administered 2022-02-13: 3 mg via INTRAVENOUS
  Administered 2022-02-13 (×2): 1.5 mg via INTRAVENOUS
  Administered 2022-02-14: 30 mg via INTRAVENOUS
  Administered 2022-02-14: 2.5 mg via INTRAVENOUS
  Administered 2022-02-14: 1 mg via INTRAVENOUS
  Administered 2022-02-14: 1.5 mg via INTRAVENOUS
  Filled 2022-02-12 (×2): qty 30

## 2022-02-12 MED ORDER — NALOXONE HCL 0.4 MG/ML IJ SOLN: 0.4000 mg | INTRAMUSCULAR | Status: DC | PRN

## 2022-02-12 MED ORDER — SENNOSIDES-DOCUSATE SODIUM 8.6-50 MG PO TABS
1.0000 | ORAL_TABLET | Freq: Two times a day (BID) | ORAL | Status: DC
  Administered 2022-02-12 – 2022-02-17 (×11): 1 via ORAL
  Filled 2022-02-12 (×11): qty 1

## 2022-02-12 MED ORDER — ENOXAPARIN SODIUM 40 MG/0.4ML IJ SOSY
40.0000 mg | PREFILLED_SYRINGE | INTRAMUSCULAR | Status: DC
Start: 2022-02-12 — End: 2022-02-17
  Administered 2022-02-12 – 2022-02-16 (×5): 40 mg via SUBCUTANEOUS
  Filled 2022-02-12 (×6): qty 0.4

## 2022-02-12 MED ORDER — HYDROMORPHONE 1 MG/ML IV SOLN: INTRAVENOUS | Status: DC

## 2022-02-12 NOTE — Progress Notes (Signed)
Sickle cell crisis patient.  Will be transferred to their service.  Notified our flow manager.

## 2022-02-12 NOTE — H&P (Addendum)
History and Physical    Patient: Jonathan [name] LOV:[id number] DOB: 2001-06-12 DOA: 02/11/2022 DOS: the patient was seen and examined on 02/12/2022 PCP: Quentin Angst, MD  Patient coming from: Home  Chief Complaint:  Chief Complaint  Patient presents with   Sickle Cell Pain Crisis   HPI: Jonathan [name] is a 21 y.o. male with medical history significant of sickle cell disease, chronic pain syndrome, anemia of chronic disease who presents with with lower extremity and back pain.  Pain is consistent with his typical sickle cell crisis.  He was just discharged yesterday from sickle cell clinic but pain again was unrelieved by home oxycodone.  Also noted brief episode of chest pain with standing.  Currently chest pain-free.  Reports 3 days of cold symptoms with cough.  In the ED, he had temperature of 100F, he was tachycardic and normotensive on room air.  Has chronic leukocytosis with 15 K on presentation.  Hemoglobin at baseline at 9.5.  Chronically elevated total bilirubin is stable.  CTA chest obtained was negative for pulmonary embolism.  Bilateral lower lobe atelectasis and 4 mm noncalcified left upper lobe lung nodule.  Review of Systems: As mentioned in the history of present illness. All other systems reviewed and are negative. Past Medical History:  Diagnosis Date   Acute chest syndrome(517.3) 08/26/2011   march 2013, Sept 2013   Asthma    Avascular necrosis of bone of left hip (HCC)    Sickle cell disease, type SS (HCC)    Past Surgical History:  Procedure Laterality Date   ADENOIDECTOMY     SPLENECTOMY     TONSILLECTOMY     TONSILLECTOMY AND ADENOIDECTOMY     TOTAL HIP ARTHROPLASTY Left 03/07/2016   UMBILICAL HERNIA REPAIR     Social History:  reports that he has never smoked. He has never used smokeless tobacco. He reports that he does not drink alcohol and does not use drugs.  Allergies  Allergen Reactions   Allergy Medication [Diphenhydramine] Swelling and  Other (See Comments)    Face swells    Family History  Problem Relation Age of Onset   Hypertension Mother    Hypertension Maternal Grandmother    Hypertension Maternal Grandfather    Asthma Paternal Grandfather    Diabetes Paternal Grandfather     Prior to Admission medications   Medication Sig Start Date End Date Taking? Authorizing Provider  ibuprofen (ADVIL) 800 MG tablet Take 1 tablet (800 mg total) by mouth every 6 (six) hours as needed for moderate pain. 12/10/21  Yes Quentin Angst, MD  Oxycodone HCl 10 MG TABS Take 1 tablet (10 mg total) by mouth every 4 (four) hours as needed (pain). 02/10/22  Yes Massie Maroon, FNP  ergocalciferol (DRISDOL) 1.25 MG (50000 UT) capsule Take 1 capsule (50,000 Units total) by mouth once a week. Patient not taking: Reported on 02/11/2022 04/12/20   Barbette Merino, NP  folic acid (FOLVITE) 1 MG tablet Take 1 tablet (1 mg total) by mouth daily. TAKE 1 TABLET BY MOUTH DAILY Strength 1 mg Patient not taking: Reported on 02/11/2022 10/22/21   Massie Maroon, FNP    Physical Exam: Vitals:   02/12/22 0200 02/12/22 0230 02/12/22 0245 02/12/22 0251  BP: 131/84 134/75    Pulse: (!) 113 (!) 117 (!) 113 (!) 111  Resp:      Temp:      TempSrc:      SpO2: 92% (!) 86% 90% 95%  Weight:  Height:       Constitutional: NAD, calm, comfortable, young male laying in bed talking on cell phone Eyes: lids and conjunctivae normal ENMT: Mucous membranes are moist.  Neck: normal, supple Respiratory: clear to auscultation bilaterally, no wheezing, no crackles. Normal respiratory effort. No accessory muscle use.  Cardiovascular: Regular rate and rhythm, no murmurs / rubs / gallops. No extremity edema. 2+ pedal pulses.  Abdomen: no tenderness,  Bowel sounds positive.  Musculoskeletal: no clubbing / cyanosis. No joint deformity upper and lower extremities. Good ROM, no contractures. Normal muscle tone.  Skin: no rashes, lesions, ulcers. No  induration Neurologic: CN 2-12 grossly intact. Strength 5/5 in all 4.  Psychiatric: Normal judgment and insight. Alert and oriented x 3. Normal mood. Data Reviewed:  See HPI  Assessment and Plan: * Sickle cell crisis (HCC) -Continues 0.45% IV fluids  -IV Dilaudid via PCA with settings of 0.5mg , 10-minute lockout with max of 1mg /hr -Toradol 15mg  q6HR Monitor vital signs closely and re-evaluate pain scale    Pulmonary nodule 4 mm noncalcified left upper lobe lung nodule. No further imaging recommended.  Community acquired pneumonia -CTA chest negative for infiltration but has had 3 days of URI symptoms and elevated procalcitonin -will continue Rocephin and Azithromcyin   Anemia of chronic disease Hgb stable around baseline at 9.5.      Advance Care Planning:   Code Status: Full Code   Consults: none  Family Communication: no family at bedside  Severity of Illness: The appropriate patient status for this patient is OBSERVATION. Observation status is judged to be reasonable and necessary in order to provide the required intensity of service to ensure the patient's safety. The patient's presenting symptoms, physical exam findings, and initial radiographic and laboratory data in the context of their medical condition is felt to place them at decreased risk for further clinical deterioration. Furthermore, it is anticipated that the patient will be medically stable for discharge from the hospital within 2 midnights of admission.   Author: , DO 02/12/2022 3:47 AM  For on call review [email or web address].

## 2022-02-12 NOTE — Assessment & Plan Note (Signed)
-  CTA chest negative for infiltration but has had 3 days of URI symptoms and elevated procalcitonin -will continue Rocephin and Azithromcyin

## 2022-02-12 NOTE — Progress Notes (Signed)
Jonathan [name] is a 21 year old male with a medical history significant for sickle cell disease that was admitted overnight for sickle cell pain crisis.  Patient was treated and evaluated in the sickle cell day infusion clinic on yesterday.  He states that he felt well at discharge, however pain returned shortly upon returning home.  Pain has been mostly unrelieved by his home medications.  Patient denies any headache, shortness of breath, chest pain, urinary symptoms, nausea, vomiting, or diarrhea.  Care plan: Change settings of IV Dilaudid PCA to 0.5 mg, 10-minute lockout, and 3 mg/h.  We will reassess in AM.  [name]  APRN, MSN, FNP-C Patient Care [facility] [address] [place], [place] [address] [phone]

## 2022-02-12 NOTE — Progress Notes (Signed)
Patient HR remain 130-140 at rest. N.P. Informed, No new orders. Pt no distress at this time. Will continue to monitor.

## 2022-02-12 NOTE — Assessment & Plan Note (Signed)
4 mm noncalcified left upper lobe lung nodule. No further imaging recommended.

## 2022-02-12 NOTE — Progress Notes (Signed)
  Transition of Care (TOC) Screening Note   Patient Details  Name: Azarias Chiou Rohrman Date of Birth: 2000-08-26   Transition of Care [facility]) CM/SW Contact:    Eldana Isip, Meriam Sprague, RN Phone Number: 02/12/2022, 1:34 PM    Transition of Care Department [facility]) has reviewed patient and no TOC needs have been identified at this time. We will continue to monitor patient advancement through interdisciplinary progression rounds. If new patient transition needs arise, please place a TOC consult.

## 2022-02-12 NOTE — Assessment & Plan Note (Signed)
Hgb stable around baseline at 9.5.

## 2022-02-12 NOTE — ED Notes (Signed)
Gave pt more water.

## 2022-02-12 NOTE — Assessment & Plan Note (Signed)
-  Continues 0.45% IV fluids  -IV Dilaudid via PCA with settings of 0.5mg , 10-minute lockout with max of 1mg /hr -Toradol 15mg  q6HR Monitor vital signs closely and re-evaluate pain scale

## 2022-02-13 DIAGNOSIS — D5700 Hb-SS disease with crisis, unspecified: Secondary | ICD-10-CM | POA: Diagnosis not present

## 2022-02-13 LAB — CBC WITH DIFFERENTIAL/PLATELET
Abs Immature Granulocytes: 0.32 10*3/uL — ABNORMAL HIGH (ref 0.00–0.07)
Basophils Absolute: 0.1 10*3/uL (ref 0.0–0.1)
Basophils Relative: 0 %
Eosinophils Absolute: 0.1 10*3/uL (ref 0.0–0.5)
Eosinophils Relative: 1 %
HCT: 24.2 % — ABNORMAL LOW (ref 39.0–52.0)
Hemoglobin: 8 g/dL — ABNORMAL LOW (ref 13.0–17.0)
Immature Granulocytes: 2 %
Lymphocytes Relative: 9 %
Lymphs Abs: 1.6 10*3/uL (ref 0.7–4.0)
MCH: 29.6 pg (ref 26.0–34.0)
MCHC: 33.1 g/dL (ref 30.0–36.0)
MCV: 89.6 fL (ref 80.0–100.0)
Monocytes Absolute: 1.9 10*3/uL — ABNORMAL HIGH (ref 0.1–1.0)
Monocytes Relative: 12 %
Neutro Abs: 12.9 10*3/uL — ABNORMAL HIGH (ref 1.7–7.7)
Neutrophils Relative %: 76 %
Platelets: 390 10*3/uL (ref 150–400)
RBC: 2.7 MIL/uL — ABNORMAL LOW (ref 4.22–5.81)
RDW: 22 % — ABNORMAL HIGH (ref 11.5–15.5)
WBC: 16.9 10*3/uL — ABNORMAL HIGH (ref 4.0–10.5)
nRBC: 12.9 % — ABNORMAL HIGH (ref 0.0–0.2)

## 2022-02-13 LAB — COMPREHENSIVE METABOLIC PANEL
ALT: 12 U/L (ref 0–44)
AST: 26 U/L (ref 15–41)
Albumin: 3.8 g/dL (ref 3.5–5.0)
Alkaline Phosphatase: 150 U/L — ABNORMAL HIGH (ref 38–126)
Anion gap: 7 (ref 5–15)
BUN: 9 mg/dL (ref 6–20)
CO2: 27 mmol/L (ref 22–32)
Calcium: 9 mg/dL (ref 8.9–10.3)
Chloride: 103 mmol/L (ref 98–111)
Creatinine, Ser: 0.74 mg/dL (ref 0.61–1.24)
GFR, Estimated: >60 mL/min (ref >60)
Glucose, Bld: 106 mg/dL — ABNORMAL HIGH (ref 70–99)
Potassium: 4 mmol/L (ref 3.5–5.1)
Sodium: 137 mmol/L (ref 135–145)
Total Bilirubin: 1.5 mg/dL — ABNORMAL HIGH (ref 0.3–1.2)
Total Protein: 7.3 g/dL (ref 6.5–8.1)

## 2022-02-13 LAB — RETICULOCYTES
Immature Retic Fract: 49.1 % — ABNORMAL HIGH (ref 2.3–15.9)
RBC.: 2.72 MIL/uL — ABNORMAL LOW (ref 4.22–5.81)
Retic Count, Absolute: 244 10*3/uL — ABNORMAL HIGH (ref 19.0–186.0)
Retic Ct Pct: 9 % — ABNORMAL HIGH (ref 0.4–3.1)

## 2022-02-13 LAB — LACTATE DEHYDROGENASE: LDH: 532 U/L — ABNORMAL HIGH (ref 98–192)

## 2022-02-13 MED ORDER — ACETAMINOPHEN 325 MG PO TABS
650.0000 mg | ORAL_TABLET | Freq: Four times a day (QID) | ORAL | Status: AC | PRN
  Administered 2022-02-13 (×2): 650 mg via ORAL
  Filled 2022-02-13 (×2): qty 2

## 2022-02-13 NOTE — Progress Notes (Signed)
Patient stood up at bedside to use urinal, patient's HR increased to 170s. Patient assisted back to lying position, HR trending down to 130s-140s. Patient declined chest pain or palpitations. Patient stated "I feel fine".

## 2022-02-13 NOTE — Progress Notes (Signed)
Patient spike a temp of 102.7, HR remain 130-140's. CU Charge informed , N.P. made aware. Tylenol was ordered. Tylenol given, Sponge bath done. Recheck VS in 1 hour. Will continue to monitor. Marland Kitchen

## 2022-02-13 NOTE — Progress Notes (Signed)
   02/13/22 1958  Assess: MEWS Score  Temp 99.3 F (37.4 C)  BP 132/88  MAP (mmHg) 103  Pulse Rate (!) 123  Resp 18  SpO2 97 %  O2 Device Nasal Cannula  O2 Flow Rate (L/min) 2 L/min  Assess: MEWS Score  MEWS Temp 0  MEWS Systolic 0  MEWS Pulse 2  MEWS RR 0  MEWS LOC 0  MEWS Score 2  MEWS Score Color Yellow  Assess: if the MEWS score is Yellow or Red  Were vital signs taken at a resting state? Yes  Focused Assessment No change from prior assessment  Does the patient meet 2 or more of the SIRS criteria? Yes  Does the patient have a confirmed or suspected source of infection? Yes  Provider and Rapid Response Notified? Yes (by previous RN)  MEWS guidelines implemented *See Row Information* Yes  Treat  MEWS Interventions Administered prn meds/treatments  Take Vital Signs  Increase Vital Sign Frequency  Yellow: Q 2hr X 2 then Q 4hr X 2, if remains yellow, continue Q 4hrs  Escalate  MEWS: Escalate Yellow: discuss with charge nurse/RN and consider discussing with provider and RRT  Notify: Charge Nurse/RN  Name of Charge Nurse/RN Notified Scarlett Presto, RN (Discussed with day charge RN)  Date Charge Nurse/RN Notified 02/13/22  Time Charge Nurse/RN Notified 1900  Notify: Provider  Provider Name/Title notified by prev. RN  Date Provider Notified 02/13/22  Notify: Rapid Response  Name of Rapid Response RN Notified already notified on previous shift; RR aware.  Document  Patient Outcome Stabilized after interventions  Progress note created (see row info) Yes  Assess: SIRS CRITERIA  SIRS Temperature  0  SIRS Pulse 1  SIRS Respirations  0  SIRS WBC 0  SIRS Score Sum  1

## 2022-02-13 NOTE — Progress Notes (Signed)
   02/13/22 0951  Assess: MEWS Score  Temp 100 F (37.8 C)  BP (!) 154/91  MAP (mmHg) 109  Pulse Rate (!) 122  Resp 19  SpO2 96 %  O2 Device Nasal Cannula  O2 Flow Rate (L/min) 2 L/min  Assess: MEWS Score  MEWS Temp 0  MEWS Systolic 0  MEWS Pulse 2  MEWS RR 0  MEWS LOC 0  MEWS Score 2  MEWS Score Color Yellow  Assess: if the MEWS score is Yellow or Red  Were vital signs taken at a resting state? Yes  Focused Assessment No change from prior assessment  Does the patient meet 2 or more of the SIRS criteria? Yes  Does the patient have a confirmed or suspected source of infection? Yes  Provider and Rapid Response Notified? Yes  MEWS guidelines implemented *See Row Information* Yes  Treat  MEWS Interventions Administered prn meds/treatments  Pain Scale 0-10  Pain Score 6  Pain Intervention(s) Cold applied;PCA encouraged  Take Vital Signs  Increase Vital Sign Frequency  Yellow: Q 2hr X 2 then Q 4hr X 2, if remains yellow, continue Q 4hrs  Escalate  MEWS: Escalate Yellow: discuss with charge nurse/RN and consider discussing with provider and RRT  Notify: Charge Nurse/RN  Name of Charge Nurse/RN Notified [place], RN  Date Charge Nurse/RN Notified 02/13/22  Time Charge Nurse/RN Notified 1054  Notify: Provider  Provider Name/Title Julianne Handler, FNP  Date Provider Notified 02/13/22  Time Provider Notified 1030  Method of Notification Face-to-face  Notification Reason  (Yellow MEWS)  Provider response In department  Document  Patient Outcome Stabilized after interventions  Assess: SIRS CRITERIA  SIRS Temperature  0  SIRS Pulse 1  SIRS Respirations  0  SIRS WBC 0  SIRS Score Sum  1

## 2022-02-14 DIAGNOSIS — D5700 Hb-SS disease with crisis, unspecified: Secondary | ICD-10-CM | POA: Diagnosis not present

## 2022-02-14 MED ORDER — HYDROMORPHONE 1 MG/ML IV SOLN
INTRAVENOUS | Status: DC
  Administered 2022-02-14: 1 mg via INTRAVENOUS
  Administered 2022-02-14: 2 mg via INTRAVENOUS
  Administered 2022-02-14: 0.5 mg via INTRAVENOUS
  Administered 2022-02-14: 2.5 mg via INTRAVENOUS
  Administered 2022-02-15: 3 mg via INTRAVENOUS
  Administered 2022-02-15: 2 mg via INTRAVENOUS
  Administered 2022-02-15: 1 mg via INTRAVENOUS
  Filled 2022-02-14: qty 30

## 2022-02-14 MED ORDER — OXYCODONE HCL 5 MG PO TABS
10.0000 mg | ORAL_TABLET | ORAL | Status: DC | PRN
  Administered 2022-02-14 – 2022-02-17 (×8): 10 mg via ORAL
  Filled 2022-02-14 (×9): qty 2

## 2022-02-15 DIAGNOSIS — D638 Anemia in other chronic diseases classified elsewhere: Secondary | ICD-10-CM | POA: Diagnosis not present

## 2022-02-15 DIAGNOSIS — D5700 Hb-SS disease with crisis, unspecified: Secondary | ICD-10-CM | POA: Diagnosis not present

## 2022-02-15 DIAGNOSIS — J189 Pneumonia, unspecified organism: Secondary | ICD-10-CM | POA: Diagnosis not present

## 2022-02-15 DIAGNOSIS — R911 Solitary pulmonary nodule: Secondary | ICD-10-CM

## 2022-02-15 LAB — CBC WITH DIFFERENTIAL/PLATELET
Abs Immature Granulocytes: 0.07 10*3/uL (ref 0.00–0.07)
Basophils Absolute: 0 10*3/uL (ref 0.0–0.1)
Basophils Relative: 0 %
Eosinophils Absolute: 0.2 10*3/uL (ref 0.0–0.5)
Eosinophils Relative: 2 %
HCT: 21 % — ABNORMAL LOW (ref 39.0–52.0)
Hemoglobin: 6.8 g/dL — CL (ref 13.0–17.0)
Immature Granulocytes: 1 %
Lymphocytes Relative: 18 %
Lymphs Abs: 2.5 10*3/uL (ref 0.7–4.0)
MCH: 28.8 pg (ref 26.0–34.0)
MCHC: 32.4 g/dL (ref 30.0–36.0)
MCV: 89 fL (ref 80.0–100.0)
Monocytes Absolute: 1.4 10*3/uL — ABNORMAL HIGH (ref 0.1–1.0)
Monocytes Relative: 10 %
Neutro Abs: 10.1 10*3/uL — ABNORMAL HIGH (ref 1.7–7.7)
Neutrophils Relative %: 69 %
Platelets: 418 10*3/uL — ABNORMAL HIGH (ref 150–400)
RBC: 2.36 MIL/uL — ABNORMAL LOW (ref 4.22–5.81)
RDW: 19.7 % — ABNORMAL HIGH (ref 11.5–15.5)
WBC: 14.3 10*3/uL — ABNORMAL HIGH (ref 4.0–10.5)
nRBC: 4.5 % — ABNORMAL HIGH (ref 0.0–0.2)

## 2022-02-15 LAB — PREPARE RBC (CROSSMATCH)

## 2022-02-15 MED ORDER — GUAIFENESIN-DM 100-10 MG/5ML PO SYRP
10.0000 mL | ORAL_SOLUTION | ORAL | Status: DC | PRN
  Administered 2022-02-15 – 2022-02-17 (×4): 10 mL via ORAL
  Filled 2022-02-15 (×4): qty 10

## 2022-02-15 MED ORDER — AMOXICILLIN-POT CLAVULANATE 875-125 MG PO TABS
1.0000 | ORAL_TABLET | Freq: Two times a day (BID) | ORAL | Status: DC
Start: 2022-02-15 — End: 2022-02-17
  Administered 2022-02-15 – 2022-02-17 (×4): 1 via ORAL
  Filled 2022-02-15 (×4): qty 1

## 2022-02-15 MED ORDER — SODIUM CHLORIDE 0.9% IV SOLUTION: Freq: Once | INTRAVENOUS | Status: AC

## 2022-02-15 MED ORDER — HYDROMORPHONE 1 MG/ML IV SOLN
INTRAVENOUS | Status: DC
  Administered 2022-02-15: 2 mg via INTRAVENOUS
  Administered 2022-02-15: 3 mg via INTRAVENOUS
  Administered 2022-02-16: 3.5 mg via INTRAVENOUS
  Administered 2022-02-16: 5.5 mg via INTRAVENOUS
  Administered 2022-02-16: 30 mg via INTRAVENOUS
  Administered 2022-02-16: 4 mg via INTRAVENOUS
  Filled 2022-02-15: qty 30

## 2022-02-16 DIAGNOSIS — D638 Anemia in other chronic diseases classified elsewhere: Secondary | ICD-10-CM | POA: Diagnosis not present

## 2022-02-16 DIAGNOSIS — J189 Pneumonia, unspecified organism: Secondary | ICD-10-CM | POA: Diagnosis not present

## 2022-02-16 DIAGNOSIS — R911 Solitary pulmonary nodule: Secondary | ICD-10-CM | POA: Diagnosis not present

## 2022-02-16 DIAGNOSIS — D5700 Hb-SS disease with crisis, unspecified: Secondary | ICD-10-CM | POA: Diagnosis not present

## 2022-02-16 LAB — CBC
HCT: 24.8 % — ABNORMAL LOW (ref 39.0–52.0)
Hemoglobin: 8.5 g/dL — ABNORMAL LOW (ref 13.0–17.0)
MCH: 28.8 pg (ref 26.0–34.0)
MCHC: 34.3 g/dL (ref 30.0–36.0)
MCV: 84.1 fL (ref 80.0–100.0)
Platelets: 432 10*3/uL — ABNORMAL HIGH (ref 150–400)
RBC: 2.95 MIL/uL — ABNORMAL LOW (ref 4.22–5.81)
RDW: 17.6 % — ABNORMAL HIGH (ref 11.5–15.5)
WBC: 13.1 10*3/uL — ABNORMAL HIGH (ref 4.0–10.5)
nRBC: 4.1 % — ABNORMAL HIGH (ref 0.0–0.2)

## 2022-02-16 MED ORDER — HYDROMORPHONE 1 MG/ML IV SOLN
INTRAVENOUS | Status: DC
  Administered 2022-02-16: 3.6 mg via INTRAVENOUS
  Administered 2022-02-16: 3 mg via INTRAVENOUS
  Administered 2022-02-16: 0.8 mg via INTRAVENOUS
  Administered 2022-02-17: 1.6 mg via INTRAVENOUS
  Administered 2022-02-17: 3.6 mg via INTRAVENOUS
  Administered 2022-02-17: 3.2 mg via INTRAVENOUS

## 2022-02-16 NOTE — Progress Notes (Signed)
Patient ID: Jonathan [name], male   DOB: 01/09/2001, 21 y.o.   MRN: [id number] Subjective: Jonathan [name] is a 21 year old male with a medical history significant for sickle cell disease, chronic pain syndrome, opiate dependence, and anemia of chronic disease was admitted for sickle cell pain crisis.  Patient is doing much better today.  Pain is now below 5 and he is able to ambulate but still needs another night in the hospital.  He has no new complaints today.  He still coughing out rusty brown sputum but no more chest pain.  He denies any fever, shortness of breath, nausea, vomiting or diarrhea.  Objective:  Vital signs in last 24 hours:  Vitals:   02/16/22 0419 02/16/22 0536 02/16/22 0845 02/16/22 1137  BP:  131/70 128/79   Pulse:  73 83   Resp: 16 18 16 15   Temp:  98.5 F (36.9 C) 98.1 F (36.7 C)   TempSrc:  Oral Oral   SpO2: 98% 99% 99% 98%  Weight:      Height:        Intake/Output from previous day:   Intake/Output Summary (Last 24 hours) at 02/16/2022 1408 Last data filed at 02/16/2022 0900 Gross per 24 hour  Intake 2029.5 ml  Output 600 ml  Net 1429.5 ml     Physical Exam: General: Alert, awake, oriented x3, in no acute distress.  HEENT: [place]/AT PEERL, EOMI Neck: Trachea midline,  no masses, no thyromegal,y no JVD, no carotid bruit OROPHARYNX:  Moist, No exudate/ erythema/lesions.  Heart: Regular rate and rhythm, without murmurs, rubs, gallops, PMI non-displaced, no heaves or thrills on palpation.  Lungs: Clear to auscultation, no wheezing or rhonchi noted. No increased vocal fremitus resonant to percussion  Abdomen: Soft, nontender, nondistended, positive bowel sounds, no masses no hepatosplenomegaly noted..  Neuro: No focal neurological deficits noted cranial nerves II through XII grossly intact. DTRs 2+ bilaterally upper and lower extremities. Strength 5 out of 5 in bilateral upper and lower extremities. Musculoskeletal: No warm swelling or erythema around  joints, no spinal tenderness noted. Psychiatric: Patient alert and oriented x3, good insight and cognition, good recent to remote recall. Lymph node survey: No cervical axillary or inguinal lymphadenopathy noted.  Lab Results:  Basic Metabolic Panel:    Component Value Date/Time   NA 137 02/13/2022 1007   NA 139 08/06/2021 1030   K 4.0 02/13/2022 1007   CL 103 02/13/2022 1007   CO2 27 02/13/2022 1007   BUN 9 02/13/2022 1007   BUN 9 08/06/2021 1030   CREATININE 0.74 02/13/2022 1007   CREATININE 0.70 05/12/2016 1052   GLUCOSE 106 (H) 02/13/2022 1007   CALCIUM 9.0 02/13/2022 1007   CBC:    Component Value Date/Time   WBC 13.1 (H) 02/16/2022 0146   HGB 8.5 (L) 02/16/2022 0146   HGB 7.6 (L) 08/06/2021 1030   HCT 24.8 (L) 02/16/2022 0146   HCT 23.6 (L) 08/06/2021 1030   PLT 432 (H) 02/16/2022 0146   PLT 958 (HH) 08/06/2021 1030   MCV 84.1 02/16/2022 0146   MCV 96 08/06/2021 1030   NEUTROABS 10.1 (H) 02/15/2022 1201   NEUTROABS 3.4 08/06/2021 1030   LYMPHSABS 2.5 02/15/2022 1201   LYMPHSABS 2.4 08/06/2021 1030   MONOABS 1.4 (H) 02/15/2022 1201   EOSABS 0.2 02/15/2022 1201   EOSABS 0.2 08/06/2021 1030   BASOSABS 0.0 02/15/2022 1201   BASOSABS 0.1 08/06/2021 1030    No results found for this or any previous visit (from the past  240 hour(s)).  Studies/Results: No results found.  Medications: Scheduled Meds:  amoxicillin-clavulanate  1 tablet Oral Q12H   enoxaparin (LOVENOX) injection  40 mg Subcutaneous Q24H   HYDROmorphone   Intravenous Q4H   senna-docusate  1 tablet Oral BID   Continuous Infusions:  sodium chloride 10 mL/hr at 02/16/22 0900   PRN Meds:.guaiFENesin-dextromethorphan, hydrOXYzine, naloxone **AND** sodium chloride flush, ondansetron, oxyCODONE, polyethylene glycol  Consultants: None  Procedures: None  Antibiotics: IV ceftriaxone IV azithromycin  Assessment/Plan: Principal Problem:   Sickle cell crisis (HCC) Active Problems:   Sickle cell  pain crisis (HCC)   Anemia of chronic disease   Community acquired pneumonia   Pulmonary nodule  Hb Sickle Cell Disease with Pain crisis: Continue IVF at [facility].  Begin to wean off weight based Dilaudid PCA, continue IV Toradol 15 mg Q 6 H for total of 5 days, continue oral pain medications as ordered.  Monitor vitals very closely. Re-evaluate pain scale regularly, 2 L of Oxygen by [facility]. Community-acquired pneumonia versus acute chest syndrome: Patient continues to maintain his oxygenation without significant supplemental oxygen needed.  Continue oral Augmentin.  Continue cough expectorants and other adjunct therapies.  Continue incentive spirometry. Orthostatic tachycardia: Significantly improved after exchange transfusion. We will continue to monitor very closely. Anemia of Chronic Disease: Hemoglobin has improved to 8.5 this morning.  Patient shows significant symptomatic improvement.  There is no further indication for blood transfusion today. Monitor very closely and repeat labs in a.m. Chronic pain Syndrome: Continue oral home medications.  Code Status: Full Code Family Communication: N/A Disposition Plan: Not yet ready for discharge  Kirill Chatterjee  If 7PM-7AM, please contact night-coverage.  02/16/2022, 2:08 PM  LOS: 4 days

## 2022-02-17 ENCOUNTER — Encounter: Payer: Self-pay | Admitting: Family Medicine

## 2022-02-17 LAB — TYPE AND SCREEN
ABO/RH(D): O POS
Antibody Screen: NEGATIVE
Unit division: 0
Unit division: 0
Unit division: 0

## 2022-02-17 LAB — BPAM RBC
Blood Product Expiration Date: 202307062359
Blood Product Expiration Date: 202307062359
Blood Product Expiration Date: 202307242359
ISSUE DATE / TIME: 202306241626
ISSUE DATE / TIME: 202306241953
Unit Type and Rh: 5100
Unit Type and Rh: 5100
Unit Type and Rh: 5100

## 2022-02-17 LAB — BASIC METABOLIC PANEL
Anion gap: 11 (ref 5–15)
BUN: 9 mg/dL (ref 6–20)
CO2: 28 mmol/L (ref 22–32)
Calcium: 9.2 mg/dL (ref 8.9–10.3)
Chloride: 101 mmol/L (ref 98–111)
Creatinine, Ser: 0.7 mg/dL (ref 0.61–1.24)
GFR, Estimated: >60 mL/min (ref >60)
Glucose, Bld: 124 mg/dL — ABNORMAL HIGH (ref 70–99)
Potassium: 3.8 mmol/L (ref 3.5–5.1)
Sodium: 140 mmol/L (ref 135–145)

## 2022-02-17 LAB — CBC WITH DIFFERENTIAL/PLATELET
Abs Immature Granulocytes: 0.04 10*3/uL (ref 0.00–0.07)
Basophils Absolute: 0.1 10*3/uL (ref 0.0–0.1)
Basophils Relative: 1 %
Eosinophils Absolute: 0.4 10*3/uL (ref 0.0–0.5)
Eosinophils Relative: 4 %
HCT: 26.1 % — ABNORMAL LOW (ref 39.0–52.0)
Hemoglobin: 8.6 g/dL — ABNORMAL LOW (ref 13.0–17.0)
Immature Granulocytes: 0 %
Lymphocytes Relative: 27 %
Lymphs Abs: 2.6 10*3/uL (ref 0.7–4.0)
MCH: 28.1 pg (ref 26.0–34.0)
MCHC: 33 g/dL (ref 30.0–36.0)
MCV: 85.3 fL (ref 80.0–100.0)
Monocytes Absolute: 0.9 10*3/uL (ref 0.1–1.0)
Monocytes Relative: 10 %
Neutro Abs: 5.6 10*3/uL (ref 1.7–7.7)
Neutrophils Relative %: 58 %
Platelets: 460 10*3/uL — ABNORMAL HIGH (ref 150–400)
RBC: 3.06 MIL/uL — ABNORMAL LOW (ref 4.22–5.81)
RDW: 18.6 % — ABNORMAL HIGH (ref 11.5–15.5)
WBC: 9.6 10*3/uL (ref 4.0–10.5)
nRBC: 4.6 % — ABNORMAL HIGH (ref 0.0–0.2)

## 2022-02-17 MED ORDER — IBUPROFEN 800 MG PO TABS: 800.0000 mg | ORAL_TABLET | Freq: Four times a day (QID) | ORAL | 2 refills | Status: DC | PRN

## 2022-02-17 MED ORDER — AMOXICILLIN-POT CLAVULANATE 875-125 MG PO TABS: 1.0000 | ORAL_TABLET | Freq: Two times a day (BID) | ORAL | 0 refills | Status: DC

## 2022-02-17 NOTE — Discharge Summary (Signed)
Physician Discharge Summary  Jonathan [name] XLK:[id number] DOB: 2001-05-07 DOA: 02/11/2022  PCP: Jonathan Angst, MD  Admit date: 02/11/2022  Discharge date: 02/17/2022  Discharge Diagnoses:  Principal Problem:   Sickle cell crisis (HCC) Active Problems:   Sickle cell pain crisis (HCC)   Anemia of chronic disease   Community acquired pneumonia   Pulmonary nodule   Discharge Condition: Stable  Disposition:   Follow-up Information     Jonathan Angst, MD Follow up in 1 week(s).   Specialty: Internal Medicine Contact information: [address] [name] [address] [phone]         Jonathan Drain, MD .   Specialty: Pediatrics Contact information: [address] [place] [address] [phone]                Pt is discharged home in good condition and is to follow up with Jonathan Angst, MD this week to have labs evaluated. Jonathan [name] is instructed to increase activity slowly and balance with rest for the next few days, and use prescribed medication to complete treatment of pain  Diet: Regular Wt Readings from Last 3 Encounters:  02/12/22 66.9 kg  12/07/21 67.4 kg  12/02/21 68 kg    History of present illness:  Jonathan [name] is a 21 year old male with a medical history significant of sickle cell disease, chronic pain syndrome, anemia of chronic disease who presents with lower extremity and back pain.  Patient's pain is consistent with his typical sickle cell crisis.  He was discharged yesterday from sickle cell clinic, but pain returned and was unrelieved by his home oxycodone.  Also, noted brief episode of chest pain with standing.  Currently patient denies any chest pain.  He also reports 3 days of cold symptoms with a productive cough. In the ED, he had a temperature of 100 F, he was tachycardic and normotensive on room air.  He has chronic leukocytosis with 15,000 on presentation.  Hemoglobin at baseline, 9.5 g/dL.   Chronically elevated total bilirubin stable. CTA chest obtained was negative for pulmonary embolism.  Bilateral lower lobe atelectasis and 4 mm noncalcified left upper lobe lung nodule.  Hospital Course:  Sickle cell disease with pain crisis: Patient was admitted for sickle cell pain crisis and managed appropriately with IVF, IV Dilaudid via PCA and IV Toradol, as well as other adjunct therapies per sickle cell pain management protocols.  IV Dilaudid PCA was weaned appropriately.  Patient's pain intensity is 3/10.  Patient is ready for discharge home.  He is not in need of medication refills today.  Community-acquired pneumonia: Patient afebrile.  Treated with IV antibiotics.  Transition to Augmentin 875-125 mg every 12 hours for an additional 7 days.  Continue incentive spirometer.  Patient is alert, oriented, and ambulating without assistance.  He will follow-up with PCP for medication management.  Also, he will follow-up in 1 week to repeat CBC with differential and CMP.  Patient is also scheduled for Jonathan [name] infusions monthly, will continue.  Patient was therefore discharged home today in a hemodynamically stable condition.   Jonathan [name] will follow-up with PCP within 1 week of this discharge. Jonathan [name] was counseled extensively about nonpharmacologic means of pain management, patient verbalized understanding and was appreciative of  the care received during this admission.   We discussed the need for good hydration, monitoring of hydration status, avoidance of heat, cold, stress, and infection triggers. We discussed the need to be adherent with taking Hydrea and other  home medications. Patient was reminded of the need to seek medical attention immediately if any symptom of bleeding, anemia, or infection occurs.  Discharge Exam: Vitals:   02/17/22 0943 02/17/22 1213  BP: 139/76   Pulse: 78   Resp: 16 15  Temp: 98.1 F (36.7 C)   SpO2: 99% 97%   Vitals:   02/17/22 0627 02/17/22 0804  02/17/22 0943 02/17/22 1213  BP: 126/76  139/76   Pulse: 90  78   Resp: 18 18 16 15   Temp: 98.3 F (36.8 C)  98.1 F (36.7 C)   TempSrc: Oral  Oral   SpO2: 96% 95% 99% 97%  Weight:      Height:        General appearance : Awake, alert, not in any distress. Speech Clear. Not toxic looking HEENT: Atraumatic and Normocephalic, pupils equally reactive to light and accomodation Neck: Supple, no JVD. No cervical lymphadenopathy.  Chest: Good air entry bilaterally, no added sounds  CVS: S1 S2 regular, no murmurs.  Abdomen: Bowel sounds present, Non tender and not distended with no gaurding, rigidity or rebound. Extremities: B/L Lower Ext shows no edema, both legs are warm to touch Neurology: Awake alert, and oriented X 3, CN II-XII intact, Non focal Skin: No Rash  Discharge Instructions  Discharge Instructions     Discharge patient   Complete by: As directed    Discharge disposition: 01-Home or Self Care   Discharge patient date: 02/17/2022      Allergies as of 02/17/2022       Reactions   Allergy Medication [diphenhydramine] Swelling, Other (See Comments)   Face swells        Medication List     TAKE these medications    amoxicillin-clavulanate 875-125 MG tablet Commonly known as: AUGMENTIN Take 1 tablet by mouth every 12 (twelve) hours.   ergocalciferol 1.25 MG (50000 UT) capsule Commonly known as: Drisdol Take 1 capsule (50,000 Units total) by mouth once a week.   folic acid 1 MG tablet Commonly known as: FOLVITE Take 1 tablet (1 mg total) by mouth daily. TAKE 1 TABLET BY MOUTH DAILY Strength 1 mg   ibuprofen 800 MG tablet Commonly known as: ADVIL Take 1 tablet (800 mg total) by mouth every 6 (six) hours as needed for moderate pain.   Oxycodone HCl 10 MG Tabs Take 1 tablet (10 mg total) by mouth every 4 (four) hours as needed (pain).        The results of significant diagnostics from this hospitalization (including imaging, microbiology, ancillary and  laboratory) are listed below for reference.    Significant Diagnostic Studies: CT Angio Chest PE W and/or Wo Contrast  Result Date: 02/12/2022 CLINICAL DATA:  History of sickle cell disease presenting with chest pain and right leg pain. EXAM: CT ANGIOGRAPHY CHEST WITH CONTRAST TECHNIQUE: Multidetector CT imaging of the chest was performed using the standard protocol during bolus administration of intravenous contrast. Multiplanar CT image reconstructions and MIPs were obtained to evaluate the vascular anatomy. RADIATION DOSE REDUCTION: This exam was performed according to the departmental dose-optimization program which includes automated exposure control, adjustment of the mA and/or kV according to patient size and/or use of iterative reconstruction technique. CONTRAST:  46mL OMNIPAQUE IOHEXOL 350 MG/ML SOLN COMPARISON:  None Available. FINDINGS: Cardiovascular: Satisfactory opacification of the pulmonary arteries to the segmental level. No evidence of pulmonary embolism. Normal heart size. No pericardial effusion. Mediastinum/Nodes: No enlarged mediastinal, hilar, or axillary lymph nodes. Thyroid gland, trachea, and esophagus demonstrate  no significant findings. Lungs/Pleura: Mild lingular and bilateral lower lobe atelectasis is seen. A 4 mm noncalcified left upper lobe lung nodule is noted (axial CT image 55, CT series 10). There is no evidence of a pleural effusion or pneumothorax. Upper Abdomen: No acute abnormality. Musculoskeletal: Diffusely sclerotic osseous structures are seen consistent with the patient's known history of sickle cell disease. Review of the MIP images confirms the above findings. IMPRESSION: 1. No CT evidence of pulmonary embolism. 2. Mild lingular and bilateral lower lobe atelectasis. 3. 4 mm noncalcified left upper lobe lung nodule. No follow-up needed if patient is low-risk.This recommendation follows the consensus statement: Guidelines for Management of Incidental Pulmonary Nodules  Detected on CT Images: From the Fleischner Society 2017; Radiology 2017; 284:228-243. 4. Diffusely sclerotic osseous structures consistent with the patient's known history of sickle cell disease. Electronically Signed   By: Aram Candela M.D.   On: 02/12/2022 00:07   DG Chest Portable 1 View  Result Date: 02/11/2022 CLINICAL DATA:  Chest pain, sickle cell crisis. EXAM: PORTABLE CHEST 1 VIEW COMPARISON:  10/11/2021. FINDINGS: The heart size and mediastinal contours are within normal limits. Lung volumes are low with strandy opacities at the lung bases. No effusion or pneumothorax. No acute osseous abnormality. IMPRESSION: Mild strandy opacities at the lung bases, slightly increased from the prior exam, possible atelectasis or infiltrate. Electronically Signed   By: Thornell Sartorius M.D.   On: 02/11/2022 22:57    Microbiology: No results found for this or any previous visit (from the past 240 hour(s)).   Labs: Basic Metabolic Panel: Recent Labs  Lab 02/11/22 0903 02/11/22 2316 02/13/22 1007 02/17/22 0543  NA 140 137 137 140  K 3.8 4.2 4.0 3.8  CL 105 105 103 101  CO2 22 25 27 28   GLUCOSE 84 118* 106* 124*  BUN 6 8 9 9   CREATININE 0.81 0.86 0.74 0.70  CALCIUM 9.6 9.2 9.0 9.2   Liver Function Tests: Recent Labs  Lab 02/11/22 0903 02/11/22 2316 02/13/22 1007  AST 36 35 26  ALT 11 14 12   ALKPHOS 144* 144* 150*  BILITOT 1.6* 2.3* 1.5*  PROT 8.2* 7.9 7.3  ALBUMIN 4.5 4.3 3.8   No results for input(s): "LIPASE", "AMYLASE" in the last 168 hours. No results for input(s): "AMMONIA" in the last 168 hours. CBC: Recent Labs  Lab 02/11/22 0903 02/11/22 2316 02/13/22 1007 02/15/22 1201 02/16/22 0146 02/17/22 0543  WBC 14.6* 15.0* 16.9* 14.3* 13.1* 9.6  NEUTROABS 9.3* 11.9* 12.9* 10.1*  --  5.6  HGB 10.2* 9.5* 8.0* 6.8* 8.5* 8.6*  HCT 31.2* 28.4* 24.2* 21.0* 24.8* 26.1*  MCV 89.1 88.5 89.6 89.0 84.1 85.3  PLT 304 433* 390 418* 432* 460*   Cardiac Enzymes: No results for  input(s): "CKTOTAL", "CKMB", "CKMBINDEX", "TROPONINI" in the last 168 hours. BNP: Invalid input(s): "POCBNP" CBG: No results for input(s): "GLUCAP" in the last 168 hours.  Time coordinating discharge: 30 minutes  Signed:  02/17/22  APRN, MSN, FNP-C Patient Care [facility] [address] [place], [facility] [address] [phone]  [facility] 02/17/2022, 1:29 PM

## 2022-02-20 ENCOUNTER — Telehealth: Payer: Self-pay | Admitting: *Deleted

## 2022-02-20 NOTE — Telephone Encounter (Signed)
Transition Care Management Unsuccessful Follow-up Telephone Call  Date of discharge and from where:  02/17/22  FROM [facility]  Attempts:  1st Attempt  Reason for unsuccessful TCM follow-up call:  Left voice message   Rhae Lerner RN, MSN RN Care Management Coordinator  [phone] [email or web address]

## 2022-02-26 ENCOUNTER — Non-Acute Institutional Stay (HOSPITAL_COMMUNITY): Admit: 2022-02-26 | Payer: Commercial Managed Care - PPO

## 2022-02-26 ENCOUNTER — Other Ambulatory Visit: Payer: Self-pay | Admitting: Family Medicine

## 2022-02-26 DIAGNOSIS — D571 Sickle-cell disease without crisis: Secondary | ICD-10-CM

## 2022-03-05 NOTE — Telephone Encounter (Signed)
Error

## 2022-03-25 ENCOUNTER — Non-Acute Institutional Stay (HOSPITAL_COMMUNITY)
Admission: RE | Admit: 2022-03-25 | Discharge: 2022-03-25 | Disposition: A | Discharge status: Home / Self Care | Payer: Commercial Managed Care - PPO | Source: Ambulatory Visit | Attending: Internal Medicine | Admitting: Internal Medicine

## 2022-03-25 DIAGNOSIS — D571 Sickle-cell disease without crisis: Secondary | ICD-10-CM | POA: Diagnosis present

## 2022-03-25 LAB — RETICULOCYTES
Immature Retic Fract: 48.9 % — ABNORMAL HIGH (ref 2.3–15.9)
RBC.: 3.18 MIL/uL — ABNORMAL LOW (ref 4.22–5.81)
Retic Count, Absolute: 263.3 10*3/uL — ABNORMAL HIGH (ref 19.0–186.0)
Retic Ct Pct: 8.3 % — ABNORMAL HIGH (ref 0.4–3.1)

## 2022-03-25 LAB — CBC WITH DIFFERENTIAL/PLATELET
Abs Immature Granulocytes: 0.03 10*3/uL (ref 0.00–0.07)
Basophils Absolute: 0.1 10*3/uL (ref 0.0–0.1)
Basophils Relative: 1 %
Eosinophils Absolute: 0.2 10*3/uL (ref 0.0–0.5)
Eosinophils Relative: 2 %
HCT: 29.1 % — ABNORMAL LOW (ref 39.0–52.0)
Hemoglobin: 9.9 g/dL — ABNORMAL LOW (ref 13.0–17.0)
Immature Granulocytes: 0 %
Lymphocytes Relative: 34 %
Lymphs Abs: 4.1 10*3/uL — ABNORMAL HIGH (ref 0.7–4.0)
MCH: 31 pg (ref 26.0–34.0)
MCHC: 34 g/dL (ref 30.0–36.0)
MCV: 91.2 fL (ref 80.0–100.0)
Monocytes Absolute: 0.8 10*3/uL (ref 0.1–1.0)
Monocytes Relative: 7 %
Neutro Abs: 7 10*3/uL (ref 1.7–7.7)
Neutrophils Relative %: 56 %
Platelets: 354 10*3/uL (ref 150–400)
RBC: 3.19 MIL/uL — ABNORMAL LOW (ref 4.22–5.81)
RDW: 21.2 % — ABNORMAL HIGH (ref 11.5–15.5)
WBC: 12.3 10*3/uL — ABNORMAL HIGH (ref 4.0–10.5)
nRBC: 2.7 % — ABNORMAL HIGH (ref 0.0–0.2)

## 2022-03-25 MED ORDER — SODIUM CHLORIDE 0.9 % IV SOLN: INTRAVENOUS | Status: DC | PRN

## 2022-03-25 MED ORDER — SODIUM CHLORIDE 0.9 % IV SOLN
5.0000 mg/kg | Freq: Once | INTRAVENOUS | Status: AC
  Administered 2022-03-25: 350 mg via INTRAVENOUS
  Filled 2022-03-25: qty 35

## 2022-03-25 NOTE — Progress Notes (Signed)
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 Labs drawn prior to infusion (CBC w/diff and Retic).  Patient tolerated infusion well with no adverse reaction. Flushed line with 25 cc NS after infusion completed per protocol. Observed the patient for 15 minutes post infusion. Vital signs stable.  AVS offered but patient refused. Patient advised to scheduled next appointment for 1 month.  Patient alert, oriented and ambulatory at discharge.

## 2022-03-31 ENCOUNTER — Other Ambulatory Visit: Payer: Self-pay | Admitting: Family Medicine

## 2022-03-31 ENCOUNTER — Telehealth: Payer: Self-pay

## 2022-03-31 DIAGNOSIS — Z79891 Long term (current) use of opiate analgesic: Secondary | ICD-10-CM

## 2022-03-31 DIAGNOSIS — D571 Sickle-cell disease without crisis: Secondary | ICD-10-CM

## 2022-03-31 MED ORDER — OXYCODONE HCL 10 MG PO TABS: 10.0000 mg | ORAL_TABLET | ORAL | 0 refills | Status: DC | PRN

## 2022-03-31 MED ORDER — IBUPROFEN 800 MG PO TABS: 800.0000 mg | ORAL_TABLET | Freq: Four times a day (QID) | ORAL | 2 refills | Status: DC | PRN

## 2022-03-31 NOTE — Progress Notes (Signed)
Reviewed PDMP substance reporting system prior to prescribing opiate medications. No inconsistencies noted.  Meds ordered this encounter  Medications   Oxycodone HCl 10 MG TABS    Sig: Take 1 tablet (10 mg total) by mouth every 4 (four) hours as needed (pain).    Dispense:  60 tablet    Refill:  0    Order Specific Question:   Supervising Provider    Answer:   Quentin Angst [[id number]]   ibuprofen (ADVIL) 800 MG tablet    Sig: Take 1 tablet (800 mg total) by mouth every 6 (six) hours as needed for moderate pain.    Dispense:  30 tablet    Refill:  2    Order Specific Question:   Supervising Provider    Answer:   Quentin Angst [[id number]]      Jonathan Nations  APRN, MSN, FNP-C Patient Care [facility] [address] [place], [place] [address] [phone]

## 2022-03-31 NOTE — Telephone Encounter (Signed)
Ibuprofen Oxycodone     

## 2022-04-25 ENCOUNTER — Other Ambulatory Visit: Payer: Self-pay | Admitting: Family Medicine

## 2022-04-25 ENCOUNTER — Encounter (HOSPITAL_COMMUNITY): Payer: Commercial Managed Care - PPO

## 2022-04-25 DIAGNOSIS — D571 Sickle-cell disease without crisis: Secondary | ICD-10-CM

## 2022-05-05 ENCOUNTER — Other Ambulatory Visit: Payer: Self-pay | Admitting: Family Medicine

## 2022-05-05 ENCOUNTER — Telehealth (HOSPITAL_COMMUNITY): Payer: Self-pay

## 2022-05-05 DIAGNOSIS — Z79891 Long term (current) use of opiate analgesic: Secondary | ICD-10-CM

## 2022-05-05 DIAGNOSIS — D571 Sickle-cell disease without crisis: Secondary | ICD-10-CM

## 2022-05-05 MED ORDER — OXYCODONE HCL 10 MG PO TABS: 10.0000 mg | ORAL_TABLET | ORAL | 0 refills | Status: DC | PRN

## 2022-05-05 NOTE — Telephone Encounter (Signed)
Patient called in. Complains of pain in R leg rates 7/10. Denied chest pain, abd pain, fever, N/V/D. Wants to come in for treatment. Last took Ibuprofen at 5am. Pt states he has been out of oxycodone x2 months, has been leaving [facility] but has not had a received a refill of medication.  Pt states his mother is his transportation. Pt states he has not had an recent visits to the ED. Pt denies recent covid exposure or flu like symptoms today. [place] FNP made aware, pt not approved to be seen in the day hospital today. Provider to send prescription for pain meds to pharmacy, instruct pt to take home meds and call the day hospital in the morning if he does not have adequate relief overnight. Pt notified , verbalized understanding that he will pick up prescription and manage pain at home today.

## 2022-05-05 NOTE — Progress Notes (Signed)
Reviewed PDMP substance reporting system prior to prescribing opiate medications. No inconsistencies noted.   Meds ordered this encounter  Medications  . Oxycodone HCl 10 MG TABS    Sig: Take 1 tablet (10 mg total) by mouth every 4 (four) hours as needed (pain).    Dispense:  60 tablet    Refill:  0    Order Specific Question:   Supervising Provider    Answer:   JEGEDE, OLUGBEMIGA E [[id number]]     Jonathan Fortson Moore Kie Calvin  APRN, MSN, FNP-C Patient Care [facility] [address]  [place], [place] [address] [phone]  

## 2022-05-07 ENCOUNTER — Non-Acute Institutional Stay (HOSPITAL_COMMUNITY)
Admission: RE | Admit: 2022-05-07 | Discharge: 2022-05-07 | Disposition: A | Discharge status: Home / Self Care | Payer: Commercial Managed Care - PPO | Source: Ambulatory Visit | Attending: Internal Medicine | Admitting: Internal Medicine

## 2022-05-07 DIAGNOSIS — D571 Sickle-cell disease without crisis: Secondary | ICD-10-CM | POA: Insufficient documentation

## 2022-05-07 LAB — CBC WITH DIFFERENTIAL/PLATELET
Abs Immature Granulocytes: 0.05 10*3/uL (ref 0.00–0.07)
Basophils Absolute: 0.1 10*3/uL (ref 0.0–0.1)
Basophils Relative: 1 %
Eosinophils Absolute: 0.5 10*3/uL (ref 0.0–0.5)
Eosinophils Relative: 4 %
HCT: 26.1 % — ABNORMAL LOW (ref 39.0–52.0)
Hemoglobin: 8.9 g/dL — ABNORMAL LOW (ref 13.0–17.0)
Immature Granulocytes: 1 %
Lymphocytes Relative: 23 %
Lymphs Abs: 2.5 10*3/uL (ref 0.7–4.0)
MCH: 32.4 pg (ref 26.0–34.0)
MCHC: 34.1 g/dL (ref 30.0–36.0)
MCV: 94.9 fL (ref 80.0–100.0)
Monocytes Absolute: 1.3 10*3/uL — ABNORMAL HIGH (ref 0.1–1.0)
Monocytes Relative: 12 %
Neutro Abs: 6.6 10*3/uL (ref 1.7–7.7)
Neutrophils Relative %: 59 %
Platelets: 286 10*3/uL (ref 150–400)
RBC: 2.75 MIL/uL — ABNORMAL LOW (ref 4.22–5.81)
RDW: 18.9 % — ABNORMAL HIGH (ref 11.5–15.5)
WBC: 11 10*3/uL — ABNORMAL HIGH (ref 4.0–10.5)
nRBC: 1.4 % — ABNORMAL HIGH (ref 0.0–0.2)

## 2022-05-07 MED ORDER — SODIUM CHLORIDE 0.9 % IV SOLN: INTRAVENOUS | Status: DC | PRN

## 2022-05-07 MED ORDER — SODIUM CHLORIDE 0.9 % IV SOLN
5.0000 mg/kg | Freq: Once | INTRAVENOUS | Status: AC
  Administered 2022-05-07: 350 mg via INTRAVENOUS
  Filled 2022-05-07: qty 35

## 2022-05-07 NOTE — Progress Notes (Addendum)
PATIENT CARE CENTER NOTE     Diagnosis: Sickle Cell Disease      Provider: Hollis, [place], FNP      Procedure: Nettie Elm 350mg  infusion and lab draw     Note:  Patient received monthly Adakveo infusion via PIV. No premeds required per orders. CBC drawn prior to infusion. Tolerated infusion well. IV line flushed with 25cc NS after infusion completed per protocol. Pt observed for 15 minutes after infusion completed, no s/s reaction noted. Patient instructed to schedule next infusion in 1 month prior to leaving, verbalized understanding. AVS offered but patient refused. Patient alert, oriented and ambulatory at discharge.

## 2022-06-06 ENCOUNTER — Other Ambulatory Visit: Payer: Self-pay | Admitting: Family Medicine

## 2022-06-06 ENCOUNTER — Telehealth: Payer: Self-pay

## 2022-06-06 ENCOUNTER — Non-Acute Institutional Stay (HOSPITAL_COMMUNITY)
Admission: RE | Admit: 2022-06-06 | Discharge: 2022-06-06 | Disposition: A | Discharge status: Home / Self Care | Payer: Commercial Managed Care - PPO | Source: Ambulatory Visit | Attending: Internal Medicine | Admitting: Internal Medicine

## 2022-06-06 DIAGNOSIS — D571 Sickle-cell disease without crisis: Secondary | ICD-10-CM

## 2022-06-06 DIAGNOSIS — Z79891 Long term (current) use of opiate analgesic: Secondary | ICD-10-CM

## 2022-06-06 LAB — CBC WITH DIFFERENTIAL/PLATELET
Abs Immature Granulocytes: 0.03 10*3/uL (ref 0.00–0.07)
Basophils Absolute: 0.1 10*3/uL (ref 0.0–0.1)
Basophils Relative: 1 %
Eosinophils Absolute: 0.3 10*3/uL (ref 0.0–0.5)
Eosinophils Relative: 2 %
HCT: 29.2 % — ABNORMAL LOW (ref 39.0–52.0)
Hemoglobin: 9.7 g/dL — ABNORMAL LOW (ref 13.0–17.0)
Immature Granulocytes: 0 %
Lymphocytes Relative: 19 %
Lymphs Abs: 2.1 10*3/uL (ref 0.7–4.0)
MCH: 29.8 pg (ref 26.0–34.0)
MCHC: 33.2 g/dL (ref 30.0–36.0)
MCV: 89.6 fL (ref 80.0–100.0)
Monocytes Absolute: 1.2 10*3/uL — ABNORMAL HIGH (ref 0.1–1.0)
Monocytes Relative: 11 %
Neutro Abs: 7.4 10*3/uL (ref 1.7–7.7)
Neutrophils Relative %: 67 %
Platelets: 271 10*3/uL (ref 150–400)
RBC: 3.26 MIL/uL — ABNORMAL LOW (ref 4.22–5.81)
RDW: 18.1 % — ABNORMAL HIGH (ref 11.5–15.5)
WBC: 11.1 10*3/uL — ABNORMAL HIGH (ref 4.0–10.5)
nRBC: 0.4 % — ABNORMAL HIGH (ref 0.0–0.2)

## 2022-06-06 MED ORDER — SODIUM CHLORIDE 0.9 % IV SOLN: INTRAVENOUS | Status: DC | PRN

## 2022-06-06 MED ORDER — OXYCODONE HCL 10 MG PO TABS: 10.0000 mg | ORAL_TABLET | ORAL | 0 refills | Status: DC | PRN

## 2022-06-06 MED ORDER — SODIUM CHLORIDE 0.9 % IV SOLN
5.0000 mg/kg | Freq: Once | INTRAVENOUS | Status: AC
  Administered 2022-06-06: 345 mg via INTRAVENOUS
  Filled 2022-06-06: qty 34.5

## 2022-06-06 NOTE — Progress Notes (Signed)
PATIENT CARE CENTER NOTE  Diagnosis:Sickle Cell Disease   Provider: [place] Hollis, FNP  Procedure: Adakveo 350 mg infusion and lab draw  Note: Patient received Adakveo  infusion via PIV. CBC drawn prior to infusion. Tolerated infusion well. IV line flushed with 25cc NS after infusion completed per protocol. Pt observed for 15 minutes after infusion completed.  Patient instructed to schedule next infusion in 1 month prior to leaving, verbalized understanding. AVS given to patient. Patient alert, oriented and ambulatory at discharge.

## 2022-06-06 NOTE — Progress Notes (Signed)
Reviewed PDMP substance reporting system prior to prescribing opiate medications. No inconsistencies noted.   Meds ordered this encounter  Medications  . Oxycodone HCl 10 MG TABS    Sig: Take 1 tablet (10 mg total) by mouth every 4 (four) hours as needed (pain).    Dispense:  60 tablet    Refill:  0    Order Specific Question:   Supervising Provider    Answer:   JEGEDE, OLUGBEMIGA E [[id number]]     Ikaika Showers Moore Jillian Warth  APRN, MSN, FNP-C [facility] [address]  [place], [place] [address] [phone]  

## 2022-06-06 NOTE — Telephone Encounter (Signed)
Oxycodone  Ibuprofen  

## 2022-06-11 ENCOUNTER — Other Ambulatory Visit: Payer: Self-pay

## 2022-06-11 ENCOUNTER — Inpatient Hospital Stay (HOSPITAL_COMMUNITY)
Admission: EM | Admit: 2022-06-11 | Discharge: 2022-06-14 | DRG: 812 | Disposition: A | Discharge status: Home / Self Care | Payer: Commercial Managed Care - PPO | Attending: Internal Medicine | Admitting: Internal Medicine

## 2022-06-11 ENCOUNTER — Encounter (HOSPITAL_COMMUNITY): Payer: Self-pay

## 2022-06-11 ENCOUNTER — Emergency Department (HOSPITAL_COMMUNITY): Payer: Commercial Managed Care - PPO

## 2022-06-11 DIAGNOSIS — D5700 Hb-SS disease with crisis, unspecified: Principal | ICD-10-CM | POA: Diagnosis present

## 2022-06-11 DIAGNOSIS — Z888 Allergy status to other drugs, medicaments and biological substances status: Secondary | ICD-10-CM

## 2022-06-11 DIAGNOSIS — Z23 Encounter for immunization: Secondary | ICD-10-CM | POA: Diagnosis not present

## 2022-06-11 DIAGNOSIS — Z8249 Family history of ischemic heart disease and other diseases of the circulatory system: Secondary | ICD-10-CM

## 2022-06-11 DIAGNOSIS — D638 Anemia in other chronic diseases classified elsewhere: Secondary | ICD-10-CM | POA: Diagnosis present

## 2022-06-11 DIAGNOSIS — D72829 Elevated white blood cell count, unspecified: Secondary | ICD-10-CM | POA: Diagnosis present

## 2022-06-11 DIAGNOSIS — G8929 Other chronic pain: Secondary | ICD-10-CM

## 2022-06-11 DIAGNOSIS — Z9081 Acquired absence of spleen: Secondary | ICD-10-CM | POA: Diagnosis not present

## 2022-06-11 DIAGNOSIS — J4520 Mild intermittent asthma, uncomplicated: Secondary | ICD-10-CM | POA: Diagnosis present

## 2022-06-11 DIAGNOSIS — G894 Chronic pain syndrome: Secondary | ICD-10-CM | POA: Diagnosis present

## 2022-06-11 DIAGNOSIS — D571 Sickle-cell disease without crisis: Secondary | ICD-10-CM

## 2022-06-11 DIAGNOSIS — Z96642 Presence of left artificial hip joint: Secondary | ICD-10-CM | POA: Diagnosis present

## 2022-06-11 DIAGNOSIS — Z825 Family history of asthma and other chronic lower respiratory diseases: Secondary | ICD-10-CM | POA: Diagnosis not present

## 2022-06-11 DIAGNOSIS — Z79899 Other long term (current) drug therapy: Secondary | ICD-10-CM

## 2022-06-11 DIAGNOSIS — Z79891 Long term (current) use of opiate analgesic: Secondary | ICD-10-CM

## 2022-06-11 LAB — COMPREHENSIVE METABOLIC PANEL
ALT: 9 U/L (ref 0–44)
AST: 21 U/L (ref 15–41)
Albumin: 4.5 g/dL (ref 3.5–5.0)
Alkaline Phosphatase: 108 U/L (ref 38–126)
Anion gap: 8 (ref 5–15)
BUN: 12 mg/dL (ref 6–20)
CO2: 23 mmol/L (ref 22–32)
Calcium: 9.3 mg/dL (ref 8.9–10.3)
Chloride: 106 mmol/L (ref 98–111)
Creatinine, Ser: 1 mg/dL (ref 0.61–1.24)
GFR, Estimated: >60 mL/min (ref >60)
Glucose, Bld: 138 mg/dL — ABNORMAL HIGH (ref 70–99)
Potassium: 3.9 mmol/L (ref 3.5–5.1)
Sodium: 137 mmol/L (ref 135–145)
Total Bilirubin: 2.9 mg/dL — ABNORMAL HIGH (ref 0.3–1.2)
Total Protein: 8.1 g/dL (ref 6.5–8.1)

## 2022-06-11 LAB — URINALYSIS, ROUTINE W REFLEX MICROSCOPIC
Bacteria, UA: NONE SEEN
Bilirubin Urine: NEGATIVE
Glucose, UA: NEGATIVE mg/dL
Hgb urine dipstick: NEGATIVE
Ketones, ur: NEGATIVE mg/dL
Leukocytes,Ua: NEGATIVE
Nitrite: NEGATIVE
Protein, ur: 30 mg/dL — AB
Specific Gravity, Urine: >1.046 — ABNORMAL HIGH (ref 1.005–1.030)
pH: 5 (ref 5.0–8.0)

## 2022-06-11 LAB — CBC WITH DIFFERENTIAL/PLATELET
Abs Immature Granulocytes: 0.3 10*3/uL — ABNORMAL HIGH (ref 0.00–0.07)
Basophils Absolute: 0.2 10*3/uL — ABNORMAL HIGH (ref 0.0–0.1)
Basophils Relative: 1 %
Eosinophils Absolute: 0.1 10*3/uL (ref 0.0–0.5)
Eosinophils Relative: 1 %
HCT: 26.8 % — ABNORMAL LOW (ref 39.0–52.0)
Hemoglobin: 9.3 g/dL — ABNORMAL LOW (ref 13.0–17.0)
Immature Granulocytes: 1 %
Lymphocytes Relative: 11 %
Lymphs Abs: 2.5 10*3/uL (ref 0.7–4.0)
MCH: 29.7 pg (ref 26.0–34.0)
MCHC: 34.7 g/dL (ref 30.0–36.0)
MCV: 85.6 fL (ref 80.0–100.0)
Monocytes Absolute: 2.1 10*3/uL — ABNORMAL HIGH (ref 0.1–1.0)
Monocytes Relative: 10 %
Neutro Abs: 16.9 10*3/uL — ABNORMAL HIGH (ref 1.7–7.7)
Neutrophils Relative %: 76 %
Platelets: 371 10*3/uL (ref 150–400)
RBC: 3.13 MIL/uL — ABNORMAL LOW (ref 4.22–5.81)
RDW: 21.7 % — ABNORMAL HIGH (ref 11.5–15.5)
WBC: 22.1 10*3/uL — ABNORMAL HIGH (ref 4.0–10.5)
nRBC: 2.3 % — ABNORMAL HIGH (ref 0.0–0.2)

## 2022-06-11 LAB — LACTIC ACID, PLASMA
Lactic Acid, Venous: 2.6 mmol/L (ref 0.5–1.9)
Lactic Acid, Venous: 0.7 mmol/L (ref 0.5–1.9)

## 2022-06-11 LAB — RETICULOCYTES
Immature Retic Fract: 38.2 % — ABNORMAL HIGH (ref 2.3–15.9)
RBC.: 3.13 MIL/uL — ABNORMAL LOW (ref 4.22–5.81)
Retic Count, Absolute: 275 10*3/uL — ABNORMAL HIGH (ref 19.0–186.0)
Retic Ct Pct: 8.6 % — ABNORMAL HIGH (ref 0.4–3.1)

## 2022-06-11 MED ORDER — IOHEXOL 300 MG/ML  SOLN
100.0000 mL | Freq: Once | INTRAMUSCULAR | Status: AC | PRN
  Administered 2022-06-11: 100 mL via INTRAVENOUS

## 2022-06-11 MED ORDER — OXYCODONE HCL 5 MG PO TABS
10.0000 mg | ORAL_TABLET | ORAL | Status: DC | PRN
  Administered 2022-06-11 – 2022-06-14 (×9): 10 mg via ORAL
  Filled 2022-06-11 (×9): qty 2

## 2022-06-11 MED ORDER — HYDROMORPHONE HCL 1 MG/ML IJ SOLN
1.0000 mg | INTRAMUSCULAR | Status: DC | PRN
  Administered 2022-06-11: 1 mg via INTRAVENOUS
  Filled 2022-06-11: qty 1

## 2022-06-11 MED ORDER — KETOROLAC TROMETHAMINE 15 MG/ML IJ SOLN
15.0000 mg | Freq: Four times a day (QID) | INTRAMUSCULAR | Status: DC
  Administered 2022-06-11 – 2022-06-14 (×12): 15 mg via INTRAVENOUS
  Filled 2022-06-11 (×12): qty 1

## 2022-06-11 MED ORDER — ONDANSETRON HCL 4 MG/2ML IJ SOLN: 4.0000 mg | INTRAMUSCULAR | Status: DC | PRN

## 2022-06-11 MED ORDER — SODIUM CHLORIDE 0.45 % IV SOLN
INTRAVENOUS | Status: DC
  Administered 2022-06-11: 1000 mL via INTRAVENOUS

## 2022-06-11 MED ORDER — HYDROMORPHONE HCL 2 MG/ML IJ SOLN
2.0000 mg | INTRAMUSCULAR | Status: AC
  Administered 2022-06-11: 2 mg via INTRAVENOUS

## 2022-06-11 MED ORDER — SODIUM CHLORIDE (PF) 0.9 % IJ SOLN
INTRAMUSCULAR | Status: AC
  Filled 2022-06-11: qty 50

## 2022-06-11 MED ORDER — ENOXAPARIN SODIUM 40 MG/0.4ML IJ SOSY
40.0000 mg | PREFILLED_SYRINGE | Freq: Every day | INTRAMUSCULAR | Status: DC
  Administered 2022-06-11 – 2022-06-13 (×3): 40 mg via SUBCUTANEOUS
  Filled 2022-06-11 (×3): qty 0.4

## 2022-06-11 MED ORDER — HYDROMORPHONE HCL 2 MG/ML IJ SOLN
2.0000 mg | INTRAMUSCULAR | Status: AC
  Administered 2022-06-11: 2 mg via INTRAVENOUS
  Filled 2022-06-11: qty 1

## 2022-06-11 MED ORDER — FOLIC ACID 1 MG PO TABS
1.0000 mg | ORAL_TABLET | Freq: Every day | ORAL | Status: DC
  Administered 2022-06-11 – 2022-06-14 (×4): 1 mg via ORAL
  Filled 2022-06-11 (×4): qty 1

## 2022-06-11 MED ORDER — SENNOSIDES-DOCUSATE SODIUM 8.6-50 MG PO TABS
1.0000 | ORAL_TABLET | Freq: Two times a day (BID) | ORAL | Status: DC
  Administered 2022-06-11 – 2022-06-14 (×7): 1 via ORAL
  Filled 2022-06-11 (×7): qty 1

## 2022-06-11 MED ORDER — OXYCODONE-ACETAMINOPHEN 5-325 MG PO TABS
2.0000 | ORAL_TABLET | Freq: Once | ORAL | Status: AC
  Administered 2022-06-11: 2 via ORAL
  Filled 2022-06-11 (×2): qty 2

## 2022-06-11 MED ORDER — HYDROMORPHONE HCL 2 MG/ML IJ SOLN
2.0000 mg | INTRAMUSCULAR | Status: DC | PRN
  Administered 2022-06-11: 2 mg via INTRAVENOUS
  Filled 2022-06-11: qty 1

## 2022-06-11 MED ORDER — HYDROMORPHONE HCL 2 MG/ML IJ SOLN
INTRAMUSCULAR | Status: AC
  Administered 2022-06-11: 2 mg via INTRAVENOUS
  Filled 2022-06-11: qty 1

## 2022-06-11 MED ORDER — HYDROMORPHONE 1 MG/ML IV SOLN
INTRAVENOUS | Status: DC
  Administered 2022-06-11: 1 mg via INTRAVENOUS
  Administered 2022-06-11 – 2022-06-12 (×2): 5.5 mg via INTRAVENOUS
  Administered 2022-06-12: 2 mg via INTRAVENOUS
  Administered 2022-06-12: 4 mg via INTRAVENOUS
  Administered 2022-06-12: 3.5 mg via INTRAVENOUS
  Filled 2022-06-11: qty 30

## 2022-06-11 MED ORDER — POLYETHYLENE GLYCOL 3350 17 G PO PACK: 17.0000 g | PACK | Freq: Every day | ORAL | Status: DC | PRN

## 2022-06-11 MED ORDER — SODIUM CHLORIDE 0.9% FLUSH: 9.0000 mL | INTRAVENOUS | Status: DC | PRN

## 2022-06-11 MED ORDER — NALOXONE HCL 0.4 MG/ML IJ SOLN: 0.4000 mg | INTRAMUSCULAR | Status: DC | PRN

## 2022-06-11 MED ORDER — SODIUM CHLORIDE 0.9 % IV BOLUS
1000.0000 mL | Freq: Once | INTRAVENOUS | Status: AC
  Administered 2022-06-11: 1000 mL via INTRAVENOUS

## 2022-06-11 MED ORDER — KETOROLAC TROMETHAMINE 15 MG/ML IJ SOLN
15.0000 mg | Freq: Once | INTRAMUSCULAR | Status: AC
  Administered 2022-06-11: 15 mg via INTRAVENOUS
  Filled 2022-06-11: qty 1

## 2022-06-11 NOTE — ED Provider Notes (Signed)
[facility]-EMERGENCY DEPT Provider Note   CSN: [id number] Arrival date & time: 06/11/22  0510     History  Chief Complaint  Patient presents with   Sickle Cell Pain Crisis    Jonathan [name] is a 21 y.o. male.  HPI   Patient with medical history including sickle cell disease, avascular necrosis of the left hip presents with complaints of sickle cell pain crisis.  Patient  started yesterday, states he is feels pain in his lower back, states that this is typical for him but the pain is much worse than usual he denies any traumatic injury associate this pain, no saddle paresthesias no urinary bowel incontinency, denies any paresthesia or weakness moving down his leg no stomach pains no nausea no vomiting still passing gas have normal bowel movements no chest pain no shortness of breath no fevers or chills denies any illicit drug use.  He has been using at home medication not much relief.    Home Medications Prior to Admission medications   Medication Sig Start Date End Date Taking? Authorizing Provider  amoxicillin-clavulanate (AUGMENTIN) 875-125 MG tablet Take 1 tablet by mouth every 12 (twelve) hours. 02/17/22   Massie Maroon, FNP  ergocalciferol (DRISDOL) 1.25 MG (50000 UT) capsule Take 1 capsule (50,000 Units total) by mouth once a week. Patient not taking: Reported on 02/11/2022 04/12/20   Barbette Merino, NP  folic acid (FOLVITE) 1 MG tablet Take 1 tablet (1 mg total) by mouth daily. TAKE 1 TABLET BY MOUTH DAILY Strength 1 mg Patient not taking: Reported on 02/11/2022 10/22/21   Massie Maroon, FNP  ibuprofen (ADVIL) 800 MG tablet Take 1 tablet (800 mg total) by mouth every 6 (six) hours as needed for moderate pain. 03/31/22   Massie Maroon, FNP  Oxycodone HCl 10 MG TABS Take 1 tablet (10 mg total) by mouth every 4 (four) hours as needed (pain). 06/06/22   Massie Maroon, FNP      Allergies    Allergy medication [diphenhydramine]    Review of  Systems   Review of Systems  Constitutional:  Negative for chills and fever.  Respiratory:  Negative for shortness of breath.   Cardiovascular:  Negative for chest pain.  Gastrointestinal:  Negative for abdominal pain.  Musculoskeletal:  Positive for back pain.  Neurological:  Negative for headaches.    Physical Exam Updated Vital Signs BP (!) 141/78 (BP Location: Left Arm)   Pulse (!) 149   Temp 98.2 F (36.8 C) (Oral)   Resp (!) 22   Ht 5\' 5"  (1.651 m)   Wt 68 kg   SpO2 94%   BMI 24.96 kg/m  Physical Exam Vitals and nursing note reviewed.  Constitutional:      General: He is in acute distress.     Appearance: He is not ill-appearing.     Comments: Endorses significant mount of pain appears to be uncomfortable during my examination he is tachycardic.  HENT:     Head: Normocephalic and atraumatic.     Nose: No congestion.     Mouth/Throat:     Mouth: Mucous membranes are moist.     Pharynx: Oropharynx is clear.  Eyes:     Conjunctiva/sclera: Conjunctivae normal.  Cardiovascular:     Rate and Rhythm: Regular rhythm. Tachycardia present.     Pulses: Normal pulses.     Heart sounds: No murmur heard.    No friction rub. No gallop.  Pulmonary:  Effort: No respiratory distress.     Breath sounds: No wheezing, rhonchi or rales.  Abdominal:     Palpations: Abdomen is soft.     Tenderness: There is no abdominal tenderness. There is guarding.     Comments: Abdomen nondistended, soft, patient was guarding during my exam but was unable to elicit any pain, no rebound tenderness or peritoneal sign,  unclear if this was due to pain from his back.  Musculoskeletal:     Right lower leg: No edema.     Left lower leg: No edema.     Comments: Spine was palpated not focalized tenderness, in his lower lumbar region there is no crepitus deformities noted there is no overlying skin changes.  Patient is moving his lower extremities he has 5 out of 5 strength neurovascularly intact.   Skin:    General: Skin is warm and dry.  Neurological:     Mental Status: He is alert.  Psychiatric:        Mood and Affect: Mood normal.     ED Results / Procedures / Treatments   Labs (all labs ordered are listed, but only abnormal results are displayed) Labs Reviewed  COMPREHENSIVE METABOLIC PANEL - Abnormal; Notable for the following components:      Result Value   Glucose, Bld 138 (*)    Total Bilirubin 2.9 (*)    All other components within normal limits  CBC WITH DIFFERENTIAL/PLATELET - Abnormal; Notable for the following components:   WBC 22.1 (*)    RBC 3.13 (*)    Hemoglobin 9.3 (*)    HCT 26.8 (*)    RDW 21.7 (*)    nRBC 2.3 (*)    All other components within normal limits  RETICULOCYTES  LACTIC ACID, PLASMA  LACTIC ACID, PLASMA  URINALYSIS, ROUTINE W REFLEX MICROSCOPIC    EKG None  Radiology No results found.  Procedures Procedures    Medications Ordered in ED Medications  0.45 % sodium chloride infusion ( Intravenous New Bag/Given 06/11/22 0555)  HYDROmorphone (DILAUDID) injection 2 mg (has no administration in time range)  HYDROmorphone (DILAUDID) injection 2 mg (has no administration in time range)  ondansetron (ZOFRAN) injection 4 mg (has no administration in time range)  HYDROmorphone (DILAUDID) injection 2 mg (2 mg Intravenous Given 06/11/22 0556)    ED Course/ Medical Decision Making/ A&P                           Medical Decision Making Amount and/or Complexity of Data Reviewed Labs: ordered. Radiology: ordered.  Risk Prescription drug management.   This patient presents to the ED for concern of sickle cell pain crisis, this involves an extensive number of treatment options, and is a complaint that carries with it a high risk of complications and morbidity.  The differential diagnosis includes acute chest pain syndrome, septic arthritis, UTI, pyelo-,    Additional history obtained:  Additional history obtained from  N/A External records from outside source obtained and reviewed including previous discharge summary   Co morbidities that complicate the patient evaluation  Sickle cell disease, chronic pain syndrome  Social Determinants of Health:  N/A    Lab Tests:  I Ordered, and personally interpreted labs.  The pertinent results include: CBC shows leukocytosis of 22.1, normocytic anemia hemoglobin 9.3 CMP shows glucose of 1386 total T. bili 2.9   Imaging Studies ordered:  I ordered imaging studies including CT AP, CT lumbar spine I independently  visualized and interpreted imaging which showed N/A I agree with the radiologist interpretation   Cardiac Monitoring:  The patient was maintained on a cardiac monitor.  I personally viewed and interpreted the cardiac monitored which showed an underlying rhythm of: N/A   Medicines ordered and prescription drug management:  I ordered medication including Dilaudid, fluids, antiemetics I have reviewed the patients home medicines and have made adjustments as needed  Critical Interventions:  N/A   Reevaluation:  Presents with sickle cell pain crisis significantly and comfortable during my examination, will obtain basic lab work-up started on pain medications reassess  Patient has a leukocytosis of 22,000, CMP is unremarkable, I reassessed the patient heart rate has improved but still tach in the 120s to 130s, I reassessed his abdomen still is guarding on my exam but not endorsing any pain, pain is still around his back, will obtain CT abdomen pelvis for further evaluation for exclusion of possible intra-abdominal abnormality will also add a no charge of lumbar spine to rule out possible occult fracture.  Suspicion for sepsis is lower at this time as there is no obvious source of infection present my exam, he does have an elevated leukocytosis and is tachycardic but I suspect this is more an acute phase from sickle cell disease, will withhold  antibiotics until CT imaging is back.  Consultations Obtained:  N/A    Test Considered:  N/A    Rule out Low suspicion for acute chest pain syndrome not endorsing chest pain or shortness of breath denies any neurologic symptoms.  I have low suspicion for spine equina no red flag symptoms present no saddle paresthesias no leg weakness no urinary or bowel incontinency's.  Suspicion for epidural abscess also lower at this time there is no overlying skin changes, not endorsing history of IV drug use.     Dispostion and problem list  Due to shift change patient be handed to bowie PA-C  Follow-up on CT imaging, suspect patient will likely need admission for sickle cell pain crisis.           Final Clinical Impression(s) / ED Diagnoses Final diagnoses:  None    Rx / DC Orders ED Discharge Orders     None         Marcello Fennel, PA-C 06/11/22 0254    Palumbo, April, MD 06/11/22 [phone]

## 2022-06-11 NOTE — Plan of Care (Signed)
  Problem: Pain Managment: Goal: General experience of comfort will improve Outcome: Progressing   Problem: Activity: Goal: Risk for activity intolerance will decrease Outcome: Progressing   

## 2022-06-11 NOTE — Plan of Care (Signed)
  Problem: Education: Goal: Knowledge of vaso-occlusive preventative measures will improve Outcome: Progressing   

## 2022-06-11 NOTE — H&P (Signed)
H&P  Patient Demographics:  Jonathan [name], is a 21 y.o. male  MRN: [id number]   DOB - May 27, 2001  Admit Date - 06/11/2022  Outpatient Primary MD for the patient is Quentin Angst, MD  Chief Complaint  Patient presents with   Sickle Cell Pain Crisis      HPI:   Jonathan [name]  is a 21 y.o. male with a medical history significant for sickle cell disease, chronic pain syndrome, mild intermittent asthma, and history of anemia of chronic disease presents to the emergency department with allover body pain.  Patient states that he has been experiencing allover body pain since yesterday that has been increasing in intensity.  He says that pain is primarily to his joints and is consistent with previous sickle cell crisis.  He attributes this crisis to changes in weather.  Patient attempted home oxycodone without any sustained relief.  He rates his pain as 10/10 despite IV Dilaudid, IV Toradol, and IV fluids.  He denies any fever, chills, chest pain, or shortness of breath.  No urinary symptoms, nausea, vomiting, or diarrhea.  No sick contacts, recent travel, or known exposure to COVID-19.  ER course: Patient's vital signs remained stable in the ER.  Currently recorded as BP 133/85 (BP Location: Right Arm)   Pulse (!) 103   Temp 97.8 F (36.6 C) (Oral)   Resp 18   Ht 5\' 5"  (1.651 m)   Wt 68 kg   SpO2 93%   BMI 24.96 kg/m  Patient currently on 2 L supplemental oxygen and maintaining oxygen saturation above 90%.  He is slightly tachycardic 100s to 120s.  CBC is notable for WBCs 22.1, hemoglobin 9 and platelets 371,000.  Complete metabolic panel shows bilirubin elevated at 2.9, otherwise.  On ER admission, lactic acid was 2.6, improved to 0.7 following fluid resuscitation.  Urinalysis shows specific gravity greater than 1.046 and proteinuria.  Robust reticulocytosis, reticulocyte percentage 8.6 and absolute reticulocytes 275.  Chest x-ray shows no evidence of acute cardiopulmonary disease.  CT  of abdomen shows no acute findings. Patient's pain persists despite IV Dilaudid, IV fluids, oxycodone, and IV Toradol.  He will be admitted for further management of his sickle cell pain crisis.  Review of systems:  Review of Systems  Constitutional: Negative.   HENT: Negative.    Eyes: Negative.   Respiratory: Negative.  Negative for shortness of breath.   Cardiovascular:  Negative for chest pain.  Gastrointestinal:  Positive for abdominal pain. Negative for blood in stool, constipation, diarrhea, heartburn, nausea and vomiting.  Musculoskeletal:  Positive for back pain and joint pain.  Skin: Negative.   Neurological: Negative.  Negative for dizziness and headaches.  Endo/Heme/Allergies: Negative.   Psychiatric/Behavioral: Negative.      With Past History of the following :   Past Medical History:  Diagnosis Date   Acute chest syndrome(517.3) 08/26/2011   march 2013, Sept 2013   Asthma    Avascular necrosis of bone of left hip (HCC)    Sickle cell disease, type SS (HCC)       Past Surgical History:  Procedure Laterality Date   ADENOIDECTOMY     SPLENECTOMY     TONSILLECTOMY     TONSILLECTOMY AND ADENOIDECTOMY     TOTAL HIP ARTHROPLASTY Left 03/07/2016   UMBILICAL HERNIA REPAIR       Social History:   Social History   Tobacco Use   Smoking status: Never   Smokeless tobacco: Never  Substance Use Topics  Alcohol use: No    Alcohol/week: 0.0 standard drinks of alcohol     Lives - At home   Family History :   Family History  Problem Relation Age of Onset   Hypertension Mother    Hypertension Maternal Grandmother    Hypertension Maternal Grandfather    Asthma Paternal Grandfather    Diabetes Paternal Grandfather      Home Medications:   Prior to Admission medications   Medication Sig Start Date End Date Taking? Authorizing Provider  ergocalciferol (DRISDOL) 1.25 MG (50000 UT) capsule Take 1 capsule (50,000 Units total) by mouth once a week. Patient not  taking: Reported on 02/11/2022 04/12/20   Vevelyn Francois, NP  folic acid (FOLVITE) 1 MG tablet Take 1 tablet (1 mg total) by mouth daily. TAKE 1 TABLET BY MOUTH DAILY Strength 1 mg Patient not taking: Reported on 02/11/2022 10/22/21   Dorena Dew, FNP  ibuprofen (ADVIL) 800 MG tablet Take 1 tablet (800 mg total) by mouth every 6 (six) hours as needed for moderate pain. 03/31/22   Dorena Dew, FNP  Oxycodone HCl 10 MG TABS Take 1 tablet (10 mg total) by mouth every 4 (four) hours as needed (pain). 06/06/22   Dorena Dew, FNP     Allergies:   Allergies  Allergen Reactions   Allergy Medication [Diphenhydramine] Swelling and Other (See Comments)    Face swells     Physical Exam:   Vitals:   Vitals:   06/11/22 0740 06/11/22 0900  BP: (!) 152/102 130/86  Pulse: (!) 119 (!) 117  Resp: 17 20  Temp:    SpO2: 95% 90%    Physical Exam: Constitutional: Patient appears well-developed and well-nourished.  Patient writhing in pain. HENT: Normocephalic, atraumatic, External right and left ear normal. Oropharynx is clear and moist.  Eyes: Conjunctivae and EOM are normal. PERRLA, no scleral icterus. Neck: Normal ROM. Neck supple. No JVD. No tracheal deviation. No thyromegaly. CVS: RRR, S1/S2 +, no murmurs, no gallops, no carotid bruit.  Pulmonary: Effort and breath sounds normal, no stridor, rhonchi, wheezes, rales.  Abdominal: Soft. BS +, no distension, tenderness, rebound or guarding.  Musculoskeletal: Normal range of motion. No edema and no tenderness.  Lymphadenopathy: No lymphadenopathy noted, cervical, inguinal or axillary Neuro: Alert. Normal reflexes, muscle tone coordination. No cranial nerve deficit. Skin: Skin is warm and dry. No rash noted. Not diaphoretic. No erythema. No pallor. Psychiatric: Normal mood and affect. Behavior, judgment, thought content normal.   Data Review:   CBC Recent Labs  Lab 06/06/22 1235 06/11/22 0527  WBC 11.1* 22.1*  HGB 9.7* 9.3*   HCT 29.2* 26.8*  PLT 271 371  MCV 89.6 85.6  MCH 29.8 29.7  MCHC 33.2 34.7  RDW 18.1* 21.7*  LYMPHSABS 2.1 2.5  MONOABS 1.2* 2.1*  EOSABS 0.3 0.1  BASOSABS 0.1 0.2*   ------------------------------------------------------------------------------------------------------------------  Chemistries  Recent Labs  Lab 06/11/22 0527  NA 137  K 3.9  CL 106  CO2 23  GLUCOSE 138*  BUN 12  CREATININE 1.00  CALCIUM 9.3  AST 21  ALT 9  ALKPHOS 108  BILITOT 2.9*   ------------------------------------------------------------------------------------------------------------------ estimated creatinine clearance is 100.8 mL/min (by C-G formula based on SCr of 1 mg/dL). ------------------------------------------------------------------------------------------------------------------ No results for input(s): "TSH", "T4TOTAL", "T3FREE", "THYROIDAB" in the last 72 hours.  Invalid input(s): "FREET3"  Coagulation profile No results for input(s): "INR", "PROTIME" in the last 168 hours. ------------------------------------------------------------------------------------------------------------------- No results for input(s): "DDIMER" in the last 72 hours. -------------------------------------------------------------------------------------------------------------------  Cardiac Enzymes No results for input(s): "CKMB", "TROPONINI", "MYOGLOBIN" in the last 168 hours.  Invalid input(s): "CK" ------------------------------------------------------------------------------------------------------------------ No results found for: "BNP"  ---------------------------------------------------------------------------------------------------------------  Urinalysis    Component Value Date/Time   COLORURINE YELLOW 06/11/2022 [place] 06/11/2022 0824   LABSPEC >1.046 (H) 06/11/2022 0824   PHURINE 5.0 06/11/2022 0824   GLUCOSEU NEGATIVE 06/11/2022 0824   HGBUR NEGATIVE 06/11/2022  0824   BILIRUBINUR NEGATIVE 06/11/2022 0824   BILIRUBINUR negative 04/12/2020 1159   KETONESUR NEGATIVE 06/11/2022 0824   PROTEINUR 30 (A) 06/11/2022 0824   UROBILINOGEN 1.0 04/12/2020 1159   UROBILINOGEN 1.0 04/04/2015 1712   NITRITE NEGATIVE 06/11/2022 0824   LEUKOCYTESUR NEGATIVE 06/11/2022 0824    ----------------------------------------------------------------------------------------------------------------   Imaging Results:    CT Abdomen Pelvis W Contrast  Result Date: 06/11/2022 CLINICAL DATA:  Sickle cell pain in the lower back.  Leukocytosis. EXAM: CT ABDOMEN AND PELVIS WITH CONTRAST TECHNIQUE: Multidetector CT imaging of the abdomen and pelvis was performed using the standard protocol following bolus administration of intravenous contrast. RADIATION DOSE REDUCTION: This exam was performed according to the departmental dose-optimization program which includes automated exposure control, adjustment of the mA and/or kV according to patient size and/or use of iterative reconstruction technique. CONTRAST:  142mL OMNIPAQUE IOHEXOL 300 MG/ML  SOLN COMPARISON:  None Available. FINDINGS: Lower chest: Linear densities at the lung bases attributed to scarring. Hepatobiliary: No focal liver abnormality.No evidence of biliary obstruction or stone. Pancreas: Unremarkable. Spleen: History of splenectomy. Adrenals/Urinary Tract: Negative adrenals. No hydronephrosis or stone. Full urinary bladder, otherwise unremarkable. Stomach/Bowel: Presumed recent meal. No bowel obstruction or visible inflammation, including appendicitis . Vascular/Lymphatic: No acute vascular abnormality. No mass or adenopathy. Reproductive:No pathologic findings. Other: No ascites or pneumoperitoneum. 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 Unremarkable left hip arthroplasty. No acute osseous finding. IMPRESSION: 1. No acute finding. 2. Full  urinary bladder reaching the umbilicus. 3. 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 Electronically Signed   By: Jorje Guild M.D.   On: 06/11/2022 07:46   CT L-SPINE NO CHARGE  Result Date: 06/11/2022 CLINICAL DATA:  Sickle cell pain in the lower back for 2 days EXAM: CT Lumbar Spine with contrast TECHNIQUE: Technique: Multiplanar CT images of the lumbar spine were reconstructed from contemporary CT of the Abdomen and Pelvis. RADIATION DOSE REDUCTION: This exam was performed according to the departmental dose-optimization program which includes automated exposure control, adjustment of the mA and/or kV according to patient size and/or use of iterative reconstruction technique. CONTRAST:  None additional COMPARISON:  None Available. FINDINGS: Segmentation: 5 lumbar type vertebrae Alignment: Straightening of lumbar lordosis Vertebrae: Diffuse sclerosis and heterogeneity of the visualized skeleton, typical of sickle cell disease. Chronic diffuse endplate concavities without discrete fracture line or focal collapse. Paraspinal and other soft tissues: Reported separately Disc levels: No bony impingement IMPRESSION: Ordinary changes of sickle cell disease throughout the visible skeleton. No acute or focal finding. Electronically Signed   By: Jorje Guild M.D.   On: 06/11/2022 07:43   DG Chest Portable 1 View  Result Date: 06/11/2022 CLINICAL DATA:  21 year old male with history of leukocytosis. Sickle cell pain crisis. Severe low back pain. EXAM: PORTABLE CHEST 1 VIEW COMPARISON:  Chest x-ray 02/11/2022. FINDINGS: Lung volumes are low. No consolidative airspace disease. No pleural effusions. No pneumothorax. No pulmonary nodule or mass noted. Pulmonary vasculature and the cardiomediastinal silhouette are within normal limits. Diffuse sclerosis throughout the  visualized skeletal structures, related to known history of sickle cell disease. IMPRESSION: 1. Low lung volumes without  radiographic evidence of acute cardiopulmonary disease. Electronically Signed   By: Vinnie Langton M.D.   On: 06/11/2022 07:05     Assessment & Plan:  Principal Problem:   Sickle cell pain crisis ([place]) Active Problems:   Mild intermittent asthma without complication   Anemia of chronic disease   Chronic pain  Sickle cell disease with pain crisis: Admit patient.  Initiate IV Dilaudid PCA per weight-based settings.  0.45% saline at 100 mL/h. Toradol 15 mg IV every 6 hours Oxycodone 10 mg every 4 hours as needed Monitor vital signs very closely, reevaluate pain scale regularly, and supplemental oxygen as needed. Continue folic acid and hydroxyurea.  Of note, patient received Ad[place]veo on 06/09/2022.  Chronic pain syndrome: Continue home medications  Anemia of chronic disease: Patient's hemoglobin is stable and consistent with his baseline.  There is no clinical indication for blood transfusion at this time.  We will follow labs in AM.  If hemoglobin less than 7.0 g/dL, consider transfusing 1 unit PRBCs.  Leukocytosis: WBCs 22.1 K.  Chest x-ray negative for any acute cardiopulmonary process.  CT of abdomen and pelvis shows no acute abnormalities.  Urinalysis unremarkable.  No acute infection.  More than likely reactive, secondary to acute sickle cell crisis.  We will continue to monitor closely without antibiotics.  Follow labs in AM.  Mild intermittent asthma: Continue home medications as needed  DVT Prophylaxis: Subcut Lovenox   AM Labs Ordered, also please review Full Orders  Family Communication: Admission, patient's condition and plan of care including tests being ordered have been discussed with the patient who indicate understanding and agree with the plan and Code Status.  Code Status: Full Code  Consults called: None    Admission status: Inpatient    Time spent in minutes : 50 minutes  Charlotte Park, MSN, FNP-C [facility] [id number] [place], [place] [address] [phone]  06/11/2022 at 10:08 AM

## 2022-06-11 NOTE — ED Provider Notes (Signed)
21 year old male signed out to me by previous provider, please see his note for complete H&P.  Patient does have significant history of sickle cell disease who presents with complaints of sick cell related pain.  He endorsed pain to his lower back since yesterday.  He mention pain felt similar to prior crisis.  He tries taking his home medication without relief.  He does not endorse any significant fever chills nausea or vomiting.  Last marijuana use was 2 weeks ago.    On exam, patient appears uncomfortable, writhing in bed, he is diaphoretic, he is tachycardic.  He does have some tenderness along his lower back without any point tenderness and abdomen is soft nontender.  Lungs are clear.   Labs and imaging obtained in to be reviewed and interpreted by me and I agree with radiologist interpretation.  Patient does have an elevated lactic acid of 2.6.  He also has an elevated white count of 22.1 as well as vital signs shows tachycardia with heart rate in the 140s.  He is not hypoxic, he is not hypotensive, and he is afebrile.  The remainder of his electrolyte panels are reassuring.  His hemoglobin is 9.3 similar to baseline.  Abdominal pelvis CT scan obtained without any acute finding.  CT L-spine showed no acute finding as well.  8:04 AM After receiving multiple doses of opiate pain medication on reassessment patient still appears uncomfortable.  Plan to consult for admission.  8:49 AM Appreciate consultation from nurse petitioner, [place], who agrees to admit patient for further management of his condition.  On repeat, lactic acid has normalized.  At this time I have not identified any obvious source of infection causing his symptoms.  I anticipate his pain is likely related to his sickle cell pain.  We will continue with pain management in the meantime.  An EKG obtained independently viewed by me and shows sinus tachycardia without concerning arrhythmia or ischemic changes.  We will continue with IV  fluid  On reassessment patient appears more comfortable.  He is aware and agree with admission.  .Critical Care  Performed by: Fayrene Helper, PA-C Authorized by: Fayrene Helper, PA-C   Critical care provider statement:    Critical care time (minutes):  30   Critical care was time spent personally by me on the following activities:  Development of treatment plan with patient or surrogate, discussions with consultants, evaluation of patient's response to treatment, examination of patient, ordering and review of laboratory studies, ordering and review of radiographic studies, ordering and performing treatments and interventions, pulse oximetry, re-evaluation of patient's condition and review of old charts   BP (!) 152/102   Pulse (!) 119   Temp 98.9 F (37.2 C) (Oral)   Resp 17   Ht 5\' 5"  (1.651 m)   Wt 68 kg   SpO2 95%   BMI 24.96 kg/m   Results for orders placed or performed during the hospital encounter of 06/11/22  Comprehensive metabolic panel  Result Value Ref Range   Sodium 137 135 - 145 mmol/L   Potassium 3.9 3.5 - 5.1 mmol/L   Chloride 106 98 - 111 mmol/L   CO2 23 22 - 32 mmol/L   Glucose, Bld 138 (H) 70 - 99 mg/dL   BUN 12 6 - 20 mg/dL   Creatinine, Ser 06/13/22 0.61 - 1.24 mg/dL   Calcium 9.3 8.9 - 7.82 mg/dL   Total Protein 8.1 6.5 - 8.1 g/dL   Albumin 4.5 3.5 - 5.0 g/dL  AST 21 15 - 41 U/L   ALT 9 0 - 44 U/L   Alkaline Phosphatase 108 38 - 126 U/L   Total Bilirubin 2.9 (H) 0.3 - 1.2 mg/dL   GFR, Estimated >60 >60 mL/min   Anion gap 8 5 - 15  CBC with Differential  Result Value Ref Range   WBC 22.1 (H) 4.0 - 10.5 K/uL   RBC 3.13 (L) 4.22 - 5.81 MIL/uL   Hemoglobin 9.3 (L) 13.0 - 17.0 g/dL   HCT 26.8 (L) 39.0 - 52.0 %   MCV 85.6 80.0 - 100.0 fL   MCH 29.7 26.0 - 34.0 pg   MCHC 34.7 30.0 - 36.0 g/dL   RDW 21.7 (H) 11.5 - 15.5 %   Platelets 371 150 - 400 K/uL   nRBC 2.3 (H) 0.0 - 0.2 %   Neutrophils Relative % 76 %   Neutro Abs 16.9 (H) 1.7 - 7.7 K/uL    Lymphocytes Relative 11 %   Lymphs Abs 2.5 0.7 - 4.0 K/uL   Monocytes Relative 10 %   Monocytes Absolute 2.1 (H) 0.1 - 1.0 K/uL   Eosinophils Relative 1 %   Eosinophils Absolute 0.1 0.0 - 0.5 K/uL   Basophils Relative 1 %   Basophils Absolute 0.2 (H) 0.0 - 0.1 K/uL   Immature Granulocytes 1 %   Abs Immature Granulocytes 0.30 (H) 0.00 - 0.07 K/uL   Polychromasia PRESENT    Sickle Cells PRESENT    Target Cells PRESENT   Reticulocytes  Result Value Ref Range   Retic Ct Pct 8.6 (H) 0.4 - 3.1 %   RBC. 3.13 (L) 4.22 - 5.81 MIL/uL   Retic Count, Absolute 275.0 (H) 19.0 - 186.0 K/uL   Immature Retic Fract 38.2 (H) 2.3 - 15.9 %  Lactic acid, plasma  Result Value Ref Range   Lactic Acid, Venous 2.6 (HH) 0.5 - 1.9 mmol/L  Lactic acid, plasma  Result Value Ref Range   Lactic Acid, Venous 0.7 0.5 - 1.9 mmol/L  Urinalysis, Routine w reflex microscopic Urine, Clean Catch  Result Value Ref Range   Color, Urine YELLOW YELLOW   APPearance CLEAR CLEAR   Specific Gravity, Urine >1.046 (H) 1.005 - 1.030   pH 5.0 5.0 - 8.0   Glucose, UA NEGATIVE NEGATIVE mg/dL   Hgb urine dipstick NEGATIVE NEGATIVE   Bilirubin Urine NEGATIVE NEGATIVE   Ketones, ur NEGATIVE NEGATIVE mg/dL   Protein, ur 30 (A) NEGATIVE mg/dL   Nitrite NEGATIVE NEGATIVE   Leukocytes,Ua NEGATIVE NEGATIVE   RBC / HPF 0-5 0 - 5 RBC/hpf   WBC, UA 0-5 0 - 5 WBC/hpf   Bacteria, UA NONE SEEN NONE SEEN   Squamous Epithelial / LPF 0-5 0 - 5   CT Abdomen Pelvis W Contrast  Result Date: 06/11/2022 CLINICAL DATA:  Sickle cell pain in the lower back.  Leukocytosis. EXAM: CT ABDOMEN AND PELVIS WITH CONTRAST TECHNIQUE: Multidetector CT imaging of the abdomen and pelvis was performed using the standard protocol following bolus administration of intravenous contrast. RADIATION DOSE REDUCTION: This exam was performed according to the departmental dose-optimization program which includes automated exposure control, adjustment of the mA and/or kV  according to patient size and/or use of iterative reconstruction technique. CONTRAST:  190mL OMNIPAQUE IOHEXOL 300 MG/ML  SOLN COMPARISON:  None Available. FINDINGS: Lower chest: Linear densities at the lung bases attributed to scarring. Hepatobiliary: No focal liver abnormality.No evidence of biliary obstruction or stone. Pancreas: Unremarkable. Spleen: History of splenectomy. Adrenals/Urinary Tract: Negative  adrenals. No hydronephrosis or stone. Full urinary bladder, otherwise unremarkable. Stomach/Bowel: Presumed recent meal. No bowel obstruction or visible inflammation, including appendicitis . Vascular/Lymphatic: No acute vascular abnormality. No mass or adenopathy. Reproductive:No pathologic findings. Other: No ascites or pneumoperitoneum. 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 Unremarkable left hip arthroplasty. No acute osseous finding. IMPRESSION: 1. No acute finding. 2. Full urinary bladder reaching the umbilicus. 3. 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 Electronically Signed   By: Tiburcio Pea M.D.   On: 06/11/2022 07:46   CT L-SPINE NO CHARGE  Result Date: 06/11/2022 CLINICAL DATA:  Sickle cell pain in the lower back for 2 days EXAM: CT Lumbar Spine with contrast TECHNIQUE: Technique: Multiplanar CT images of the lumbar spine were reconstructed from contemporary CT of the Abdomen and Pelvis. RADIATION DOSE REDUCTION: This exam was performed according to the departmental dose-optimization program which includes automated exposure control, adjustment of the mA and/or kV according to patient size and/or use of iterative reconstruction technique. CONTRAST:  None additional COMPARISON:  None Available. FINDINGS: Segmentation: 5 lumbar type vertebrae Alignment: Straightening of lumbar lordosis Vertebrae: Diffuse sclerosis and heterogeneity of the visualized skeleton, typical of sickle cell  disease. Chronic diffuse endplate concavities without discrete fracture line or focal collapse. Paraspinal and other soft tissues: Reported separately Disc levels: No bony impingement IMPRESSION: Ordinary changes of sickle cell disease throughout the visible skeleton. No acute or focal finding. Electronically Signed   By: Tiburcio Pea M.D.   On: 06/11/2022 07:43   DG Chest Portable 1 View  Result Date: 06/11/2022 CLINICAL DATA:  21 year old male with history of leukocytosis. Sickle cell pain crisis. Severe low back pain. EXAM: PORTABLE CHEST 1 VIEW COMPARISON:  Chest x-ray 02/11/2022. FINDINGS: Lung volumes are low. No consolidative airspace disease. No pleural effusions. No pneumothorax. No pulmonary nodule or mass noted. Pulmonary vasculature and the cardiomediastinal silhouette are within normal limits. Diffuse sclerosis throughout the visualized skeletal structures, related to known history of sickle cell disease. IMPRESSION: 1. Low lung volumes without radiographic evidence of acute cardiopulmonary disease. Electronically Signed   By: Trudie Reed M.D.   On: 06/11/2022 07:05       Fayrene Helper, PA-C 06/11/22 7893    Glynn Octave, MD 06/11/22 1606

## 2022-06-11 NOTE — ED Triage Notes (Signed)
Pt states that having sickle cell pain in his lower back that started yesterday

## 2022-06-12 DIAGNOSIS — D5700 Hb-SS disease with crisis, unspecified: Secondary | ICD-10-CM | POA: Diagnosis not present

## 2022-06-12 LAB — CBC
HCT: 24.6 % — ABNORMAL LOW (ref 39.0–52.0)
Hemoglobin: 8.3 g/dL — ABNORMAL LOW (ref 13.0–17.0)
MCH: 29.6 pg (ref 26.0–34.0)
MCHC: 33.7 g/dL (ref 30.0–36.0)
MCV: 87.9 fL (ref 80.0–100.0)
Platelets: 349 10*3/uL (ref 150–400)
RBC: 2.8 MIL/uL — ABNORMAL LOW (ref 4.22–5.81)
RDW: 22.5 % — ABNORMAL HIGH (ref 11.5–15.5)
WBC: 18.6 10*3/uL — ABNORMAL HIGH (ref 4.0–10.5)
nRBC: 5.4 % — ABNORMAL HIGH (ref 0.0–0.2)

## 2022-06-12 LAB — BASIC METABOLIC PANEL
Anion gap: 7 (ref 5–15)
BUN: 11 mg/dL (ref 6–20)
CO2: 27 mmol/L (ref 22–32)
Calcium: 8.9 mg/dL (ref 8.9–10.3)
Chloride: 102 mmol/L (ref 98–111)
Creatinine, Ser: 0.71 mg/dL (ref 0.61–1.24)
GFR, Estimated: >60 mL/min (ref >60)
Glucose, Bld: 100 mg/dL — ABNORMAL HIGH (ref 70–99)
Potassium: 4.2 mmol/L (ref 3.5–5.1)
Sodium: 136 mmol/L (ref 135–145)

## 2022-06-12 MED ORDER — HYDROMORPHONE 1 MG/ML IV SOLN
INTRAVENOUS | Status: DC
  Administered 2022-06-12: 5 mg via INTRAVENOUS
  Administered 2022-06-12: 3.5 mg via INTRAVENOUS
  Administered 2022-06-12: 5 mg via INTRAVENOUS
  Administered 2022-06-13: 4.5 mg via INTRAVENOUS
  Administered 2022-06-13: 3.5 mg via INTRAVENOUS
  Administered 2022-06-13: 2 mg via INTRAVENOUS
  Administered 2022-06-13: 4 mg via INTRAVENOUS
  Administered 2022-06-13: 3.5 mg via INTRAVENOUS
  Administered 2022-06-13: 1.5 mg via INTRAVENOUS
  Administered 2022-06-14: 3 mg via INTRAVENOUS
  Administered 2022-06-14: 0.5 mg via INTRAVENOUS
  Administered 2022-06-14: 0.1 mg via INTRAVENOUS
  Administered 2022-06-14: 1 mg via INTRAVENOUS
  Filled 2022-06-12 (×2): qty 30

## 2022-06-12 MED ORDER — INFLUENZA VAC SPLIT QUAD 0.5 ML IM SUSY: 0.5000 mL | PREFILLED_SYRINGE | INTRAMUSCULAR | Status: DC

## 2022-06-12 MED ORDER — METHOCARBAMOL 500 MG PO TABS
500.0000 mg | ORAL_TABLET | Freq: Three times a day (TID) | ORAL | Status: DC | PRN
  Administered 2022-06-12 – 2022-06-14 (×5): 500 mg via ORAL
  Filled 2022-06-12 (×5): qty 1

## 2022-06-12 MED ORDER — METHOCARBAMOL 500 MG PO TABS
500.0000 mg | ORAL_TABLET | Freq: Once | ORAL | Status: AC
  Administered 2022-06-12: 500 mg via ORAL
  Filled 2022-06-12: qty 1

## 2022-06-12 NOTE — Progress Notes (Signed)
Subjective: Jonathan [name] is a medical history significant for sickle cell disease, chronic pain syndrome, opiate dependence and tolerance, and mild intermittent asthma was admitted for sickle cell pain crisis. Patient continues to have pain primarily to low back and lower extremities.  He states that pain intensity improved slightly after receiving Robaxin on last night.  Patient rates his pain at 8/10. He denies headache, chest pain, shortness of breath, urinary symptoms, nausea, vomiting, or diarrhea.  Objective:  Vital signs in last 24 hours:  Vitals:   06/12/22 1041 06/12/22 1126 06/12/22 1449 06/12/22 1500  BP: (!) 156/88  (!) 152/88   Pulse: (!) 116  (!) 106   Resp: 20 18 20 16   Temp: 99.7 F (37.6 C)  99.8 F (37.7 C)   TempSrc: Oral  Oral   SpO2: 94% 98% 97% 98%  Weight:      Height:        Intake/Output from previous day:   Intake/Output Summary (Last 24 hours) at 06/12/2022 1805 Last data filed at 06/12/2022 1600 Gross per 24 hour  Intake 2548.07 ml  Output 3000 ml  Net -451.93 ml    Physical Exam: General: Alert, awake, oriented x3, in no acute distress.  HEENT: [place]/AT PEERL, EOMI Neck: Trachea midline,  no masses, no thyromegal,y no JVD, no carotid bruit OROPHARYNX:  Moist, No exudate/ erythema/lesions.  Heart: Regular rate and rhythm, without murmurs, rubs, gallops, PMI non-displaced, no heaves or thrills on palpation.  Lungs: Clear to auscultation, no wheezing or rhonchi noted. No increased vocal fremitus resonant to percussion  Abdomen: Soft, nontender, nondistended, positive bowel sounds, no masses no hepatosplenomegaly noted..  Neuro: No focal neurological deficits noted cranial nerves II through XII grossly intact. DTRs 2+ bilaterally upper and lower extremities. Strength 5 out of 5 in bilateral upper and lower extremities. Musculoskeletal: No warm swelling or erythema around joints, no spinal tenderness noted. Psychiatric: Patient alert and oriented x3,  good insight and cognition, good recent to remote recall. Lymph node survey: No cervical axillary or inguinal lymphadenopathy noted.  Lab Results:  Basic Metabolic Panel:    Component Value Date/Time   NA 136 06/12/2022 0547   NA 139 08/06/2021 1030   K 4.2 06/12/2022 0547   CL 102 06/12/2022 0547   CO2 27 06/12/2022 0547   BUN 11 06/12/2022 0547   BUN 9 08/06/2021 1030   CREATININE 0.71 06/12/2022 0547   CREATININE 0.70 05/12/2016 1052   GLUCOSE 100 (H) 06/12/2022 0547   CALCIUM 8.9 06/12/2022 0547   CBC:    Component Value Date/Time   WBC 18.6 (H) 06/12/2022 0547   HGB 8.3 (L) 06/12/2022 0547   HGB 7.6 (L) 08/06/2021 1030   HCT 24.6 (L) 06/12/2022 0547   HCT 23.6 (L) 08/06/2021 1030   PLT 349 06/12/2022 0547   PLT 958 (HH) 08/06/2021 1030   MCV 87.9 06/12/2022 0547   MCV 96 08/06/2021 1030   NEUTROABS 16.9 (H) 06/11/2022 0527   NEUTROABS 3.4 08/06/2021 1030   LYMPHSABS 2.5 06/11/2022 0527   LYMPHSABS 2.4 08/06/2021 1030   MONOABS 2.1 (H) 06/11/2022 0527   EOSABS 0.1 06/11/2022 0527   EOSABS 0.2 08/06/2021 1030   BASOSABS 0.2 (H) 06/11/2022 0527   BASOSABS 0.1 08/06/2021 1030    No results found for this or any previous visit (from the past 240 hour(s)).  Studies/Results: CT Abdomen Pelvis W Contrast  Result Date: 06/11/2022 CLINICAL DATA:  Sickle cell pain in the lower back.  Leukocytosis. EXAM: CT ABDOMEN  AND PELVIS WITH CONTRAST TECHNIQUE: Multidetector CT imaging of the abdomen and pelvis was performed using the standard protocol following bolus administration of intravenous contrast. RADIATION DOSE REDUCTION: This exam was performed according to the departmental dose-optimization program which includes automated exposure control, adjustment of the mA and/or kV according to patient size and/or use of iterative reconstruction technique. CONTRAST:  198mL OMNIPAQUE IOHEXOL 300 MG/ML  SOLN COMPARISON:  None Available. FINDINGS: Lower chest: Linear densities at the  lung bases attributed to scarring. Hepatobiliary: No focal liver abnormality.No evidence of biliary obstruction or stone. Pancreas: Unremarkable. Spleen: History of splenectomy. Adrenals/Urinary Tract: Negative adrenals. No hydronephrosis or stone. Full urinary bladder, otherwise unremarkable. Stomach/Bowel: Presumed recent meal. No bowel obstruction or visible inflammation, including appendicitis . Vascular/Lymphatic: No acute vascular abnormality. No mass or adenopathy. Reproductive:No pathologic findings. Other: No ascites or pneumoperitoneum. 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 Unremarkable left hip arthroplasty. No acute osseous finding. IMPRESSION: 1. No acute finding. 2. Full urinary bladder reaching the umbilicus. 3. 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 Electronically Signed   By: Jorje Guild M.D.   On: 06/11/2022 07:46   CT L-SPINE NO CHARGE  Result Date: 06/11/2022 CLINICAL DATA:  Sickle cell pain in the lower back for 2 days EXAM: CT Lumbar Spine with contrast TECHNIQUE: Technique: Multiplanar CT images of the lumbar spine were reconstructed from contemporary CT of the Abdomen and Pelvis. RADIATION DOSE REDUCTION: This exam was performed according to the departmental dose-optimization program which includes automated exposure control, adjustment of the mA and/or kV according to patient size and/or use of iterative reconstruction technique. CONTRAST:  None additional COMPARISON:  None Available. FINDINGS: Segmentation: 5 lumbar type vertebrae Alignment: Straightening of lumbar lordosis Vertebrae: Diffuse sclerosis and heterogeneity of the visualized skeleton, typical of sickle cell disease. Chronic diffuse endplate concavities without discrete fracture line or focal collapse. Paraspinal and other soft tissues: Reported separately Disc levels: No bony impingement IMPRESSION: Ordinary  changes of sickle cell disease throughout the visible skeleton. No acute or focal finding. Electronically Signed   By: Jorje Guild M.D.   On: 06/11/2022 07:43   DG Chest Portable 1 View  Result Date: 06/11/2022 CLINICAL DATA:  21 year old male with history of leukocytosis. Sickle cell pain crisis. Severe low back pain. EXAM: PORTABLE CHEST 1 VIEW COMPARISON:  Chest x-ray 02/11/2022. FINDINGS: Lung volumes are low. No consolidative airspace disease. No pleural effusions. No pneumothorax. No pulmonary nodule or mass noted. Pulmonary vasculature and the cardiomediastinal silhouette are within normal limits. Diffuse sclerosis throughout the visualized skeletal structures, related to known history of sickle cell disease. IMPRESSION: 1. Low lung volumes without radiographic evidence of acute cardiopulmonary disease. Electronically Signed   By: Vinnie Langton M.D.   On: 06/11/2022 07:05    Medications: Scheduled Meds:  enoxaparin (LOVENOX) injection  40 mg Subcutaneous QHS   folic acid  1 mg Oral Daily   HYDROmorphone   Intravenous Q4H   [START ON 06/13/2022] influenza vac split quadrivalent PF  0.5 mL Intramuscular Tomorrow-1000   ketorolac  15 mg Intravenous Q6H   senna-docusate  1 tablet Oral BID   Continuous Infusions:  sodium chloride 50 mL/hr at 06/12/22 0329   PRN Meds:.methocarbamol, naloxone **AND** sodium chloride flush, ondansetron, oxyCODONE, polyethylene glycol  Consultants: none  Procedures: none  Antibiotics: none  Assessment/Plan: Principal Problem:   Sickle cell pain crisis ([place]) Active Problems:   Mild intermittent asthma without complication  Anemia of chronic disease   Chronic pain  Sickle cell disease with pain crisis: Continue IV Dilaudid PCA, no changes in settings Decrease IV fluids to [facility] Toradol 15 mg IV every 6 hours Oxycodone 10 mg every 4 hours as needed Monitor vital signs very closely, reevaluate pain scale regularly, and supplemental oxygen  as needed  Chronic pain syndrome: Continue home medications  Anemia chronic disease: Hemoglobin is stable and consistent with patient's baseline.  Blood transfusion warranted at this time.  Continue to follow closely.  Labs in AM.  Leukocytosis: Improving.  Patient afebrile.  No signs of acute infection.  Continue to follow closely.  Mild intermittent asthma:  Continue home medications as needed   Code Status: Full Code Family Communication: N/A Disposition Plan: Not yet ready for discharge  [place], MSN, FNP-C Patient [place], [place] [address] [phone]  If 7PM-7AM, please contact night-coverage.  06/12/2022, 6:05 PM  LOS: 1 day

## 2022-06-13 DIAGNOSIS — D5700 Hb-SS disease with crisis, unspecified: Secondary | ICD-10-CM | POA: Diagnosis not present

## 2022-06-13 LAB — BASIC METABOLIC PANEL
Anion gap: 8 (ref 5–15)
BUN: 9 mg/dL (ref 6–20)
CO2: 27 mmol/L (ref 22–32)
Calcium: 9.2 mg/dL (ref 8.9–10.3)
Chloride: 102 mmol/L (ref 98–111)
Creatinine, Ser: 0.71 mg/dL (ref 0.61–1.24)
GFR, Estimated: >60 mL/min (ref >60)
Glucose, Bld: 98 mg/dL (ref 70–99)
Potassium: 3.9 mmol/L (ref 3.5–5.1)
Sodium: 137 mmol/L (ref 135–145)

## 2022-06-13 LAB — CBC
HCT: 24.1 % — ABNORMAL LOW (ref 39.0–52.0)
Hemoglobin: 8.1 g/dL — ABNORMAL LOW (ref 13.0–17.0)
MCH: 29.6 pg (ref 26.0–34.0)
MCHC: 33.6 g/dL (ref 30.0–36.0)
MCV: 88 fL (ref 80.0–100.0)
Platelets: 362 10*3/uL (ref 150–400)
RBC: 2.74 MIL/uL — ABNORMAL LOW (ref 4.22–5.81)
RDW: 21.5 % — ABNORMAL HIGH (ref 11.5–15.5)
WBC: 15.4 10*3/uL — ABNORMAL HIGH (ref 4.0–10.5)
nRBC: 5.1 % — ABNORMAL HIGH (ref 0.0–0.2)

## 2022-06-13 NOTE — Progress Notes (Signed)
Subjective: Jonathan [name] is a medical history significant for sickle cell disease, chronic pain syndrome, opiate dependence and tolerance, and mild intermittent asthma was admitted for sickle cell pain crisis. Patient continues to have pain primarily to low back and lower extremities.  He states that pain intensity improved slightly after receiving Robaxin on last night.  Patient rates his pain at 4/10. Patient is requesting an additional day.  He denies headache, chest pain, shortness of breath, urinary symptoms, nausea, vomiting, or diarrhea.  Objective:  Vital signs in last 24 hours:  Vitals:   06/13/22 0203 06/13/22 0331 06/13/22 0615 06/13/22 0749  BP: 122/72  129/65   Pulse: 98  (!) 107   Resp: 18 20 18    Temp: 99.7 F (37.6 C)  99.6 F (37.6 C)   TempSrc: Oral  Oral   SpO2: 96% 94% 95% 97%  Weight:      Height:        Intake/Output from previous day:   Intake/Output Summary (Last 24 hours) at 06/13/2022 0937 Last data filed at 06/13/2022 0537 Gross per 24 hour  Intake 1153 ml  Output 1450 ml  Net -297 ml    Physical Exam: General: Alert, awake, oriented x3, in no acute distress.  HEENT: [place]/AT [place]ER[place], EOMI Neck: Trachea midline,  no masses, no thyromegal,y no JVD, no carotid bruit OROPHARYNX:  Moist, No exudate/ erythema/lesions.  Heart: Regular rate and rhythm, without murmurs, rubs, gallops, PMI non-displaced, no heaves or thrills on palpation.  Lungs: Clear to auscultation, no wheezing or rhonchi noted. No increased vocal fremitus resonant to percussion  Abdomen: Soft, nontender, nondistended, positive bowel sounds, no masses no hepatosplenomegaly noted..  Neuro: No focal neurological deficits noted cranial nerves II through XII grossly intact. DTRs 2+ bilaterally upper and lower extremities. Strength 5 out of 5 in bilateral upper and lower extremities. Musculoskeletal: No warm swelling or erythema around joints, no spinal tenderness noted. Psychiatric: Patient  alert and oriented x3, good insight and cognition, good recent to remote recall. Lymph node survey: No cervical axillary or inguinal lymphadenopathy noted.  Lab Results:  Basic Metabolic Panel:    Component Value Date/Time   NA 137 06/13/2022 0557   NA 139 08/06/2021 1030   K 3.9 06/13/2022 0557   CL 102 06/13/2022 0557   CO2 27 06/13/2022 0557   BUN 9 06/13/2022 0557   BUN 9 08/06/2021 1030   CREATININE 0.71 06/13/2022 0557   CREATININE 0.70 05/12/2016 1052   GLUCOSE 98 06/13/2022 0557   CALCIUM 9.2 06/13/2022 0557   CBC:    Component Value Date/Time   WBC 15.4 (H) 06/13/2022 0557   HGB 8.1 (L) 06/13/2022 0557   HGB 7.6 (L) 08/06/2021 1030   HCT 24.1 (L) 06/13/2022 0557   HCT 23.6 (L) 08/06/2021 1030   PLT 362 06/13/2022 0557   PLT 958 (HH) 08/06/2021 1030   MCV 88.0 06/13/2022 0557   MCV 96 08/06/2021 1030   NEUTROABS 16.9 (H) 06/11/2022 0527   NEUTROABS 3.4 08/06/2021 1030   LYMPHSABS 2.5 06/11/2022 0527   LYMPHSABS 2.4 08/06/2021 1030   MONOABS 2.1 (H) 06/11/2022 0527   EOSABS 0.1 06/11/2022 0527   EOSABS 0.2 08/06/2021 1030   BASOSABS 0.2 (H) 06/11/2022 0527   BASOSABS 0.1 08/06/2021 1030    No results found for this or any previous visit (from the past 240 hour(s)).  Studies/Results: No results found.  Medications: Scheduled Meds:  enoxaparin (LOVENOX) injection  40 mg Subcutaneous QHS   folic acid  1 mg Oral Daily   HYDROmorphone   Intravenous Q4H   influenza vac split quadrivalent PF  0.5 mL Intramuscular Tomorrow-1000   ketorolac  15 mg Intravenous Q6H   senna-docusate  1 tablet Oral BID   Continuous Infusions:  sodium chloride 50 mL/hr at 06/12/22 0329   PRN Meds:.methocarbamol, naloxone **AND** sodium chloride flush, ondansetron, oxyCODONE, polyethylene glycol  Consultants: none  Procedures: none  Antibiotics: none  Assessment/Plan: Principal Problem:   Sickle cell pain crisis ([place]) Active Problems:   Mild intermittent asthma  without complication   Anemia of chronic disease   Chronic pain  Sickle cell disease with pain crisis: Continue IV Dilaudid PCA, no changes in settings Decrease IV fluids to KVO Toradol 15 mg IV every 6 hours Oxycodone 10 mg every 4 hours as needed Monitor vital signs very closely, reevaluate pain scale regularly, and supplemental oxygen as needed  Chronic pain syndrome: Continue home medications  Anemia chronic disease: Hemoglobin is stable and consistent with patient's baseline.  Blood transfusion warranted at this time.  Continue to follow closely.  Labs in AM.  Leukocytosis: Improving.  Patient afebrile.  No signs of acute infection.  Continue to follow closely.  Mild intermittent asthma:  Continue home medications as needed   Code Status: Full Code Family Communication: N/A Disposition Plan: Not yet ready for discharge  [place], MSN, FNP-C Patient [place], [place] [address] [phone]  If 7PM-7AM, please contact night-coverage.  06/13/2022, 9:37 AM  LOS: 2 days

## 2022-06-13 NOTE — TOC Progression Note (Signed)
Transition of Care [facility]) - Progression Note    Patient Details  Name: Jonathan [name] MRN: [id number] Date of Birth: 18-Feb-2001  Transition of Care [facility]) CM/SW [place], RN Phone Number:[phone]  06/13/2022, 10:46 AM  Clinical Narrative:     Transition of Care (TOC) Screening Note   Patient Details  Name: Jonathan [name] Date of Birth: [birth year]   Transition of Care [facility]) CM/SW Contact:    Angelita Ingles, RN Phone Number: 06/13/2022, 10:47 AM    Transition of Care Department (TOC) has reviewed patient and no TOC needs have been identified at this time. We will continue to monitor patient advancement through interdisciplinary progression rounds. If new patient transition needs arise, please place a TOC consult.          Expected Discharge Plan and Services                                                 Social Determinants of Health (SDOH) Interventions    Readmission Risk Interventions    12/07/2021    5:19 PM 10/15/2021   10:30 AM  Readmission Risk Prevention Plan  Transportation Screening Complete Complete  PCP or Specialist Appt within 5-7 Days Complete Complete  Home Care Screening Complete Complete  Medication Review (RN CM)  Complete

## 2022-06-14 DIAGNOSIS — D5700 Hb-SS disease with crisis, unspecified: Secondary | ICD-10-CM | POA: Diagnosis not present

## 2022-06-14 MED ORDER — METHOCARBAMOL 500 MG PO TABS: 500.0000 mg | ORAL_TABLET | Freq: Three times a day (TID) | ORAL | 0 refills | Status: DC | PRN

## 2022-06-14 MED ORDER — OXYCODONE HCL 10 MG PO TABS: 10.0000 mg | ORAL_TABLET | ORAL | 0 refills | Status: DC | PRN

## 2022-06-14 MED ORDER — OXYCODONE HCL 5 MG PO TABS: 10.0000 mg | ORAL_TABLET | Freq: Once | ORAL | Status: DC

## 2022-06-14 MED ORDER — IBUPROFEN 200 MG PO TABS: 800.0000 mg | ORAL_TABLET | Freq: Four times a day (QID) | ORAL | 0 refills | Status: DC | PRN

## 2022-06-14 NOTE — Hospital Course (Signed)
Patient was admitted with sickle cell pain crisis.  Was treated with IV Dilaudid PCA, some Toradol and IV fluids.  Pain was controlled.  Currently not complain.  Patient has been transitioned to his oral home regimen of oxycodone and ibuprofen.  He has intermittent asthma which is now under control.  Patient also has been hydrated.

## 2022-06-14 NOTE — Discharge Summary (Signed)
Physician Discharge Summary   Patient: Jonathan [name] MRN: [id number] DOB: 18-May-2001  Admit date:     06/11/2022  Discharge date: 06/14/22  Discharge Physician: Barbette Merino   PCP: Tresa Garter, MD   Recommendations at discharge:   Follow-up with your PCP.  Continue home regimen.  Discharge Diagnoses: Principal Problem:   Sickle cell pain crisis ([place]) Active Problems:   Mild intermittent asthma without complication   Anemia of chronic disease   Chronic pain  Resolved Problems:   * No resolved hospital problems. [facility] Course: Patient was admitted with sickle cell pain crisis.  Was treated with IV Dilaudid PCA, some Toradol and IV fluids.  Pain was controlled.  Currently not complain.  Patient has been transitioned to his oral home regimen of oxycodone and ibuprofen.  He has intermittent asthma which is now under control.  Patient also has been hydrated.  Assessment and Plan: No notes have been filed under this hospital service. Service: Hospitalist      Pain control - [employer] Controlled Substance Reporting System database was reviewed. and patient was instructed, not to drive, operate heavy machinery, perform activities at heights, swimming or participation in water activities or provide baby-sitting services while on Pain, Sleep and Anxiety Medications; until their outpatient Physician has advised to do so again. Also recommended to not to take more than prescribed Pain, Sleep and Anxiety Medications.  Consultants: None Procedures performed: None Disposition: Home Diet recommendation:  Discharge Diet Orders (From admission, onward)     Start     Ordered   06/14/22 0000  Diet - low sodium heart healthy        06/14/22 1108           Regular diet DISCHARGE MEDICATION: Allergies as of 06/14/2022       Reactions   Allergy Medication [diphenhydramine] Swelling, Other (See Comments)   Face swells.         Medication List     STOP taking  these medications    ergocalciferol 1.25 MG (50000 UT) capsule Commonly known as: Drisdol   folic acid 1 MG tablet Commonly known as: FOLVITE       TAKE these medications    ibuprofen 200 MG tablet Commonly known as: ADVIL Take 800 mg by mouth every 6 (six) hours as needed (pain). What changed: Another medication with the same name was removed. Continue taking this medication, and follow the directions you see here.   Oxycodone HCl 10 MG Tabs Take 1 tablet (10 mg total) by mouth every 4 (four) hours as needed (pain).        Follow-up Information     Dorena Dew, FNP Follow up.   Specialty: Family Medicine Contact information: [place]. [place] [address] [phone]                Discharge Exam: Danley Danker Weights   06/11/22 0520  Weight: 68 kg   Constitutional: NAD, calm, comfortable Eyes: PERRL, lids and conjunctivae normal ENMT: Mucous membranes are moist. Posterior pharynx clear of any exudate or lesions.Normal dentition.  Neck: normal, supple, no masses, no thyromegaly Respiratory: clear to auscultation bilaterally, no wheezing, no crackles. Normal respiratory effort. No accessory muscle use.  Cardiovascular: Regular rate and rhythm, no murmurs / rubs / gallops. No extremity edema. 2+ pedal pulses. No carotid bruits.  Abdomen: no tenderness, no masses palpated. No hepatosplenomegaly. Bowel sounds positive.  Musculoskeletal: Good range of motion, no joint swelling or  tenderness, Skin: no rashes, lesions, ulcers. No induration Neurologic: CN 2-12 grossly intact. Sensation intact, DTR normal. Strength 5/5 in all 4.  Psychiatric: Normal judgment and insight. Alert and oriented x 3. Normal mood   Condition at discharge: good  The results of significant diagnostics from this hospitalization (including imaging, microbiology, ancillary and laboratory) are listed below for reference.   Imaging Studies: CT Abdomen Pelvis W  Contrast  Result Date: 06/11/2022 CLINICAL DATA:  Sickle cell pain in the lower back.  Leukocytosis. EXAM: CT ABDOMEN AND PELVIS WITH CONTRAST TECHNIQUE: Multidetector CT imaging of the abdomen and pelvis was performed using the standard protocol following bolus administration of intravenous contrast. RADIATION DOSE REDUCTION: This exam was performed according to the departmental dose-optimization program which includes automated exposure control, adjustment of the mA and/or kV according to patient size and/or use of iterative reconstruction technique. CONTRAST:  OMNIPAQUE IOHEXOL 300 MG/ML  SOLN COMPARISON:  None Available. FINDINGS: Lower chest: Linear densities at the lung bases attributed to scarring. Hepatobiliary: No focal liver abnormality.No evidence of biliary obstruction or stone. Pancreas: Unremarkable. Spleen: History of splenectomy. Adrenals/Urinary Tract: Negative adrenals. No hydronephrosis or stone. Full urinary bladder, otherwise unremarkable. Stomach/Bowel: Presumed recent meal. No bowel obstruction or visible inflammation, including appendicitis . Vascular/Lymphatic: No acute vascular abnormality. No mass or adenopathy. Reproductive:No pathologic findings. Other: No ascites or pneumoperitoneum. 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 Unremarkable left hip arthroplasty. No acute osseous finding. IMPRESSION: 1. No acute finding. 2. Full urinary bladder reaching the umbilicus. 3. 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 Electronically Signed   By: Tiburcio Pea M.D.   On: 06/11/2022 07:46   CT L-SPINE NO CHARGE  Result Date: 06/11/2022 CLINICAL DATA:  Sickle cell pain in the lower back for 2 days EXAM: CT Lumbar Spine with contrast TECHNIQUE: Technique: Multiplanar CT images of the lumbar spine were reconstructed from contemporary CT of the Abdomen and Pelvis. RADIATION DOSE  REDUCTION: This exam was performed according to the departmental dose-optimization program which includes automated exposure control, adjustment of the mA and/or kV according to patient size and/or use of iterative reconstruction technique. CONTRAST:  None additional COMPARISON:  None Available. FINDINGS: Segmentation: 5 lumbar type vertebrae Alignment: Straightening of lumbar lordosis Vertebrae: Diffuse sclerosis and heterogeneity of the visualized skeleton, typical of sickle cell disease. Chronic diffuse endplate concavities without discrete fracture line or focal collapse. Paraspinal and other soft tissues: Reported separately Disc levels: No bony impingement IMPRESSION: Ordinary changes of sickle cell disease throughout the visible skeleton. No acute or focal finding. Electronically Signed   By: Tiburcio Pea M.D.   On: 06/11/2022 07:43   DG Chest Portable 1 View  Result Date: 06/11/2022 CLINICAL DATA:  21 year old male with history of leukocytosis. Sickle cell pain crisis. Severe low back pain. EXAM: PORTABLE CHEST 1 VIEW COMPARISON:  Chest x-ray 02/11/2022. FINDINGS: Lung volumes are low. No consolidative airspace disease. No pleural effusions. No pneumothorax. No pulmonary nodule or mass noted. Pulmonary vasculature and the cardiomediastinal silhouette are within normal limits. Diffuse sclerosis throughout the visualized skeletal structures, related to known history of sickle cell disease. IMPRESSION: 1. Low lung volumes without radiographic evidence of acute cardiopulmonary disease. Electronically Signed   By: Trudie Reed M.D.   On: 06/11/2022 07:05    Microbiology: Results for orders placed or performed during the hospital encounter of 10/11/21  Resp Panel by RT-PCR (Flu A&B, Covid) Nasopharyngeal Swab  Status: None   Collection Time: 10/11/21 10:44 PM   Specimen: Nasopharyngeal Swab; Nasopharyngeal(NP) swabs in vial transport medium  Result Value Ref Range Status   SARS Coronavirus 2  by RT PCR NEGATIVE NEGATIVE Final    Comment: (NOTE) SARS-CoV-2 target nucleic acids are NOT DETECTED.  The SARS-CoV-2 RNA is generally detectable in upper respiratory specimens during the acute phase of infection. The lowest concentration of SARS-CoV-2 viral copies this assay can detect is 138 copies/mL. A negative result does not preclude SARS-Cov-2 infection and should not be used as the sole basis for treatment or other patient management decisions. A negative result may occur with  improper specimen collection/handling, submission of specimen other than nasopharyngeal swab, presence of viral mutation(s) within the areas targeted by this assay, and inadequate number of viral copies(<138 copies/mL). A negative result must be combined with clinical observations, patient history, and epidemiological information. The expected result is Negative.  Fact Sheet for Patients:  BloggerCourse.com  Fact Sheet for Healthcare Providers:  SeriousBroker.it  This test is no t yet approved or cleared by the [place] FDA and  has been authorized for detection and/or diagnosis of SARS-CoV-2 by FDA under an Emergency Use Authorization (EUA). This EUA will remain  in effect (meaning this test can be used) for the duration of the COVID-19 declaration under Section 564(b)(1) of the Act, 21 U.S.C.section 360bbb-3(b)(1), unless the authorization is terminated  or revoked sooner.       Influenza A by PCR NEGATIVE NEGATIVE Final   Influenza B by PCR NEGATIVE NEGATIVE Final    Comment: (NOTE) The Xpert Xpress SARS-CoV-2/FLU/RSV plus assay is intended as an aid in the diagnosis of influenza from Nasopharyngeal swab specimens and should not be used as a sole basis for treatment. Nasal washings and aspirates are unacceptable for Xpert Xpress SARS-CoV-2/FLU/RSV testing.  Fact Sheet for Patients: BloggerCourse.com  Fact  Sheet for Healthcare Providers: SeriousBroker.it  This test is not yet approved or cleared by the [place] FDA and has been authorized for detection and/or diagnosis of SARS-CoV-2 by FDA under an Emergency Use Authorization (EUA). This EUA will remain in effect (meaning this test can be used) for the duration of the COVID-19 declaration under Section 564(b)(1) of the Act, 21 U.S.C. section 360bbb-3(b)(1), unless the authorization is terminated or revoked.  Performed at [facility], [address], [place], [place] [address]     Labs: CBC: Recent Labs  Lab 06/11/22 0527 06/12/22 0547 06/13/22 0557  WBC 22.1* 18.6* 15.4*  NEUTROABS 16.9*  --   --   HGB 9.3* 8.3* 8.1*  HCT 26.8* 24.6* 24.1*  MCV 85.6 87.9 88.0  PLT 371 349 362   Basic Metabolic Panel: Recent Labs  Lab 06/11/22 0527 06/12/22 0547 06/13/22 0557  NA 137 136 137  K 3.9 4.2 3.9  CL 106 102 102  CO2 23 27 27   GLUCOSE 138* 100* 98  BUN 12 11 9   CREATININE 1.00 0.71 0.71  CALCIUM 9.3 8.9 9.2   Liver Function Tests: Recent Labs  Lab 06/11/22 0527  AST 21  ALT 9  ALKPHOS 108  BILITOT 2.9*  PROT 8.1  ALBUMIN 4.5   CBG: No results for input(s): "GLUCAP" in the last 168 hours.  Discharge time spent: greater than 30 minutes.  Signed , MD [facility] 06/14/2022

## 2022-07-04 ENCOUNTER — Non-Acute Institutional Stay (HOSPITAL_COMMUNITY)
Admission: RE | Admit: 2022-07-04 | Discharge: 2022-07-04 | Disposition: A | Discharge status: Home / Self Care | Payer: Commercial Managed Care - PPO | Source: Ambulatory Visit | Attending: Internal Medicine | Admitting: Internal Medicine

## 2022-07-04 ENCOUNTER — Other Ambulatory Visit: Payer: Self-pay | Admitting: Family Medicine

## 2022-07-04 DIAGNOSIS — D571 Sickle-cell disease without crisis: Secondary | ICD-10-CM | POA: Diagnosis present

## 2022-07-04 LAB — COMPREHENSIVE METABOLIC PANEL
ALT: 7 U/L (ref 0–44)
AST: 14 U/L — ABNORMAL LOW (ref 15–41)
Albumin: 3.7 g/dL (ref 3.5–5.0)
Alkaline Phosphatase: 98 U/L (ref 38–126)
Anion gap: 7 (ref 5–15)
BUN: <5 mg/dL — ABNORMAL LOW (ref 6–20)
CO2: 27 mmol/L (ref 22–32)
Calcium: 9.3 mg/dL (ref 8.9–10.3)
Chloride: 104 mmol/L (ref 98–111)
Creatinine, Ser: 0.92 mg/dL (ref 0.61–1.24)
GFR, Estimated: >60 mL/min (ref >60)
Glucose, Bld: 99 mg/dL (ref 70–99)
Potassium: 3.7 mmol/L (ref 3.5–5.1)
Sodium: 138 mmol/L (ref 135–145)
Total Bilirubin: 0.6 mg/dL (ref 0.3–1.2)
Total Protein: 8.9 g/dL — ABNORMAL HIGH (ref 6.5–8.1)

## 2022-07-04 LAB — CBC WITH DIFFERENTIAL/PLATELET
Abs Immature Granulocytes: 0.03 10*3/uL (ref 0.00–0.07)
Basophils Absolute: 0.2 10*3/uL — ABNORMAL HIGH (ref 0.0–0.1)
Basophils Relative: 1 %
Eosinophils Absolute: 0.3 10*3/uL (ref 0.0–0.5)
Eosinophils Relative: 2 %
HCT: 25.6 % — ABNORMAL LOW (ref 39.0–52.0)
Hemoglobin: 8.1 g/dL — ABNORMAL LOW (ref 13.0–17.0)
Immature Granulocytes: 0 %
Lymphocytes Relative: 28 %
Lymphs Abs: 3.2 10*3/uL (ref 0.7–4.0)
MCH: 26 pg (ref 26.0–34.0)
MCHC: 31.6 g/dL (ref 30.0–36.0)
MCV: 82.1 fL (ref 80.0–100.0)
Monocytes Absolute: 1 10*3/uL (ref 0.1–1.0)
Monocytes Relative: 9 %
Neutro Abs: 7 10*3/uL (ref 1.7–7.7)
Neutrophils Relative %: 60 %
Platelets: 694 10*3/uL — ABNORMAL HIGH (ref 150–400)
RBC: 3.12 MIL/uL — ABNORMAL LOW (ref 4.22–5.81)
RDW: 23.4 % — ABNORMAL HIGH (ref 11.5–15.5)
WBC: 11.6 10*3/uL — ABNORMAL HIGH (ref 4.0–10.5)
nRBC: 2 % — ABNORMAL HIGH (ref 0.0–0.2)

## 2022-07-04 LAB — RETICULOCYTES
Immature Retic Fract: 42.4 % — ABNORMAL HIGH (ref 2.3–15.9)
RBC.: 3.18 MIL/uL — ABNORMAL LOW (ref 4.22–5.81)
Retic Count, Absolute: 202.2 10*3/uL — ABNORMAL HIGH (ref 19.0–186.0)
Retic Ct Pct: 6.4 % — ABNORMAL HIGH (ref 0.4–3.1)

## 2022-07-04 MED ORDER — SODIUM CHLORIDE 0.9 % IV SOLN
5.0000 mg/kg | Freq: Once | INTRAVENOUS | Status: AC
  Administered 2022-07-04: 340 mg via INTRAVENOUS
  Filled 2022-07-04: qty 34

## 2022-07-04 MED ORDER — SODIUM CHLORIDE 0.9 % IV SOLN: INTRAVENOUS | Status: DC | PRN

## 2022-07-04 NOTE — Progress Notes (Signed)
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 No premeds required per orders. Labs (CBC, CMP, Retic) drawn prior to infusion. Patient tolerated infusion well. IV line flushed with 25 cc NS after infusion completed per protocol.  Vital signs stable. Patient instructed to schedule next infusion in 1 month prior to leaving, verbalized understanding. AVS offered but patient refused. Patient alert, oriented and ambulatory at discharge.

## 2022-08-01 ENCOUNTER — Inpatient Hospital Stay (HOSPITAL_COMMUNITY)
Admission: RE | Admit: 2022-08-01 | Discharge: 2022-08-01 | Disposition: A | Discharge status: Home / Self Care | Payer: Commercial Managed Care - PPO | Source: Ambulatory Visit

## 2022-08-01 ENCOUNTER — Other Ambulatory Visit: Payer: Self-pay | Admitting: Family Medicine

## 2022-08-01 DIAGNOSIS — D571 Sickle-cell disease without crisis: Secondary | ICD-10-CM

## 2022-08-11 ENCOUNTER — Telehealth (HOSPITAL_COMMUNITY): Payer: Self-pay | Admitting: General Practice

## 2022-08-11 ENCOUNTER — Inpatient Hospital Stay (HOSPITAL_COMMUNITY)
Admission: EM | Admit: 2022-08-11 | Discharge: 2022-08-19 | DRG: 811 | Disposition: A | Discharge status: Home / Self Care | Payer: Commercial Managed Care - PPO | Attending: Internal Medicine | Admitting: Internal Medicine

## 2022-08-11 ENCOUNTER — Other Ambulatory Visit: Payer: Self-pay

## 2022-08-11 ENCOUNTER — Emergency Department (HOSPITAL_COMMUNITY): Payer: Commercial Managed Care - PPO

## 2022-08-11 ENCOUNTER — Non-Acute Institutional Stay (HOSPITAL_BASED_OUTPATIENT_CLINIC_OR_DEPARTMENT_OTHER)
Admission: AD | Admit: 2022-08-11 | Discharge: 2022-08-11 | Disposition: A | Discharge status: Home / Self Care | Payer: Commercial Managed Care - PPO | Source: Ambulatory Visit | Attending: Internal Medicine | Admitting: Internal Medicine

## 2022-08-11 DIAGNOSIS — R0902 Hypoxemia: Secondary | ICD-10-CM | POA: Diagnosis present

## 2022-08-11 DIAGNOSIS — R7401 Elevation of levels of liver transaminase levels: Secondary | ICD-10-CM

## 2022-08-11 DIAGNOSIS — Z1152 Encounter for screening for COVID-19: Secondary | ICD-10-CM

## 2022-08-11 DIAGNOSIS — Z79891 Long term (current) use of opiate analgesic: Secondary | ICD-10-CM

## 2022-08-11 DIAGNOSIS — G894 Chronic pain syndrome: Secondary | ICD-10-CM | POA: Diagnosis present

## 2022-08-11 DIAGNOSIS — D5700 Hb-SS disease with crisis, unspecified: Secondary | ICD-10-CM | POA: Insufficient documentation

## 2022-08-11 DIAGNOSIS — R7309 Other abnormal glucose: Secondary | ICD-10-CM

## 2022-08-11 DIAGNOSIS — J4520 Mild intermittent asthma, uncomplicated: Secondary | ICD-10-CM | POA: Diagnosis present

## 2022-08-11 DIAGNOSIS — Z825 Family history of asthma and other chronic lower respiratory diseases: Secondary | ICD-10-CM

## 2022-08-11 DIAGNOSIS — Z96642 Presence of left artificial hip joint: Secondary | ICD-10-CM | POA: Diagnosis present

## 2022-08-11 DIAGNOSIS — J101 Influenza due to other identified influenza virus with other respiratory manifestations: Secondary | ICD-10-CM | POA: Diagnosis present

## 2022-08-11 DIAGNOSIS — J111 Influenza due to unidentified influenza virus with other respiratory manifestations: Principal | ICD-10-CM | POA: Diagnosis present

## 2022-08-11 DIAGNOSIS — D5701 Hb-SS disease with acute chest syndrome: Secondary | ICD-10-CM | POA: Diagnosis not present

## 2022-08-11 DIAGNOSIS — Z9081 Acquired absence of spleen: Secondary | ICD-10-CM

## 2022-08-11 DIAGNOSIS — G8929 Other chronic pain: Secondary | ICD-10-CM | POA: Diagnosis present

## 2022-08-11 DIAGNOSIS — D571 Sickle-cell disease without crisis: Secondary | ICD-10-CM

## 2022-08-11 DIAGNOSIS — Z888 Allergy status to other drugs, medicaments and biological substances status: Secondary | ICD-10-CM

## 2022-08-11 DIAGNOSIS — D638 Anemia in other chronic diseases classified elsewhere: Secondary | ICD-10-CM | POA: Diagnosis present

## 2022-08-11 DIAGNOSIS — R059 Cough, unspecified: Secondary | ICD-10-CM | POA: Insufficient documentation

## 2022-08-11 DIAGNOSIS — J1000 Influenza due to other identified influenza virus with unspecified type of pneumonia: Secondary | ICD-10-CM | POA: Diagnosis present

## 2022-08-11 LAB — RESP PANEL BY RT-PCR (RSV, FLU A&B, COVID)  RVPGX2
Influenza A by PCR: POSITIVE — AB
Influenza B by PCR: NEGATIVE
Resp Syncytial Virus by PCR: NEGATIVE
SARS Coronavirus 2 by RT PCR: NEGATIVE

## 2022-08-11 LAB — D-DIMER, QUANTITATIVE: D-Dimer, Quant: 9.34 ug/mL-FEU — ABNORMAL HIGH (ref 0.00–0.50)

## 2022-08-11 LAB — CBC WITH DIFFERENTIAL/PLATELET
Abs Immature Granulocytes: 0.36 10*3/uL — ABNORMAL HIGH (ref 0.00–0.07)
Abs Immature Granulocytes: 0 10*3/uL (ref 0.00–0.07)
Band Neutrophils: 2 %
Basophils Absolute: 0.1 10*3/uL (ref 0.0–0.1)
Basophils Absolute: 0 10*3/uL (ref 0.0–0.1)
Basophils Relative: 1 %
Basophils Relative: 0 %
Eosinophils Absolute: 0.1 10*3/uL (ref 0.0–0.5)
Eosinophils Absolute: 0 10*3/uL (ref 0.0–0.5)
Eosinophils Relative: 1 %
Eosinophils Relative: 0 %
HCT: 30 % — ABNORMAL LOW (ref 39.0–52.0)
HCT: 26.1 % — ABNORMAL LOW (ref 39.0–52.0)
Hemoglobin: 9.9 g/dL — ABNORMAL LOW (ref 13.0–17.0)
Hemoglobin: 8.8 g/dL — ABNORMAL LOW (ref 13.0–17.0)
Immature Granulocytes: 3 %
Lymphocytes Relative: 29 %
Lymphocytes Relative: 10 %
Lymphs Abs: 3.3 10*3/uL (ref 0.7–4.0)
Lymphs Abs: 1.6 10*3/uL (ref 0.7–4.0)
MCH: 24.8 pg — ABNORMAL LOW (ref 26.0–34.0)
MCH: 24.8 pg — ABNORMAL LOW (ref 26.0–34.0)
MCHC: 33 g/dL (ref 30.0–36.0)
MCHC: 33.7 g/dL (ref 30.0–36.0)
MCV: 75.2 fL — ABNORMAL LOW (ref 80.0–100.0)
MCV: 73.5 fL — ABNORMAL LOW (ref 80.0–100.0)
Monocytes Absolute: 1 10*3/uL (ref 0.1–1.0)
Monocytes Absolute: 0.6 10*3/uL (ref 0.1–1.0)
Monocytes Relative: 9 %
Monocytes Relative: 4 %
Neutro Abs: 6.7 10*3/uL (ref 1.7–7.7)
Neutro Abs: 13.9 10*3/uL — ABNORMAL HIGH (ref 1.7–7.7)
Neutrophils Relative %: 57 %
Neutrophils Relative %: 84 %
Platelets: 257 10*3/uL (ref 150–400)
Platelets: 259 10*3/uL (ref 150–400)
RBC: 3.99 MIL/uL — ABNORMAL LOW (ref 4.22–5.81)
RBC: 3.55 MIL/uL — ABNORMAL LOW (ref 4.22–5.81)
RDW: 24.9 % — ABNORMAL HIGH (ref 11.5–15.5)
RDW: 25.4 % — ABNORMAL HIGH (ref 11.5–15.5)
WBC: 11.6 10*3/uL — ABNORMAL HIGH (ref 4.0–10.5)
WBC: 16.2 10*3/uL — ABNORMAL HIGH (ref 4.0–10.5)
nRBC: 23.4 % — ABNORMAL HIGH (ref 0.0–0.2)
nRBC: 2.2 % — ABNORMAL HIGH (ref 0.0–0.2)
nRBC: 51 /100 WBC — ABNORMAL HIGH

## 2022-08-11 LAB — COMPREHENSIVE METABOLIC PANEL
ALT: 12 U/L (ref 0–44)
ALT: 11 U/L (ref 0–44)
AST: 40 U/L (ref 15–41)
AST: 56 U/L — ABNORMAL HIGH (ref 15–41)
Albumin: 4.1 g/dL (ref 3.5–5.0)
Albumin: 3.6 g/dL (ref 3.5–5.0)
Alkaline Phosphatase: 91 U/L (ref 38–126)
Alkaline Phosphatase: 94 U/L (ref 38–126)
Anion gap: 8 (ref 5–15)
Anion gap: 9 (ref 5–15)
BUN: 7 mg/dL (ref 6–20)
BUN: 10 mg/dL (ref 6–20)
CO2: 25 mmol/L (ref 22–32)
CO2: 23 mmol/L (ref 22–32)
Calcium: 8.9 mg/dL (ref 8.9–10.3)
Calcium: 9 mg/dL (ref 8.9–10.3)
Chloride: 105 mmol/L (ref 98–111)
Chloride: 107 mmol/L (ref 98–111)
Creatinine, Ser: 0.72 mg/dL (ref 0.61–1.24)
Creatinine, Ser: 0.72 mg/dL (ref 0.61–1.24)
GFR, Estimated: >60 mL/min (ref >60)
GFR, Estimated: >60 mL/min (ref >60)
Glucose, Bld: 109 mg/dL — ABNORMAL HIGH (ref 70–99)
Glucose, Bld: 131 mg/dL — ABNORMAL HIGH (ref 70–99)
Potassium: 3.6 mmol/L (ref 3.5–5.1)
Potassium: 3.8 mmol/L (ref 3.5–5.1)
Sodium: 138 mmol/L (ref 135–145)
Sodium: 139 mmol/L (ref 135–145)
Total Bilirubin: 2 mg/dL — ABNORMAL HIGH (ref 0.3–1.2)
Total Bilirubin: 2 mg/dL — ABNORMAL HIGH (ref 0.3–1.2)
Total Protein: 8.3 g/dL — ABNORMAL HIGH (ref 6.5–8.1)
Total Protein: 7.8 g/dL (ref 6.5–8.1)

## 2022-08-11 LAB — RETICULOCYTES
Immature Retic Fract: 4.5 % (ref 2.3–15.9)
Immature Retic Fract: 6.9 % (ref 2.3–15.9)
RBC.: 3.94 MIL/uL — ABNORMAL LOW (ref 4.22–5.81)
RBC.: 3.5 MIL/uL — ABNORMAL LOW (ref 4.22–5.81)
Retic Count, Absolute: 75.3 10*3/uL (ref 19.0–186.0)
Retic Count, Absolute: 62 10*3/uL (ref 19.0–186.0)
Retic Ct Pct: 1.9 % (ref 0.4–3.1)
Retic Ct Pct: 1.8 % (ref 0.4–3.1)

## 2022-08-11 LAB — LACTATE DEHYDROGENASE: LDH: 429 U/L — ABNORMAL HIGH (ref 98–192)

## 2022-08-11 MED ORDER — IOHEXOL 350 MG/ML SOLN
75.0000 mL | Freq: Once | INTRAVENOUS | Status: AC | PRN
  Administered 2022-08-11: 75 mL via INTRAVENOUS

## 2022-08-11 MED ORDER — OSELTAMIVIR PHOSPHATE 75 MG PO CAPS: 75.0000 mg | ORAL_CAPSULE | Freq: Two times a day (BID) | ORAL | 0 refills | Status: DC

## 2022-08-11 MED ORDER — KETOROLAC TROMETHAMINE 30 MG/ML IJ SOLN
15.0000 mg | Freq: Once | INTRAMUSCULAR | Status: AC
  Administered 2022-08-11: 15 mg via INTRAVENOUS
  Filled 2022-08-11: qty 1

## 2022-08-11 MED ORDER — SODIUM CHLORIDE 0.9 % IV BOLUS
1000.0000 mL | Freq: Once | INTRAVENOUS | Status: AC
  Administered 2022-08-11: 1000 mL via INTRAVENOUS

## 2022-08-11 MED ORDER — HYDROMORPHONE HCL 1 MG/ML IJ SOLN
1.0000 mg | Freq: Once | INTRAMUSCULAR | Status: AC
  Administered 2022-08-11: 1 mg via INTRAVENOUS
  Filled 2022-08-11: qty 1

## 2022-08-11 MED ORDER — NALOXONE HCL 0.4 MG/ML IJ SOLN: 0.4000 mg | INTRAMUSCULAR | Status: DC | PRN

## 2022-08-11 MED ORDER — SODIUM CHLORIDE 0.9% FLUSH: 9.0000 mL | INTRAVENOUS | Status: DC | PRN

## 2022-08-11 MED ORDER — OSELTAMIVIR PHOSPHATE 75 MG PO CAPS
75.0000 mg | ORAL_CAPSULE | Freq: Once | ORAL | Status: AC
  Administered 2022-08-11: 75 mg via ORAL
  Filled 2022-08-11: qty 1

## 2022-08-11 MED ORDER — OXYCODONE HCL 5 MG PO TABS
10.0000 mg | ORAL_TABLET | Freq: Once | ORAL | Status: AC
  Administered 2022-08-11: 10 mg via ORAL
  Filled 2022-08-11: qty 2

## 2022-08-11 MED ORDER — SODIUM CHLORIDE 0.45 % IV SOLN: INTRAVENOUS | Status: DC

## 2022-08-11 MED ORDER — GABAPENTIN 100 MG PO CAPS
100.0000 mg | ORAL_CAPSULE | Freq: Once | ORAL | Status: AC
  Administered 2022-08-11: 100 mg via ORAL
  Filled 2022-08-11 (×2): qty 1

## 2022-08-11 MED ORDER — HYDROMORPHONE 1 MG/ML IV SOLN
INTRAVENOUS | Status: DC
  Administered 2022-08-11: 8.5 mg via INTRAVENOUS
  Administered 2022-08-11: 30 mg via INTRAVENOUS
  Filled 2022-08-11: qty 30

## 2022-08-11 MED ORDER — OXYCODONE HCL 10 MG PO TABS: 10.0000 mg | ORAL_TABLET | ORAL | 0 refills | Status: DC | PRN

## 2022-08-11 MED ORDER — HYDROMORPHONE HCL 2 MG/ML IJ SOLN
2.0000 mg | Freq: Once | INTRAMUSCULAR | Status: AC
  Administered 2022-08-11: 2 mg via SUBCUTANEOUS
  Filled 2022-08-11: qty 1

## 2022-08-11 MED ORDER — ONDANSETRON HCL 4 MG/2ML IJ SOLN
INTRAMUSCULAR | Status: AC
  Filled 2022-08-11: qty 2

## 2022-08-11 MED ORDER — ONDANSETRON HCL 4 MG/2ML IJ SOLN: 4.0000 mg | Freq: Four times a day (QID) | INTRAMUSCULAR | Status: DC | PRN

## 2022-08-11 MED ORDER — KETOROLAC TROMETHAMINE 30 MG/ML IJ SOLN
30.0000 mg | Freq: Once | INTRAMUSCULAR | Status: AC
  Administered 2022-08-11: 30 mg via INTRAVENOUS
  Filled 2022-08-11: qty 1

## 2022-08-11 MED ORDER — ACETAMINOPHEN 500 MG PO TABS
1000.0000 mg | ORAL_TABLET | Freq: Once | ORAL | Status: AC
  Administered 2022-08-11: 1000 mg via ORAL
  Filled 2022-08-11: qty 2

## 2022-08-11 MED ORDER — ONDANSETRON 4 MG PO TBDP: 4.0000 mg | ORAL_TABLET | Freq: Once | ORAL | Status: DC

## 2022-08-11 NOTE — H&P (Signed)
[facility] History and Physical   Date: 08/11/2022  Patient name: Jonathan [name] Medical record number: [id number] Date of birth: 03-11-2001 Age: 21 y.o. Gender: male PCP: Quentin Angst, MD  Attending physician: Quentin Angst, MD  Chief Complaint: Sickle cell pain   History of Present Illness: Jonathan [name] is a 21 year old male with a medical history significant for sickle cell disease, and anemia of chronic disease presents with complaints of bilateral lower extremity pain consistent with his typical pain crisis.  Patient reports upper respiratory symptoms including cough and congestion over the past several days.  Symptoms minimally improved with over-the-counter Coricidin.  Patient also states that he has been taking oxycodone without very much relief to his lower extremities.  He feels that pain crisis was precipitated by upper respiratory infection.  Patient rates his lower extremity pain as constant and throbbing.  Patient endorses headache and some shortness of breath.  He denies any fever, chills, dizziness, or paresthesias.  No urinary symptoms, nausea, vomiting, or diarrhea.  Meds: Medications Prior to Admission  Medication Sig Dispense Refill Last Dose   ibuprofen (ADVIL) 200 MG tablet Take 4 tablets (800 mg total) by mouth every 6 (six) hours as needed (pain). 30 tablet 0    methocarbamol (ROBAXIN) 500 MG tablet Take 1 tablet (500 mg total) by mouth every 8 (eight) hours as needed for muscle spasms. 60 tablet 0    Oxycodone HCl 10 MG TABS Take 1 tablet (10 mg total) by mouth every 4 (four) hours as needed (pain). 60 tablet 0     Allergies: Allergy medication [diphenhydramine] Past Medical History:  Diagnosis Date   Acute chest syndrome(517.3) 08/26/2011   march 2013, Sept 2013   Asthma    Avascular necrosis of bone of left hip (HCC)    Sickle cell disease, type SS (HCC)    Past Surgical History:  Procedure Laterality Date    ADENOIDECTOMY     SPLENECTOMY     TONSILLECTOMY     TONSILLECTOMY AND ADENOIDECTOMY     TOTAL HIP ARTHROPLASTY Left 03/07/2016   UMBILICAL HERNIA REPAIR     Family History  Problem Relation Age of Onset   Hypertension Mother    Hypertension Maternal Grandmother    Hypertension Maternal Grandfather    Asthma Paternal Grandfather    Diabetes Paternal Grandfather    Social History   Socioeconomic History   Marital status: Single    Spouse name: Not on file   Number of children: Not on file   Years of education: Not on file   Highest education level: Not on file  Occupational History   Not on file  Tobacco Use   Smoking status: Never   Smokeless tobacco: Never  Vaping Use   Vaping Use: Never used  Substance and Sexual Activity   Alcohol use: No    Alcohol/week: 0.0 standard drinks of alcohol   Drug use: No   Sexual activity: Never  Other Topics Concern   Not on file  Social History Narrative   Lives with mother.  No smokers at home      Update:       05/02/16 Lives with Mother only   Social Determinants of Health   Financial Resource Strain: Not on file  Food Insecurity: No Food Insecurity (06/11/2022)   Hunger Vital Sign    Worried About Running Out of Food in the Last Year: Never true    Ran Out of Food in  the Last Year: Never true  Transportation Needs: No Transportation Needs (06/11/2022)   PRAPARE - Administrator, Civil Service (Medical): No    Lack of Transportation (Non-Medical): No  Physical Activity: Not on file  Stress: Not on file  Social Connections: Not on file  Intimate Partner Violence: Not At Risk (06/11/2022)   Humiliation, Afraid, Rape, and Kick questionnaire    Fear of Current or Ex-Partner: No    Emotionally Abused: No    Physically Abused: No    Sexually Abused: No   Review of Systems  Constitutional: Negative.   HENT: Negative.    Respiratory: Negative.    Cardiovascular: Negative.   Gastrointestinal: Negative.    Genitourinary: Negative.   Musculoskeletal:  Positive for back pain and joint pain.  Skin: Negative.   Neurological: Negative.   Psychiatric/Behavioral: Negative.      Physical Exam: There were no vitals taken for this visit. Physical Exam Constitutional:      Appearance: Normal appearance.  Eyes:     Pupils: Pupils are equal, round, and reactive to light.  Cardiovascular:     Rate and Rhythm: Normal rate and regular rhythm.     Pulses: Normal pulses.  Pulmonary:     Effort: Pulmonary effort is normal.  Abdominal:     General: Bowel sounds are normal.  Musculoskeletal:        General: Normal range of motion.  Skin:    General: Skin is warm.  Neurological:     General: No focal deficit present.     Mental Status: He is alert. Mental status is at baseline.  Psychiatric:        Mood and Affect: Mood normal.        Behavior: Behavior normal.        Thought Content: Thought content normal.        Judgment: Judgment normal.      Lab results: No results found for this or any previous visit (from the past 24 hour(s)).  Imaging results:  No results found.   Assessment & Plan: Patient admitted to sickle cell day infusion center for management of pain crisis.  Patient is opiate tolerant Initiate IV dilaudid PCA.  IV fluids, 0.45% saline at 150 mL/h Toradol 15 mg IV times one dose Tylenol 1000 mg by mouth times one dose Review CBC with differential, complete metabolic panel, and reticulocytes as results become available Pain intensity will be reevaluated in context of functioning and relationship to baseline as care progresses If pain intensity remains elevated and/or sudden change in hemodynamic stability transition to inpatient services for higher level of care.     [name]  APRN, MSN, FNP-C Patient Care [facility] [address] [place], [place] [address] [phone]  08/11/2022, 9:22 AM

## 2022-08-11 NOTE — Progress Notes (Signed)
When pt lost IV access PCA paused and new IV placed, PCA restarted. Pt lost IV access again, and per provider we will discontinue PCA. PCA paused and amount received documented (8.5 mg). Pyxis would not allow waste of 21 mg. PCA syringe wasted 21 mg in sterile waste bin in med room and witnessed by Louie Bun, Charity fundraiser.

## 2022-08-11 NOTE — Telephone Encounter (Signed)
Patient called, requesting to come to the day hospital due to pain in the lower back and left leg  rated at 7/10. Denied chest pain, fever, diarrhea, abdominal pain, nausea/vomitting. Screened negative for Covid-19 symptoms. Admitted to having means of transportation without driving self after treatment (roommate). Last took Ibuprofen 800 mg at 06:00 am today. Per provider, patient can come to the day hospital for treatment. Patient notified, verbalized understanding.

## 2022-08-11 NOTE — ED Notes (Signed)
Provider gave confirmation for IV zofran instead of PO

## 2022-08-11 NOTE — Discharge Instructions (Addendum)
Take the pain medicine that was prescribed for you.  You could also add Tylenol and Motrin to help with the flu symptoms.  Drink plenty of fluids.  Follow-up with your doctor in 2 to 3 days if not improving

## 2022-08-11 NOTE — ED Notes (Signed)
Pt spo2 noted at 89% on room air pt placed on 2l [place] spo2 increased to 100%

## 2022-08-11 NOTE — ED Triage Notes (Signed)
Patient arrived with complaints of cough since Thursday, then began having diarrhea and vomiting. Seen at [facility] clinic and given fluids today. Complaints of lower back pain radiating to legs related to [facility]

## 2022-08-11 NOTE — Discharge Summary (Signed)
[facility] Discharge Summary   Patient ID: Jonathan [name] MRN: [id number] DOB/AGE: 2001-08-08 21 y.o.  Admit date: 08/11/2022 Discharge date: 08/11/2022  Primary Care Physician:  Quentin Angst, MD  Admission Diagnoses:  Active Problems:   Sickle cell pain crisis [facility])   Discharge Medications:  Allergies as of 08/11/2022       Reactions   Allergy Medication [diphenhydramine] Swelling, Other (See Comments)   Face swells.         Medication List     TAKE these medications    ibuprofen 200 MG tablet Commonly known as: ADVIL Take 4 tablets (800 mg total) by mouth every 6 (six) hours as needed (pain).   methocarbamol 500 MG tablet Commonly known as: ROBAXIN Take 1 tablet (500 mg total) by mouth every 8 (eight) hours as needed for muscle spasms.   Oxycodone HCl 10 MG Tabs Take 1 tablet (10 mg total) by mouth every 4 (four) hours as needed (pain).         Consults:  None  Significant Diagnostic Studies:  No results found.  History of present illness:   [facility] Course:  For complete details please refer to admission H and P, but in brief, ***  Physical Exam at Discharge:  BP 123/81 (BP Location: Right Arm)   Pulse 91   Temp 98.4 F (36.9 C) (Temporal)   Resp 16   SpO2 93%  Physical Exam Constitutional:      Appearance: Normal appearance.  Eyes:     Pupils: Pupils are equal, round, and reactive to light.  Cardiovascular:     Rate and Rhythm: Normal rate and regular rhythm.     Pulses: Normal pulses.  Pulmonary:     Effort: Pulmonary effort is normal.  Abdominal:     General: Bowel sounds are normal.  Musculoskeletal:        General: Normal range of motion.  Skin:    General: Skin is warm.  Neurological:     General: No focal deficit present.     Mental Status: He is alert. Mental status is at baseline.  Psychiatric:        Mood and Affect: Mood normal.        Behavior: Behavior normal.         Thought Content: Thought content normal.        Judgment: Judgment normal.      Disposition at Discharge:   Discharge Orders:   Condition at Discharge:   Stable  Time spent on Discharge:  Greater than 30 minutes.  Signed: [name]  APRN, MSN, FNP-C Patient Care [facility] [address] [place], [place] [address] [phone]  08/11/2022, 4:08 PM

## 2022-08-11 NOTE — ED Provider Notes (Signed)
[facility] DEPT Provider Note   CSN: [id number] Arrival date & time: 08/11/22  2001     History  Chief Complaint  Patient presents with   Sickle Cell Pain Crisis   Emesis    Jonathan [name] is a 21 y.o. male.  Patient has a history of sickle cell disease.  He complains of aching all over.  Minimal cough.  Patient states this does not feel like his normal painful crisis.  Patient complains of mild shortness of breath  The history is provided by the patient and medical records. No language interpreter was used.  Sickle Cell Pain Crisis Location:  Unable to specify Severity:  Moderate Onset quality:  Sudden Similar to previous crisis episodes: no   Timing:  Constant Progression:  Worsening Chronicity:  New Sickle cell genotype:  SS Associated symptoms: fatigue, nausea, shortness of breath and vomiting   Associated symptoms: no chest pain, no congestion, no cough and no headaches   Emesis Associated symptoms: no abdominal pain, no cough, no diarrhea and no headaches        Home Medications Prior to Admission medications   Medication Sig Start Date End Date Taking? Authorizing Provider  ibuprofen (ADVIL) 200 MG tablet Take 4 tablets (800 mg total) by mouth every 6 (six) hours as needed (pain). 06/14/22   Elwyn Reach, MD  methocarbamol (ROBAXIN) 500 MG tablet Take 1 tablet (500 mg total) by mouth every 8 (eight) hours as needed for muscle spasms. 06/14/22   Elwyn Reach, MD  Oxycodone HCl 10 MG TABS Take 1 tablet (10 mg total) by mouth every 4 (four) hours as needed (pain). 08/11/22   Dorena Dew, FNP      Allergies    Allergy medication [diphenhydramine]    Review of Systems   Review of Systems  Constitutional:  Positive for fatigue. Negative for appetite change.  HENT:  Negative for congestion, ear discharge and sinus pressure.   Eyes:  Negative for discharge.  Respiratory:  Positive for shortness of breath. Negative  for cough.   Cardiovascular:  Negative for chest pain.  Gastrointestinal:  Positive for nausea and vomiting. Negative for abdominal pain and diarrhea.  Genitourinary:  Negative for frequency and hematuria.  Musculoskeletal:  Negative for back pain.  Skin:  Negative for rash.  Neurological:  Negative for seizures and headaches.  Psychiatric/Behavioral:  Negative for hallucinations.     Physical Exam Updated Vital Signs BP (!) 137/102   Pulse 96   Temp 97.9 F (36.6 C)   Resp 17   Ht 5\' 5"  (1.651 m)   Wt 68 kg   SpO2 100%   BMI 24.96 kg/m  Physical Exam Vitals and nursing note reviewed.  Constitutional:      Appearance: He is well-developed.  HENT:     Head: Normocephalic.  Eyes:     General: No scleral icterus.    Conjunctiva/sclera: Conjunctivae normal.  Neck:     Thyroid: No thyromegaly.  Cardiovascular:     Rate and Rhythm: Normal rate and regular rhythm.     Heart sounds: No murmur heard.    No friction rub. No gallop.  Pulmonary:     Breath sounds: No stridor. No wheezing or rales.  Chest:     Chest wall: No tenderness.  Abdominal:     General: There is no distension.     Tenderness: There is no abdominal tenderness. There is no rebound.  Musculoskeletal:  General: Normal range of motion.     Cervical back: Neck supple.  Lymphadenopathy:     Cervical: No cervical adenopathy.  Skin:    Findings: No erythema or rash.  Neurological:     Mental Status: He is alert and oriented to person, place, and time.     Motor: No abnormal muscle tone.     Coordination: Coordination normal.  Psychiatric:        Behavior: Behavior normal.     ED Results / Procedures / Treatments   Labs (all labs ordered are listed, but only abnormal results are displayed) Labs Reviewed  RESP PANEL BY RT-PCR (RSV, FLU A&B, COVID)  RVPGX2 - Abnormal; Notable for the following components:      Result Value   Influenza A by PCR POSITIVE (*)    All other components within normal  limits  COMPREHENSIVE METABOLIC PANEL - Abnormal; Notable for the following components:   Glucose, Bld 131 (*)    AST 56 (*)    Total Bilirubin 2.0 (*)    All other components within normal limits  CBC WITH DIFFERENTIAL/PLATELET - Abnormal; Notable for the following components:   WBC 16.2 (*)    RBC 3.55 (*)    Hemoglobin 8.8 (*)    HCT 26.1 (*)    MCV 73.5 (*)    MCH 24.8 (*)    RDW 25.4 (*)    nRBC 2.2 (*)    Neutro Abs 13.9 (*)    nRBC 51 (*)    All other components within normal limits  RETICULOCYTES - Abnormal; Notable for the following components:   RBC. 3.50 (*)    All other components within normal limits  D-DIMER, QUANTITATIVE - Abnormal; Notable for the following components:   D-Dimer, Quant 9.34 (*)    All other components within normal limits    EKG None  Radiology DG Chest 2 View  Result Date: 08/11/2022 CLINICAL DATA:  Shortness of breath, sickle cell crisis, nausea, vomiting, and diarrhea. EXAM: CHEST - 2 VIEW COMPARISON:  06/11/2022. FINDINGS: The heart is enlarged and the mediastinal contour stable. The pulmonary vasculature is distended. Mild airspace disease is noted in the perihilar region on the right. No effusion or pneumothorax. No acute osseous abnormality. IMPRESSION: 1. Cardiomegaly with mildly distended pulmonary vasculature. 2. Mild airspace disease in the perihilar region on the right, possible edema or developing pneumonia. Electronically Signed   By: Thornell Sartorius M.D.   On: 08/11/2022 21:49    Procedures Procedures    Medications Ordered in ED Medications  ondansetron (ZOFRAN-ODT) disintegrating tablet 4 mg (4 mg Oral Not Given 08/11/22 2121)  HYDROmorphone (DILAUDID) injection 1 mg (has no administration in time range)  ondansetron (ZOFRAN) 4 MG/2ML injection (  Given 08/11/22 2121)  HYDROmorphone (DILAUDID) injection 1 mg (1 mg Intravenous Given 08/11/22 2130)  ketorolac (TORADOL) 30 MG/ML injection 30 mg (30 mg Intravenous Given 08/11/22  2131)  sodium chloride 0.9 % bolus 1,000 mL (1,000 mLs Intravenous New Bag/Given 08/11/22 2131)    ED Course/ Medical Decision Making/ A&P  Patient with pain all throughout.  Patient's pain is not typical for painful crisis.  Patient has a positive flu test along with some shortness of breath and elevated D-dimer.  CT angio will be done to rule out PE                         Medical Decision Making Amount and/or Complexity of Data Reviewed Labs:  ordered. Radiology: ordered.  Risk Prescription drug management. Decision regarding hospitalization.   Patient with influenza.  And a painful crisis.  Disposition will be determined by my colleague Dr. Roxanne Mins        Final Clinical Impression(s) / ED Diagnoses Final diagnoses:  None    Rx / DC Orders ED Discharge Orders     None         Milton Ferguson, MD 08/21/22 1657

## 2022-08-11 NOTE — Progress Notes (Signed)
Pt admitted to day hospital today for sickle cell pain. On arrival, pt rated 8/10 pain to lower back and left leg. Pt received Dilaudid PCA, IV Ketorolac 15 mg, and hydrated with IV fluids via PIV.  Pt also received PO 1,000 mg Tylenol and 100 mg Gabapentin. Pt lost IV access 3 different times, so PCA discontinued and pt received 1 x dose of Dilaudid 2 mg subcutaneous injection and 10 mg PO Oxycodone. At discharge pt rated pain 3/10. AVS offered, but pt declined. Pt alert, awake and ambulatory at discharge.

## 2022-08-11 NOTE — ED Provider Notes (Signed)
Care assumed from Dr. Estell Harpin, patient with sickle cell disease and chest pain, positive d-dimer pending CTA of chest.  1:34 AM CT angiogram shows no evidence of pulmonary embolism but probable areas of viral pneumonia.  IV Tapin reviewed the images, and agree with radiologist interpretation.  Radiologist brings up possibility of venoocclusive disease related to sickle cell disease, but he has been maintaining good oxygen saturations on room air and I do not feel that he clinically has the acute chest syndrome.  However, pain has not been adequately controlled.  I increased his dose of hydromorphone to 2 mg which gave him some slight improvement but pain is still not controlled.  I have ordered an additional dose of hydromorphone.  If he does not get adequately relief with that, he will need admission.  Following second dose of hydromorphone 2 mg, patient still has not gotten adequate relief and he will need to be admitted for ongoing Pain management.  Case is discussed with Dr. Margo Aye of [facility], who agrees to admit the patient.   Dione Booze, MD 08/12/22 [phone]

## 2022-08-12 ENCOUNTER — Encounter (HOSPITAL_COMMUNITY): Payer: Self-pay | Admitting: Family Medicine

## 2022-08-12 ENCOUNTER — Telehealth (HOSPITAL_COMMUNITY): Payer: Self-pay

## 2022-08-12 DIAGNOSIS — D5700 Hb-SS disease with crisis, unspecified: Secondary | ICD-10-CM

## 2022-08-12 DIAGNOSIS — R7401 Elevation of levels of liver transaminase levels: Secondary | ICD-10-CM | POA: Diagnosis present

## 2022-08-12 DIAGNOSIS — Z96642 Presence of left artificial hip joint: Secondary | ICD-10-CM | POA: Diagnosis present

## 2022-08-12 DIAGNOSIS — G894 Chronic pain syndrome: Secondary | ICD-10-CM | POA: Diagnosis present

## 2022-08-12 DIAGNOSIS — J1000 Influenza due to other identified influenza virus with unspecified type of pneumonia: Secondary | ICD-10-CM | POA: Diagnosis present

## 2022-08-12 DIAGNOSIS — R0902 Hypoxemia: Secondary | ICD-10-CM | POA: Diagnosis present

## 2022-08-12 DIAGNOSIS — Z825 Family history of asthma and other chronic lower respiratory diseases: Secondary | ICD-10-CM | POA: Diagnosis not present

## 2022-08-12 DIAGNOSIS — R7309 Other abnormal glucose: Secondary | ICD-10-CM | POA: Diagnosis not present

## 2022-08-12 DIAGNOSIS — J4520 Mild intermittent asthma, uncomplicated: Secondary | ICD-10-CM | POA: Diagnosis present

## 2022-08-12 DIAGNOSIS — Z9081 Acquired absence of spleen: Secondary | ICD-10-CM | POA: Diagnosis not present

## 2022-08-12 DIAGNOSIS — D638 Anemia in other chronic diseases classified elsewhere: Secondary | ICD-10-CM | POA: Diagnosis present

## 2022-08-12 DIAGNOSIS — D5701 Hb-SS disease with acute chest syndrome: Secondary | ICD-10-CM | POA: Diagnosis present

## 2022-08-12 DIAGNOSIS — Z888 Allergy status to other drugs, medicaments and biological substances status: Secondary | ICD-10-CM | POA: Diagnosis not present

## 2022-08-12 DIAGNOSIS — Z1152 Encounter for screening for COVID-19: Secondary | ICD-10-CM | POA: Diagnosis not present

## 2022-08-12 DIAGNOSIS — J111 Influenza due to unidentified influenza virus with other respiratory manifestations: Secondary | ICD-10-CM | POA: Diagnosis not present

## 2022-08-12 LAB — CBC WITH DIFFERENTIAL/PLATELET
Abs Immature Granulocytes: 0.48 10*3/uL — ABNORMAL HIGH (ref 0.00–0.07)
Basophils Absolute: 0.1 10*3/uL (ref 0.0–0.1)
Basophils Relative: 0 %
Eosinophils Absolute: 0 10*3/uL (ref 0.0–0.5)
Eosinophils Relative: 0 %
HCT: 23.8 % — ABNORMAL LOW (ref 39.0–52.0)
Hemoglobin: 8.1 g/dL — ABNORMAL LOW (ref 13.0–17.0)
Immature Granulocytes: 3 %
Lymphocytes Relative: 24 %
Lymphs Abs: 3.8 10*3/uL (ref 0.7–4.0)
MCH: 24.7 pg — ABNORMAL LOW (ref 26.0–34.0)
MCHC: 34 g/dL (ref 30.0–36.0)
MCV: 72.6 fL — ABNORMAL LOW (ref 80.0–100.0)
Monocytes Absolute: 1.3 10*3/uL — ABNORMAL HIGH (ref 0.1–1.0)
Monocytes Relative: 8 %
Neutro Abs: 10.4 10*3/uL — ABNORMAL HIGH (ref 1.7–7.7)
Neutrophils Relative %: 65 %
Platelets: 207 10*3/uL (ref 150–400)
RBC: 3.28 MIL/uL — ABNORMAL LOW (ref 4.22–5.81)
RDW: 26.3 % — ABNORMAL HIGH (ref 11.5–15.5)
WBC: 15.9 10*3/uL — ABNORMAL HIGH (ref 4.0–10.5)
nRBC: 2.7 % — ABNORMAL HIGH (ref 0.0–0.2)

## 2022-08-12 LAB — COMPREHENSIVE METABOLIC PANEL
ALT: 11 U/L (ref 0–44)
AST: 74 U/L — ABNORMAL HIGH (ref 15–41)
Albumin: 3.7 g/dL (ref 3.5–5.0)
Alkaline Phosphatase: 91 U/L (ref 38–126)
Anion gap: 8 (ref 5–15)
BUN: 11 mg/dL (ref 6–20)
CO2: 25 mmol/L (ref 22–32)
Calcium: 8.6 mg/dL — ABNORMAL LOW (ref 8.9–10.3)
Chloride: 103 mmol/L (ref 98–111)
Creatinine, Ser: 0.75 mg/dL (ref 0.61–1.24)
GFR, Estimated: >60 mL/min (ref >60)
Glucose, Bld: 141 mg/dL — ABNORMAL HIGH (ref 70–99)
Potassium: 4.5 mmol/L (ref 3.5–5.1)
Sodium: 136 mmol/L (ref 135–145)
Total Bilirubin: 1.2 mg/dL (ref 0.3–1.2)
Total Protein: 7.6 g/dL (ref 6.5–8.1)

## 2022-08-12 LAB — MAGNESIUM: Magnesium: 1.9 mg/dL (ref 1.7–2.4)

## 2022-08-12 LAB — PHOSPHORUS: Phosphorus: 4.6 mg/dL (ref 2.5–4.6)

## 2022-08-12 LAB — PROCALCITONIN: Procalcitonin: 2.31 ng/mL

## 2022-08-12 LAB — LACTATE DEHYDROGENASE: LDH: 1207 U/L — ABNORMAL HIGH (ref 98–192)

## 2022-08-12 MED ORDER — NALOXONE HCL 0.4 MG/ML IJ SOLN: 0.4000 mg | INTRAMUSCULAR | Status: DC | PRN

## 2022-08-12 MED ORDER — OSELTAMIVIR PHOSPHATE 75 MG PO CAPS
75.0000 mg | ORAL_CAPSULE | Freq: Two times a day (BID) | ORAL | Status: AC
  Administered 2022-08-12 – 2022-08-16 (×10): 75 mg via ORAL
  Filled 2022-08-12 (×10): qty 1

## 2022-08-12 MED ORDER — HYDROMORPHONE HCL 1 MG/ML IJ SOLN: 1.0000 mg | Freq: Once | INTRAMUSCULAR | Status: DC

## 2022-08-12 MED ORDER — ACETAMINOPHEN 325 MG PO TABS
650.0000 mg | ORAL_TABLET | Freq: Four times a day (QID) | ORAL | Status: DC | PRN
  Administered 2022-08-16 – 2022-08-18 (×4): 650 mg via ORAL
  Filled 2022-08-12 (×4): qty 2

## 2022-08-12 MED ORDER — HYDROMORPHONE HCL 2 MG/ML IJ SOLN
2.0000 mg | Freq: Once | INTRAMUSCULAR | Status: AC
  Administered 2022-08-12: 2 mg via INTRAVENOUS
  Filled 2022-08-12: qty 1

## 2022-08-12 MED ORDER — SODIUM CHLORIDE 0.9% FLUSH: 9.0000 mL | INTRAVENOUS | Status: DC | PRN

## 2022-08-12 MED ORDER — ONDANSETRON HCL 4 MG/2ML IJ SOLN: 4.0000 mg | Freq: Four times a day (QID) | INTRAMUSCULAR | Status: DC | PRN

## 2022-08-12 MED ORDER — KETOROLAC TROMETHAMINE 30 MG/ML IJ SOLN
15.0000 mg | Freq: Four times a day (QID) | INTRAMUSCULAR | Status: DC
  Administered 2022-08-12 – 2022-08-17 (×19): 15 mg via INTRAVENOUS
  Filled 2022-08-12 (×20): qty 1

## 2022-08-12 MED ORDER — IPRATROPIUM-ALBUTEROL 0.5-2.5 (3) MG/3ML IN SOLN
3.0000 mL | Freq: Four times a day (QID) | RESPIRATORY_TRACT | Status: DC
  Administered 2022-08-12 (×3): 3 mL via RESPIRATORY_TRACT
  Filled 2022-08-12 (×3): qty 3

## 2022-08-12 MED ORDER — IPRATROPIUM-ALBUTEROL 0.5-2.5 (3) MG/3ML IN SOLN
3.0000 mL | Freq: Three times a day (TID) | RESPIRATORY_TRACT | Status: DC
  Administered 2022-08-13: 3 mL via RESPIRATORY_TRACT
  Filled 2022-08-12: qty 3

## 2022-08-12 MED ORDER — ENOXAPARIN SODIUM 40 MG/0.4ML IJ SOSY
40.0000 mg | PREFILLED_SYRINGE | INTRAMUSCULAR | Status: DC
  Administered 2022-08-12 – 2022-08-18 (×7): 40 mg via SUBCUTANEOUS
  Filled 2022-08-12 (×8): qty 0.4

## 2022-08-12 MED ORDER — HYDROMORPHONE HCL 1 MG/ML IJ SOLN
1.0000 mg | INTRAMUSCULAR | Status: DC | PRN
  Administered 2022-08-12 (×3): 1 mg via INTRAVENOUS
  Filled 2022-08-12 (×3): qty 1

## 2022-08-12 MED ORDER — SODIUM CHLORIDE 0.45 % IV SOLN: INTRAVENOUS | Status: AC

## 2022-08-12 MED ORDER — DIPHENHYDRAMINE HCL 25 MG PO CAPS: 25.0000 mg | ORAL_CAPSULE | ORAL | Status: DC | PRN

## 2022-08-12 MED ORDER — HYDROMORPHONE 1 MG/ML IV SOLN
INTRAVENOUS | Status: DC
  Administered 2022-08-12: 30 mg via INTRAVENOUS
  Administered 2022-08-13: 3.5 mg via INTRAVENOUS
  Administered 2022-08-13: 30 mg via INTRAVENOUS
  Administered 2022-08-13: 0.5 mg via INTRAVENOUS
  Administered 2022-08-14: 30 mg via INTRAVENOUS
  Administered 2022-08-14: 4 mg via INTRAVENOUS
  Administered 2022-08-14: 4.5 mg via INTRAVENOUS
  Administered 2022-08-14: 3 mg via INTRAVENOUS
  Administered 2022-08-14: 5 mg via INTRAVENOUS
  Administered 2022-08-14: 1.5 mg via INTRAVENOUS
  Administered 2022-08-14: 5.5 mg via INTRAVENOUS
  Administered 2022-08-15: 3.5 mg via INTRAVENOUS
  Administered 2022-08-15: 4 mg via INTRAVENOUS
  Administered 2022-08-15: 3 mg via INTRAVENOUS
  Administered 2022-08-16: 3.5 mg via INTRAVENOUS
  Administered 2022-08-16: 3 mg via INTRAVENOUS
  Administered 2022-08-16: 30 mg via INTRAVENOUS
  Filled 2022-08-12 (×4): qty 30

## 2022-08-12 MED ORDER — HYDROMORPHONE 1 MG/ML IV SOLN: INTRAVENOUS | Status: DC

## 2022-08-12 MED ORDER — SENNOSIDES-DOCUSATE SODIUM 8.6-50 MG PO TABS
1.0000 | ORAL_TABLET | Freq: Every day | ORAL | Status: DC
  Administered 2022-08-12 – 2022-08-18 (×7): 1 via ORAL
  Filled 2022-08-12 (×7): qty 1

## 2022-08-12 MED ORDER — POLYETHYLENE GLYCOL 3350 17 G PO PACK: 17.0000 g | PACK | Freq: Every day | ORAL | Status: DC | PRN

## 2022-08-12 MED ORDER — HYDROMORPHONE HCL 2 MG/ML IJ SOLN: 2.0000 mg | INTRAMUSCULAR | Status: DC | PRN

## 2022-08-12 NOTE — Progress Notes (Addendum)
Jonathan [name] is a 21 year old male with a medical history significant for sickle cell disease, chronic pain, opiate dependence, anemia of chronic disease, and asthma was admitted overnight for community-acquired pneumonia in the setting of sickle cell pain crisis. However upon reviewing patient's chart, it is found that patient is positive for influenza A.  He continues to have allover body pain, headache, and persistent cough.  Patient has had the symptoms over the past several days and has been unrelieved by over-the-counter Coricidin.  Patient rates his pain as 9/10, primarily to his right knee.   Care Plan:  Sickle cell disease with pain crisis: Patient currently has an oxygen requirement of 4 L/min, which is improving.  Probable acute chest syndrome. May warrant simple exchange transfusion in AM.  Continue to monitor closely. Type and screen, CBC, and LDH in am.  Influenza A positive: Initiate Tamiflu 75 mg every 12 hours for total of 5 days Duonebulizer every 6 hours  Incentive spirometer Tylenol 650 mg every 6 hours as needed.  Maintain oxygen saturation over 90%   Discussed treatment plan with patient's mother, Tori Herbin.    Sickle cell team will reassess in am   [name]  APRN, MSN, FNP-C Patient Care [facility] [address] [place], [place] [address] [phone]

## 2022-08-12 NOTE — H&P (Signed)
History and Physical  Jonathan [name] [id number] DOB: 2000/10/04 DOA: 08/11/2022  Referring physician: Dr. Roxanne Mins, [place]. PCP: Tresa Garter, MD  Outpatient Specialists: Sickle cell clinic Patient coming from: Home.  Chief Complaint: Pain all over.  HPI: Jonathan [name] is a 21 y.o. male with medical history significant for sickle cell disease, chronic pain syndrome with opioid tolerance, asthma, anemia of chronic disease who presented to [facility] ED with complaints of hurting all over.  Denies subjective fevers or chills.  Also endorses a cough for the past few days associated with diarrhea and vomiting.  In the ED workup revealed elevated D-dimer greater than 9.  CT angio chest was negative for pulmonary embolism however it showed multiple bilateral lower lobe predominant patchy lung opacities concerning for pneumonia right worse than left.  Differential also includes venoocclusive disease in the patient with sickle cell disease.  The patient was started on broad-spectrum antibiotics for CAP as well as IV fluid and opioid-based analgesics.  Admitted by [facility], hospitalist service.  ED Course: Tmax 98.4.  BP 132/72, pulse 128, respiratory 21, with saturation 100% on 6 L.  Lab studies significant for WBC 16.2, hemoglobin 8.8, MCV 73.5.   Review of Systems: Review of systems as noted in the HPI. All other systems reviewed and are negative.   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 He has never used smokeless tobacco. He reports that he does not drink alcohol and  does not use drugs.   Allergies  Allergen Reactions   Allergy Medication [Diphenhydramine] Swelling and Other (See Comments)    Face swells.     Family History  Problem Relation Age of Onset   Hypertension Mother    Hypertension Maternal Grandmother    Hypertension Maternal Grandfather    Asthma Paternal Grandfather    Diabetes Paternal Grandfather       Prior to Admission medications   Medication Sig Start Date End Date Taking? Authorizing Provider  oseltamivir (TAMIFLU) 75 MG capsule Take 1 capsule (75 mg total) by mouth every 12 (twelve) hours. 08/11/22  Yes Milton Ferguson, MD  ibuprofen (ADVIL) 200 MG tablet Take 4 tablets (800 mg total) by mouth every 6 (six) hours as needed (pain). 06/14/22   Elwyn Reach, MD  methocarbamol (ROBAXIN) 500 MG tablet Take 1 tablet (500 mg total) by mouth every 8 (eight) hours as needed for muscle spasms. 06/14/22   Elwyn Reach, MD  Oxycodone HCl 10 MG TABS Take 1 tablet (10 mg total) by mouth every 4 (four) hours as needed (pain). 08/11/22   Dorena Dew, FNP    Physical Exam: BP (!) 150/99   Pulse 99   Temp 97.6 F (36.4 C) (Oral)   Resp 19   Ht 5\' 5"  (1.651 m)   Wt 68 kg   SpO2 100%   BMI 24.96 kg/m   General: 21 y.o. year-old male well developed well nourished in no acute distress.  Alert and  oriented x3. Cardiovascular: Regular rate and rhythm with no rubs or gallops.  No thyromegaly or JVD noted.  No lower extremity edema. 2/4 pulses in all 4 extremities. Respiratory: Mild rales at bases.  No wheezing noted.  Poor inspiratory effort. Abdomen: Soft nontender nondistended with normal bowel sounds x4 quadrants. Muskuloskeletal: No cyanosis, clubbing or edema noted bilaterally Neuro: CN II-XII intact, strength, sensation, reflexes Skin: No ulcerative lesions noted or rashes Psychiatry: Judgement and insight appear normal. Mood is appropriate for condition and setting          Labs on Admission:  Basic Metabolic  Panel: Recent Labs  Lab 08/11/22 0930 08/11/22 2119  NA 138 139  K 3.6 3.8  CL 105 107  CO2 25 23  GLUCOSE 109* 131*  BUN 7 10  CREATININE 0.72 0.72  CALCIUM 8.9 9.0   Liver Function Tests: Recent Labs  Lab 08/11/22 0930 08/11/22 2119  AST 40 56*  ALT 12 11  ALKPHOS 91 94  BILITOT 2.0* 2.0*  PROT 8.3* 7.8  ALBUMIN 4.1 3.6   No results for input(s): "LIPASE", "AMYLASE" in the last 168 hours. No results for input(s): "AMMONIA" in the last 168 hours. CBC: Recent Labs  Lab 08/11/22 0930 08/11/22 2119  WBC 11.6* 16.2*  NEUTROABS 6.7 13.9*  HGB 9.9* 8.8*  HCT 30.0* 26.1*  MCV 75.2* 73.5*  PLT 257 259   Cardiac Enzymes: No results for input(s): "CKTOTAL", "CKMB", "CKMBINDEX", "TROPONINI" in the last 168 hours.  BNP (last 3 results) No results for input(s): "BNP" in the last 8760 hours.  ProBNP (last 3 results) No results for input(s): "PROBNP" in the last 8760 hours.  CBG: No results for input(s): "GLUCAP" in the last 168 hours.  Radiological Exams on Admission: CT Angio Chest PE W and/or Wo Contrast  Result Date: 08/11/2022 CLINICAL DATA:  Cough since Thursday. Seen at sickle cell clinic today. EXAM: CT ANGIOGRAPHY CHEST WITH CONTRAST TECHNIQUE: Multidetector CT imaging of the chest was performed using the standard protocol during bolus administration of intravenous contrast. Multiplanar CT image reconstructions and MIPs were obtained to evaluate the vascular anatomy. RADIATION DOSE REDUCTION: This exam was performed according to the departmental dose-optimization program which includes automated exposure control, adjustment of the mA and/or kV according to patient size and/or use of iterative reconstruction technique. CONTRAST:  41mL OMNIPAQUE IOHEXOL 350 MG/ML SOLN COMPARISON:  None Available. FINDINGS: Cardiovascular: No evidence of pulmonary embolism. Evaluation of distal segmental and subsegmental pulmonary arteries is somewhat limited due to respiratory motion.  Mediastinum/Nodes: No enlarged mediastinal, hilar, or axillary lymph nodes. Thyroid gland, trachea, and esophagus demonstrate no significant findings. Lungs/Pleura: Multiple bilateral lower lobe predominant patchy lung opacities concerning for pneumonia, right worse than the left. Differential also includes veno-occlusive disease in this patient with sickle cell disease. No pleural effusion or pneumothorax. Upper Abdomen: No acute abnormality. 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 Musculoskeletal: Generalized sclerosis and heterogeneous marrow signal of the thoracic spine with Lincoln log appearance of the vertebral bodies consistent with sickle cell disease. Review of the MIP images confirms the above findings. IMPRESSION: 1. No evidence of pulmonary embolism. Evaluation of distal segmental and subsegmental pulmonary arteries is somewhat limited due to respiratory motion. 2. Multiple bilateral lower lobe predominant patchy lung opacities concerning for pneumonia, right worse than left. Differential also includes veno-occlusive disease in this patient with sickle cell disease. 3. Generalized sclerosis and heterogeneous marrow signal of the thoracic spine with Lincoln log appearance of the vertebral  bodies consistent with sickle cell disease. 4. 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 Electronically Signed   By: Larose Hires D.O.   On: 08/11/2022 23:44   DG Chest 2 View  Result Date: 08/11/2022 CLINICAL DATA:  Shortness of breath, sickle cell crisis, nausea, vomiting, and diarrhea. EXAM: CHEST - 2 VIEW COMPARISON:  06/11/2022. FINDINGS: The heart is enlarged and the mediastinal contour stable. The pulmonary vasculature is distended. Mild airspace disease is noted in the perihilar region on the right. No effusion or pneumothorax. No acute osseous abnormality. IMPRESSION: 1. Cardiomegaly with mildly distended pulmonary vasculature. 2. Mild  airspace disease in the perihilar region on the right, possible edema or developing pneumonia. Electronically Signed   By: Thornell Sartorius M.D.   On: 08/11/2022 21:49    EKG: I independently viewed the EKG done and my findings are as followed: Sinus rhythm rate of 99.  Nonspecific ST-T changes.  QTc 438.  Assessment/Plan Present on Admission:  Sickle cell pain crisis (HCC)  Active Problems:   Sickle cell pain crisis (HCC)  Sickle cell pain crisis, possibly triggered by pneumonia. Pain control and bowel regimen PCA pump Dilaudid IV antiemetics as needed IV fluid hydration half-normal saline at 75 cc/h x 2 days  Acute hypoxic respiratory failure secondary to CAP versus asthma exacerbation POA Not on oxygen supplementation at baseline Currently on 6 L to maintain a saturation greater than 92% Wean off oxygen supplementation as tolerated Bronchodilators Incentive spirometer Closely monitor on telemetry  Sepsis secondary to community-acquired pneumonia, seen on CT scan, POA Obtain baseline procalcitonin Started on Rocephin and azithromycin, continue Monitor fever curve and WBC  Elevated D-dimer D-dimer greater than 9 CTA negative for pulmonary embolism    DVT prophylaxis: Subcu Lovenox daily.  Code Status: Full code.  Family Communication: None at bedside.  Disposition Plan: Admitted to telemetry unit  Consults called: None    Admission status: Inpatient status.   Status is: Inpatient The patient requires at least 2 midnights for further evaluation and treatment of present condition.   Darlin Drop MD [facility] Pager [phone]  If 7PM-7AM, please contact night-coverage [email or web address] Password [email or web address]  08/12/2022, 5:46 AM

## 2022-08-12 NOTE — ED Notes (Signed)
Pt refused vitals due to pain at this time.

## 2022-08-12 NOTE — Telephone Encounter (Signed)
Pt's mother Tye [place] called the day hospital stating that she had to bring pt to the ED overnight due to unmanaged pain crisis and NV, unable to keep home medications down overnight. States that pt is admitted to room 19 at [facility] ED, has been treated  but is still in a lot of pain. Mother of pt wants to know if a provider can round on him and address his pain management. Instructed mother and pt to remain in the ED since pt is admitted and provider at the day hospital will be notified.  [place] FNP notified of pt and mother's concerns, verbalized understanding.

## 2022-08-12 NOTE — ED Notes (Signed)
Pt found to be detached from wall oxygen. Spo2 was at 73%. Pt attached to wall oxygen at 6L [place] pt spo2 returned to 96%

## 2022-08-13 DIAGNOSIS — D5700 Hb-SS disease with crisis, unspecified: Secondary | ICD-10-CM | POA: Diagnosis not present

## 2022-08-13 LAB — CBC
HCT: 21.6 % — ABNORMAL LOW (ref 39.0–52.0)
Hemoglobin: 7.3 g/dL — ABNORMAL LOW (ref 13.0–17.0)
MCH: 24.7 pg — ABNORMAL LOW (ref 26.0–34.0)
MCHC: 33.8 g/dL (ref 30.0–36.0)
MCV: 73 fL — ABNORMAL LOW (ref 80.0–100.0)
Platelets: 185 10*3/uL (ref 150–400)
RBC: 2.96 MIL/uL — ABNORMAL LOW (ref 4.22–5.81)
RDW: 26.7 % — ABNORMAL HIGH (ref 11.5–15.5)
WBC: 17 10*3/uL — ABNORMAL HIGH (ref 4.0–10.5)
nRBC: 46.1 % — ABNORMAL HIGH (ref 0.0–0.2)

## 2022-08-13 LAB — MRSA NEXT GEN BY PCR, NASAL: MRSA by PCR Next Gen: NOT DETECTED

## 2022-08-13 LAB — HEMOGLOBIN AND HEMATOCRIT, BLOOD
HCT: 26.3 % — ABNORMAL LOW (ref 39.0–52.0)
Hemoglobin: 8.9 g/dL — ABNORMAL LOW (ref 13.0–17.0)

## 2022-08-13 LAB — PREPARE RBC (CROSSMATCH)

## 2022-08-13 LAB — LACTATE DEHYDROGENASE: LDH: 1558 U/L — ABNORMAL HIGH (ref 98–192)

## 2022-08-13 MED ORDER — GUAIFENESIN-DM 100-10 MG/5ML PO SYRP
5.0000 mL | ORAL_SOLUTION | ORAL | Status: DC | PRN
  Administered 2022-08-13 – 2022-08-18 (×6): 5 mL via ORAL
  Filled 2022-08-13 (×7): qty 10

## 2022-08-13 MED ORDER — ORAL CARE MOUTH RINSE: 15.0000 mL | OROMUCOSAL | Status: DC | PRN

## 2022-08-13 MED ORDER — SODIUM CHLORIDE 0.9% IV SOLUTION: Freq: Once | INTRAVENOUS | Status: AC

## 2022-08-13 MED ORDER — ACETAMINOPHEN 325 MG PO TABS
650.0000 mg | ORAL_TABLET | Freq: Once | ORAL | Status: AC
  Administered 2022-08-13: 650 mg via ORAL
  Filled 2022-08-13: qty 2

## 2022-08-13 MED ORDER — IPRATROPIUM-ALBUTEROL 0.5-2.5 (3) MG/3ML IN SOLN
3.0000 mL | Freq: Two times a day (BID) | RESPIRATORY_TRACT | Status: DC
  Administered 2022-08-13 – 2022-08-18 (×11): 3 mL via RESPIRATORY_TRACT
  Filled 2022-08-13 (×11): qty 3

## 2022-08-13 MED ORDER — CHLORHEXIDINE GLUCONATE CLOTH 2 % EX PADS
6.0000 | MEDICATED_PAD | Freq: Every day | CUTANEOUS | Status: DC
  Administered 2022-08-14 – 2022-08-15 (×2): 6 via TOPICAL

## 2022-08-13 NOTE — Progress Notes (Signed)
Subjective: Jonathan [name] is a 21 year old male with a medical history significant for sickle cell disease, chronic pain syndrome, opiate dependence and tolerance, and history of asthma was admitted for influenza A and acute chest in the setting of sickle cell pain crisis. Today, patient's blood pressure remains markedly elevated and heart rate ranging 120s-140s.  Patient endorses shortness of breath with exertion.  He is maintaining oxygen saturation above 90% on 4 L supplemental oxygen.  His pain intensity is 8/10.  He says that he is hurting all over.  Overnight, patient's hemoglobin decreased to 7.4 g/dL. Patient denies headache, dizziness, urinary symptoms, nausea, vomiting, or diarrhea.  Objective:  Vital signs in last 24 hours:  Vitals:   08/13/22 1125 08/13/22 1132 08/13/22 1151 08/13/22 1200  BP: (!) 148/102  (!) 160/102   Pulse: (!) 140  (!) 138   Resp: 20 20 (!) 22   Temp: 98.2 F (36.8 C)  99.2 F (37.3 C) 98.4 F (36.9 C)  TempSrc: Oral   Oral  SpO2: 94% 94%    Weight:      Height:        Intake/Output from previous day:   Intake/Output Summary (Last 24 hours) at 08/13/2022 1229 Last data filed at 08/13/2022 1133 Gross per 24 hour  Intake 240 ml  Output 1725 ml  Net -1485 ml    Physical Exam: General: Alert, awake, oriented x3, in no acute distress.  Scleral icterus HEENT: [place]/AT PEERL, EOMI Neck: Trachea midline,  no masses, no thyromegal,y no JVD, no carotid bruit OROPHARYNX:  Moist, No exudate/ erythema/lesions.  Heart: Tachycardia, no murmurs, rubs, or gallops Lungs: Clear to auscultation, no wheezing or rhonchi noted. No increased vocal fremitus resonant to percussion  Abdomen: Soft, nontender, nondistended, positive bowel sounds, no masses no hepatosplenomegaly noted..  Neuro: No focal neurological deficits noted cranial nerves II through XII grossly intact. DTRs 2+ bilaterally upper and lower extremities. Strength 5 out of 5 in bilateral upper and  lower extremities. Musculoskeletal: No warm swelling or erythema around joints, no spinal tenderness noted. Psychiatric: Patient alert and oriented x3, good insight and cognition, good recent to remote recall. Lymph node survey: No cervical axillary or inguinal lymphadenopathy noted.  Lab Results:  Basic Metabolic Panel:    Component Value Date/Time   NA 136 08/12/2022 0600   NA 139 08/06/2021 1030   K 4.5 08/12/2022 0600   CL 103 08/12/2022 0600   CO2 25 08/12/2022 0600   BUN 11 08/12/2022 0600   BUN 9 08/06/2021 1030   CREATININE 0.75 08/12/2022 0600   CREATININE 0.70 05/12/2016 1052   GLUCOSE 141 (H) 08/12/2022 0600   CALCIUM 8.6 (L) 08/12/2022 0600   CBC:    Component Value Date/Time   WBC 17.0 (H) 08/13/2022 0742   HGB 7.3 (L) 08/13/2022 0742   HGB 7.6 (L) 08/06/2021 1030   HCT 21.6 (L) 08/13/2022 0742   HCT 23.6 (L) 08/06/2021 1030   PLT 185 08/13/2022 0742   PLT 958 (HH) 08/06/2021 1030   MCV 73.0 (L) 08/13/2022 0742   MCV 96 08/06/2021 1030   NEUTROABS 10.4 (H) 08/12/2022 1659   NEUTROABS 3.4 08/06/2021 1030   LYMPHSABS 3.8 08/12/2022 1659   LYMPHSABS 2.4 08/06/2021 1030   MONOABS 1.3 (H) 08/12/2022 1659   EOSABS 0.0 08/12/2022 1659   EOSABS 0.2 08/06/2021 1030   BASOSABS 0.1 08/12/2022 1659   BASOSABS 0.1 08/06/2021 1030    Recent Results (from the past 240 hour(s))  Resp panel  by RT-PCR (RSV, Flu A&B, Covid) Anterior Nasal Swab     Status: Abnormal   Collection Time: 08/11/22  8:35 PM   Specimen: Anterior Nasal Swab  Result Value Ref Range Status   SARS Coronavirus 2 by RT PCR NEGATIVE NEGATIVE Final    Comment: (NOTE) SARS-CoV-2 target nucleic acids are NOT DETECTED.  The SARS-CoV-2 RNA is generally detectable in upper respiratory specimens during the acute phase of infection. The lowest concentration of SARS-CoV-2 viral copies this assay can detect is 138 copies/mL. A negative result does not preclude SARS-Cov-2 infection and should not be used as  the sole basis for treatment or other patient management decisions. A negative result may occur with  improper specimen collection/handling, submission of specimen other than nasopharyngeal swab, presence of viral mutation(s) within the areas targeted by this assay, and inadequate number of viral copies(<138 copies/mL). A negative result must be combined with clinical observations, patient history, and epidemiological information. The expected result is Negative.  Fact Sheet for Patients:  BloggerCourse.com  Fact Sheet for Healthcare Providers:  SeriousBroker.it  This test is no t yet approved or cleared by the [place] FDA and  has been authorized for detection and/or diagnosis of SARS-CoV-2 by FDA under an Emergency Use Authorization (EUA). This EUA will remain  in effect (meaning this test can be used) for the duration of the COVID-19 declaration under Section 564(b)(1) of the Act, 21 U.S.C.section 360bbb-3(b)(1), unless the authorization is terminated  or revoked sooner.       Influenza A by PCR POSITIVE (A) NEGATIVE Final   Influenza B by PCR NEGATIVE NEGATIVE Final    Comment: (NOTE) The Xpert Xpress SARS-CoV-2/FLU/RSV plus assay is intended as an aid in the diagnosis of influenza from Nasopharyngeal swab specimens and should not be used as a sole basis for treatment. Nasal washings and aspirates are unacceptable for Xpert Xpress SARS-CoV-2/FLU/RSV testing.  Fact Sheet for Patients: BloggerCourse.com  Fact Sheet for Healthcare Providers: SeriousBroker.it  This test is not yet approved or cleared by the [place] FDA and has been authorized for detection and/or diagnosis of SARS-CoV-2 by FDA under an Emergency Use Authorization (EUA). This EUA will remain in effect (meaning this test can be used) for the duration of the COVID-19 declaration under Section  564(b)(1) of the Act, 21 U.S.C. section 360bbb-3(b)(1), unless the authorization is terminated or revoked.     Resp Syncytial Virus by PCR NEGATIVE NEGATIVE Final    Comment: (NOTE) Fact Sheet for Patients: BloggerCourse.com  Fact Sheet for Healthcare Providers: SeriousBroker.it  This test is not yet approved or cleared by the [place] FDA and has been authorized for detection and/or diagnosis of SARS-CoV-2 by FDA under an Emergency Use Authorization (EUA). This EUA will remain in effect (meaning this test can be used) for the duration of the COVID-19 declaration under Section 564(b)(1) of the Act, 21 U.S.C. section 360bbb-3(b)(1), unless the authorization is terminated or revoked.  Performed at [facility], [address], [place], [place] [address]     Studies/Results: CT Angio Chest PE W and/or Wo Contrast  Result Date: 08/11/2022 CLINICAL DATA:  Cough since Thursday. Seen at sickle cell clinic today. EXAM: CT ANGIOGRAPHY CHEST WITH CONTRAST TECHNIQUE: Multidetector CT imaging of the chest was performed using the standard protocol during bolus administration of intravenous contrast. Multiplanar CT image reconstructions and MIPs were obtained to evaluate the vascular anatomy. RADIATION DOSE REDUCTION: This exam was performed according to the departmental dose-optimization program which includes  automated exposure control, adjustment of the mA and/or kV according to patient size and/or use of iterative reconstruction technique. CONTRAST:  74mL OMNIPAQUE IOHEXOL 350 MG/ML SOLN COMPARISON:  None Available. FINDINGS: Cardiovascular: No evidence of pulmonary embolism. Evaluation of distal segmental and subsegmental pulmonary arteries is somewhat limited due to respiratory motion. Mediastinum/Nodes: No enlarged mediastinal, hilar, or axillary lymph nodes. Thyroid gland, trachea, and esophagus demonstrate no  significant findings. Lungs/Pleura: Multiple bilateral lower lobe predominant patchy lung opacities concerning for pneumonia, right worse than the left. Differential also includes veno-occlusive disease in this patient with sickle cell disease. No pleural effusion or pneumothorax. Upper Abdomen: No acute abnormality. 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 Musculoskeletal: Generalized sclerosis and heterogeneous marrow signal of the thoracic spine with Lincoln log appearance of the vertebral bodies consistent with sickle cell disease. Review of the MIP images confirms the above findings. IMPRESSION: 1. No evidence of pulmonary embolism. Evaluation of distal segmental and subsegmental pulmonary arteries is somewhat limited due to respiratory motion. 2. Multiple bilateral lower lobe predominant patchy lung opacities concerning for pneumonia, right worse than left. Differential also includes veno-occlusive disease in this patient with sickle cell disease. 3. Generalized sclerosis and heterogeneous marrow signal of the thoracic spine with Lincoln log appearance of the vertebral bodies consistent with sickle cell disease. 4. 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 Electronically Signed   By: Keane Police D.O.   On: 08/11/2022 23:44   DG Chest 2 View  Result Date: 08/11/2022 CLINICAL DATA:  Shortness of breath, sickle cell crisis, nausea, vomiting, and diarrhea. EXAM: CHEST - 2 VIEW COMPARISON:  06/11/2022. FINDINGS: The heart is enlarged and the mediastinal contour stable. The pulmonary vasculature is distended. Mild airspace disease is noted in the perihilar region on the right. No effusion or pneumothorax. No acute osseous abnormality. IMPRESSION: 1. Cardiomegaly with mildly distended pulmonary vasculature. 2. Mild airspace disease in the perihilar region on the right, possible edema or developing pneumonia. Electronically Signed   By: Brett Fairy  M.D.   On: 08/11/2022 21:49    Medications: Scheduled Meds:  enoxaparin (LOVENOX) injection  40 mg Subcutaneous Q24H   HYDROmorphone   Intravenous Q4H   ipratropium-albuterol  3 mL Nebulization TID   ketorolac  15 mg Intravenous Q6H   ondansetron  4 mg Oral Once   oseltamivir  75 mg Oral BID   senna-docusate  1 tablet Oral QHS   Continuous Infusions:  sodium chloride 75 mL/hr at 08/12/22 2246   PRN Meds:.acetaminophen, guaiFENesin-dextromethorphan, naloxone **AND** sodium chloride flush, ondansetron (ZOFRAN) IV, polyethylene glycol  Consultants: none  Procedures: none  Antibiotics: none  Assessment/Plan: Active Problems:   Sickle cell pain crisis ([place])  Acute chest syndrome: Simple exchange transfusion.  Removed 500 cc and transfuse 2 units PRBCs. Repeat LDH and CBC in a.m. Maintain oxygen saturation above 90%.  Patient is currently maintaining on 4 L, which is improved slightly from 6 L. Will transfer patient to stepdown for higher level of care  Influenza A: Patient positive for influenza A.  Tamiflu 75 mg every 12 hours for total of 5 days. Continue supplemental oxygen Tylenol 650 mg every 4 hours as needed for fever  Sickle cell disease with pain crisis: Continue IV Dilaudid PCA without any changes in settings Toradol 15 mg IV every 6 hours for total of 5 days Monitor vital signs very closely, reevaluate pain scale regularly, and continue supplemental oxygen  Anemia of chronic disease: Patient's hemoglobin  is 7.3 g/dL, which is below patient's baseline.  Baseline is around 8-9 g/dL. Simple exchange transfusion today as discussed above. LDH markedly elevated, trending up  Leukocytosis:   WBCs elevated at 17.  Influenza A positive.  CT shows infiltrates.  Will continue Tamiflu.  No further antibiotics at this time.  Will consider going forward.  Asthma: Continue nebulizer every 6 hours as needed for shortness of breath, wheezing, and or persistent  cough  Code Status: Full Code Family Communication: N/A Disposition Plan: Not yet ready for discharge  [place], MSN, FNP-C Patient [place], [place] [address] [phone]  If 7PM-7AM, please contact night-coverage.  08/13/2022, 12:29 PM  LOS: 1 day

## 2022-08-13 NOTE — Progress Notes (Signed)
   08/13/22 1021  Assess: MEWS Score  Temp 98.3 F (36.8 C)  BP (!) 153/101  MAP (mmHg) 116  Pulse Rate (!) 125  Resp 18  SpO2 92 %  O2 Device Nasal Cannula  O2 Flow Rate (L/min) 4 L/min  Assess: MEWS Score  MEWS Temp 0  MEWS Systolic 0  MEWS Pulse 2  MEWS RR 0  MEWS LOC 0  MEWS Score 2  MEWS Score Color Yellow  Assess: if the MEWS score is Yellow or Red  Were vital signs taken at a resting state? Yes  Focused Assessment No change from prior assessment  Does the patient meet 2 or more of the SIRS criteria? No  MEWS guidelines implemented *See Row Information* Yes  Take Vital Signs  Increase Vital Sign Frequency  Yellow: Q 2hr X 2 then Q 4hr X 2, if remains yellow, continue Q 4hrs  Escalate  MEWS: Escalate Yellow: discuss with charge nurse/RN and consider discussing with provider and RRT  Notify: Charge Nurse/RN  Name of Charge Nurse/RN Notified [place] RN  Date Charge Nurse/RN Notified 08/13/22  Time Charge Nurse/RN Notified 1021  Provider Notification  Provider Name/Title Julianne Handler  Date Provider Notified 08/13/22  Time Provider Notified 1042  Method of Notification Page  Notification Reason Other (Comment) (elevated HR, BP)  Provider response Other (Comment) (MD aware)  Date of Provider Response 08/13/22  Time of Provider Response 1047  Document  Patient Outcome Other (Comment) (blood being administered)  Progress note created (see row info) Yes  Assess: SIRS CRITERIA  SIRS Temperature  0  SIRS Pulse 1  SIRS Respirations  0  SIRS WBC 0  SIRS Score Sum  1

## 2022-08-13 NOTE — Progress Notes (Signed)
IV consult for therapeutic phlebotomy. Started a PIV on RFA 20G x 1.88" and pulled 500 cc of blood. Flushed PIV well. Patient is hemodynamically stable. No c/o of discomfort at this time. RN at bedside.

## 2022-08-13 NOTE — Progress Notes (Signed)
   08/13/22 1151  Assess: MEWS Score  Temp 99.2 F (37.3 C)  BP (!) 160/102  Pulse Rate (!) 138  Resp (!) 22  Assess: MEWS Score  MEWS Temp 0  MEWS Systolic 0  MEWS Pulse 3  MEWS RR 1  MEWS LOC 0  MEWS Score 4  MEWS Score Color Red  Assess: if the MEWS score is Yellow or Red  Were vital signs taken at a resting state? Yes  Focused Assessment No change from prior assessment  Does the patient meet 2 or more of the SIRS criteria? Yes  Does the patient have a confirmed or suspected source of infection? No  MEWS guidelines implemented *See Row Information* Yes  Take Vital Signs  Increase Vital Sign Frequency  Red: Q 1hr X 4 then Q 4hr X 4, if remains red, continue Q 4hrs  Escalate  MEWS: Escalate Red: discuss with charge nurse/RN and provider, consider discussing with RRT  Notify: Charge Nurse/RN  Name of Charge Nurse/RN Notified [place] RN  Date Charge Nurse/RN Notified 08/13/22  Time Charge Nurse/RN Notified 1159  Provider Notification  Provider Name/Title Julianne Handler  Date Provider Notified 08/13/22  Time Provider Notified 1211  Method of Notification Face-to-face  Notification Reason Change in status  Provider response At bedside  Date of Provider Response 08/13/22  Time of Provider Response 1211  Document  Patient Outcome Other (Comment) (currently recieiving blood, telemetry now on)  Progress note created (see row info) Yes  Assess: SIRS CRITERIA  SIRS Temperature  0  SIRS Pulse 1  SIRS Respirations  1  SIRS WBC 1  SIRS Score Sum  3

## 2022-08-14 ENCOUNTER — Encounter (HOSPITAL_COMMUNITY): Payer: Commercial Managed Care - PPO

## 2022-08-14 DIAGNOSIS — D5700 Hb-SS disease with crisis, unspecified: Secondary | ICD-10-CM | POA: Diagnosis not present

## 2022-08-14 LAB — CBC
HCT: 25.9 % — ABNORMAL LOW (ref 39.0–52.0)
Hemoglobin: 8.6 g/dL — ABNORMAL LOW (ref 13.0–17.0)
MCH: 25.6 pg — ABNORMAL LOW (ref 26.0–34.0)
MCHC: 33.2 g/dL (ref 30.0–36.0)
MCV: 77.1 fL — ABNORMAL LOW (ref 80.0–100.0)
Platelets: 164 K/uL (ref 150–400)
RBC: 3.36 MIL/uL — ABNORMAL LOW (ref 4.22–5.81)
RDW: 24.7 % — ABNORMAL HIGH (ref 11.5–15.5)
WBC: 15.3 K/uL — ABNORMAL HIGH (ref 4.0–10.5)
nRBC: 44.4 % — ABNORMAL HIGH (ref 0.0–0.2)

## 2022-08-14 LAB — HEPATIC FUNCTION PANEL
ALT: 11 U/L (ref 0–44)
AST: 52 U/L — ABNORMAL HIGH (ref 15–41)
Albumin: 2.8 g/dL — ABNORMAL LOW (ref 3.5–5.0)
Alkaline Phosphatase: 268 U/L — ABNORMAL HIGH (ref 38–126)
Bilirubin, Direct: 3.8 mg/dL — ABNORMAL HIGH (ref 0.0–0.2)
Indirect Bilirubin: 2.2 mg/dL — ABNORMAL HIGH (ref 0.3–0.9)
Total Bilirubin: 6 mg/dL — ABNORMAL HIGH (ref 0.3–1.2)
Total Protein: 6.5 g/dL (ref 6.5–8.1)

## 2022-08-14 LAB — BASIC METABOLIC PANEL
Anion gap: 9 (ref 5–15)
BUN: 9 mg/dL (ref 6–20)
CO2: 28 mmol/L (ref 22–32)
Calcium: 8.6 mg/dL — ABNORMAL LOW (ref 8.9–10.3)
Chloride: 98 mmol/L (ref 98–111)
Creatinine, Ser: 0.54 mg/dL — ABNORMAL LOW (ref 0.61–1.24)
GFR, Estimated: >60 mL/min (ref >60)
Glucose, Bld: 112 mg/dL — ABNORMAL HIGH (ref 70–99)
Potassium: 4.3 mmol/L (ref 3.5–5.1)
Sodium: 135 mmol/L (ref 135–145)

## 2022-08-14 LAB — LACTATE DEHYDROGENASE: LDH: 1435 U/L — ABNORMAL HIGH (ref 98–192)

## 2022-08-14 LAB — RETICULOCYTES
Immature Retic Fract: 18.6 % — ABNORMAL HIGH (ref 2.3–15.9)
RBC.: 3.34 MIL/uL — ABNORMAL LOW (ref 4.22–5.81)
Retic Count, Absolute: 217.8 10*3/uL — ABNORMAL HIGH (ref 19.0–186.0)
Retic Ct Pct: 6.5 % — ABNORMAL HIGH (ref 0.4–3.1)

## 2022-08-14 MED ORDER — ENSURE ENLIVE PO LIQD
237.0000 mL | Freq: Three times a day (TID) | ORAL | Status: DC
  Administered 2022-08-14 – 2022-08-18 (×11): 237 mL via ORAL

## 2022-08-14 MED ORDER — SODIUM CHLORIDE 0.9 % IV SOLN
1.0000 g | INTRAVENOUS | Status: DC
  Administered 2022-08-14 – 2022-08-18 (×5): 1 g via INTRAVENOUS
  Filled 2022-08-14 (×5): qty 10

## 2022-08-14 MED ORDER — ADULT MULTIVITAMIN W/MINERALS CH
1.0000 | ORAL_TABLET | Freq: Every day | ORAL | Status: DC
  Administered 2022-08-14 – 2022-08-19 (×6): 1 via ORAL
  Filled 2022-08-14 (×6): qty 1

## 2022-08-14 MED ORDER — SODIUM CHLORIDE 0.9 % IV SOLN
500.0000 mg | INTRAVENOUS | Status: AC
  Administered 2022-08-14 – 2022-08-16 (×3): 500 mg via INTRAVENOUS
  Filled 2022-08-14 (×3): qty 5

## 2022-08-14 MED ORDER — SODIUM CHLORIDE 0.45 % IV SOLN: INTRAVENOUS | Status: AC

## 2022-08-14 NOTE — Progress Notes (Signed)
  Transition of Care (TOC) Screening Note   Patient Details  Name: Jonathan [name] Date of Birth: 06-Mar-2001   [facility]) CM/SW Contact:    Lavenia Atlas, RN Phone Number: 08/14/2022, 3:27 PM    Transition of Care Department [facility]) has reviewed patient and no TOC needs have been identified at this time. We will continue to monitor patient advancement through interdisciplinary progression rounds. If new patient transition needs arise, please place a TOC consult.

## 2022-08-14 NOTE — Progress Notes (Signed)
Initial Nutrition Assessment  DOCUMENTATION CODES:   Not applicable  INTERVENTION:  Encourage adequate PO intake Ensure Enlive po TID, each supplement provides 350 kcal and 20 grams of protein (pt prefers strawberry) MVI with minerals daily  NUTRITION DIAGNOSIS:   Increased nutrient needs related to acute illness as evidenced by estimated needs.  GOAL:   Patient will meet greater than or equal to 90% of their needs  MONITOR:   PO intake, Supplement acceptance, Weight trends, Labs  REASON FOR ASSESSMENT:   Malnutrition Screening Tool    ASSESSMENT:   Pt admitted d/t increased pain secondary to sickle cell pain crisis as well as influenza A and acute chest syndrome. PMH significant for sickle cell disease, chronic pain syndrome, asthma, anemia of chronic disease.  Pt reports that his appetite has been poor for about 1 week since exacerbation of illness. He states that he has been eating at least 50% less than his typical intake. Breakfast this morning was the biggest meal he has eaten all week which consisted of only fruit. He reports feeling dehydrated d/t poor PO intake and has been trying to drink Powerade at home for the electrolytes. He is agreeable to nutrition supplements to increase his nutritional adequacy while his appetite is poor. Discussed good options for protein intake with each meal throughout the day.   Meal completions: 12/21: 100% breakfast  Pt reports that his usual weight is about 155 lbs and suspects that he has lost a significant amount of weight this week. Pt's weight on 09/13 was noted to be 69 kg. His current admit weight is 65.3 kg. This is a 5.3% weight loss which is not clinically significant for time frame.   Medications: MVI, senna, IV abx, IV NaCl _0 /hr  Labs: Cr 0.54, alk phos 268, AST 52  NUTRITION - FOCUSED PHYSICAL EXAM:  Flowsheet Row Most Recent Value  Orbital Region No depletion  Upper Arm Region No depletion  Thoracic and Lumbar  Region No depletion  Buccal Region No depletion  Temple Region No depletion  Clavicle Bone Region Mild depletion  Clavicle and Acromion Bone Region No depletion  Scapular Bone Region No depletion  Dorsal Hand No depletion  Patellar Region No depletion  Anterior Thigh Region No depletion  Posterior Calf Region No depletion  Edema (RD Assessment) None  Hair Reviewed  Eyes Reviewed  Mouth Reviewed  Skin Reviewed  Nails Reviewed      Diet Order:   Diet Order             Diet regular Room service appropriate? Yes; Fluid consistency: Thin  Diet effective now                   EDUCATION NEEDS:   Education needs have been addressed  Skin:  Skin Assessment: Reviewed RN Assessment  Last BM:  12/17  Height:   Ht Readings from Last 1 Encounters:  08/13/22 _1  (1.651 m)    Weight:   Wt Readings from Last 1 Encounters:  08/13/22 65.3 kg   BMI:  Body mass index is 23.96 kg/m.  Estimated Nutritional Needs:   Kcal:  1800-2000  Protein:  90-105g  Fluid:  >/=1.8L  Clayborne Dana, RDN, LDN Clinical Nutrition

## 2022-08-14 NOTE — Progress Notes (Signed)
Subjective: Jonathan [name] is a 21 year old male with a medical history significant for sickle cell disease, chronic pain syndrome, opiate dependence and tolerance, and history of asthma was admitted for influenza A and acute chest in the setting of sickle cell pain crisis. Today, patient states that pain intensity is somewhat improved.  He continues to endorse pain to chest, back, and bilateral lower extremities.  Patient rates his pain at 7/10. Patient continues to have an oxygen requirement of 4 L/min, oxygen was weaned from 6 on yesterday.  Patient is status post simple exchange transfusion on yesterday.  500 cc were removed and he was transfused 2 units.  Today, patient's hemoglobin is 8.6 g/dL.  Patient endorses shortness of breath with exertion.  Tachycardia continues 120s-140s. Patient denies headache, dizziness, urinary symptoms, nausea, vomiting, or diarrhea.  Objective:  Vital signs in last 24 hours:  Vitals:   08/14/22 0608 08/14/22 0622 08/14/22 0800 08/14/22 0834  BP: (!) 167/88  (!) 144/85   Pulse: (!) 117  (!) 129   Resp: 18  (!) 22 (!) 21  Temp:   98.7 F (37.1 C)   TempSrc:   Oral   SpO2: 95% 92% 100% 100%  Weight:      Height:        Intake/Output from previous day:   Intake/Output Summary (Last 24 hours) at 08/14/2022 1000 Last data filed at 08/14/2022 0834 Gross per 24 hour  Intake 2935.29 ml  Output 3300 ml  Net -364.71 ml    Physical Exam: General: Alert, awake, oriented x3, in no acute distress.  Scleral icterus HEENT: [place]/AT PEERL, EOMI Neck: Trachea midline,  no masses, no thyromegal,y no JVD, no carotid bruit OROPHARYNX:  Moist, No exudate/ erythema/lesions.  Heart: Tachycardia, no murmurs, rubs, or gallops Lungs: Clear to auscultation, no wheezing or rhonchi noted. No increased vocal fremitus resonant to percussion  Abdomen: Soft, nontender, nondistended, positive bowel sounds, no masses no hepatosplenomegaly noted..  Neuro: No focal neurological  deficits noted cranial nerves II through XII grossly intact. DTRs 2+ bilaterally upper and lower extremities. Strength 5 out of 5 in bilateral upper and lower extremities. Musculoskeletal: No warm swelling or erythema around joints, no spinal tenderness noted. Psychiatric: Patient alert and oriented x3, good insight and cognition, good recent to remote recall. Lymph node survey: No cervical axillary or inguinal lymphadenopathy noted.  Lab Results:  Basic Metabolic Panel:    Component Value Date/Time   NA 135 08/14/2022 0702   NA 139 08/06/2021 1030   K 4.3 08/14/2022 0702   CL 98 08/14/2022 0702   CO2 28 08/14/2022 0702   BUN 9 08/14/2022 0702   BUN 9 08/06/2021 1030   CREATININE 0.54 (L) 08/14/2022 0702   CREATININE 0.70 05/12/2016 1052   GLUCOSE 112 (H) 08/14/2022 0702   CALCIUM 8.6 (L) 08/14/2022 0702   CBC:    Component Value Date/Time   WBC 15.3 (H) 08/14/2022 0702   HGB 8.6 (L) 08/14/2022 0702   HGB 7.6 (L) 08/06/2021 1030   HCT 25.9 (L) 08/14/2022 0702   HCT 23.6 (L) 08/06/2021 1030   PLT 164 08/14/2022 0702   PLT 958 (HH) 08/06/2021 1030   MCV 77.1 (L) 08/14/2022 0702   MCV 96 08/06/2021 1030   NEUTROABS 10.4 (H) 08/12/2022 1659   NEUTROABS 3.4 08/06/2021 1030   LYMPHSABS 3.8 08/12/2022 1659   LYMPHSABS 2.4 08/06/2021 1030   MONOABS 1.3 (H) 08/12/2022 1659   EOSABS 0.0 08/12/2022 1659   EOSABS 0.2 08/06/2021  1030   BASOSABS 0.1 08/12/2022 1659   BASOSABS 0.1 08/06/2021 1030    Recent Results (from the past 240 hour(s))  Resp panel by RT-PCR (RSV, Flu A&B, Covid) Anterior Nasal Swab     Status: Abnormal   Collection Time: 08/11/22  8:35 PM   Specimen: Anterior Nasal Swab  Result Value Ref Range Status   SARS Coronavirus 2 by RT PCR NEGATIVE NEGATIVE Final    Comment: (NOTE) SARS-CoV-2 target nucleic acids are NOT DETECTED.  The SARS-CoV-2 RNA is generally detectable in upper respiratory specimens during the acute phase of infection. The  lowest concentration of SARS-CoV-2 viral copies this assay can detect is 138 copies/mL. A negative result does not preclude SARS-Cov-2 infection and should not be used as the sole basis for treatment or other patient management decisions. A negative result may occur with  improper specimen collection/handling, submission of specimen other than nasopharyngeal swab, presence of viral mutation(s) within the areas targeted by this assay, and inadequate number of viral copies(<138 copies/mL). A negative result must be combined with clinical observations, patient history, and epidemiological information. The expected result is Negative.  Fact Sheet for Patients:  BloggerCourse.com  Fact Sheet for Healthcare Providers:  SeriousBroker.it  This test is no t yet approved or cleared by the [place] FDA and  has been authorized for detection and/or diagnosis of SARS-CoV-2 by FDA under an Emergency Use Authorization (EUA). This EUA will remain  in effect (meaning this test can be used) for the duration of the COVID-19 declaration under Section 564(b)(1) of the Act, 21 U.S.C.section 360bbb-3(b)(1), unless the authorization is terminated  or revoked sooner.       Influenza A by PCR POSITIVE (A) NEGATIVE Final   Influenza B by PCR NEGATIVE NEGATIVE Final    Comment: (NOTE) The Xpert Xpress SARS-CoV-2/FLU/RSV plus assay is intended as an aid in the diagnosis of influenza from Nasopharyngeal swab specimens and should not be used as a sole basis for treatment. Nasal washings and aspirates are unacceptable for Xpert Xpress SARS-CoV-2/FLU/RSV testing.  Fact Sheet for Patients: BloggerCourse.com  Fact Sheet for Healthcare Providers: SeriousBroker.it  This test is not yet approved or cleared by the [place] FDA and has been authorized for detection and/or diagnosis of SARS-CoV-2 by FDA  under an Emergency Use Authorization (EUA). This EUA will remain in effect (meaning this test can be used) for the duration of the COVID-19 declaration under Section 564(b)(1) of the Act, 21 U.S.C. section 360bbb-3(b)(1), unless the authorization is terminated or revoked.     Resp Syncytial Virus by PCR NEGATIVE NEGATIVE Final    Comment: (NOTE) Fact Sheet for Patients: BloggerCourse.com  Fact Sheet for Healthcare Providers: SeriousBroker.it  This test is not yet approved or cleared by the [place] FDA and has been authorized for detection and/or diagnosis of SARS-CoV-2 by FDA under an Emergency Use Authorization (EUA). This EUA will remain in effect (meaning this test can be used) for the duration of the COVID-19 declaration under Section 564(b)(1) of the Act, 21 U.S.C. section 360bbb-3(b)(1), unless the authorization is terminated or revoked.  Performed at [facility], [address] [place], [place] [address]   MRSA Next Gen by PCR, Nasal     Status: None   Collection Time: 08/13/22  9:48 PM   Specimen: Nasal Swab  Result Value Ref Range Status   MRSA by PCR Next Gen NOT DETECTED NOT DETECTED Final    Comment: (NOTE) The GeneXpert MRSA Assay (FDA approved  for NASAL specimens only), is one component of a comprehensive MRSA colonization surveillance program. It is not intended to diagnose MRSA infection nor to guide or monitor treatment for MRSA infections. Test performance is not FDA approved in patients less than 35 years old. Performed at [name], [address], [place], [place] [address]     Studies/Results: No results found.  Medications: Scheduled Meds:  Chlorhexidine Gluconate Cloth  6 each Topical Daily   enoxaparin (LOVENOX) injection  40 mg Subcutaneous Q24H   HYDROmorphone   Intravenous Q4H   ipratropium-albuterol  3 mL Nebulization BID   ketorolac  15 mg  Intravenous Q6H   oseltamivir  75 mg Oral BID   senna-docusate  1 tablet Oral QHS   Continuous Infusions:  sodium chloride 75 mL/hr at 08/14/22 0952   PRN Meds:.acetaminophen, guaiFENesin-dextromethorphan, naloxone **AND** sodium chloride flush, ondansetron (ZOFRAN) IV, mouth rinse, polyethylene glycol  Consultants: none  Procedures: none  Antibiotics: Azithromycin Ceftriaxone  Assessment/Plan: Principal Problem:   Sickle cell pain crisis (HCC) Active Problems:   Sickle cell crisis acute chest syndrome (HCC)   Influenza A   Mild intermittent asthma without complication   Anemia of chronic disease   Chronic pain  Acute chest syndrome: Patient is status post simple exchange transfusion.  Hemoglobin 8.6 g/dL on today.  Will repeat LDH, CBC, and reticulocytes in AM.  Maintain oxygen saturation above 90%.  Patient is currently maintaining on 4 L, which is improved slightly from 6 L.   Influenza A: Patient positive for influenza A.  Tamiflu 75 mg every 12 hours for total of 5 days. Continue supplemental oxygen Tylenol 650 mg every 4 hours as needed for fever  Community-acquired pneumonia: Patient's CT showed no evidence of pulmonary embolism.  Multiple bilateral lower lobe predominant patchy lung opacities that were concerning for pneumonia, right worse than left.  Will initiate IV broad-spectrum antibiotics.  Azithromycin and ceftriaxone  Sickle cell disease with pain crisis: Continue IV Dilaudid PCA without any changes in settings Toradol 15 mg IV every 6 hours for total of 5 days Monitor vital signs very closely, reevaluate pain scale regularly, and continue supplemental oxygen  Anemia of chronic disease: Patient's hemoglobin is 8.6 g/dL, which is below patient's baseline.  Baseline is around 8-9 g/dL. Follow labs in AM  Leukocytosis:  Improving Influenza A positive.  CT shows infiltrates.  Will continue Tamiflu.  Initiate IV antibiotics.  Labs in AM    Asthma: Continue nebulizer every 6 hours as needed for shortness of breath, wheezing, and or persistent cough  Code Status: Full Code Family Communication: N/A Disposition Plan: Not yet ready for discharge  [name]  APRN, MSN, FNP-C [facility] [address] [place], [place] [address] [phone]  If 7PM-7AM, please contact night-coverage.  08/14/2022, 10:00 AM  LOS: 2 days

## 2022-08-15 DIAGNOSIS — D5700 Hb-SS disease with crisis, unspecified: Secondary | ICD-10-CM | POA: Diagnosis not present

## 2022-08-15 LAB — CBC
HCT: 24.1 % — ABNORMAL LOW (ref 39.0–52.0)
Hemoglobin: 7.9 g/dL — ABNORMAL LOW (ref 13.0–17.0)
MCH: 25.5 pg — ABNORMAL LOW (ref 26.0–34.0)
MCHC: 32.8 g/dL (ref 30.0–36.0)
MCV: 77.7 fL — ABNORMAL LOW (ref 80.0–100.0)
Platelets: 201 10*3/uL (ref 150–400)
RBC: 3.1 MIL/uL — ABNORMAL LOW (ref 4.22–5.81)
RDW: 25.2 % — ABNORMAL HIGH (ref 11.5–15.5)
WBC: 11.1 10*3/uL — ABNORMAL HIGH (ref 4.0–10.5)
nRBC: 84 % — ABNORMAL HIGH (ref 0.0–0.2)

## 2022-08-15 LAB — RETICULOCYTES
Immature Retic Fract: 9.6 % (ref 2.3–15.9)
RBC.: 3.11 MIL/uL — ABNORMAL LOW (ref 4.22–5.81)
Retic Count, Absolute: 114.5 10*3/uL (ref 19.0–186.0)
Retic Ct Pct: 3.9 % — ABNORMAL HIGH (ref 0.4–3.1)

## 2022-08-15 LAB — PREPARE RBC (CROSSMATCH)

## 2022-08-15 LAB — LACTATE DEHYDROGENASE: LDH: 1497 U/L — ABNORMAL HIGH (ref 98–192)

## 2022-08-15 MED ORDER — SODIUM CHLORIDE 0.9% IV SOLUTION: Freq: Once | INTRAVENOUS | Status: AC

## 2022-08-15 MED ORDER — ACETAMINOPHEN 325 MG PO TABS
650.0000 mg | ORAL_TABLET | Freq: Once | ORAL | Status: AC
  Administered 2022-08-15: 650 mg via ORAL
  Filled 2022-08-15: qty 2

## 2022-08-15 NOTE — Progress Notes (Signed)
During the night, patient had several episodes of tachycardia 150's and desaturation 86% when he stand or sit at bedside to void and coughing. As per RN day report to this RN, MD was aware.

## 2022-08-15 NOTE — Progress Notes (Signed)
Subjective: Jonathan [name] is a 21 year old male with a medical history significant for sickle cell disease, chronic pain syndrome, opiate dependence and tolerance, and history of asthma was admitted for influenza A and acute chest in the setting of sickle cell pain crisis. Patient states that he is not having very much pain today.  Pain intensity has improved.  However, patient continues to endorse fatigue and shortness of breath.  Oxygen saturation decreased to around 86% when patient exerted himself overnight.  Patient continues to have an oxygen requirement of 4 L/min, oxygen was weaned from 6 on yesterday.  Patient is status post simple exchange transfusion on 08/13/2022.  500 cc were removed and he was transfused 2 units.  Today, patient's hemoglobin is 8.6 g/dL.  Patient endorses shortness of breath with exertion.  Tachycardia continues 120s-140s. Patient denies headache, dizziness, urinary symptoms, nausea, vomiting, or diarrhea.  Objective:  Vital signs in last 24 hours:  Vitals:   08/15/22 1000 08/15/22 1100 08/15/22 1200 08/15/22 1208  BP: (!) 149/80  120/74   Pulse: 97 (!) 103 100   Resp: 16 20 16 16   Temp:   98.8 F (37.1 C)   TempSrc:   Axillary   SpO2: 96% 93% 93% 93%  Weight:      Height:        Intake/Output from previous day:   Intake/Output Summary (Last 24 hours) at 08/15/2022 1353 Last data filed at 08/15/2022 1340 Gross per 24 hour  Intake 2809.99 ml  Output 3831 ml  Net -1021.01 ml    Physical Exam: General: Alert, awake, oriented x3, in no acute distress.  Scleral icterus HEENT: [place]/AT PEERL, EOMI Neck: Trachea midline,  no masses, no thyromegal,y no JVD, no carotid bruit OROPHARYNX:  Moist, No exudate/ erythema/lesions.  Heart: Tachycardia, no murmurs, rubs, or gallops Lungs: Clear to auscultation, no wheezing or rhonchi noted. No increased vocal fremitus resonant to percussion  Abdomen: Soft, nontender, nondistended, positive bowel sounds, no masses  no hepatosplenomegaly noted..  Neuro: No focal neurological deficits noted cranial nerves II through XII grossly intact. DTRs 2+ bilaterally upper and lower extremities. Strength 5 out of 5 in bilateral upper and lower extremities. Musculoskeletal: No warm swelling or erythema around joints, no spinal tenderness noted. Psychiatric: Patient alert and oriented x3, good insight and cognition, good recent to remote recall. Lymph node survey: No cervical axillary or inguinal lymphadenopathy noted.  Lab Results:  Basic Metabolic Panel:    Component Value Date/Time   NA 135 08/14/2022 0702   NA 139 08/06/2021 1030   K 4.3 08/14/2022 0702   CL 98 08/14/2022 0702   CO2 28 08/14/2022 0702   BUN 9 08/14/2022 0702   BUN 9 08/06/2021 1030   CREATININE 0.54 (L) 08/14/2022 0702   CREATININE 0.70 05/12/2016 1052   GLUCOSE 112 (H) 08/14/2022 0702   CALCIUM 8.6 (L) 08/14/2022 0702   CBC:    Component Value Date/Time   WBC 11.1 (H) 08/15/2022 0413   HGB 7.9 (L) 08/15/2022 0413   HGB 7.6 (L) 08/06/2021 1030   HCT 24.1 (L) 08/15/2022 0413   HCT 23.6 (L) 08/06/2021 1030   PLT 201 08/15/2022 0413   PLT 958 (HH) 08/06/2021 1030   MCV 77.7 (L) 08/15/2022 0413   MCV 96 08/06/2021 1030   NEUTROABS 10.4 (H) 08/12/2022 1659   NEUTROABS 3.4 08/06/2021 1030   LYMPHSABS 3.8 08/12/2022 1659   LYMPHSABS 2.4 08/06/2021 1030   MONOABS 1.3 (H) 08/12/2022 1659   EOSABS 0.0  08/12/2022 1659   EOSABS 0.2 08/06/2021 1030   BASOSABS 0.1 08/12/2022 1659   BASOSABS 0.1 08/06/2021 1030    Recent Results (from the past 240 hour(s))  Resp panel by RT-PCR (RSV, Flu A&B, Covid) Anterior Nasal Swab     Status: Abnormal   Collection Time: 08/11/22  8:35 PM   Specimen: Anterior Nasal Swab  Result Value Ref Range Status   SARS Coronavirus 2 by RT PCR NEGATIVE NEGATIVE Final    Comment: (NOTE) SARS-CoV-2 target nucleic acids are NOT DETECTED.  The SARS-CoV-2 RNA is generally detectable in upper  respiratory specimens during the acute phase of infection. The lowest concentration of SARS-CoV-2 viral copies this assay can detect is 138 copies/mL. A negative result does not preclude SARS-Cov-2 infection and should not be used as the sole basis for treatment or other patient management decisions. A negative result may occur with  improper specimen collection/handling, submission of specimen other than nasopharyngeal swab, presence of viral mutation(s) within the areas targeted by this assay, and inadequate number of viral copies(<138 copies/mL). A negative result must be combined with clinical observations, patient history, and epidemiological information. The expected result is Negative.  Fact Sheet for Patients:  BloggerCourse.com  Fact Sheet for Healthcare Providers:  SeriousBroker.it  This test is no t yet approved or cleared by the [place] FDA and  has been authorized for detection and/or diagnosis of SARS-CoV-2 by FDA under an Emergency Use Authorization (EUA). This EUA will remain  in effect (meaning this test can be used) for the duration of the COVID-19 declaration under Section 564(b)(1) of the Act, 21 U.S.C.section 360bbb-3(b)(1), unless the authorization is terminated  or revoked sooner.       Influenza A by PCR POSITIVE (A) NEGATIVE Final   Influenza B by PCR NEGATIVE NEGATIVE Final    Comment: (NOTE) The Xpert Xpress SARS-CoV-2/FLU/RSV plus assay is intended as an aid in the diagnosis of influenza from Nasopharyngeal swab specimens and should not be used as a sole basis for treatment. Nasal washings and aspirates are unacceptable for Xpert Xpress SARS-CoV-2/FLU/RSV testing.  Fact Sheet for Patients: BloggerCourse.com  Fact Sheet for Healthcare Providers: SeriousBroker.it  This test is not yet approved or cleared by the [place] FDA and has been  authorized for detection and/or diagnosis of SARS-CoV-2 by FDA under an Emergency Use Authorization (EUA). This EUA will remain in effect (meaning this test can be used) for the duration of the COVID-19 declaration under Section 564(b)(1) of the Act, 21 U.S.C. section 360bbb-3(b)(1), unless the authorization is terminated or revoked.     Resp Syncytial Virus by PCR NEGATIVE NEGATIVE Final    Comment: (NOTE) Fact Sheet for Patients: BloggerCourse.com  Fact Sheet for Healthcare Providers: SeriousBroker.it  This test is not yet approved or cleared by the [place] FDA and has been authorized for detection and/or diagnosis of SARS-CoV-2 by FDA under an Emergency Use Authorization (EUA). This EUA will remain in effect (meaning this test can be used) for the duration of the COVID-19 declaration under Section 564(b)(1) of the Act, 21 U.S.C. section 360bbb-3(b)(1), unless the authorization is terminated or revoked.  Performed at [facility], [address] [place], [place] [address]   MRSA Next Gen by PCR, Nasal     Status: None   Collection Time: 08/13/22  9:48 PM   Specimen: Nasal Swab  Result Value Ref Range Status   MRSA by PCR Next Gen NOT DETECTED NOT DETECTED Final    Comment: (  NOTE) The GeneXpert MRSA Assay (FDA approved for NASAL specimens only), is one component of a comprehensive MRSA colonization surveillance program. It is not intended to diagnose MRSA infection nor to guide or monitor treatment for MRSA infections. Test performance is not FDA approved in patients less than 30 years old. Performed at [facility], [address], [place], [place] [address]     Studies/Results: No results found.  Medications: Scheduled Meds:  sodium chloride   Intravenous Once   Chlorhexidine Gluconate Cloth  6 each Topical Daily   enoxaparin (LOVENOX) injection  40 mg Subcutaneous Q24H    feeding supplement  237 mL Oral TID BM   HYDROmorphone   Intravenous Q4H   ipratropium-albuterol  3 mL Nebulization BID   ketorolac  15 mg Intravenous Q6H   multivitamin with minerals  1 tablet Oral Daily   oseltamivir  75 mg Oral BID   senna-docusate  1 tablet Oral QHS   Continuous Infusions:  sodium chloride 75 mL/hr at 08/15/22 1200   azithromycin Stopped (08/15/22 1233)   cefTRIAXone (ROCEPHIN)  IV Stopped (08/15/22 1107)   PRN Meds:.acetaminophen, guaiFENesin-dextromethorphan, naloxone **AND** sodium chloride flush, ondansetron (ZOFRAN) IV, mouth rinse, polyethylene glycol  Consultants: none  Procedures: none  Antibiotics: Azithromycin Ceftriaxone  Assessment/Plan: Principal Problem:   Sickle cell pain crisis (HCC) Active Problems:   Sickle cell crisis acute chest syndrome (HCC)   Influenza A   Mild intermittent asthma without complication   Anemia of chronic disease   Chronic pain  Acute chest syndrome: LDH continues to be markedly elevated.  Absolute reticulocyte count has decreased.  Will repeat exchange transfusion today.  Removed 250 cc and transfuse 1 unit PRBCs to follow.  Maintain oxygen saturation above 90%.  Patient is currently maintaining on 4 L, which is improved slightly from 6 L.   Influenza A: Patient positive for influenza A.  Tamiflu 75 mg every 12 hours for total of 5 days. Continue supplemental oxygen Tylenol 650 mg every 4 hours as needed for fever  Community-acquired pneumonia: Patient's CT showed no evidence of pulmonary embolism.  Multiple bilateral lower lobe predominant patchy lung opacities that were concerning for pneumonia, right worse than left.  Will initiate IV broad-spectrum antibiotics.  Azithromycin and ceftriaxone  Sickle cell disease with pain crisis: Continue IV Dilaudid PCA without any changes in settings Toradol 15 mg IV every 6 hours for total of 5 days Monitor vital signs very closely, reevaluate pain scale regularly,  and continue supplemental oxygen  Anemia of chronic disease: Stable.  Continue to follow labs.  Leukocytosis:  Improving Influenza A positive.  CT shows infiltrates.  Will continue Tamiflu.  Initiate IV antibiotics.  Labs in AM   Asthma: Continue nebulizer every 6 hours as needed for shortness of breath, wheezing, and or persistent cough  Code Status: Full Code Family Communication: N/A Disposition Plan: Not yet ready for discharge  [name]  APRN, MSN, FNP-C [facility] [address] [place], [place] [address] [phone]  If 7PM-7AM, please contact night-coverage.  08/15/2022, 1:53 PM  LOS: 3 days

## 2022-08-16 DIAGNOSIS — D5700 Hb-SS disease with crisis, unspecified: Secondary | ICD-10-CM | POA: Diagnosis not present

## 2022-08-16 LAB — LACTATE DEHYDROGENASE: LDH: 1054 U/L — ABNORMAL HIGH (ref 98–192)

## 2022-08-16 LAB — BASIC METABOLIC PANEL
Anion gap: 9 (ref 5–15)
BUN: 11 mg/dL (ref 6–20)
CO2: 25 mmol/L (ref 22–32)
Calcium: 8.4 mg/dL — ABNORMAL LOW (ref 8.9–10.3)
Chloride: 103 mmol/L (ref 98–111)
Creatinine, Ser: 0.61 mg/dL (ref 0.61–1.24)
GFR, Estimated: >60 mL/min (ref >60)
Glucose, Bld: 100 mg/dL — ABNORMAL HIGH (ref 70–99)
Potassium: 4.1 mmol/L (ref 3.5–5.1)
Sodium: 137 mmol/L (ref 135–145)

## 2022-08-16 LAB — CBC
HCT: 24 % — ABNORMAL LOW (ref 39.0–52.0)
Hemoglobin: 7.7 g/dL — ABNORMAL LOW (ref 13.0–17.0)
MCH: 25.6 pg — ABNORMAL LOW (ref 26.0–34.0)
MCHC: 32.1 g/dL (ref 30.0–36.0)
MCV: 79.7 fL — ABNORMAL LOW (ref 80.0–100.0)
Platelets: 219 10*3/uL (ref 150–400)
RBC: 3.01 MIL/uL — ABNORMAL LOW (ref 4.22–5.81)
RDW: 23.1 % — ABNORMAL HIGH (ref 11.5–15.5)
WBC: 10.9 10*3/uL — ABNORMAL HIGH (ref 4.0–10.5)
nRBC: 65.5 % — ABNORMAL HIGH (ref 0.0–0.2)

## 2022-08-16 LAB — RETICULOCYTES
Immature Retic Fract: 15.4 % (ref 2.3–15.9)
RBC.: 3.02 MIL/uL — ABNORMAL LOW (ref 4.22–5.81)
Retic Count, Absolute: 115 10*3/uL (ref 19.0–186.0)
Retic Ct Pct: 3.7 % — ABNORMAL HIGH (ref 0.4–3.1)

## 2022-08-16 MED ORDER — HYDROMORPHONE 1 MG/ML IV SOLN
INTRAVENOUS | Status: DC
  Administered 2022-08-18 (×5): 30 mg via INTRAVENOUS
  Administered 2022-08-18: 10.5 mg via INTRAVENOUS
  Administered 2022-08-18: 2.5 mg via INTRAVENOUS
  Administered 2022-08-19: 2.59 mg via INTRAVENOUS
  Administered 2022-08-19: 5 mg via INTRAVENOUS
  Administered 2022-08-19: 30 mg via INTRAVENOUS
  Filled 2022-08-16 (×5): qty 30

## 2022-08-16 MED ORDER — OXYMETAZOLINE HCL 0.05 % NA SOLN
1.0000 | Freq: Two times a day (BID) | NASAL | Status: DC | PRN
  Administered 2022-08-16: 1 via NASAL

## 2022-08-16 MED ORDER — OXYMETAZOLINE HCL 0.05 % NA SOLN
1.0000 | Freq: Two times a day (BID) | NASAL | Status: DC
  Filled 2022-08-16: qty 15

## 2022-08-16 MED ORDER — OXYCODONE HCL 5 MG PO TABS
10.0000 mg | ORAL_TABLET | ORAL | Status: DC
  Administered 2022-08-16 – 2022-08-19 (×15): 10 mg via ORAL
  Filled 2022-08-16 (×15): qty 2

## 2022-08-16 MED ORDER — SODIUM CHLORIDE 0.45 % IV SOLN: INTRAVENOUS | Status: AC

## 2022-08-16 NOTE — Progress Notes (Signed)
Subjective: Jonathan [name] is a 21 year old male with a medical history significant for sickle cell disease, chronic pain syndrome, opiate dependence and tolerance, and history of asthma was admitted for influenza A and acute chest in the setting of sickle cell pain crisis. Patient's oxygenation has improved overnight.  Oxygen has been weaned to 2 L/min from 4 L/min on yesterday.  Patient says that shortness of breath has improved.  Today, patient's pain intensity is 5/10 primarily to right upper extremity. Patient denies headache, dizziness, urinary symptoms, nausea, vomiting, or diarrhea.  Objective:  Vital signs in last 24 hours:  Vitals:   08/17/22 0519 08/17/22 0810 08/17/22 0925 08/17/22 1003  BP:    (!) 145/76  Pulse:    94  Resp: 18  20 18   Temp:    98.3 F (36.8 C)  TempSrc:    Oral  SpO2: 99% 98%  98%  Weight:      Height:        Intake/Output from previous day:   Intake/Output Summary (Last 24 hours) at 08/17/2022 1114 Last data filed at 08/17/2022 0900 Gross per 24 hour  Intake 743.75 ml  Output 3250 ml  Net -2506.25 ml    Physical Exam: General: Alert, awake, oriented x3, in no acute distress.  Scleral icterus HEENT: /AT PEERL, EOMI Neck: Trachea midline,  no masses, no thyromegal,y no JVD, no carotid bruit OROPHARYNX:  Moist, No exudate/ erythema/lesions.  Heart: Tachycardia, no murmurs, rubs, or gallops Lungs: Clear to auscultation, no wheezing or rhonchi noted. No increased vocal fremitus resonant to percussion  Abdomen: Soft, nontender, nondistended, positive bowel sounds, no masses no hepatosplenomegaly noted..  Neuro: No focal neurological deficits noted cranial nerves II through XII grossly intact. DTRs 2+ bilaterally upper and lower extremities. Strength 5 out of 5 in bilateral upper and lower extremities. Musculoskeletal: No warm swelling or erythema around joints, no spinal tenderness noted. Psychiatric: Patient alert and oriented x3, good insight  and cognition, good recent to remote recall. Lymph node survey: No cervical axillary or inguinal lymphadenopathy noted.  Lab Results:  Basic Metabolic Panel:    Component Value Date/Time   NA 137 08/16/2022 0315   NA 139 08/06/2021 1030   K 4.1 08/16/2022 0315   CL 103 08/16/2022 0315   CO2 25 08/16/2022 0315   BUN 11 08/16/2022 0315   BUN 9 08/06/2021 1030   CREATININE 0.61 08/16/2022 0315   CREATININE 0.70 05/12/2016 1052   GLUCOSE 100 (H) 08/16/2022 0315   CALCIUM 8.4 (L) 08/16/2022 0315   CBC:    Component Value Date/Time   WBC 12.4 (H) 08/17/2022 0627   HGB 7.2 (L) 08/17/2022 0627   HGB 7.6 (L) 08/06/2021 1030   HCT 22.6 (L) 08/17/2022 0627   HCT 23.6 (L) 08/06/2021 1030   PLT 289 08/17/2022 0627   PLT 958 (HH) 08/06/2021 1030   MCV 77.9 (L) 08/17/2022 0627   MCV 96 08/06/2021 1030   NEUTROABS 10.4 (H) 08/12/2022 1659   NEUTROABS 3.4 08/06/2021 1030   LYMPHSABS 3.8 08/12/2022 1659   LYMPHSABS 2.4 08/06/2021 1030   MONOABS 1.3 (H) 08/12/2022 1659   EOSABS 0.0 08/12/2022 1659   EOSABS 0.2 08/06/2021 1030   BASOSABS 0.1 08/12/2022 1659   BASOSABS 0.1 08/06/2021 1030    Recent Results (from the past 240 hour(s))  Resp panel by RT-PCR (RSV, Flu A&B, Covid) Anterior Nasal Swab     Status: Abnormal   Collection Time: 08/11/22  8:35 PM   Specimen: Anterior  Nasal Swab  Result Value Ref Range Status   SARS Coronavirus 2 by RT PCR NEGATIVE NEGATIVE Final    Comment: (NOTE) SARS-CoV-2 target nucleic acids are NOT DETECTED.  The SARS-CoV-2 RNA is generally detectable in upper respiratory specimens during the acute phase of infection. The lowest concentration of SARS-CoV-2 viral copies this assay can detect is 138 copies/mL. A negative result does not preclude SARS-Cov-2 infection and should not be used as the sole basis for treatment or other patient management decisions. A negative result may occur with  improper specimen collection/handling, submission of specimen  other than nasopharyngeal swab, presence of viral mutation(s) within the areas targeted by this assay, and inadequate number of viral copies(<138 copies/mL). A negative result must be combined with clinical observations, patient history, and epidemiological information. The expected result is Negative.  Fact Sheet for Patients:  BloggerCourse.com  Fact Sheet for Healthcare Providers:  SeriousBroker.it  This test is no t yet approved or cleared by the [place] FDA and  has been authorized for detection and/or diagnosis of SARS-CoV-2 by FDA under an Emergency Use Authorization (EUA). This EUA will remain  in effect (meaning this test can be used) for the duration of the COVID-19 declaration under Section 564(b)(1) of the Act, 21 U.S.C.section 360bbb-3(b)(1), unless the authorization is terminated  or revoked sooner.       Influenza A by PCR POSITIVE (A) NEGATIVE Final   Influenza B by PCR NEGATIVE NEGATIVE Final    Comment: (NOTE) The Xpert Xpress SARS-CoV-2/FLU/RSV plus assay is intended as an aid in the diagnosis of influenza from Nasopharyngeal swab specimens and should not be used as a sole basis for treatment. Nasal washings and aspirates are unacceptable for Xpert Xpress SARS-CoV-2/FLU/RSV testing.  Fact Sheet for Patients: BloggerCourse.com  Fact Sheet for Healthcare Providers: SeriousBroker.it  This test is not yet approved or cleared by the [place] FDA and has been authorized for detection and/or diagnosis of SARS-CoV-2 by FDA under an Emergency Use Authorization (EUA). This EUA will remain in effect (meaning this test can be used) for the duration of the COVID-19 declaration under Section 564(b)(1) of the Act, 21 U.S.C. section 360bbb-3(b)(1), unless the authorization is terminated or revoked.     Resp Syncytial Virus by PCR NEGATIVE NEGATIVE Final     Comment: (NOTE) Fact Sheet for Patients: BloggerCourse.com  Fact Sheet for Healthcare Providers: SeriousBroker.it  This test is not yet approved or cleared by the [place] FDA and has been authorized for detection and/or diagnosis of SARS-CoV-2 by FDA under an Emergency Use Authorization (EUA). This EUA will remain in effect (meaning this test can be used) for the duration of the COVID-19 declaration under Section 564(b)(1) of the Act, 21 U.S.C. section 360bbb-3(b)(1), unless the authorization is terminated or revoked.  Performed at [facility], [address] [place], [place] [address]   MRSA Next Gen by PCR, Nasal     Status: None   Collection Time: 08/13/22  9:48 PM   Specimen: Nasal Swab  Result Value Ref Range Status   MRSA by PCR Next Gen NOT DETECTED NOT DETECTED Final    Comment: (NOTE) The GeneXpert MRSA Assay (FDA approved for NASAL specimens only), is one component of a comprehensive MRSA colonization surveillance program. It is not intended to diagnose MRSA infection nor to guide or monitor treatment for MRSA infections. Test performance is not FDA approved in patients less than 18 years old. Performed at [facility], [address] [place], [place] [address]     Studies/Results: No results found.  Medications: Scheduled Meds:  Chlorhexidine Gluconate Cloth  6 each Topical Daily   enoxaparin (LOVENOX) injection  40 mg Subcutaneous Q24H   feeding supplement  237 mL Oral TID BM   HYDROmorphone   Intravenous Q4H   ipratropium-albuterol  3 mL Nebulization BID   ketorolac  15 mg Intravenous Q6H   multivitamin with minerals  1 tablet Oral Daily   oxyCODONE  10 mg Oral Q4H while awake   senna-docusate  1 tablet Oral QHS   Continuous Infusions:  cefTRIAXone (ROCEPHIN)  IV Stopped (08/16/22 1050)   PRN Meds:.acetaminophen, guaiFENesin-dextromethorphan, naloxone  **AND** sodium chloride flush, ondansetron (ZOFRAN) IV, mouth rinse, oxymetazoline, polyethylene glycol  Consultants: none  Procedures: none  Antibiotics: Azithromycin Ceftriaxone  Assessment/Plan: Principal Problem:   Sickle cell pain crisis (HCC) Active Problems:   Sickle cell crisis acute chest syndrome (HCC)   Influenza A   Mild intermittent asthma without complication   Anemia of chronic disease   Chronic pain  Acute chest syndrome: LDH trending down.  Absolute reticulocyte count continues to be increased.  Will repeat reticulocytes and LDH in AM..  Absolute reticulocyte count has decreased.  Maintain oxygen saturation above 90%.  Patient is currently maintaining oxygen saturation above 90% on 2 L supplemental oxygen per minute.   Influenza A: Patient positive for influenza A.  Tamiflu 75 mg every 12 hours for total of 5 days. Continue supplemental oxygen Tylenol 650 mg every 4 hours as needed for fever  Community-acquired pneumonia: Patient's CT showed no evidence of pulmonary embolism.  Multiple bilateral lower lobe predominant patchy lung opacities that were concerning for pneumonia, right worse than left.  Will initiate IV broad-spectrum antibiotics.  Azithromycin and ceftriaxone  Sickle cell disease with pain crisis: Decrease PCA settings Oxycodone 10 mg every 4 hours while awake Toradol 15 mg IV every 6 hours for total of 5 days Monitor vital signs very closely, reevaluate pain scale regularly, and continue supplemental oxygen  Anemia of chronic disease: Stable.  Continue to follow labs.  Leukocytosis:  Improving.  Will continue IV antibiotics, consider de-escalating in AM.  Labs in AM   Asthma: Continue nebulizer every 6 hours as needed for shortness of breath, wheezing, and or persistent cough  Code Status: Full Code Family Communication: N/A Disposition Plan: Not yet ready for discharge  [name]  APRN, MSN, FNP-C Patient Care  Center [facility] [address] [place], [place] [address] [phone]  If 7PM-7AM, please contact night-coverage.  08/17/2022, 11:14 AM  LOS: 5 days

## 2022-08-16 NOTE — Progress Notes (Signed)
   08/16/22 1905  Assess: MEWS Score  Temp (!) 101 F (38.3 C)  BP (!) 153/83  MAP (mmHg) 102  Pulse Rate (!) 115  Resp 18  SpO2 (!) 87 %  O2 Device Room Air  Assess: MEWS Score  MEWS Temp 1  MEWS Systolic 0  MEWS Pulse 2  MEWS RR 0  MEWS LOC 0  MEWS Score 3  MEWS Score Color Yellow  Assess: if the MEWS score is Yellow or Red  Were vital signs taken at a resting state? Yes  Focused Assessment No change from prior assessment  Does the patient meet 2 or more of the SIRS criteria? Yes  Does the patient have a confirmed or suspected source of infection? Yes  Provider and Rapid Response Notified? Yes  MEWS guidelines implemented *See Row Information* Yes  Treat  MEWS Interventions Administered scheduled meds/treatments  Take Vital Signs  Increase Vital Sign Frequency  Yellow: Q 2hr X 2 then Q 4hr X 2, if remains yellow, continue Q 4hrs  Escalate  MEWS: Escalate Yellow: discuss with charge nurse/RN and consider discussing with provider and RRT  Notify: Charge Nurse/RN  Name of Charge Nurse/RN Notified Engineering geologist  Date Charge Nurse/RN Notified 08/16/22  Time Charge Nurse/RN Notified 1920  Document  Patient Outcome Stabilized after interventions (TYLENOL ADMINISTERED)  Progress note created (see row info) Yes  Assess: SIRS CRITERIA  SIRS Temperature  1  SIRS Pulse 1  SIRS Respirations  0  SIRS WBC 0  SIRS Score Sum  2

## 2022-08-17 DIAGNOSIS — D5700 Hb-SS disease with crisis, unspecified: Secondary | ICD-10-CM | POA: Diagnosis not present

## 2022-08-17 LAB — CBC
HCT: 22.6 % — ABNORMAL LOW (ref 39.0–52.0)
Hemoglobin: 7.2 g/dL — ABNORMAL LOW (ref 13.0–17.0)
MCH: 24.8 pg — ABNORMAL LOW (ref 26.0–34.0)
MCHC: 31.9 g/dL (ref 30.0–36.0)
MCV: 77.9 fL — ABNORMAL LOW (ref 80.0–100.0)
Platelets: 289 10*3/uL (ref 150–400)
RBC: 2.9 MIL/uL — ABNORMAL LOW (ref 4.22–5.81)
RDW: 23.4 % — ABNORMAL HIGH (ref 11.5–15.5)
WBC: 12.4 10*3/uL — ABNORMAL HIGH (ref 4.0–10.5)
nRBC: 34.1 % — ABNORMAL HIGH (ref 0.0–0.2)

## 2022-08-17 LAB — RETICULOCYTES
Immature Retic Fract: 18.8 % — ABNORMAL HIGH (ref 2.3–15.9)
RBC.: 2.88 MIL/uL — ABNORMAL LOW (ref 4.22–5.81)
Retic Count, Absolute: 106.5 10*3/uL (ref 19.0–186.0)
Retic Ct Pct: 3.7 % — ABNORMAL HIGH (ref 0.4–3.1)

## 2022-08-17 LAB — LACTATE DEHYDROGENASE: LDH: 777 U/L — ABNORMAL HIGH (ref 98–192)

## 2022-08-17 MED ORDER — SODIUM CHLORIDE 0.45 % IV SOLN: INTRAVENOUS | Status: AC

## 2022-08-17 NOTE — Progress Notes (Signed)
Subjective: Jonathan [name] is a 21 year old male with a medical history significant for sickle cell disease, chronic pain syndrome, opiate dependence and tolerance, and history of asthma was admitted for influenza A and acute chest in the setting of sickle cell pain crisis. Patient continues to maintain his oxygen saturation above 90% on 2 L.  Patient spiked temperature.  Patient's pain intensity is 5/10 primarily to right upper extremity. Patient denies headache, dizziness, urinary symptoms, nausea, vomiting, or diarrhea.  Objective:  Vital signs in last 24 hours:  Vitals:   08/17/22 0519 08/17/22 0810 08/17/22 0925 08/17/22 1003  BP:    (!) 145/76  Pulse:    94  Resp: 18  20 18   Temp:    98.3 F (36.8 C)  TempSrc:    Oral  SpO2: 99% 98%  98%  Weight:      Height:        Intake/Output from previous day:   Intake/Output Summary (Last 24 hours) at 08/17/2022 1117 Last data filed at 08/17/2022 0900 Gross per 24 hour  Intake 743.75 ml  Output 3250 ml  Net -2506.25 ml    Physical Exam: General: Alert, awake, oriented x3, in no acute distress.  Scleral icterus HEENT: [place]/AT PEERL, EOMI Neck: Trachea midline,  no masses, no thyromegal,y no JVD, no carotid bruit OROPHARYNX:  Moist, No exudate/ erythema/lesions.  Heart: Tachycardia, no murmurs, rubs, or gallops Lungs: Clear to auscultation, no wheezing or rhonchi noted. No increased vocal fremitus resonant to percussion  Abdomen: Soft, nontender, nondistended, positive bowel sounds, no masses no hepatosplenomegaly noted..  Neuro: No focal neurological deficits noted cranial nerves II through XII grossly intact. DTRs 2+ bilaterally upper and lower extremities. Strength 5 out of 5 in bilateral upper and lower extremities. Musculoskeletal: No warm swelling or erythema around joints, no spinal tenderness noted. Psychiatric: Patient alert and oriented x3, good insight and cognition, good recent to remote recall. Lymph node survey: No  cervical axillary or inguinal lymphadenopathy noted.  Lab Results:  Basic Metabolic Panel:    Component Value Date/Time   NA 137 08/16/2022 0315   NA 139 08/06/2021 1030   K 4.1 08/16/2022 0315   CL 103 08/16/2022 0315   CO2 25 08/16/2022 0315   BUN 11 08/16/2022 0315   BUN 9 08/06/2021 1030   CREATININE 0.61 08/16/2022 0315   CREATININE 0.70 05/12/2016 1052   GLUCOSE 100 (H) 08/16/2022 0315   CALCIUM 8.4 (L) 08/16/2022 0315   CBC:    Component Value Date/Time   WBC 12.4 (H) 08/17/2022 0627   HGB 7.2 (L) 08/17/2022 0627   HGB 7.6 (L) 08/06/2021 1030   HCT 22.6 (L) 08/17/2022 0627   HCT 23.6 (L) 08/06/2021 1030   PLT 289 08/17/2022 0627   PLT 958 (HH) 08/06/2021 1030   MCV 77.9 (L) 08/17/2022 0627   MCV 96 08/06/2021 1030   NEUTROABS 10.4 (H) 08/12/2022 1659   NEUTROABS 3.4 08/06/2021 1030   LYMPHSABS 3.8 08/12/2022 1659   LYMPHSABS 2.4 08/06/2021 1030   MONOABS 1.3 (H) 08/12/2022 1659   EOSABS 0.0 08/12/2022 1659   EOSABS 0.2 08/06/2021 1030   BASOSABS 0.1 08/12/2022 1659   BASOSABS 0.1 08/06/2021 1030    Recent Results (from the past 240 hour(s))  Resp panel by RT-PCR (RSV, Flu A&B, Covid) Anterior Nasal Swab     Status: Abnormal   Collection Time: 08/11/22  8:35 PM   Specimen: Anterior Nasal Swab  Result Value Ref Range Status   SARS Coronavirus  2 by RT PCR NEGATIVE NEGATIVE Final    Comment: (NOTE) SARS-CoV-2 target nucleic acids are NOT DETECTED.  The SARS-CoV-2 RNA is generally detectable in upper respiratory specimens during the acute phase of infection. The lowest concentration of SARS-CoV-2 viral copies this assay can detect is 138 copies/mL. A negative result does not preclude SARS-Cov-2 infection and should not be used as the sole basis for treatment or other patient management decisions. A negative result may occur with  improper specimen collection/handling, submission of specimen other than nasopharyngeal swab, presence of viral mutation(s)  within the areas targeted by this assay, and inadequate number of viral copies(<138 copies/mL). A negative result must be combined with clinical observations, patient history, and epidemiological information. The expected result is Negative.  Fact Sheet for Patients:  BloggerCourse.com  Fact Sheet for Healthcare Providers:  SeriousBroker.it  This test is no t yet approved or cleared by the [place] FDA and  has been authorized for detection and/or diagnosis of SARS-CoV-2 by FDA under an Emergency Use Authorization (EUA). This EUA will remain  in effect (meaning this test can be used) for the duration of the COVID-19 declaration under Section 564(b)(1) of the Act, 21 U.S.C.section 360bbb-3(b)(1), unless the authorization is terminated  or revoked sooner.       Influenza A by PCR POSITIVE (A) NEGATIVE Final   Influenza B by PCR NEGATIVE NEGATIVE Final    Comment: (NOTE) The Xpert Xpress SARS-CoV-2/FLU/RSV plus assay is intended as an aid in the diagnosis of influenza from Nasopharyngeal swab specimens and should not be used as a sole basis for treatment. Nasal washings and aspirates are unacceptable for Xpert Xpress SARS-CoV-2/FLU/RSV testing.  Fact Sheet for Patients: BloggerCourse.com  Fact Sheet for Healthcare Providers: SeriousBroker.it  This test is not yet approved or cleared by the [place] FDA and has been authorized for detection and/or diagnosis of SARS-CoV-2 by FDA under an Emergency Use Authorization (EUA). This EUA will remain in effect (meaning this test can be used) for the duration of the COVID-19 declaration under Section 564(b)(1) of the Act, 21 U.S.C. section 360bbb-3(b)(1), unless the authorization is terminated or revoked.     Resp Syncytial Virus by PCR NEGATIVE NEGATIVE Final    Comment: (NOTE) Fact Sheet for  Patients: BloggerCourse.com  Fact Sheet for Healthcare Providers: SeriousBroker.it  This test is not yet approved or cleared by the [place] FDA and has been authorized for detection and/or diagnosis of SARS-CoV-2 by FDA under an Emergency Use Authorization (EUA). This EUA will remain in effect (meaning this test can be used) for the duration of the COVID-19 declaration under Section 564(b)(1) of the Act, 21 U.S.C. section 360bbb-3(b)(1), unless the authorization is terminated or revoked.  Performed at [facility], [address], [place], [place] [address]   MRSA Next Gen by PCR, Nasal     Status: None   Collection Time: 08/13/22  9:48 PM   Specimen: Nasal Swab  Result Value Ref Range Status   MRSA by PCR Next Gen NOT DETECTED NOT DETECTED Final    Comment: (NOTE) The GeneXpert MRSA Assay (FDA approved for NASAL specimens only), is one component of a comprehensive MRSA colonization surveillance program. It is not intended to diagnose MRSA infection nor to guide or monitor treatment for MRSA infections. Test performance is not FDA approved in patients less than 80 years old. Performed at [facility], [address] [place], [place] [address]     Studies/Results: No results found.  Medications: Scheduled Meds:  Chlorhexidine Gluconate Cloth  6 each Topical Daily   enoxaparin (LOVENOX) injection  40 mg Subcutaneous Q24H   feeding supplement  237 mL Oral TID BM   HYDROmorphone   Intravenous Q4H   ipratropium-albuterol  3 mL Nebulization BID   ketorolac  15 mg Intravenous Q6H   multivitamin with minerals  1 tablet Oral Daily   oxyCODONE  10 mg Oral Q4H while awake   senna-docusate  1 tablet Oral QHS   Continuous Infusions:  cefTRIAXone (ROCEPHIN)  IV Stopped (08/16/22 1050)   PRN Meds:.acetaminophen, guaiFENesin-dextromethorphan, naloxone **AND** sodium chloride flush,  ondansetron (ZOFRAN) IV, mouth rinse, oxymetazoline, polyethylene glycol  Consultants: none  Procedures: none  Antibiotics: Azithromycin Ceftriaxone  Assessment/Plan: Principal Problem:   Sickle cell pain crisis (HCC) Active Problems:   Sickle cell crisis acute chest syndrome (HCC)   Influenza A   Mild intermittent asthma without complication   Anemia of chronic disease   Chronic pain  Acute chest syndrome: LDH continues to trend down.  Absolute reticulocyte count has been decreased, will add on reticulocyte count to previous labs..    Maintain oxygen saturation above 90%.  Patient is currently maintaining oxygen saturation above 90% on 2 L supplemental oxygen per minute.   Influenza A: Patient has completed Tamiflu, will continue symptom management.  Continue supplemental oxygen Tylenol 650 mg every 4 hours as needed for fever  Community-acquired pneumonia: Patient's CT showed no evidence of pulmonary embolism.  Multiple bilateral lower lobe predominant patchy lung opacities that were concerning for pneumonia, right worse than left.  Will initiate IV broad-spectrum antibiotics.  Azithromycin and ceftriaxone  Sickle cell disease with pain crisis: Decrease PCA settings Oxycodone 10 mg every 4 hours while awake Toradol 15 mg IV every 6 hours for total of 5 days Monitor vital signs very closely, reevaluate pain scale regularly, and continue supplemental oxygen  Anemia of chronic disease: Stable.  Continue to follow labs.  Leukocytosis:  Improving.  Will continue IV antibiotics, consider de-escalating in AM.  Labs in AM   Asthma: Continue nebulizer every 6 hours as needed for shortness of breath, wheezing, and or persistent cough  Code Status: Full Code Family Communication: N/A Disposition Plan: Not yet ready for discharge  [name]  APRN, MSN, FNP-C Patient Care Center [facility] [address] [place], [place]  [address] [phone]  If 7PM-7AM, please contact night-coverage.  08/17/2022, 11:17 AM  LOS: 5 days

## 2022-08-18 DIAGNOSIS — D5700 Hb-SS disease with crisis, unspecified: Secondary | ICD-10-CM | POA: Diagnosis not present

## 2022-08-18 LAB — BPAM RBC
Blood Product Expiration Date: 202401182359
Blood Product Expiration Date: 202401182359
Blood Product Expiration Date: 202401222359
ISSUE DATE / TIME: 202312201132
ISSUE DATE / TIME: 202312201513
ISSUE DATE / TIME: 202312221559
Unit Type and Rh: 5100
Unit Type and Rh: 5100
Unit Type and Rh: 5100

## 2022-08-18 LAB — TYPE AND SCREEN
ABO/RH(D): O POS
Antibody Screen: NEGATIVE
Unit division: 0
Unit division: 0
Unit division: 0

## 2022-08-18 LAB — RETICULOCYTES
Immature Retic Fract: 21.9 % — ABNORMAL HIGH (ref 2.3–15.9)
RBC.: 3.31 MIL/uL — ABNORMAL LOW (ref 4.22–5.81)
Retic Count, Absolute: 143.3 10*3/uL (ref 19.0–186.0)
Retic Ct Pct: 4.3 % — ABNORMAL HIGH (ref 0.4–3.1)

## 2022-08-18 LAB — CBC
HCT: 25.9 % — ABNORMAL LOW (ref 39.0–52.0)
Hemoglobin: 8.4 g/dL — ABNORMAL LOW (ref 13.0–17.0)
MCH: 25.5 pg — ABNORMAL LOW (ref 26.0–34.0)
MCHC: 32.4 g/dL (ref 30.0–36.0)
MCV: 78.5 fL — ABNORMAL LOW (ref 80.0–100.0)
Platelets: 399 10*3/uL (ref 150–400)
RBC: 3.3 MIL/uL — ABNORMAL LOW (ref 4.22–5.81)
RDW: 23.5 % — ABNORMAL HIGH (ref 11.5–15.5)
WBC: 14.3 10*3/uL — ABNORMAL HIGH (ref 4.0–10.5)
nRBC: 1.5 % — ABNORMAL HIGH (ref 0.0–0.2)

## 2022-08-18 LAB — COMPREHENSIVE METABOLIC PANEL
ALT: 11 U/L (ref 0–44)
AST: 19 U/L (ref 15–41)
Albumin: 3 g/dL — ABNORMAL LOW (ref 3.5–5.0)
Alkaline Phosphatase: 249 U/L — ABNORMAL HIGH (ref 38–126)
Anion gap: 11 (ref 5–15)
BUN: 13 mg/dL (ref 6–20)
CO2: 26 mmol/L (ref 22–32)
Calcium: 9.1 mg/dL (ref 8.9–10.3)
Chloride: 99 mmol/L (ref 98–111)
Creatinine, Ser: 0.68 mg/dL (ref 0.61–1.24)
GFR, Estimated: >60 mL/min (ref >60)
Glucose, Bld: 116 mg/dL — ABNORMAL HIGH (ref 70–99)
Potassium: 4.4 mmol/L (ref 3.5–5.1)
Sodium: 136 mmol/L (ref 135–145)
Total Bilirubin: 2.4 mg/dL — ABNORMAL HIGH (ref 0.3–1.2)
Total Protein: 8 g/dL (ref 6.5–8.1)

## 2022-08-18 LAB — LACTATE DEHYDROGENASE: LDH: 655 U/L — ABNORMAL HIGH (ref 98–192)

## 2022-08-18 MED ORDER — IPRATROPIUM-ALBUTEROL 0.5-2.5 (3) MG/3ML IN SOLN: 3.0000 mL | Freq: Four times a day (QID) | RESPIRATORY_TRACT | Status: DC | PRN

## 2022-08-18 MED ORDER — AMOXICILLIN-POT CLAVULANATE 875-125 MG PO TABS
1.0000 | ORAL_TABLET | Freq: Two times a day (BID) | ORAL | Status: DC
  Administered 2022-08-19: 1 via ORAL
  Filled 2022-08-18: qty 1

## 2022-08-18 NOTE — Progress Notes (Signed)
Writer walked into pt room and pt's guest was actively vaping in the room. Education provided to guest that pt has the flu and it has effected his respiratory system. Pt's O2 has dropped as low as 70% today. Pt is also on droplet precautions and guest is not wearing a mask. One warning given and vape was confiscated by Clinical research associate. If further policies are not abided by guest will be asked to leave, will continue to monitor.

## 2022-08-18 NOTE — Progress Notes (Signed)
Subjective: Jonathan [name] is a 21 year old male with a medical history significant for sickle cell disease, chronic pain syndrome, opiate dependence and tolerance, and history of asthma was admitted for influenza A and acute chest in the setting of sickle cell pain crisis. Patient continues to maintain his oxygen saturation above 90% on 2 L.   Patient's pain intensity is 5/10 primarily to right upper extremity and bilateral lower extremities Patient denies headache, dizziness, urinary symptoms, nausea, vomiting, or diarrhea.  Objective:  Vital signs in last 24 hours:  Vitals:   08/18/22 0422 08/18/22 0750 08/18/22 0847 08/18/22 1035  BP: (!) 130/90   131/79  Pulse: (!) 101   (!) 104  Resp: 18   18  Temp: 98.5 F (36.9 C)   98.2 F (36.8 C)  TempSrc: Oral   Oral  SpO2: 99% 93% 95% 97%  Weight:      Height:        Intake/Output from previous day:   Intake/Output Summary (Last 24 hours) at 08/18/2022 1346 Last data filed at 08/18/2022 1230 Gross per 24 hour  Intake 240 ml  Output 4150 ml  Net -3910 ml    Physical Exam: General: Alert, awake, oriented x3, in no acute distress.  Scleral icterus HEENT: [place]/AT [place]ER[place], EOMI Neck: Trachea midline,  no masses, no thyromegal,y no JVD, no carotid bruit OROPHARYNX:  Moist, No exudate/ erythema/lesions.  Heart: Tachycardia, no murmurs, rubs, or gallops Lungs: Clear to auscultation, no wheezing or rhonchi noted. No increased vocal fremitus resonant to percussion  Abdomen: Soft, nontender, nondistended, positive bowel sounds, no masses no hepatosplenomegaly noted..  Neuro: No focal neurological deficits noted cranial nerves II through XII grossly intact. DTRs 2+ bilaterally upper and lower extremities. Strength 5 out of 5 in bilateral upper and lower extremities. Musculoskeletal: No warm swelling or erythema around joints, no spinal tenderness noted. Psychiatric: Patient alert and oriented x3, good insight and cognition, good recent to  remote recall. Lymph node survey: No cervical axillary or inguinal lymphadenopathy noted.  Lab Results:  Basic Metabolic Panel:    Component Value Date/Time   NA 137 08/16/2022 0315   NA 139 08/06/2021 1030   K 4.1 08/16/2022 0315   CL 103 08/16/2022 0315   CO2 25 08/16/2022 0315   BUN 11 08/16/2022 0315   BUN 9 08/06/2021 1030   CREATININE 0.61 08/16/2022 0315   CREATININE 0.70 05/12/2016 1052   GLUCOSE 100 (H) 08/16/2022 0315   CALCIUM 8.4 (L) 08/16/2022 0315   CBC:    Component Value Date/Time   WBC 14.3 (H) 08/18/2022 1318   HGB 8.4 (L) 08/18/2022 1318   HGB 7.6 (L) 08/06/2021 1030   HCT 25.9 (L) 08/18/2022 1318   HCT 23.6 (L) 08/06/2021 1030   PLT 399 08/18/2022 1318   PLT 958 (HH) 08/06/2021 1030   MCV 78.5 (L) 08/18/2022 1318   MCV 96 08/06/2021 1030   NEUTROABS 10.4 (H) 08/12/2022 1659   NEUTROABS 3.4 08/06/2021 1030   LYMPHSABS 3.8 08/12/2022 1659   LYMPHSABS 2.4 08/06/2021 1030   MONOABS 1.3 (H) 08/12/2022 1659   EOSABS 0.0 08/12/2022 1659   EOSABS 0.2 08/06/2021 1030   BASOSABS 0.1 08/12/2022 1659   BASOSABS 0.1 08/06/2021 1030    Recent Results (from the past 240 hour(s))  Resp panel by RT-PCR (RSV, Flu A&B, Covid) Anterior Nasal Swab     Status: Abnormal   Collection Time: 08/11/22  8:35 PM   Specimen: Anterior Nasal Swab  Result Value Ref  Range Status   SARS Coronavirus 2 by RT PCR NEGATIVE NEGATIVE Final    Comment: (NOTE) SARS-CoV-2 target nucleic acids are NOT DETECTED.  The SARS-CoV-2 RNA is generally detectable in upper respiratory specimens during the acute phase of infection. The lowest concentration of SARS-CoV-2 viral copies this assay can detect is 138 copies/mL. A negative result does not preclude SARS-Cov-2 infection and should not be used as the sole basis for treatment or other patient management decisions. A negative result may occur with  improper specimen collection/handling, submission of specimen other than nasopharyngeal  swab, presence of viral mutation(s) within the areas targeted by this assay, and inadequate number of viral copies(<138 copies/mL). A negative result must be combined with clinical observations, patient history, and epidemiological information. The expected result is Negative.  Fact Sheet for Patients:  BloggerCourse.com  Fact Sheet for Healthcare Providers:  SeriousBroker.it  This test is no t yet approved or cleared by the [place] FDA and  has been authorized for detection and/or diagnosis of SARS-CoV-2 by FDA under an Emergency Use Authorization (EUA). This EUA will remain  in effect (meaning this test can be used) for the duration of the COVID-19 declaration under Section 564(b)(1) of the Act, 21 U.S.C.section 360bbb-3(b)(1), unless the authorization is terminated  or revoked sooner.       Influenza A by PCR POSITIVE (A) NEGATIVE Final   Influenza B by PCR NEGATIVE NEGATIVE Final    Comment: (NOTE) The Xpert Xpress SARS-CoV-2/FLU/RSV plus assay is intended as an aid in the diagnosis of influenza from Nasopharyngeal swab specimens and should not be used as a sole basis for treatment. Nasal washings and aspirates are unacceptable for Xpert Xpress SARS-CoV-2/FLU/RSV testing.  Fact Sheet for Patients: BloggerCourse.com  Fact Sheet for Healthcare Providers: SeriousBroker.it  This test is not yet approved or cleared by the [place] FDA and has been authorized for detection and/or diagnosis of SARS-CoV-2 by FDA under an Emergency Use Authorization (EUA). This EUA will remain in effect (meaning this test can be used) for the duration of the COVID-19 declaration under Section 564(b)(1) of the Act, 21 U.S.C. section 360bbb-3(b)(1), unless the authorization is terminated or revoked.     Resp Syncytial Virus by PCR NEGATIVE NEGATIVE Final    Comment: (NOTE) Fact Sheet  for Patients: BloggerCourse.com  Fact Sheet for Healthcare Providers: SeriousBroker.it  This test is not yet approved or cleared by the [place] FDA and has been authorized for detection and/or diagnosis of SARS-CoV-2 by FDA under an Emergency Use Authorization (EUA). This EUA will remain in effect (meaning this test can be used) for the duration of the COVID-19 declaration under Section 564(b)(1) of the Act, 21 U.S.C. section 360bbb-3(b)(1), unless the authorization is terminated or revoked.  Performed at [facility], [address] [place], [place] [address]   MRSA Next Gen by PCR, Nasal     Status: None   Collection Time: 08/13/22  9:48 PM   Specimen: Nasal Swab  Result Value Ref Range Status   MRSA by PCR Next Gen NOT DETECTED NOT DETECTED Final    Comment: (NOTE) The GeneXpert MRSA Assay (FDA approved for NASAL specimens only), is one component of a comprehensive MRSA colonization surveillance program. It is not intended to diagnose MRSA infection nor to guide or monitor treatment for MRSA infections. Test performance is not FDA approved in patients less than 72 years old. Performed at [facility], [address] [place], [place] [address]  Studies/Results: No results found.  Medications: Scheduled Meds:  amoxicillin-clavulanate  1 tablet Oral Q12H   Chlorhexidine Gluconate Cloth  6 each Topical Daily   enoxaparin (LOVENOX) injection  40 mg Subcutaneous Q24H   feeding supplement  237 mL Oral TID BM   HYDROmorphone   Intravenous Q4H   ipratropium-albuterol  3 mL Nebulization BID   multivitamin with minerals  1 tablet Oral Daily   oxyCODONE  10 mg Oral Q4H while awake   senna-docusate  1 tablet Oral QHS   Continuous Infusions:   PRN Meds:.acetaminophen, guaiFENesin-dextromethorphan, naloxone **AND** sodium chloride flush, ondansetron (ZOFRAN) IV, mouth rinse,  oxymetazoline, polyethylene glycol  Consultants: none  Procedures: none  Antibiotics: Azithromycin Ceftriaxone  Assessment/Plan: Principal Problem:   Sickle cell pain crisis (HCC) Active Problems:   Sickle cell crisis acute chest syndrome (HCC)   Influenza A   Mild intermittent asthma without complication   Anemia of chronic disease   Chronic pain  Acute chest syndrome: LDH continues to trend down.  Absolute reticulocyte count has been decreased, will add on reticulocyte count to previous labs..    Maintain oxygen saturation above 90%.  Patient is currently maintaining oxygen saturation above 90% on 2 L supplemental oxygen per minute. Patient's oxygen saturation decreased to 86% without oxygen. Repeat ambulating pulse oximetry in a.m, patient may warrant home oxygen.   Influenza A: Patient has completed Tamiflu, will continue symptom management.  Continue supplemental oxygen Tylenol 650 mg every 4 hours as needed for fever  Community-acquired pneumonia: Patient's CT showed no evidence of pulmonary embolism.  Multiple bilateral lower lobe predominant patchy lung opacities that were concerning for pneumonia, right worse than left.  Discontinue ceftriaxone. Will complete treatment with Augmentin 875-125 mg every 12 hours for 6 days.  Sickle cell disease with pain crisis: Decrease PCA settings Oxycodone 10 mg every 4 hours while awake Toradol 15 mg IV every 6 hours for total of 5 days Monitor vital signs very closely, reevaluate pain scale regularly, and continue supplemental oxygen  Anemia of chronic disease: Stable.  Continue to follow labs.  Leukocytosis:  Improving.  Will continue IV antibiotics, consider de-escalating in AM.  Labs in AM   Asthma: Continue nebulizer every 6 hours as needed for shortness of breath, wheezing, and or persistent cough  Code Status: Full Code Family Communication: N/A Disposition Plan: Not yet ready for discharge.  Discharge planned for  08/19/2022.  [name]  APRN, MSN, FNP-C Patient Care [facility] [address] [place], [place] [address] [phone]  If 7PM-7AM, please contact night-coverage.  08/18/2022, 1:46 PM  LOS: 6 days

## 2022-08-19 DIAGNOSIS — J111 Influenza due to unidentified influenza virus with other respiratory manifestations: Secondary | ICD-10-CM

## 2022-08-19 DIAGNOSIS — R7309 Other abnormal glucose: Secondary | ICD-10-CM

## 2022-08-19 DIAGNOSIS — R7401 Elevation of levels of liver transaminase levels: Secondary | ICD-10-CM

## 2022-08-19 DIAGNOSIS — D5700 Hb-SS disease with crisis, unspecified: Secondary | ICD-10-CM | POA: Diagnosis not present

## 2022-08-19 LAB — CREATININE, SERUM
Creatinine, Ser: 0.59 mg/dL — ABNORMAL LOW (ref 0.61–1.24)
GFR, Estimated: >60 mL/min (ref >60)

## 2022-08-19 MED ORDER — AMOXICILLIN-POT CLAVULANATE 875-125 MG PO TABS: 1.0000 | ORAL_TABLET | Freq: Two times a day (BID) | ORAL | 0 refills | Status: AC

## 2022-08-19 MED ORDER — GUAIFENESIN-DM 100-10 MG/5ML PO SYRP: 10.0000 mL | ORAL_SOLUTION | ORAL | 0 refills | Status: DC | PRN

## 2022-08-19 NOTE — Discharge Summary (Signed)
Physician Discharge Summary  Jonathan [name] ZOX:[id number] DOB: 08/16/2001 DOA: 08/11/2022  PCP: Quentin Angst, MD  Admit date: 08/11/2022  Discharge date: 08/19/2022  Discharge Diagnoses:  Principal Problem:   Sickle cell pain crisis (HCC) Active Problems:   Sickle cell crisis acute chest syndrome (HCC)   Influenza A   Mild intermittent asthma without complication   Anemia of chronic disease   Chronic pain   Discharge Condition: Stable  Disposition:   Follow-up Information     Quentin Angst, MD. Schedule an appointment as soon as possible for a visit in 1 week(s).   Specialty: Internal Medicine Contact information: [address] [name] [address] [phone]                Pt is discharged home in good condition and is to follow up with Quentin Angst, MD this week to have labs evaluated. Jonathan [name] is instructed to increase activity slowly and balance with rest for the next few days, and use prescribed medication to complete treatment of pain  Diet: Regular Wt Readings from Last 3 Encounters:  08/13/22 65.3 kg  06/11/22 68 kg  05/07/22 69 kg    History of present illness:  Jonathan [name] is a 21 y.o. male with medical history significant for sickle cell disease, chronic pain syndrome with opioid tolerance, asthma, anemia of chronic disease who presented to [facility] ED with complaints of hurting all over.  Denies subjective fevers or chills.  Also endorses a cough for the past few days associated with diarrhea and vomiting.   In the ED workup revealed elevated D-dimer greater than 9.  CT angio chest was negative for pulmonary embolism however it showed multiple bilateral lower lobe predominant patchy lung opacities concerning for pneumonia right worse than left.  Differential also includes venoocclusive disease in the patient with sickle cell disease. The patient was started on broad-spectrum antibiotics for CAP as well as IV fluid  and opioid-based analgesics.  Admitted by [facility], hospitalist service.   ED Course: Tmax 98.4.  BP 132/72, pulse 128, respiratory 21, with saturation 100% on 6 L.  Lab studies significant for WBC 16.2, hemoglobin 8.8, MCV 73.5.  Hospital Course:  Patient was admitted for sickle cell pain crisis possibly triggered by pneumonia, sepsis secondary to community-acquired pneumonia and acute hypoxic respiratory failure secondary to community-acquired pneumonia.  Patient was managed appropriately with IVF, IV antibiotics, IV Dilaudid via PCA and IV Toradol for total of 5 days, as well as other adjunct therapies per sickle cell pain management protocols.  Patient required very high level of oxygen to maintain her saturation above 90% on admission, he required as much as 6 L of oxygen by nasal cannula, and his diagnosis was changed to acute chest syndrome and managed accordingly.  He had simple exchange transfusion x 2 episodes with good response and oxygen need and pain crisis.  He also tested positive for influenza A and was treated with Tamiflu for 5 days.  His blood pressure was initially markedly elevated as high as 160/102 but resolved when pain control was optimal.  Oxygen was slowly weaned down, pain slowly returned to baseline after prolonged hospital stay.  He started to saturate above 92% on 2 L of oxygen, completed a course of Tamiflu.  LDH trended down.  Cough and shortness of breath slowly improved.  Patient began to ambulate well with no significant pain, and tolerating p.o. intake with no restrictions.  Leukocytosis improved.  He  was successfully weaned off PCA and placed on his oral home pain medications with no rebound pain crisis.  As at today, patient feels he has improved significantly to a level where he wants to go home.  He requested to be discharged home.  His vital signs are within normal limits except for heart rate that is still elevated when he gets up from a lying position. However he is  saturating well on room air. Patient was therefore discharged home today in a hemodynamically stable condition.  Patient was prescribed few more days of oral antibiotics to complete the regimen, and Robitussin cough expectorants.    [place] will follow-up with PCP within 1 week of this discharge. Jonathan [name] was counseled extensively about nonpharmacologic means of pain management, patient verbalized understanding and was appreciative of  the care received during this admission.   We discussed the need for good hydration, monitoring of hydration status, avoidance of heat, cold, stress, and infection triggers. We discussed the need to be adherent with taking Hydrea and other home medications. Patient was reminded of the need to seek medical attention immediately if any symptom of bleeding, anemia, or infection occurs.  Discharge Exam: Vitals:   08/19/22 1028 08/19/22 1051  BP: (!) 134/93   Pulse: (!) 117   Resp: 16 17  Temp: 99.1 F (37.3 C)   SpO2: 100%    Vitals:   08/19/22 0622 08/19/22 0837 08/19/22 1028 08/19/22 1051  BP: 124/79  (!) 134/93   Pulse: 100  (!) 117   Resp: 17 20 16 17   Temp: 99.2 F (37.3 C)  99.1 F (37.3 C)   TempSrc: Oral  Oral   SpO2: 94%  100%   Weight:      Height:        General appearance : Awake, alert, not in any distress. Speech Clear. Not toxic looking HEENT: Atraumatic and Normocephalic, pupils equally reactive to light and accomodation Neck: Supple, no JVD. No cervical lymphadenopathy.  Chest: Good air entry bilaterally, no added sounds  CVS: S1 S2 regular, no murmurs.  Abdomen: Bowel sounds present, Non tender and not distended with no gaurding, rigidity or rebound. Extremities: B/L Lower Ext shows no edema, both legs are warm to touch Neurology: Awake alert, and oriented X 3, CN II-XII intact, Non focal Skin: No Rash  Discharge Instructions  Discharge Instructions     Diet - low sodium heart healthy   Complete by: As directed    Increase  activity slowly   Complete by: As directed       Allergies as of 08/19/2022       Reactions   Allergy Medication [diphenhydramine] Swelling, Other (See Comments)   Face swells.         Medication List     TAKE these medications    amoxicillin-clavulanate 875-125 MG tablet Commonly known as: AUGMENTIN Take 1 tablet by mouth every 12 (twelve) hours for 5 days.   guaiFENesin-dextromethorphan 100-10 MG/5ML syrup Commonly known as: ROBITUSSIN DM Take 10 mLs by mouth every 4 (four) hours as needed for cough (chest congestion).   ibuprofen 800 MG tablet Commonly known as: ADVIL Take 800 mg by mouth every 6 (six) hours as needed for mild pain.   methocarbamol 500 MG tablet Commonly known as: ROBAXIN Take 1 tablet (500 mg total) by mouth every 8 (eight) hours as needed for muscle spasms.   oseltamivir 75 MG capsule Commonly known as: TAMIFLU Take 1 capsule (75 mg total) by mouth every 12 (  twelve) hours. What changed:  when to take this additional instructions   Oxycodone HCl 10 MG Tabs Take 1 tablet (10 mg total) by mouth every 4 (four) hours as needed (pain).        The results of significant diagnostics from this hospitalization (including imaging, microbiology, ancillary and laboratory) are listed below for reference.    Significant Diagnostic Studies: CT Angio Chest PE W and/or Wo Contrast  Result Date: 08/11/2022 CLINICAL DATA:  Cough since Thursday. Seen at sickle cell clinic today. EXAM: CT ANGIOGRAPHY CHEST WITH CONTRAST TECHNIQUE: Multidetector CT imaging of the chest was performed using the standard protocol during bolus administration of intravenous contrast. Multiplanar CT image reconstructions and MIPs were obtained to evaluate the vascular anatomy. RADIATION DOSE REDUCTION: This exam was performed according to the departmental dose-optimization program which includes automated exposure control, adjustment of the mA and/or kV according to patient size and/or  use of iterative reconstruction technique. CONTRAST:  23mL OMNIPAQUE IOHEXOL 350 MG/ML SOLN COMPARISON:  None Available. FINDINGS: Cardiovascular: No evidence of pulmonary embolism. Evaluation of distal segmental and subsegmental pulmonary arteries is somewhat limited due to respiratory motion. Mediastinum/Nodes: No enlarged mediastinal, hilar, or axillary lymph nodes. Thyroid gland, trachea, and esophagus demonstrate no significant findings. Lungs/Pleura: Multiple bilateral lower lobe predominant patchy lung opacities concerning for pneumonia, right worse than the left. Differential also includes veno-occlusive disease in this patient with sickle cell disease. No pleural effusion or pneumothorax. Upper Abdomen: No acute abnormality. 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 Musculoskeletal: Generalized sclerosis and heterogeneous marrow signal of the thoracic spine with Lincoln log appearance of the vertebral bodies consistent with sickle cell disease. Review of the MIP images confirms the above findings. IMPRESSION: 1. No evidence of pulmonary embolism. Evaluation of distal segmental and subsegmental pulmonary arteries is somewhat limited due to respiratory motion. 2. Multiple bilateral lower lobe predominant patchy lung opacities concerning for pneumonia, right worse than left. Differential also includes veno-occlusive disease in this patient with sickle cell disease. 3. Generalized sclerosis and heterogeneous marrow signal of the thoracic spine with Lincoln log appearance of the vertebral bodies consistent with sickle cell disease. 4. 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 Electronically Signed   By: Larose Hires D.O.   On: 08/11/2022 23:44   DG Chest 2 View  Result Date: 08/11/2022 CLINICAL DATA:  Shortness of breath, sickle cell crisis, nausea, vomiting, and diarrhea. EXAM: CHEST - 2 VIEW COMPARISON:  06/11/2022. FINDINGS: The heart is  enlarged and the mediastinal contour stable. The pulmonary vasculature is distended. Mild airspace disease is noted in the perihilar region on the right. No effusion or pneumothorax. No acute osseous abnormality. IMPRESSION: 1. Cardiomegaly with mildly distended pulmonary vasculature. 2. Mild airspace disease in the perihilar region on the right, possible edema or developing pneumonia. Electronically Signed   By: Thornell Sartorius M.D.   On: 08/11/2022 21:49    Microbiology: Recent Results (from the past 240 hour(s))  Resp panel by RT-PCR (RSV, Flu A&B, Covid) Anterior Nasal Swab     Status: Abnormal   Collection Time: 08/11/22  8:35 PM   Specimen: Anterior Nasal Swab  Result Value Ref Range Status   SARS Coronavirus 2 by RT PCR NEGATIVE NEGATIVE Final    Comment: (NOTE) SARS-CoV-2 target nucleic acids are NOT DETECTED.  The SARS-CoV-2 RNA is generally detectable in upper respiratory specimens during the acute phase of infection. The lowest concentration of SARS-CoV-2 viral copies this assay can detect  is 138 copies/mL. A negative result does not preclude SARS-Cov-2 infection and should not be used as the sole basis for treatment or other patient management decisions. A negative result may occur with  improper specimen collection/handling, submission of specimen other than nasopharyngeal swab, presence of viral mutation(s) within the areas targeted by this assay, and inadequate number of viral copies(<138 copies/mL). A negative result must be combined with clinical observations, patient history, and epidemiological information. The expected result is Negative.  Fact Sheet for Patients:  BloggerCourse.comhttps://www.fda.gov/media/152166/download  Fact Sheet for Healthcare Providers:  SeriousBroker.ithttps://www.fda.gov/media/152162/download  This test is no t yet approved or cleared by the [place] FDA and  has been authorized for detection and/or diagnosis of SARS-CoV-2 by FDA under an Emergency Use Authorization  (EUA). This EUA will remain  in effect (meaning this test can be used) for the duration of the COVID-19 declaration under Section 564(b)(1) of the Act, 21 U.S.C.section 360bbb-3(b)(1), unless the authorization is terminated  or revoked sooner.       Influenza A by PCR POSITIVE (A) NEGATIVE Final   Influenza B by PCR NEGATIVE NEGATIVE Final    Comment: (NOTE) The Xpert Xpress SARS-CoV-2/FLU/RSV plus assay is intended as an aid in the diagnosis of influenza from Nasopharyngeal swab specimens and should not be used as a sole basis for treatment. Nasal washings and aspirates are unacceptable for Xpert Xpress SARS-CoV-2/FLU/RSV testing.  Fact Sheet for Patients: BloggerCourse.comhttps://www.fda.gov/media/152166/download  Fact Sheet for Healthcare Providers: SeriousBroker.ithttps://www.fda.gov/media/152162/download  This test is not yet approved or cleared by the [place] FDA and has been authorized for detection and/or diagnosis of SARS-CoV-2 by FDA under an Emergency Use Authorization (EUA). This EUA will remain in effect (meaning this test can be used) for the duration of the COVID-19 declaration under Section 564(b)(1) of the Act, 21 U.S.C. section 360bbb-3(b)(1), unless the authorization is terminated or revoked.     Resp Syncytial Virus by PCR NEGATIVE NEGATIVE Final    Comment: (NOTE) Fact Sheet for Patients: BloggerCourse.comhttps://www.fda.gov/media/152166/download  Fact Sheet for Healthcare Providers: SeriousBroker.ithttps://www.fda.gov/media/152162/download  This test is not yet approved or cleared by the [place] FDA and has been authorized for detection and/or diagnosis of SARS-CoV-2 by FDA under an Emergency Use Authorization (EUA). This EUA will remain in effect (meaning this test can be used) for the duration of the COVID-19 declaration under Section 564(b)(1) of the Act, 21 U.S.C. section 360bbb-3(b)(1), unless the authorization is terminated or revoked.  Performed at [facility], [address] [place], [place] [address]   MRSA Next Gen by PCR, Nasal     Status: None   Collection Time: 08/13/22  9:48 PM   Specimen: Nasal Swab  Result Value Ref Range Status   MRSA by PCR Next Gen NOT DETECTED NOT DETECTED Final    Comment: (NOTE) The GeneXpert MRSA Assay (FDA approved for NASAL specimens only), is one component of a comprehensive MRSA colonization surveillance program. It is not intended to diagnose MRSA infection nor to guide or monitor treatment for MRSA infections. Test performance is not FDA approved in patients less than 23 years old. Performed at [facility], [address] [place], [place] [address]      Labs: Basic Metabolic Panel: Recent Labs  Lab 08/14/22 0702 08/16/22 0315 08/18/22 1318 08/19/22 0550  NA 135 137 136  --   K 4.3 4.1 4.4  --   CL 98 103 99  --   CO2 28 25 26   --   GLUCOSE  112* 100* 116*  --   BUN 9 11 13   --   CREATININE 0.54* 0.61 0.68 0.59*  CALCIUM 8.6* 8.4* 9.1  --    Liver Function Tests: Recent Labs  Lab 08/14/22 0702 08/18/22 1318  AST 52* 19  ALT 11 11  ALKPHOS 268* 249*  BILITOT 6.0* 2.4*  PROT 6.5 8.0  ALBUMIN 2.8* 3.0*   No results for input(s): "LIPASE", "AMYLASE" in the last 168 hours. No results for input(s): "AMMONIA" in the last 168 hours. CBC: Recent Labs  Lab 08/12/22 1659 08/13/22 0742 08/14/22 0702 08/15/22 0413 08/16/22 0315 08/17/22 0627 08/18/22 1318  WBC 15.9*   < > 15.3* 11.1* 10.9* 12.4* 14.3*  NEUTROABS 10.4*  --   --   --   --   --   --   HGB 8.1*   < > 8.6* 7.9* 7.7* 7.2* 8.4*  HCT 23.8*   < > 25.9* 24.1* 24.0* 22.6* 25.9*  MCV 72.6*   < > 77.1* 77.7* 79.7* 77.9* 78.5*  PLT 207   < > 164 201 219 289 399   < > = values in this interval not displayed.   Cardiac Enzymes: No results for input(s): "CKTOTAL", "CKMB", "CKMBINDEX", "TROPONINI" in the last 168 hours. BNP: Invalid input(s): "POCBNP" CBG: No results for input(s): "GLUCAP" in the last 168  hours.  Time coordinating discharge: 50 minutes  Signed:  Mallorey Odonell  [facility] 08/19/2022, 12:16 PM

## 2022-08-19 NOTE — Progress Notes (Signed)
Discharged patient via wheelchair to home.  Reviewed patient medications and follow up appointments.  Patient had no further questions.

## 2022-08-19 NOTE — Progress Notes (Signed)
  Transition of Care (TOC) Screening Note   Patient Details  Name: Isaia Hassell Wortmann Date of Birth: 2000-11-23   Transition of Care [facility]) CM/SW Contact:    Lanier Clam, RN Phone Number: 08/19/2022, 12:21 PM    Transition of Care Department [facility]) has reviewed patient and no TOC needs have been identified at this time. We will continue to monitor patient advancement through interdisciplinary progression rounds. If new patient transition needs arise, please place a TOC consult.

## 2022-08-19 NOTE — Plan of Care (Signed)
  Problem: Education: Goal: Knowledge of vaso-occlusive preventative measures will improve Outcome: Progressing Goal: Awareness of infection prevention will improve Outcome: Progressing Goal: Awareness of signs and symptoms of anemia will improve Outcome: Progressing Goal: Long-term complications will improve Outcome: Progressing   Problem: Self-Care: Goal: Ability to incorporate actions that prevent/reduce pain crisis will improve Outcome: Progressing   Problem: Bowel/Gastric: Goal: Gut motility will be maintained Outcome: Progressing   Problem: Tissue Perfusion: Goal: Complications related to inadequate tissue perfusion will be avoided or minimized Outcome: Progressing   Problem: Respiratory: Goal: Pulmonary complications will be avoided or minimized Outcome: Progressing Goal: Acute Chest Syndrome will be identified early to prevent complications Outcome: Progressing   Problem: Fluid Volume: Goal: Ability to maintain a balanced intake and output will improve Outcome: Progressing   Problem: Sensory: Goal: Pain level will decrease with appropriate interventions Outcome: Progressing   Problem: Health Behavior: Goal: Postive changes in compliance with treatment and prescription regimens will improve Outcome: Progressing   Problem: Education: Goal: Knowledge of General Education information will improve Description: Including pain rating scale, medication(s)/side effects and non-pharmacologic comfort measures Outcome: Progressing   Problem: Health Behavior/Discharge Planning: Goal: Ability to manage health-related needs will improve Outcome: Progressing   Problem: Clinical Measurements: Goal: Ability to maintain clinical measurements within normal limits will improve Outcome: Progressing Goal: Will remain free from infection Outcome: Progressing Goal: Diagnostic test results will improve Outcome: Progressing Goal: Respiratory complications will improve Outcome:  Progressing Goal: Cardiovascular complication will be avoided Outcome: Progressing   Problem: Nutrition: Goal: Adequate nutrition will be maintained Outcome: Progressing   Problem: Coping: Goal: Level of anxiety will decrease Outcome: Progressing   Problem: Elimination: Goal: Will not experience complications related to bowel motility Outcome: Progressing Goal: Will not experience complications related to urinary retention Outcome: Progressing   Problem: Pain Managment: Goal: General experience of comfort will improve Outcome: Progressing   Problem: Safety: Goal: Ability to remain free from injury will improve Outcome: Progressing   Problem: Skin Integrity: Goal: Risk for impaired skin integrity will decrease Outcome: Progressing

## 2022-08-27 ENCOUNTER — Inpatient Hospital Stay: Payer: Self-pay | Admitting: Nurse Practitioner

## 2022-09-02 ENCOUNTER — Encounter: Payer: Self-pay | Admitting: Family Medicine

## 2022-09-02 ENCOUNTER — Ambulatory Visit: Payer: Commercial Managed Care - PPO | Admitting: Family Medicine

## 2022-09-02 VITALS — BP 125/72 | HR 100 | Temp 98.2°F | Ht 65.0 in | Wt 137.2 lb

## 2022-09-02 DIAGNOSIS — E559 Vitamin D deficiency, unspecified: Secondary | ICD-10-CM

## 2022-09-02 DIAGNOSIS — D571 Sickle-cell disease without crisis: Secondary | ICD-10-CM

## 2022-09-02 DIAGNOSIS — Z79891 Long term (current) use of opiate analgesic: Secondary | ICD-10-CM | POA: Diagnosis not present

## 2022-09-02 NOTE — Progress Notes (Signed)
Established Patient Office Visit  Subjective   Patient ID: Jonathan [name], male    DOB: [birth year]  Age: 22 y.o. MRN: [id number]  Chief Complaint  Patient presents with   Hospitalization Follow-up    Per pt he is fine legs just sore    Jonathan [name] is a 22 year old male with a medical history significant for sickle cell disease, chronic pain, opiate dependence, mild intermittent asthma, and history of anemia of chronic disease presents for posthospital follow-up.  Patient was admitted to [facility] long hospital from 08/11/2022 - 08/19/2022 for influenza, acute chest syndrome in the setting of sickle cell pain crisis. While in hospital, patient underwent simple exchange transfusion and fluid resuscitation.  Also, patient completed Tamiflu.  Patient states that he is having some allover soreness, but no pain that is consistent with his typical sickle cell pain fever, chills, cough, shortness of breath, or chest pain    Patient Active Problem List   Diagnosis Date Noted   Elevated AST (SGOT) 08/19/2022   Elevated random blood glucose level 08/19/2022   Chronic pain 06/11/2022   Community acquired pneumonia 02/12/2022   Pulmonary nodule 02/12/2022   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HCC) 09/02/2021   Sickle cell crisis (HCC) 07/27/2021   COVID-19 virus infection 07/27/2021   Anemia of chronic disease 06/27/2021   Thrombocytosis 06/27/2021   Mild intermittent asthma without complication 11/11/2020   Sickle cell disease with crisis (HCC) 10/23/2020   Sickle cell pain crisis (HCC) 08/16/2020   Proteinuria 04/14/2020   Influenza 10/13/2017   Sickle cell disease (HCC) 04/27/2017   Failed hearing screening 03/25/2016   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   Myopic astigmatism 03/16/2015   Avascular necrosis of bone of left hip (HCC) 03/24/2013   Chronic obstruct airways disease (HCC) 03/15/2013   Snoring 04/08/2012   Hypertension 11/11/2011   Reactive airway disease 11/09/2011    Sickle cell crisis acute chest syndrome (HCC) 07/30/2011   Past Medical History:  Diagnosis Date   Acute chest syndrome(517.3) 08/26/2011   march 2013, Sept 2013   Asthma    Avascular necrosis of bone of left hip (HCC)    Sickle cell disease, type SS (HCC)    Past Surgical History:  Procedure Laterality Date   ADENOIDECTOMY     SPLENECTOMY     TONSILLECTOMY     TONSILLECTOMY AND ADENOIDECTOMY     TOTAL HIP ARTHROPLASTY Left 03/07/2016   UMBILICAL HERNIA REPAIR     Social History   Tobacco Use   Smoking status: Never   Smokeless tobacco: Never  Vaping Use   Vaping Use: Never used  Substance Use Topics   Alcohol use: No    Alcohol/week: 0.0 standard drinks of alcohol   Drug use: No   Social History   Socioeconomic History   Marital status: Single    Spouse name: Not on file   Number of children: Not on file   Years of education: Not on file   Highest education level: Not on file  Occupational History   Not on file  Tobacco Use   Smoking status: Never   Smokeless tobacco: Never  Vaping Use   Vaping Use: Never used  Substance and Sexual Activity   Alcohol use: No    Alcohol/week: 0.0 standard drinks of alcohol   Drug use: No   Sexual activity: Never  Other Topics Concern   Not on file  Social History Narrative   Lives with mother.  No smokers  at home      Update:       05/02/16 Lives with Mother only   Social Determinants of Health   Financial Resource Strain: Not on file  Food Insecurity: No Food Insecurity (08/12/2022)   Hunger Vital Sign    Worried About Running Out of Food in the Last Year: Never true    Ran Out of Food in the Last Year: Never true  Transportation Needs: Unmet Transportation Needs (08/12/2022)   PRAPARE - Hydrologist (Medical): Yes    Lack of Transportation (Non-Medical): Yes  Physical Activity: Not on file  Stress: Not on file  Social Connections: Not on file  Intimate Partner Violence: Not At  Risk (08/12/2022)   Humiliation, Afraid, Rape, and Kick questionnaire    Fear of Current or Ex-Partner: No    Emotionally Abused: No    Physically Abused: No    Sexually Abused: No   Family Status  Relation Name Status   Mother  (Not Specified)   MGM  (Not Specified)   MGF  (Not Specified)   PGF  (Not Specified)   Family History  Problem Relation Age of Onset   Hypertension Mother    Hypertension Maternal Grandmother    Hypertension Maternal Grandfather    Asthma Paternal Grandfather    Diabetes Paternal Grandfather    Allergies  Allergen Reactions   Allergy Medication [Diphenhydramine] Swelling and Other (See Comments)    Face swells.       Review of Systems  Constitutional: Negative.   HENT: Negative.    Respiratory: Negative.    Cardiovascular: Negative.   Gastrointestinal: Negative.   Genitourinary: Negative.   Musculoskeletal:  Positive for back pain and joint pain.  Skin: Negative.   Neurological: Negative.   Psychiatric/Behavioral: Negative.        Objective:     BP 125/72   Pulse 100   Temp 98.2 F (36.8 C)   Ht 5\' 5"  (1.651 m)   Wt 137 lb 3.2 oz (62.2 kg)   SpO2 100%   BMI 22.83 kg/m  BP Readings from Last 3 Encounters:  09/18/22 130/75  09/02/22 125/72  08/19/22 130/82   Wt Readings from Last 3 Encounters:  09/02/22 137 lb 3.2 oz (62.2 kg)  08/13/22 143 lb 15.4 oz (65.3 kg)  06/11/22 150 lb (68 kg)      Physical Exam Constitutional:      Appearance: Normal appearance.  Eyes:     Pupils: Pupils are equal, round, and reactive to light.  Cardiovascular:     Rate and Rhythm: Normal rate and regular rhythm.     Pulses: Normal pulses.  Pulmonary:     Effort: Pulmonary effort is normal.  Abdominal:     General: Bowel sounds are normal.  Skin:    General: Skin is warm.  Neurological:     General: No focal deficit present.     Mental Status: He is alert. Mental status is at baseline.  Psychiatric:        Mood and Affect: Mood  normal.        Behavior: Behavior normal.        Thought Content: Thought content normal.        Judgment: Judgment normal.      Results for orders placed or performed in visit on 09/02/22  Sickle Cell Panel  Result Value Ref Range   Glucose 88 70 - 99 mg/dL   BUN 5 (L) 6 -  20 mg/dL   Creatinine, Ser 5.68 0.76 - 1.27 mg/dL   eGFR 127 >51 ZG/YFV/4.94   BUN/Creatinine Ratio 6 (L) 9 - 20   Sodium 144 134 - 144 mmol/L   Potassium 4.2 3.5 - 5.2 mmol/L   Chloride 106 96 - 106 mmol/L   CO2 24 20 - 29 mmol/L   Calcium 9.9 8.7 - 10.2 mg/dL   Total Protein 7.0 6.0 - 8.5 g/dL   Albumin 3.9 (L) 4.3 - 5.2 g/dL   Globulin, Total 3.1 1.5 - 4.5 g/dL   Albumin/Globulin Ratio 1.3 1.2 - 2.2   Bilirubin Total 0.6 0.0 - 1.2 mg/dL   Alkaline Phosphatase 285 (H) 44 - 121 IU/L   AST 15 0 - 40 IU/L   ALT 11 0 - 44 IU/L   Ferritin 1,102 (H) 30 - 400 ng/mL   Vit D, 25-Hydroxy 15.6 (L) 30.0 - 100.0 ng/mL   WBC 7.5 3.4 - 10.8 x10E3/uL   RBC 3.38 (L) 4.14 - 5.80 x10E6/uL   Hemoglobin 9.0 (L) 13.0 - 17.7 g/dL   Hematocrit 49.6 (L) 75.9 - 51.0 %   MCV 89 79 - 97 fL   MCH 26.6 26.6 - 33.0 pg   MCHC 30.0 (L) 31.5 - 35.7 g/dL   RDW 16.3 (H) 84.6 - 65.9 %   Platelets 656 (H) 150 - 450 x10E3/uL   Neutrophils 43 Not Estab. %   Lymphs 44 Not Estab. %   Monocytes 11 Not Estab. %   Eos 1 Not Estab. %   Basos 1 Not Estab. %   Neutrophils Absolute 3.2 1.4 - 7.0 x10E3/uL   Lymphocytes Absolute 3.2 (H) 0.7 - 3.1 x10E3/uL   Monocytes Absolute 0.8 0.1 - 0.9 x10E3/uL   EOS (ABSOLUTE) 0.1 0.0 - 0.4 x10E3/uL   Basophils Absolute 0.1 0.0 - 0.2 x10E3/uL   Immature Granulocytes 0 Not Estab. %   Immature Grans (Abs) 0.0 0.0 - 0.1 x10E3/uL   NRBC 5 (H) 0 - 0 %   Hematology Comments: Note:    Retic Ct Pct 7.9 (H) 0.6 - 2.6 %  [id number] 11+Oxyco+Alc+Crt-Bund  Result Value Ref Range   Ethanol Negative Cutoff=0.020 %   Amphetamines, Urine Negative Cutoff=1000 ng/mL   Barbiturate Negative Cutoff=200 ng/mL   BENZODIAZ  UR QL Negative Cutoff=200 ng/mL   Cannabinoid Quant, Ur See Final Results Cutoff=50 ng/mL   Cocaine (Metabolite) Negative Cutoff=300 ng/mL   OPIATE SCREEN URINE Negative Cutoff=300 ng/mL   Oxycodone/Oxymorphone, Urine Negative Cutoff=300 ng/mL   Phencyclidine Negative Cutoff=25 ng/mL   Methadone Screen, Urine Negative Cutoff=300 ng/mL   Propoxyphene Negative Cutoff=300 ng/mL   Meperidine Negative Cutoff=200 ng/mL   Tramadol Negative Cutoff=200 ng/mL   Creatinine 127.7 20.0 - 300.0 mg/dL   pH, Urine 5.3 4.5 - 8.9  Cannabinoid Conf, Ur  Result Value Ref Range   CANNABINOIDS Positive (A) Cutoff=50   Carboxy THC GC/MS Conf 184 Cutoff=15 ng/mL    Last CBC Lab Results  Component Value Date   WBC 10.5 09/18/2022   HGB 10.3 (L) 09/18/2022   HCT 31.9 (L) 09/18/2022   MCV 89.6 09/18/2022   MCH 28.9 09/18/2022   RDW 23.0 (H) 09/18/2022   PLT 303 09/18/2022   Last metabolic panel Lab Results  Component Value Date   GLUCOSE 97 09/18/2022   NA 140 09/18/2022   K 3.8 09/18/2022   CL 106 09/18/2022   CO2 25 09/18/2022   BUN 6 09/18/2022   CREATININE 0.70 09/18/2022   GFRNONAA >60 09/18/2022  CALCIUM 9.0 09/18/2022   PHOS 4.6 08/12/2022   PROT 6.6 09/18/2022   ALBUMIN 3.9 09/18/2022   LABGLOB 3.1 09/02/2022   AGRATIO 1.3 09/02/2022   BILITOT 1.1 09/18/2022   ALKPHOS 181 (H) 09/18/2022   AST 24 09/18/2022   ALT 12 09/18/2022   ANIONGAP 9 09/18/2022   Last lipids Lab Results  Component Value Date   TRIG 44 10/23/2020   Last hemoglobin A1c No results found for: "HGBA1C" Last thyroid functions No results found for: "TSH", "T3TOTAL", "T4TOTAL", "THYROIDAB" Last vitamin D Lab Results  Component Value Date   VD25OH 15.6 (L) 09/02/2022   Last vitamin B12 and Folate No results found for: "VITAMINB12", "FOLATE"    The ASCVD Risk score (Arnett DK, et al., 2019) failed to calculate for the following reasons:   The 2019 ASCVD risk score is only valid for ages 63 to 62     Assessment & Plan:   Problem List Items Addressed This Visit   None Visit Diagnoses     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 We discussed the need for good hydration, monitoring of hydration status, avoidance of heat, cold, stress, and infection triggers. We discussed the risks and benefits of Hydrea, including bone marrow suppression, the possibility of GI upset, skin ulcers, hair thinning, and teratogenicity. The patient was reminded of the need to seek medical attention of any symptoms of bleeding, anemia, or infection. Continue folic acid 1 mg daily to prevent aplastic bone marrow crises.  Patient will also continue monthly Adakveo infusions  Pulmonary evaluation - Patient denies severe recurrent wheezes, shortness of breath with exercise, or persistent cough. If these symptoms develop, pulmonary function tests with spirometry will be ordered, and if abnormal, plan on referral to Pulmonology for further evaluation.  Cardiac - Routine screening for pulmonary hypertension is not recommended.  Eye - High risk of proliferative retinopathy. Annual eye exam with retinal exam recommended to patient.  .   - Sickle Cell Panel  2. Vitamin D deficiency - Sickle Cell Panel  3. Chronic prescription opiate use  - [id number] 11+Oxyco+Alc+Crt-Bund  Return in about 3 months (around 12/02/2022) for sickle cell anemia.    [name]  APRN, MSN, FNP-C Patient Care [facility] [address] [place], [place] [address] [phone]

## 2022-09-03 LAB — CMP14+CBC/D/PLT+FER+RETIC+V...
ALT: 11 IU/L (ref 0–44)
AST: 15 IU/L (ref 0–40)
Albumin/Globulin Ratio: 1.3 (ref 1.2–2.2)
Albumin: 3.9 g/dL — ABNORMAL LOW (ref 4.3–5.2)
Alkaline Phosphatase: 285 IU/L — ABNORMAL HIGH (ref 44–121)
BUN/Creatinine Ratio: 6 — ABNORMAL LOW (ref 9–20)
BUN: 5 mg/dL — ABNORMAL LOW (ref 6–20)
Basophils Absolute: 0.1 10*3/uL (ref 0.0–0.2)
Basos: 1 %
Bilirubin Total: 0.6 mg/dL (ref 0.0–1.2)
CO2: 24 mmol/L (ref 20–29)
Calcium: 9.9 mg/dL (ref 8.7–10.2)
Chloride: 106 mmol/L (ref 96–106)
Creatinine, Ser: 0.8 mg/dL (ref 0.76–1.27)
EOS (ABSOLUTE): 0.1 10*3/uL (ref 0.0–0.4)
Eos: 1 %
Ferritin: 1102 ng/mL — ABNORMAL HIGH (ref 30–400)
Globulin, Total: 3.1 g/dL (ref 1.5–4.5)
Glucose: 88 mg/dL (ref 70–99)
Hematocrit: 30 % — ABNORMAL LOW (ref 37.5–51.0)
Hemoglobin: 9 g/dL — ABNORMAL LOW (ref 13.0–17.7)
Immature Grans (Abs): 0 10*3/uL (ref 0.0–0.1)
Immature Granulocytes: 0 %
Lymphocytes Absolute: 3.2 10*3/uL — ABNORMAL HIGH (ref 0.7–3.1)
Lymphs: 44 %
MCH: 26.6 pg (ref 26.6–33.0)
MCHC: 30 g/dL — ABNORMAL LOW (ref 31.5–35.7)
MCV: 89 fL (ref 79–97)
Monocytes Absolute: 0.8 10*3/uL (ref 0.1–0.9)
Monocytes: 11 %
NRBC: 5 % — ABNORMAL HIGH (ref 0–0)
Neutrophils Absolute: 3.2 10*3/uL (ref 1.4–7.0)
Neutrophils: 43 %
Platelets: 656 10*3/uL — ABNORMAL HIGH (ref 150–450)
Potassium: 4.2 mmol/L (ref 3.5–5.2)
RBC: 3.38 x10E6/uL — ABNORMAL LOW (ref 4.14–5.80)
RDW: 21.5 % — ABNORMAL HIGH (ref 11.6–15.4)
Retic Ct Pct: 7.9 % — ABNORMAL HIGH (ref 0.6–2.6)
Sodium: 144 mmol/L (ref 134–144)
Total Protein: 7 g/dL (ref 6.0–8.5)
Vit D, 25-Hydroxy: 15.6 ng/mL — ABNORMAL LOW (ref 30.0–100.0)
WBC: 7.5 10*3/uL (ref 3.4–10.8)
eGFR: 128 mL/min/{1.73_m2} (ref >59)

## 2022-09-04 ENCOUNTER — Encounter (HOSPITAL_COMMUNITY): Payer: Commercial Managed Care - PPO

## 2022-09-06 LAB — DRUG SCREEN 764883 11+OXYCO+ALC+CRT-BUND
Amphetamines, Urine: NEGATIVE ng/mL
BENZODIAZ UR QL: NEGATIVE ng/mL
Barbiturate: NEGATIVE ng/mL
Cocaine (Metabolite): NEGATIVE ng/mL
Creatinine: 127.7 mg/dL (ref 20.0–300.0)
Ethanol: NEGATIVE %
Meperidine: NEGATIVE ng/mL
Methadone Screen, Urine: NEGATIVE ng/mL
OPIATE SCREEN URINE: NEGATIVE ng/mL
Oxycodone/Oxymorphone, Urine: NEGATIVE ng/mL
Phencyclidine: NEGATIVE ng/mL
Propoxyphene: NEGATIVE ng/mL
Tramadol: NEGATIVE ng/mL
pH, Urine: 5.3 (ref 4.5–8.9)

## 2022-09-06 LAB — CANNABINOID CONFIRMATION, UR
CANNABINOIDS: POSITIVE — AB
Carboxy THC GC/MS Conf: 184 ng/mL

## 2022-09-10 ENCOUNTER — Other Ambulatory Visit: Payer: Self-pay | Admitting: Family Medicine

## 2022-09-10 DIAGNOSIS — E559 Vitamin D deficiency, unspecified: Secondary | ICD-10-CM

## 2022-09-10 MED ORDER — ERGOCALCIFEROL 1.25 MG (50000 UT) PO CAPS: 50000.0000 [IU] | ORAL_CAPSULE | ORAL | 11 refills | Status: DC

## 2022-09-10 NOTE — Progress Notes (Signed)
Jonathan [name] is a 22 year old male with a medical history significant for sickle cell disease, chronic pain syndrome, and vitamin D deficiency was evaluated previously.  Patient's vitamin D level continues to be decreased at 15.6.  Will continue weekly Drisdol 50,000 IUs.  Please discussed the importance of taking this medication consistently in order to achieve positive outcomes.  Also, recommend vitamin D fortified diet which includes salmon, mackerel, sardines, egg yolks, almond milk, and some breakfast cereals.  Will recheck in 3 months at follow-up appointment.  [name]  APRN, MSN, FNP-C Patient [facility] [address] [place], [place] [address] [phone]

## 2022-09-18 ENCOUNTER — Non-Acute Institutional Stay (HOSPITAL_COMMUNITY)
Admission: RE | Admit: 2022-09-18 | Discharge: 2022-09-18 | Disposition: A | Discharge status: Home / Self Care | Payer: Commercial Managed Care - PPO | Source: Ambulatory Visit | Attending: Internal Medicine | Admitting: Internal Medicine

## 2022-09-18 DIAGNOSIS — D571 Sickle-cell disease without crisis: Secondary | ICD-10-CM

## 2022-09-18 LAB — CBC WITH DIFFERENTIAL/PLATELET
Abs Immature Granulocytes: 0.07 10*3/uL (ref 0.00–0.07)
Basophils Absolute: 0.1 10*3/uL (ref 0.0–0.1)
Basophils Relative: 1 %
Eosinophils Absolute: 0.2 10*3/uL (ref 0.0–0.5)
Eosinophils Relative: 2 %
HCT: 31.9 % — ABNORMAL LOW (ref 39.0–52.0)
Hemoglobin: 10.3 g/dL — ABNORMAL LOW (ref 13.0–17.0)
Immature Granulocytes: 1 %
Lymphocytes Relative: 44 %
Lymphs Abs: 4.6 10*3/uL — ABNORMAL HIGH (ref 0.7–4.0)
MCH: 28.9 pg (ref 26.0–34.0)
MCHC: 32.3 g/dL (ref 30.0–36.0)
MCV: 89.6 fL (ref 80.0–100.0)
Monocytes Absolute: 1 10*3/uL (ref 0.1–1.0)
Monocytes Relative: 9 %
Neutro Abs: 4.6 10*3/uL (ref 1.7–7.7)
Neutrophils Relative %: 43 %
Platelets: 303 10*3/uL (ref 150–400)
RBC: 3.56 MIL/uL — ABNORMAL LOW (ref 4.22–5.81)
RDW: 23 % — ABNORMAL HIGH (ref 11.5–15.5)
WBC: 10.5 10*3/uL (ref 4.0–10.5)
nRBC: 2.5 % — ABNORMAL HIGH (ref 0.0–0.2)

## 2022-09-18 LAB — RETICULOCYTES
Immature Retic Fract: 54 % — ABNORMAL HIGH (ref 2.3–15.9)
RBC.: 3.56 MIL/uL — ABNORMAL LOW (ref 4.22–5.81)
Retic Count, Absolute: 183.7 10*3/uL (ref 19.0–186.0)
Retic Ct Pct: 5.2 % — ABNORMAL HIGH (ref 0.4–3.1)

## 2022-09-18 LAB — COMPREHENSIVE METABOLIC PANEL
ALT: 12 U/L (ref 0–44)
AST: 24 U/L (ref 15–41)
Albumin: 3.9 g/dL (ref 3.5–5.0)
Alkaline Phosphatase: 181 U/L — ABNORMAL HIGH (ref 38–126)
Anion gap: 9 (ref 5–15)
BUN: 6 mg/dL (ref 6–20)
CO2: 25 mmol/L (ref 22–32)
Calcium: 9 mg/dL (ref 8.9–10.3)
Chloride: 106 mmol/L (ref 98–111)
Creatinine, Ser: 0.7 mg/dL (ref 0.61–1.24)
GFR, Estimated: >60 mL/min (ref >60)
Glucose, Bld: 97 mg/dL (ref 70–99)
Potassium: 3.8 mmol/L (ref 3.5–5.1)
Sodium: 140 mmol/L (ref 135–145)
Total Bilirubin: 1.1 mg/dL (ref 0.3–1.2)
Total Protein: 6.6 g/dL (ref 6.5–8.1)

## 2022-09-18 MED ORDER — SODIUM CHLORIDE 0.9 % IV SOLN
5.0000 mg/kg | Freq: Once | INTRAVENOUS | Status: AC
  Administered 2022-09-18: 340 mg via INTRAVENOUS
  Filled 2022-09-18: qty 34

## 2022-09-18 MED ORDER — SODIUM CHLORIDE 0.9 % IV SOLN: INTRAVENOUS | Status: DC | PRN

## 2022-09-18 NOTE — Progress Notes (Signed)
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 Labs drawn (CBC w/diff, CMP, Retic) prior to infusion. Patient tolerated infusion well with no adverse reaction. Line flushed with 25 cc NS post infusion per protocol. Vital signs stable. AVS offered but patient refused. Patient to come back every month for infusion and will call back to schedule next appointment.  Patient alert, oriented and ambulatory at discharge.

## 2022-10-14 ENCOUNTER — Telehealth: Payer: Self-pay | Admitting: Internal Medicine

## 2022-10-14 ENCOUNTER — Other Ambulatory Visit: Payer: Self-pay | Admitting: Family Medicine

## 2022-10-14 DIAGNOSIS — Z79891 Long term (current) use of opiate analgesic: Secondary | ICD-10-CM

## 2022-10-14 DIAGNOSIS — D571 Sickle-cell disease without crisis: Secondary | ICD-10-CM

## 2022-10-14 MED ORDER — IBUPROFEN 800 MG PO TABS: 800.0000 mg | ORAL_TABLET | Freq: Four times a day (QID) | ORAL | 5 refills | Status: DC | PRN

## 2022-10-14 MED ORDER — OXYCODONE HCL 10 MG PO TABS: 10.0000 mg | ORAL_TABLET | ORAL | 0 refills | Status: DC | PRN

## 2022-10-14 NOTE — Progress Notes (Signed)
Reviewed PDMP substance reporting system prior to prescribing opiate medications. No inconsistencies noted.  Meds ordered this encounter  Medications   ibuprofen (ADVIL) 800 MG tablet    Sig: Take 1 tablet (800 mg total) by mouth every 6 (six) hours as needed for mild pain.    Dispense:  30 tablet    Refill:  5    Order Specific Question:   Supervising Provider    Answer:   Tresa Garter [id number]   Oxycodone HCl 10 MG TABS    Sig: Take 1 tablet (10 mg total) by mouth every 4 (four) hours as needed (pain).    Dispense:  60 tablet    Refill:  0    Order Specific Question:   Supervising Provider    Answer:   Tresa Garter [id number]   Jonathan Pounds  APRN, MSN, FNP-C Patient Jonathan [name] [address] [place], [place] [address] [phone]

## 2022-10-14 NOTE — Telephone Encounter (Signed)
Caller & Relationship to patient:  MRN #  [id number]   Call Back Number:   Date of Last Office Visit: 09/02/2022     Date of Next Office Visit: 12/02/2022    Medication(s) to be Refilled: Oxycodone and Ibuprofen   Preferred Pharmacy:   ** Please notify patient to allow 48-72 hours to process** **Let patient know to contact pharmacy at the end of the day to make sure medication is ready. ** **If patient has not been seen in a year or longer, book an appointment **Advise to use MyChart for refill requests OR to contact their pharmacy

## 2022-10-30 ENCOUNTER — Non-Acute Institutional Stay (HOSPITAL_COMMUNITY)
Admission: RE | Admit: 2022-10-30 | Discharge: 2022-10-30 | Disposition: A | Discharge status: Home / Self Care | Payer: Commercial Managed Care - PPO | Source: Ambulatory Visit | Attending: Internal Medicine | Admitting: Internal Medicine

## 2022-10-30 VITALS — BP 119/52 | HR 77 | Temp 98.8°F | Resp 16 | Wt 151.0 lb

## 2022-10-30 DIAGNOSIS — D571 Sickle-cell disease without crisis: Secondary | ICD-10-CM

## 2022-10-30 LAB — COMPREHENSIVE METABOLIC PANEL
ALT: 9 U/L (ref 0–44)
AST: 16 U/L (ref 15–41)
Albumin: 4.2 g/dL (ref 3.5–5.0)
Alkaline Phosphatase: 123 U/L (ref 38–126)
Anion gap: 10 (ref 5–15)
BUN: 8 mg/dL (ref 6–20)
CO2: 24 mmol/L (ref 22–32)
Calcium: 9.2 mg/dL (ref 8.9–10.3)
Chloride: 105 mmol/L (ref 98–111)
Creatinine, Ser: 0.65 mg/dL (ref 0.61–1.24)
GFR, Estimated: >60 mL/min (ref >60)
Glucose, Bld: 101 mg/dL — ABNORMAL HIGH (ref 70–99)
Potassium: 3.8 mmol/L (ref 3.5–5.1)
Sodium: 139 mmol/L (ref 135–145)
Total Bilirubin: 1.1 mg/dL (ref 0.3–1.2)
Total Protein: 7.1 g/dL (ref 6.5–8.1)

## 2022-10-30 LAB — CBC WITH DIFFERENTIAL/PLATELET
Abs Immature Granulocytes: 0.04 10*3/uL (ref 0.00–0.07)
Basophils Absolute: 0.1 10*3/uL (ref 0.0–0.1)
Basophils Relative: 1 %
Eosinophils Absolute: 0.3 10*3/uL (ref 0.0–0.5)
Eosinophils Relative: 3 %
HCT: 25.6 % — ABNORMAL LOW (ref 39.0–52.0)
Hemoglobin: 8.5 g/dL — ABNORMAL LOW (ref 13.0–17.0)
Immature Granulocytes: 0 %
Lymphocytes Relative: 23 %
Lymphs Abs: 2.4 10*3/uL (ref 0.7–4.0)
MCH: 31 pg (ref 26.0–34.0)
MCHC: 33.2 g/dL (ref 30.0–36.0)
MCV: 93.4 fL (ref 80.0–100.0)
Monocytes Absolute: 0.9 10*3/uL (ref 0.1–1.0)
Monocytes Relative: 9 %
Neutro Abs: 6.8 10*3/uL (ref 1.7–7.7)
Neutrophils Relative %: 64 %
Platelets: 355 10*3/uL (ref 150–400)
RBC: 2.74 MIL/uL — ABNORMAL LOW (ref 4.22–5.81)
RDW: 19.8 % — ABNORMAL HIGH (ref 11.5–15.5)
WBC: 10.5 10*3/uL (ref 4.0–10.5)
nRBC: 1 % — ABNORMAL HIGH (ref 0.0–0.2)

## 2022-10-30 MED ORDER — SODIUM CHLORIDE 0.9 % IV SOLN: INTRAVENOUS | Status: DC | PRN

## 2022-10-30 MED ORDER — SODIUM CHLORIDE 0.9 % IV SOLN
5.0000 mg/kg | Freq: Once | INTRAVENOUS | Status: AC
  Administered 2022-10-30: 343 mg via INTRAVENOUS
  Filled 2022-10-30: qty 34.3

## 2022-10-30 NOTE — Progress Notes (Signed)
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 Labs drawn (CBC w/diff, CMP) prior to infusion. Patient tolerated infusion well with no adverse reaction. Line flushed with 25 cc NS post infusion per protocol. Vital signs stable. AVS offered but patient refused. Patient to come back every month for infusion.  Patient alert, oriented and ambulatory at discharge.

## 2022-11-11 ENCOUNTER — Other Ambulatory Visit: Payer: Self-pay

## 2022-11-11 ENCOUNTER — Emergency Department (HOSPITAL_COMMUNITY)
Admission: EM | Admit: 2022-11-11 | Discharge: 2022-11-11 | Disposition: A | Discharge status: Home / Self Care | Payer: Commercial Managed Care - PPO | Attending: Emergency Medicine | Admitting: Emergency Medicine

## 2022-11-11 ENCOUNTER — Encounter (HOSPITAL_COMMUNITY): Payer: Self-pay

## 2022-11-11 DIAGNOSIS — D5700 Hb-SS disease with crisis, unspecified: Secondary | ICD-10-CM | POA: Diagnosis not present

## 2022-11-11 DIAGNOSIS — M79604 Pain in right leg: Secondary | ICD-10-CM | POA: Diagnosis present

## 2022-11-11 LAB — CBC WITH DIFFERENTIAL/PLATELET
Abs Immature Granulocytes: 0.08 10*3/uL — ABNORMAL HIGH (ref 0.00–0.07)
Basophils Absolute: 0.1 10*3/uL (ref 0.0–0.1)
Basophils Relative: 1 %
Eosinophils Absolute: 0.3 10*3/uL (ref 0.0–0.5)
Eosinophils Relative: 2 %
HCT: 32.9 % — ABNORMAL LOW (ref 39.0–52.0)
Hemoglobin: 11.1 g/dL — ABNORMAL LOW (ref 13.0–17.0)
Immature Granulocytes: 1 %
Lymphocytes Relative: 16 %
Lymphs Abs: 2.2 10*3/uL (ref 0.7–4.0)
MCH: 30.4 pg (ref 26.0–34.0)
MCHC: 33.7 g/dL (ref 30.0–36.0)
MCV: 90.1 fL (ref 80.0–100.0)
Monocytes Absolute: 1.1 10*3/uL — ABNORMAL HIGH (ref 0.1–1.0)
Monocytes Relative: 8 %
Neutro Abs: 9.7 10*3/uL — ABNORMAL HIGH (ref 1.7–7.7)
Neutrophils Relative %: 72 %
Platelets: 439 10*3/uL — ABNORMAL HIGH (ref 150–400)
RBC: 3.65 MIL/uL — ABNORMAL LOW (ref 4.22–5.81)
RDW: 20.5 % — ABNORMAL HIGH (ref 11.5–15.5)
WBC: 13.4 10*3/uL — ABNORMAL HIGH (ref 4.0–10.5)
nRBC: 1.8 % — ABNORMAL HIGH (ref 0.0–0.2)

## 2022-11-11 LAB — RETICULOCYTES
Immature Retic Fract: 49.4 % — ABNORMAL HIGH (ref 2.3–15.9)
RBC.: 3.59 MIL/uL — ABNORMAL LOW (ref 4.22–5.81)
Retic Count, Absolute: 253.8 10*3/uL — ABNORMAL HIGH (ref 19.0–186.0)
Retic Ct Pct: 7.1 % — ABNORMAL HIGH (ref 0.4–3.1)

## 2022-11-11 LAB — COMPREHENSIVE METABOLIC PANEL
ALT: 11 U/L (ref 0–44)
AST: 20 U/L (ref 15–41)
Albumin: 4.3 g/dL (ref 3.5–5.0)
Alkaline Phosphatase: 158 U/L — ABNORMAL HIGH (ref 38–126)
Anion gap: 7 (ref 5–15)
BUN: 6 mg/dL (ref 6–20)
CO2: 23 mmol/L (ref 22–32)
Calcium: 8.7 mg/dL — ABNORMAL LOW (ref 8.9–10.3)
Chloride: 110 mmol/L (ref 98–111)
Creatinine, Ser: 0.68 mg/dL (ref 0.61–1.24)
GFR, Estimated: >60 mL/min (ref >60)
Glucose, Bld: 97 mg/dL (ref 70–99)
Potassium: 4 mmol/L (ref 3.5–5.1)
Sodium: 140 mmol/L (ref 135–145)
Total Bilirubin: 1.2 mg/dL (ref 0.3–1.2)
Total Protein: 7.1 g/dL (ref 6.5–8.1)

## 2022-11-11 MED ORDER — ONDANSETRON HCL 4 MG/2ML IJ SOLN
4.0000 mg | Freq: Once | INTRAMUSCULAR | Status: AC
  Administered 2022-11-11: 4 mg via INTRAVENOUS
  Filled 2022-11-11: qty 2

## 2022-11-11 MED ORDER — OXYCODONE HCL 10 MG PO TABS: 10.0000 mg | ORAL_TABLET | Freq: Four times a day (QID) | ORAL | 0 refills | Status: DC | PRN

## 2022-11-11 MED ORDER — HYDROMORPHONE HCL 1 MG/ML IJ SOLN
1.0000 mg | Freq: Once | INTRAMUSCULAR | Status: AC
  Administered 2022-11-11: 1 mg via INTRAMUSCULAR
  Filled 2022-11-11: qty 1

## 2022-11-11 MED ORDER — KETOROLAC TROMETHAMINE 15 MG/ML IJ SOLN
15.0000 mg | Freq: Once | INTRAMUSCULAR | Status: AC
  Administered 2022-11-11: 15 mg via INTRAMUSCULAR
  Filled 2022-11-11: qty 1

## 2022-11-11 MED ORDER — OXYCODONE HCL 5 MG PO TABS
20.0000 mg | ORAL_TABLET | Freq: Once | ORAL | Status: AC
  Administered 2022-11-11: 20 mg via ORAL
  Filled 2022-11-11: qty 4

## 2022-11-11 NOTE — ED Provider Notes (Signed)
[facility] Provider Note   CSN: [id number] Arrival date & time: 11/11/22  1515     History  Chief Complaint  Patient presents with   Sickle Cell Pain Crisis    Jonathan [name] is a 22 y.o. male.  21 year old male with known history of sickle cell presents with complaint of sickle cell painful crisis.  Patient reports exposure to cold may have precipitated his pain crisis today.  He complains of painful lower extremities.  This is consistent with his typical sickle cell painful crisis.  He took 1 dose of oxycodone last night without significant improvement in pain.  He reports that he is out of his oxycodone.  Patient gets most of his primary sickle cell care with the [facility].  He denies associated fever, nausea, vomiting, chest pain, other complaint.  The history is provided by the patient and medical records.       Home Medications Prior to Admission medications   Medication Sig Start Date End Date Taking? Authorizing Provider  ergocalciferol (VITAMIN D2) 1.25 MG (50000 UT) capsule Take 1 capsule (50,000 Units total) by mouth once a week. 09/10/22   Dorena Dew, FNP  oxyCODONE 10 MG TABS Take 1 tablet (10 mg total) by mouth every 6 (six) hours as needed for severe pain. 11/11/22  Yes Dayvin Aber, Wallis Bamberg, MD  guaiFENesin-dextromethorphan (ROBITUSSIN DM) 100-10 MG/5ML syrup Take 10 mLs by mouth every 4 (four) hours as needed for cough (chest congestion). Patient not taking: Reported on 09/02/2022 08/19/22   Tresa Garter, MD  ibuprofen (ADVIL) 800 MG tablet Take 1 tablet (800 mg total) by mouth every 6 (six) hours as needed for mild pain. 10/14/22   Dorena Dew, FNP  methocarbamol (ROBAXIN) 500 MG tablet Take 1 tablet (500 mg total) by mouth every 8 (eight) hours as needed for muscle spasms. Patient not taking: Reported on 08/12/2022 06/14/22   Elwyn Reach, MD  Oxycodone HCl 10 MG TABS  Take 1 tablet (10 mg total) by mouth every 4 (four) hours as needed (pain). 10/14/22   Dorena Dew, FNP      Allergies    Allergy medication [diphenhydramine]    Review of Systems   Review of Systems  All other systems reviewed and are negative.   Physical Exam Updated Vital Signs BP (!) 141/93   Pulse 77   Temp 98.2 F (36.8 C) (Oral)   Resp 15   SpO2 95%  Physical Exam Vitals and nursing note reviewed.  Constitutional:      General: He is not in acute distress.    Appearance: Normal appearance. He is well-developed.  HENT:     Head: Normocephalic and atraumatic.  Eyes:     Conjunctiva/sclera: Conjunctivae normal.     Pupils: Pupils are equal, round, and reactive to light.  Cardiovascular:     Rate and Rhythm: Normal rate and regular rhythm.     Heart sounds: Normal heart sounds.  Pulmonary:     Effort: Pulmonary effort is normal. No respiratory distress.     Breath sounds: Normal breath sounds.  Abdominal:     General: There is no distension.     Palpations: Abdomen is soft.     Tenderness: There is no abdominal tenderness.  Musculoskeletal:        General: No deformity. Normal range of motion.     Cervical back: Normal range of motion and neck supple.  Skin:  General: Skin is warm and dry.  Neurological:     General: No focal deficit present.     Mental Status: He is alert and oriented to person, place, and time.     ED Results / Procedures / Treatments   Labs (all labs ordered are listed, but only abnormal results are displayed) Labs Reviewed  COMPREHENSIVE METABOLIC PANEL - Abnormal; Notable for the following components:      Result Value   Calcium 8.7 (*)    Alkaline Phosphatase 158 (*)    All other components within normal limits  CBC WITH DIFFERENTIAL/PLATELET - Abnormal; Notable for the following components:   WBC 13.4 (*)    RBC 3.65 (*)    Hemoglobin 11.1 (*)    HCT 32.9 (*)    RDW 20.5 (*)    Platelets 439 (*)    nRBC 1.8 (*)     Neutro Abs 9.7 (*)    Monocytes Absolute 1.1 (*)    Abs Immature Granulocytes 0.08 (*)    All other components within normal limits  RETICULOCYTES - Abnormal; Notable for the following components:   Retic Ct Pct 7.1 (*)    RBC. 3.59 (*)    Retic Count, Absolute 253.8 (*)    Immature Retic Fract 49.4 (*)    All other components within normal limits    EKG None  Radiology No results found.  Procedures Procedures    Medications Ordered in ED Medications  oxyCODONE (Oxy IR/ROXICODONE) immediate release tablet 20 mg (20 mg Oral Given 11/11/22 1644)  HYDROmorphone (DILAUDID) injection 1 mg (1 mg Intramuscular Given 11/11/22 1644)  ketorolac (TORADOL) 15 MG/ML injection 15 mg (15 mg Intramuscular Given 11/11/22 1644)  ondansetron (ZOFRAN) injection 4 mg (4 mg Intravenous Given 11/11/22 1644)    ED Course/ Medical Decision Making/ A&P                             Medical Decision Making Risk Prescription drug management.    Medical Screen Complete  This patient presented to the ED with complaint of sickle cell painful crisis.  This complaint involves an extensive number of treatment options. The initial differential diagnosis includes, but is not limited to, sickle cell crisis, metabolic abnormality, etc.  This presentation is: Acute, Chronic, Self-Limited, Previously Undiagnosed, Uncertain Prognosis, Complicated, Systemic Symptoms, and Threat to Life/Bodily Function  Patient presents with complaint of sickle cell related pain.  Patient's described pain is consistent with his typical sickle cell crisis.  Patient is also reporting that he is out of his oxycodone.  He reports using this intermittently for control of pain.  Screening labs obtained today are without significant abnormality.  Patient feels much improved after initial pain treatment here in the ED.  Patient is desiring discharge home.  He will be prescribed a small amount of oxycodone for the next several days  until he can follow-up with his primary outpatient providers.  Importance of close follow-up is stressed.  Strict return precautions given and understood.   Additional history obtained:  External records from outside sources obtained and reviewed including prior ED visits and prior Inpatient records.    Lab Tests:  I ordered and personally interpreted labs.  The pertinent results include: CBC, CMP, retic  Medicines ordered:  I ordered medication including Toradol, Dilaudid, oxycodone for pain Reevaluation of the patient after these medicines showed that the patient: improved   Problem List / ED Course:  Sickle cell  painful crisis   Reevaluation:  After the interventions noted above, I reevaluated the patient and found that they have: improved   Disposition:  After consideration of the diagnostic results and the patients response to treatment, I feel that the patent would benefit from close outpatient follow-up.          Final Clinical Impression(s) / ED Diagnoses Final diagnoses:  Sickle cell pain crisis [facility])    Rx / DC Orders ED Discharge Orders          Ordered    oxyCODONE 10 MG TABS  Every 6 hours PRN        11/11/22 1747              Valarie Merino, MD 11/11/22 1755

## 2022-11-11 NOTE — ED Triage Notes (Signed)
C/o SCC with pain to left leg and left shoulder  since last night.  Denies cp/sob Ibuprofen at home w/o relief

## 2022-11-11 NOTE — Discharge Instructions (Signed)
Return for any problem.  ?

## 2022-11-12 ENCOUNTER — Telehealth (HOSPITAL_COMMUNITY): Payer: Self-pay

## 2022-11-12 NOTE — Telephone Encounter (Signed)
Pt called day hospital wanting to come in for sickle cell pain treatment. Pt advised by RN that the clinic is closed today due to staffing. RN informed pt that the day hospital will be open tomorrow and Friday. Pt advised to call back at 8 AM tomorrow if needed. Pt verbalized understanding.

## 2022-11-13 ENCOUNTER — Non-Acute Institutional Stay (HOSPITAL_COMMUNITY)
Admission: AD | Admit: 2022-11-13 | Discharge: 2022-11-13 | Disposition: A | Discharge status: Home / Self Care | Payer: Commercial Managed Care - PPO | Source: Ambulatory Visit | Attending: Internal Medicine | Admitting: Internal Medicine

## 2022-11-13 ENCOUNTER — Telehealth (HOSPITAL_COMMUNITY): Payer: Self-pay

## 2022-11-13 DIAGNOSIS — D571 Sickle-cell disease without crisis: Secondary | ICD-10-CM

## 2022-11-13 DIAGNOSIS — G894 Chronic pain syndrome: Secondary | ICD-10-CM | POA: Insufficient documentation

## 2022-11-13 DIAGNOSIS — D5700 Hb-SS disease with crisis, unspecified: Secondary | ICD-10-CM | POA: Diagnosis not present

## 2022-11-13 DIAGNOSIS — D638 Anemia in other chronic diseases classified elsewhere: Secondary | ICD-10-CM | POA: Diagnosis not present

## 2022-11-13 DIAGNOSIS — Z833 Family history of diabetes mellitus: Secondary | ICD-10-CM | POA: Diagnosis not present

## 2022-11-13 DIAGNOSIS — F1120 Opioid dependence, uncomplicated: Secondary | ICD-10-CM | POA: Insufficient documentation

## 2022-11-13 DIAGNOSIS — Z79891 Long term (current) use of opiate analgesic: Secondary | ICD-10-CM

## 2022-11-13 LAB — COMPREHENSIVE METABOLIC PANEL
ALT: 10 U/L (ref 0–44)
AST: 15 U/L (ref 15–41)
Albumin: 4.3 g/dL (ref 3.5–5.0)
Alkaline Phosphatase: 136 U/L — ABNORMAL HIGH (ref 38–126)
Anion gap: 10 (ref 5–15)
BUN: 7 mg/dL (ref 6–20)
CO2: 26 mmol/L (ref 22–32)
Calcium: 9.1 mg/dL (ref 8.9–10.3)
Chloride: 101 mmol/L (ref 98–111)
Creatinine, Ser: 0.69 mg/dL (ref 0.61–1.24)
GFR, Estimated: >60 mL/min (ref >60)
Glucose, Bld: 91 mg/dL (ref 70–99)
Potassium: 3.9 mmol/L (ref 3.5–5.1)
Sodium: 137 mmol/L (ref 135–145)
Total Bilirubin: 2 mg/dL — ABNORMAL HIGH (ref 0.3–1.2)
Total Protein: 7.3 g/dL (ref 6.5–8.1)

## 2022-11-13 LAB — CBC WITH DIFFERENTIAL/PLATELET
Abs Immature Granulocytes: 0.05 10*3/uL (ref 0.00–0.07)
Basophils Absolute: 0.1 10*3/uL (ref 0.0–0.1)
Basophils Relative: 1 %
Eosinophils Absolute: 0.3 10*3/uL (ref 0.0–0.5)
Eosinophils Relative: 3 %
HCT: 31.1 % — ABNORMAL LOW (ref 39.0–52.0)
Hemoglobin: 10.5 g/dL — ABNORMAL LOW (ref 13.0–17.0)
Immature Granulocytes: 0 %
Lymphocytes Relative: 17 %
Lymphs Abs: 2.3 10*3/uL (ref 0.7–4.0)
MCH: 30.3 pg (ref 26.0–34.0)
MCHC: 33.8 g/dL (ref 30.0–36.0)
MCV: 89.9 fL (ref 80.0–100.0)
Monocytes Absolute: 1.4 10*3/uL — ABNORMAL HIGH (ref 0.1–1.0)
Monocytes Relative: 11 %
Neutro Abs: 9 10*3/uL — ABNORMAL HIGH (ref 1.7–7.7)
Neutrophils Relative %: 68 %
Platelets: 432 10*3/uL — ABNORMAL HIGH (ref 150–400)
RBC: 3.46 MIL/uL — ABNORMAL LOW (ref 4.22–5.81)
RDW: 19.6 % — ABNORMAL HIGH (ref 11.5–15.5)
WBC: 13.2 10*3/uL — ABNORMAL HIGH (ref 4.0–10.5)
nRBC: 1.2 % — ABNORMAL HIGH (ref 0.0–0.2)

## 2022-11-13 LAB — RETICULOCYTES
Immature Retic Fract: 42.9 % — ABNORMAL HIGH (ref 2.3–15.9)
RBC.: 3.42 MIL/uL — ABNORMAL LOW (ref 4.22–5.81)
Retic Count, Absolute: 225.4 10*3/uL — ABNORMAL HIGH (ref 19.0–186.0)
Retic Ct Pct: 6.6 % — ABNORMAL HIGH (ref 0.4–3.1)

## 2022-11-13 MED ORDER — OXYCODONE HCL 10 MG PO TABS: 10.0000 mg | ORAL_TABLET | ORAL | 0 refills | Status: DC | PRN

## 2022-11-13 MED ORDER — SODIUM CHLORIDE 0.45 % IV SOLN: INTRAVENOUS | Status: DC

## 2022-11-13 MED ORDER — HYDROMORPHONE 1 MG/ML IV SOLN
INTRAVENOUS | Status: DC
  Administered 2022-11-13: 30 mg via INTRAVENOUS
  Administered 2022-11-13: 9.5 mg via INTRAVENOUS
  Filled 2022-11-13: qty 30

## 2022-11-13 MED ORDER — KETOROLAC TROMETHAMINE 30 MG/ML IJ SOLN
15.0000 mg | Freq: Once | INTRAMUSCULAR | Status: AC
  Administered 2022-11-13: 15 mg via INTRAVENOUS
  Filled 2022-11-13: qty 1

## 2022-11-13 MED ORDER — ONDANSETRON HCL 4 MG/2ML IJ SOLN
4.0000 mg | Freq: Four times a day (QID) | INTRAMUSCULAR | Status: DC | PRN
  Administered 2022-11-13: 4 mg via INTRAVENOUS
  Filled 2022-11-13: qty 2

## 2022-11-13 MED ORDER — ACETAMINOPHEN 500 MG PO TABS
1000.0000 mg | ORAL_TABLET | Freq: Once | ORAL | Status: AC
  Administered 2022-11-13: 1000 mg via ORAL
  Filled 2022-11-13: qty 2

## 2022-11-13 MED ORDER — NALOXONE HCL 0.4 MG/ML IJ SOLN: 0.4000 mg | INTRAMUSCULAR | Status: DC | PRN

## 2022-11-13 MED ORDER — SODIUM CHLORIDE 0.9% FLUSH: 9.0000 mL | INTRAVENOUS | Status: DC | PRN

## 2022-11-13 NOTE — Telephone Encounter (Signed)
Pt called day hospital today wanting to come in for sickle cell pain treatment. Pt reports 7/10 back and left leg pain. Pt reports taking Oxycodone 10 mg at 5 AM. Pt said he was in the ER yesterday. Pt screened for COVID and denies symptoms and exposures. Pt denies fever, chest pain, nausea, vomiting, diarrhea, abdominal pain, and priapism. [place] Hollis, [place] notified and she said she pt can come to day hospital today. Pt notified and verbalized understanding. Pt said his grandma will be his transportation today.

## 2022-11-13 NOTE — Discharge Summary (Signed)
[facility] Discharge Summary   Patient ID: Jonathan [name] MRN: [id number] DOB/AGE: [birth year] 24 y.o.  Admit date: 11/13/2022 Discharge date: 11/13/2022  Primary Care Physician:  Tresa Garter, MD  Admission Diagnoses:  Active Problems:   Sickle cell pain crisis [facility])   Discharge Medications:  Allergies as of 11/13/2022       Reactions   Allergy Medication [diphenhydramine] Swelling, Other (See Comments)   Face swells.         Medication List     STOP taking these medications    ergocalciferol 1.25 MG (50000 UT) capsule Commonly known as: VITAMIN D2   guaiFENesin-dextromethorphan 100-10 MG/5ML syrup Commonly known as: ROBITUSSIN DM   ibuprofen 800 MG tablet Commonly known as: ADVIL   methocarbamol 500 MG tablet Commonly known as: ROBAXIN       TAKE these medications    Oxycodone HCl 10 MG Tabs Take 1 tablet (10 mg total) by mouth every 4 (four) hours as needed (pain). What changed: Another medication with the same name was removed. Continue taking this medication, and follow the directions you see here.         Consults:  None  Significant Diagnostic Studies:  No results found.  History of Present Illness: Jonathan [name] is a 22 year old male with a medical history significant for sickle cell disease, chronic pain syndrome, opiate dependence and tolerance, and anemia of chronic disease that presents with complaints of pain to left lower extremity over the past 3 days.  Patient states that he awakened 3 days prior with intense pain to left leg.  Pain is consistent with his previous sickle cell crisis.  He has been taking oxycodone and ibuprofen consistently without very much relief.  Current pain intensity is 9/10, constant, and throbbing.  Patient denies any fever, chills, chest pain, or shortness of breath.  No urinary symptoms, nausea, vomiting, or diarrhea.  No sick contacts, recent travel, or known exposure to COVID-19.    [facility] Course: Patient admitted to sickle cell day infusion center for management of his pain crisis. Reviewed all laboratory values WBC slightly elevated at 13.2.  Hemoglobin 10.5, consistent with patient's baseline.  Comprehensive metabolic panel unremarkable. Patient's pain crisis managed with IV Dilaudid PCA. IV fluids, 0.45% saline at 100 mL/h Toradol 15 mg IV x 1 Tylenol 1000 mg x 1 Patient's pain intensity decreased throughout admission and he will be discharged home in a hemodynamically stable condition.  Patient is alert, oriented, and ambulating without assistance.  Discharge instructions: Resume all home medications.   Follow up with PCP as previously  scheduled.   Discussed the importance of drinking 64 ounces of water daily, dehydration of red blood cells may lead further sickling.   Avoid all stressors that precipitate sickle cell pain crisis.     The patient was given clear instructions to go to ER or return to medical center if symptoms do not improve, worsen or new problems develop.    Physical Exam at Discharge:  BP 115/68 (BP Location: Left Arm)   Pulse 77   Temp 98.8 F (37.1 C) (Temporal)   Resp 16   SpO2 96%   Physical Exam Constitutional:      Appearance: Normal appearance.  Eyes:     Pupils: Pupils are equal, round, and reactive to light.  Cardiovascular:     Rate and Rhythm: Normal rate and regular rhythm.     Pulses: Normal pulses.  Pulmonary:  Effort: Pulmonary effort is normal.  Abdominal:     General: Bowel sounds are normal.  Musculoskeletal:        General: Normal range of motion.  Skin:    General: Skin is warm.  Neurological:     General: No focal deficit present.     Mental Status: He is alert. Mental status is at baseline.  Psychiatric:        Mood and Affect: Mood normal.        Behavior: Behavior normal.        Thought Content: Thought content normal.        Judgment: Judgment normal.       Disposition at Discharge: Discharge disposition: 01-Home or Self Care       Discharge Orders: Discharge Instructions     Discharge patient   Complete by: As directed    Discharge disposition: 01-Home or Self Care   Discharge patient date: 11/13/2022       Condition at Discharge:   Stable  Time spent on Discharge:  Greater than 30 minutes.  Signed: [name]  APRN, MSN, FNP-C [name] [address] [place], [place] [address] [phone]  11/13/2022, 3:28 PM

## 2022-11-13 NOTE — H&P (Signed)
[facility] History and Physical   Date: 11/13/2022  Patient name: Jonathan [name] Medical record number: [id number] Date of birth: August 16, 2001 Age: 22 y.o. Gender: male PCP: Tresa Garter, MD  Attending physician: Tresa Garter, MD  Chief Complaint: Sickle cell pain   History of Present Illness: Jonathan [name] is a 22 year old male with a medical history significant for sickle cell disease, chronic pain syndrome, opiate dependence and tolerance, and anemia of chronic disease that presents with complaints of pain to left lower extremity over the past 3 days.  Patient states that he awakened 3 days prior with intense pain to left leg.  Pain is consistent with his previous sickle cell crisis.  He has been taking oxycodone and ibuprofen consistently without very much relief.  Current pain intensity is 9/10, constant, and throbbing.  Patient denies any fever, chills, chest pain, or shortness of breath.  No urinary symptoms, nausea, vomiting, or diarrhea.  No sick contacts, recent travel, or known exposure to COVID-19.  Meds: Medications Prior to Admission  Medication Sig Dispense Refill Last Dose   ergocalciferol (VITAMIN D2) 1.25 MG (50000 UT) capsule Take 1 capsule (50,000 Units total) by mouth once a week. 12 capsule 11    guaiFENesin-dextromethorphan (ROBITUSSIN DM) 100-10 MG/5ML syrup Take 10 mLs by mouth every 4 (four) hours as needed for cough (chest congestion). (Patient not taking: Reported on 09/02/2022) 480 mL 0    ibuprofen (ADVIL) 800 MG tablet Take 1 tablet (800 mg total) by mouth every 6 (six) hours as needed for mild pain. 30 tablet 5    methocarbamol (ROBAXIN) 500 MG tablet Take 1 tablet (500 mg total) by mouth every 8 (eight) hours as needed for muscle spasms. (Patient not taking: Reported on 08/12/2022) 60 tablet 0    oxyCODONE 10 MG TABS Take 1 tablet (10 mg total) by mouth every 6 (six) hours as needed for severe pain. 12 tablet 0    Oxycodone HCl 10  MG TABS Take 1 tablet (10 mg total) by mouth every 4 (four) hours as needed (pain). 60 tablet 0     Allergies: Allergy medication [diphenhydramine] Past Medical History:  Diagnosis Date   Acute chest syndrome(517.3) 08/26/2011   march 2013, Sept 2013   Asthma    Avascular necrosis of bone of left hip ([place])    Sickle cell disease, type SS ([place])    Past Surgical History:  Procedure Laterality Date   ADENOIDECTOMY     SPLENECTOMY     TONSILLECTOMY     TONSILLECTOMY AND ADENOIDECTOMY     TOTAL HIP ARTHROPLASTY Left [id number]   UMBILICAL HERNIA REPAIR     Family History  Problem Relation Age of Onset   Hypertension Mother    Hypertension Maternal Grandmother    Hypertension Maternal Grandfather    Asthma Paternal Grandfather    Diabetes Paternal Grandfather    Social History   Socioeconomic History   Marital status: Single    Spouse name: Not on file   Number of children: Not on file   Years of education: Not on file   Highest education level: Not on file  Occupational History   Not on file  Tobacco Use   Smoking status: Never   Smokeless tobacco: Never  Vaping Use   Vaping Use: Never used  Substance and Sexual Activity   Alcohol use: No    Alcohol/week: 0.0 standard drinks of alcohol   Drug use: No   Sexual activity: Never  Other Topics Concern   Not on file  Social History Narrative   Lives with mother.  No smokers at home      Update:       05/02/16 Lives with Mother only   Social Determinants of Health   Financial Resource Strain: Not on file  Food Insecurity: No Food Insecurity (08/12/2022)   Hunger Vital Sign    Worried About Running Out of Food in the Last Year: Never true    Ran Out of Food in the Last Year: Never true  Transportation Needs: Unmet Transportation Needs (08/12/2022)   PRAPARE - Hydrologist (Medical): Yes    Lack of Transportation (Non-Medical): Yes  Physical Activity: Not on file  Stress: Not on file   Social Connections: Not on file  Intimate Partner Violence: Not At Risk (08/12/2022)   Humiliation, Afraid, Rape, and Kick questionnaire    Fear of Current or Ex-Partner: No    Emotionally Abused: No    Physically Abused: No    Sexually Abused: No   Review of Systems  Constitutional: Negative.   HENT: Negative.    Eyes: Negative.   Respiratory: Negative.    Cardiovascular: Negative.   Gastrointestinal: Negative.   Musculoskeletal:  Positive for back pain and joint pain.  Skin: Negative.   Neurological: Negative.   Psychiatric/Behavioral: Negative.      Physical Exam: There were no vitals taken for this visit. Physical Exam Constitutional:      Appearance: Normal appearance.  Eyes:     Pupils: Pupils are equal, round, and reactive to light.  Cardiovascular:     Rate and Rhythm: Normal rate and regular rhythm.     Pulses: Normal pulses.  Pulmonary:     Effort: Pulmonary effort is normal.  Abdominal:     General: Bowel sounds are normal.  Skin:    General: Skin is warm.  Neurological:     General: No focal deficit present.     Mental Status: He is alert. Mental status is at baseline.  Psychiatric:        Mood and Affect: Mood normal.        Behavior: Behavior normal.        Thought Content: Thought content normal.        Judgment: Judgment normal.     Lab results: No results found for this or any previous visit (from the past 24 hour(s)).  Imaging results:  No results found.   Assessment & Plan: Patient admitted to sickle cell day infusion center for management of pain crisis.  Patient is opiate tolerant Initiate IV dilaudid PCA.  IV fluids, 0.45% saline at 100 ml/hr Toradol 15 mg IV times one dose Tylenol 1000 mg by mouth times one dose Review CBC with differential, complete metabolic panel, and reticulocytes as results become available.  Pain intensity will be reevaluated in context of functioning and relationship to baseline as care progresses If pain  intensity remains elevated and/or sudden change in hemodynamic stability transition to inpatient services for higher level of care.      [name]  APRN, MSN, FNP-C [facility] [address] [place], [place] [address] [phone]  11/13/2022, 9:01 AM

## 2022-11-13 NOTE — Progress Notes (Signed)
Patient admitted to the day hospital for sickle cell pain. Initially, patient reported left leg pain rated 7/10. For pain management, patient placed on Sickle Cell Dose Dilaudid PCA, given 1000 mg Tylenol, 15 mg IV Toradol and hydrated with IV fluids. At discharge, patient rated pain at 2/10. Vital signs stable. Discharge instructions given. Patient alert, oriented and ambulatory at discharge.

## 2022-11-28 ENCOUNTER — Telehealth: Payer: Self-pay | Admitting: Internal Medicine

## 2022-11-28 NOTE — Telephone Encounter (Signed)
Caller & Relationship to patient:  MRN #  [id number]   Call Back Number:   Date of Last Office Visit: 10/14/2022     Date of Next Office Visit: 12/02/2022    Medication(s) to be Refilled: Oxycodone Ibuprofen   Preferred Pharmacy:   ** Please notify patient to allow 48-72 hours to process** **Let patient know to contact pharmacy at the end of the day to make sure medication is ready. ** **If patient has not been seen in a year or longer, book an appointment **Advise to use MyChart for refill requests OR to contact their pharmacy

## 2022-11-30 ENCOUNTER — Emergency Department (HOSPITAL_COMMUNITY): Payer: Commercial Managed Care - PPO

## 2022-11-30 ENCOUNTER — Emergency Department (HOSPITAL_COMMUNITY)
Admission: EM | Admit: 2022-11-30 | Discharge: 2022-11-30 | Disposition: A | Discharge status: Home / Self Care | Payer: Commercial Managed Care - PPO | Attending: Emergency Medicine | Admitting: Emergency Medicine

## 2022-11-30 ENCOUNTER — Encounter (HOSPITAL_COMMUNITY): Payer: Self-pay

## 2022-11-30 ENCOUNTER — Other Ambulatory Visit: Payer: Self-pay

## 2022-11-30 ENCOUNTER — Emergency Department (HOSPITAL_BASED_OUTPATIENT_CLINIC_OR_DEPARTMENT_OTHER)
Admit: 2022-11-30 | Discharge: 2022-11-30 | Disposition: A | Discharge status: Home / Self Care | Payer: Commercial Managed Care - PPO

## 2022-11-30 DIAGNOSIS — D5700 Hb-SS disease with crisis, unspecified: Secondary | ICD-10-CM | POA: Diagnosis not present

## 2022-11-30 DIAGNOSIS — R52 Pain, unspecified: Secondary | ICD-10-CM

## 2022-11-30 DIAGNOSIS — M79602 Pain in left arm: Secondary | ICD-10-CM | POA: Diagnosis present

## 2022-11-30 DIAGNOSIS — Z79891 Long term (current) use of opiate analgesic: Secondary | ICD-10-CM

## 2022-11-30 DIAGNOSIS — D571 Sickle-cell disease without crisis: Secondary | ICD-10-CM

## 2022-11-30 LAB — TYPE AND SCREEN
ABO/RH(D): O POS
Antibody Screen: NEGATIVE

## 2022-11-30 LAB — COMPREHENSIVE METABOLIC PANEL
ALT: 9 U/L (ref 0–44)
AST: 19 U/L (ref 15–41)
Albumin: 4.3 g/dL (ref 3.5–5.0)
Alkaline Phosphatase: 117 U/L (ref 38–126)
Anion gap: 8 (ref 5–15)
BUN: 6 mg/dL (ref 6–20)
CO2: 23 mmol/L (ref 22–32)
Calcium: 8.9 mg/dL (ref 8.9–10.3)
Chloride: 108 mmol/L (ref 98–111)
Creatinine, Ser: 0.84 mg/dL (ref 0.61–1.24)
GFR, Estimated: >60 mL/min (ref >60)
Glucose, Bld: 110 mg/dL — ABNORMAL HIGH (ref 70–99)
Potassium: 3.6 mmol/L (ref 3.5–5.1)
Sodium: 139 mmol/L (ref 135–145)
Total Bilirubin: 0.8 mg/dL (ref 0.3–1.2)
Total Protein: 7.4 g/dL (ref 6.5–8.1)

## 2022-11-30 LAB — URINALYSIS, ROUTINE W REFLEX MICROSCOPIC
Bilirubin Urine: NEGATIVE
Glucose, UA: NEGATIVE mg/dL
Hgb urine dipstick: NEGATIVE
Ketones, ur: NEGATIVE mg/dL
Leukocytes,Ua: NEGATIVE
Nitrite: NEGATIVE
Protein, ur: NEGATIVE mg/dL
Specific Gravity, Urine: 1.013 (ref 1.005–1.030)
pH: 6 (ref 5.0–8.0)

## 2022-11-30 LAB — CBC WITH DIFFERENTIAL/PLATELET
Abs Immature Granulocytes: 0.02 10*3/uL (ref 0.00–0.07)
Basophils Absolute: 0.1 10*3/uL (ref 0.0–0.1)
Basophils Relative: 1 %
Eosinophils Absolute: 0.1 10*3/uL (ref 0.0–0.5)
Eosinophils Relative: 1 %
HCT: 29.4 % — ABNORMAL LOW (ref 39.0–52.0)
Hemoglobin: 9.7 g/dL — ABNORMAL LOW (ref 13.0–17.0)
Immature Granulocytes: 0 %
Lymphocytes Relative: 40 %
Lymphs Abs: 3.3 10*3/uL (ref 0.7–4.0)
MCH: 28.5 pg (ref 26.0–34.0)
MCHC: 33 g/dL (ref 30.0–36.0)
MCV: 86.5 fL (ref 80.0–100.0)
Monocytes Absolute: 0.7 10*3/uL (ref 0.1–1.0)
Monocytes Relative: 8 %
Neutro Abs: 4 10*3/uL (ref 1.7–7.7)
Neutrophils Relative %: 50 %
Platelets: 492 10*3/uL — ABNORMAL HIGH (ref 150–400)
RBC: 3.4 MIL/uL — ABNORMAL LOW (ref 4.22–5.81)
RDW: 21.8 % — ABNORMAL HIGH (ref 11.5–15.5)
WBC: 8.1 10*3/uL (ref 4.0–10.5)
nRBC: 1.6 % — ABNORMAL HIGH (ref 0.0–0.2)

## 2022-11-30 LAB — LACTIC ACID, PLASMA: Lactic Acid, Venous: 1.1 mmol/L (ref 0.5–1.9)

## 2022-11-30 LAB — RETICULOCYTES
Immature Retic Fract: 46.2 % — ABNORMAL HIGH (ref 2.3–15.9)
RBC.: 3.35 MIL/uL — ABNORMAL LOW (ref 4.22–5.81)
Retic Count, Absolute: 229.8 10*3/uL — ABNORMAL HIGH (ref 19.0–186.0)
Retic Ct Pct: 6.9 % — ABNORMAL HIGH (ref 0.4–3.1)

## 2022-11-30 MED ORDER — HYDROMORPHONE HCL 2 MG/ML IJ SOLN
2.0000 mg | INTRAMUSCULAR | Status: AC
  Administered 2022-11-30: 2 mg via INTRAVENOUS
  Filled 2022-11-30: qty 1

## 2022-11-30 MED ORDER — SODIUM CHLORIDE 0.45 % IV SOLN: INTRAVENOUS | Status: DC

## 2022-11-30 MED ORDER — KETOROLAC TROMETHAMINE 15 MG/ML IJ SOLN
15.0000 mg | Freq: Once | INTRAMUSCULAR | Status: DC
  Filled 2022-11-30: qty 1

## 2022-11-30 MED ORDER — ONDANSETRON HCL 4 MG/2ML IJ SOLN
4.0000 mg | INTRAMUSCULAR | Status: DC | PRN
  Administered 2022-11-30: 4 mg via INTRAVENOUS
  Filled 2022-11-30: qty 2

## 2022-11-30 MED ORDER — KETOROLAC TROMETHAMINE 15 MG/ML IJ SOLN
15.0000 mg | INTRAMUSCULAR | Status: AC
  Administered 2022-11-30: 15 mg via INTRAVENOUS
  Filled 2022-11-30: qty 1

## 2022-11-30 MED ORDER — OXYCODONE HCL 10 MG PO TABS
10.0000 mg | ORAL_TABLET | ORAL | 0 refills | Status: DC | PRN
Start: 2022-11-30 — End: 2022-12-01

## 2022-11-30 NOTE — ED Provider Notes (Signed)
[facility] EMERGENCY DEPARTMENT AT [facility] Provider Note   CSN: [id number] Arrival date & time: 11/30/22  8563     History  Chief Complaint  Patient presents with   Sickle Cell Pain Crisis    Jonathan [name] is a 22 y.o. male history of sickle cell days of progressive sickle cell pain.  Patient states he is in a crisis.  Patient was unable to go to the clinic on Friday due to it being closed.  Patient is been taking ibuprofen and 3 doses of oxycodone at home daily to no relief.  Patient states pain is in his left arm going to his back which is consistent with previous sickle cell pain crises for him.    Patient denies any chest pain, shortness of breath, abdominal pain, nausea/vomiting, vision changes, headaches, change in sensation/motor skills, fever/chills, sick contacts, cough, calf pain, arm swelling  Home Medications Prior to Admission medications   Medication Sig Start Date End Date Taking? Authorizing Provider  Oxycodone HCl 10 MG TABS Take 1 tablet (10 mg total) by mouth every 4 (four) hours as needed (pain). 11/13/22   Massie Maroon, FNP      Allergies    Allergy medication [diphenhydramine]    Review of Systems   Review of Systems See HPI Physical Exam Updated Vital Signs BP (!) 155/110 (BP Location: Right Arm)   Pulse 85   Temp 97.9 F (36.6 C) (Oral)   Resp 18   SpO2 97%  Physical Exam Constitutional:      Comments: Rolling around the bed in pain  HENT:     Head: Normocephalic and atraumatic.  Eyes:     Extraocular Movements: Extraocular movements intact.     Conjunctiva/sclera: Conjunctivae normal.     Pupils: Pupils are equal, round, and reactive to light.  Cardiovascular:     Rate and Rhythm: Normal rate and regular rhythm.     Pulses: Normal pulses.     Heart sounds: Normal heart sounds.     Comments: 2+ bilateral radial pulses with regular rate Pulmonary:     Effort: Pulmonary effort is normal. No respiratory distress.      Breath sounds: Normal breath sounds.  Abdominal:     Palpations: Abdomen is soft.     Tenderness: There is no abdominal tenderness. There is no guarding or rebound.  Musculoskeletal:        General: Normal range of motion.     Cervical back: Normal range of motion. No tenderness.     Comments: Arms do not appear to be swollen 5/5 bilateral grip strength  Skin:    General: Skin is warm and dry.     Capillary Refill: Capillary refill takes less than 2 seconds.  Neurological:     General: No focal deficit present.     Mental Status: He is alert and oriented to person, place, and time.     Comments: Sensation intact distally in all 4 limbs  Psychiatric:        Mood and Affect: Mood normal.     ED Results / Procedures / Treatments   Labs (all labs ordered are listed, but only abnormal results are displayed) Labs Reviewed - No data to display  EKG None  Radiology No results found.  Procedures Procedures    Medications Ordered in ED Medications  0.45 % sodium chloride infusion (has no administration in time range)  ketorolac (TORADOL) 15 MG/ML injection 15 mg (has no administration in time range)  HYDROmorphone (DILAUDID) injection 2 mg (has no administration in time range)  HYDROmorphone (DILAUDID) injection 2 mg (has no administration in time range)  HYDROmorphone (DILAUDID) injection 2 mg (has no administration in time range)  ondansetron (ZOFRAN) injection 4 mg (has no administration in time range)    ED Course/ Medical Decision Making/ A&P                             Medical Decision Making Amount and/or Complexity of Data Reviewed Labs: ordered. Radiology: ordered.  Risk Prescription drug management.   [name] 22 y.o. presented today for sickle cell pain. Working DDx that I considered at this time includes, but not limited to, sickle cell pain crisis, Anemia, aplastic crisis, acute chest syndrome, AKI, VTE, hemolysis.  R/o DDx: Anemia, aplastic  crisis, acute chest syndrome, AKI, VTE, hemolysis: These are considered less likely due to history of present illness and physical exam findings  Review of prior external notes: 11/04/2022 discharge summary  Unique Tests and My Interpretation:  CBC with differential: Unremarkable, previous results CMP: Unremarkable Reticulocytes: Unremarkable EKG: Sinus 69 bpm, no ST abnormalities or blocks noted Lactate: Unremarkable Type and Screen: O neg UA: Unremarkable Chest x-ray: Interval mild left lobar atelectasis  Discussion with Independent Historian: None  Discussion of Management of Tests: None  Risk: Medium: prescription drug management  Risk Stratification Score: none  Staffed with Estell Harpin, MD  Plan: Patient presented for sickle cell pain. On exam patient was in distress rolling around on the bed in pain and had stable vitals.  Sickle cell labs and imaging ordered along with fluids and pain management.  On recheck patient stated he was feeling much better now the pain is gone from a 10 out of 10 to a 4 out of 10.  I suspect patient's pain is managed now that he would be safe for discharge pending ultrasound for DVT.  Patient stable at this time.  On recheck patient stated his pain got down from the 4 out of 10 and feels much better.  Patient's ultrasound is negative for any blood clot.  Due to patient's reassuring labs and imaging and improvement in pain patient will be discharged with outpatient follow-up as he has an appointment tomorrow with his primary care provider.  Patient states he is out of his pain meds at home and so his oxycodone will be refilled to get him through the rest of the day.  All patient's questions were answered to his satisfaction.  Patient was given return precautions. Patient stable for discharge at this time.  Patient verbalized understanding of plan.        Final Clinical Impression(s) / ED Diagnoses Final diagnoses:  None    Rx / DC Orders ED  Discharge Orders     None         Remi Deter 11/30/22 1315    Bethann Berkshire, MD 12/03/22 [phone]

## 2022-11-30 NOTE — Discharge Instructions (Addendum)
Please make your appointment with your primary care provider tomorrow regarding recent symptoms and ER visit.  I have given you enough oxycodone to get through the rest of the day however if symptoms worsen please return to ER.

## 2022-11-30 NOTE — ED Triage Notes (Signed)
Patient reports sickle cell pain in back and right arm. States it has been going on for 3 days and trying to manage at home but unable to.

## 2022-11-30 NOTE — Progress Notes (Signed)
Right upper extremity venous duplex has been completed. Preliminary results can be found in CV Proc through chart review.  Results were given to Evlyn Kanner PA.  11/30/22 12:45 PM Olen Cordial RVT

## 2022-12-01 ENCOUNTER — Telehealth (HOSPITAL_COMMUNITY): Payer: Self-pay | Admitting: *Deleted

## 2022-12-01 ENCOUNTER — Non-Acute Institutional Stay (HOSPITAL_COMMUNITY)
Admission: AD | Admit: 2022-12-01 | Discharge: 2022-12-01 | Disposition: A | Discharge status: Home / Self Care | Payer: Commercial Managed Care - PPO | Attending: Internal Medicine | Admitting: Internal Medicine

## 2022-12-01 DIAGNOSIS — Z79891 Long term (current) use of opiate analgesic: Secondary | ICD-10-CM

## 2022-12-01 DIAGNOSIS — D5700 Hb-SS disease with crisis, unspecified: Secondary | ICD-10-CM | POA: Diagnosis present

## 2022-12-01 DIAGNOSIS — D571 Sickle-cell disease without crisis: Secondary | ICD-10-CM

## 2022-12-01 DIAGNOSIS — M79601 Pain in right arm: Secondary | ICD-10-CM | POA: Insufficient documentation

## 2022-12-01 DIAGNOSIS — Z8709 Personal history of other diseases of the respiratory system: Secondary | ICD-10-CM | POA: Diagnosis not present

## 2022-12-01 LAB — CBC WITH DIFFERENTIAL/PLATELET
Abs Immature Granulocytes: 0.02 10*3/uL (ref 0.00–0.07)
Basophils Absolute: 0.1 10*3/uL (ref 0.0–0.1)
Basophils Relative: 1 %
Eosinophils Absolute: 0.4 10*3/uL (ref 0.0–0.5)
Eosinophils Relative: 4 %
HCT: 30.1 % — ABNORMAL LOW (ref 39.0–52.0)
Hemoglobin: 10.1 g/dL — ABNORMAL LOW (ref 13.0–17.0)
Immature Granulocytes: 0 %
Lymphocytes Relative: 28 %
Lymphs Abs: 2.8 10*3/uL (ref 0.7–4.0)
MCH: 28.8 pg (ref 26.0–34.0)
MCHC: 33.6 g/dL (ref 30.0–36.0)
MCV: 85.8 fL (ref 80.0–100.0)
Monocytes Absolute: 0.7 10*3/uL (ref 0.1–1.0)
Monocytes Relative: 7 %
Neutro Abs: 5.8 10*3/uL (ref 1.7–7.7)
Neutrophils Relative %: 60 %
Platelets: 505 10*3/uL — ABNORMAL HIGH (ref 150–400)
RBC: 3.51 MIL/uL — ABNORMAL LOW (ref 4.22–5.81)
RDW: 21.7 % — ABNORMAL HIGH (ref 11.5–15.5)
WBC: 9.7 10*3/uL (ref 4.0–10.5)
nRBC: 1.1 % — ABNORMAL HIGH (ref 0.0–0.2)

## 2022-12-01 LAB — LACTATE DEHYDROGENASE: LDH: 139 U/L (ref 98–192)

## 2022-12-01 MED ORDER — SENNOSIDES-DOCUSATE SODIUM 8.6-50 MG PO TABS: 1.0000 | ORAL_TABLET | Freq: Two times a day (BID) | ORAL | Status: DC

## 2022-12-01 MED ORDER — SODIUM CHLORIDE 0.9% FLUSH: 9.0000 mL | INTRAVENOUS | Status: DC | PRN

## 2022-12-01 MED ORDER — KETOROLAC TROMETHAMINE 15 MG/ML IJ SOLN
15.0000 mg | Freq: Four times a day (QID) | INTRAMUSCULAR | Status: DC
  Administered 2022-12-01: 15 mg via INTRAVENOUS
  Filled 2022-12-01: qty 1

## 2022-12-01 MED ORDER — DIPHENHYDRAMINE HCL 12.5 MG/5ML PO ELIX: 12.5000 mg | ORAL_SOLUTION | Freq: Four times a day (QID) | ORAL | Status: DC | PRN

## 2022-12-01 MED ORDER — HYDROMORPHONE 1 MG/ML IV SOLN
INTRAVENOUS | Status: DC
  Administered 2022-12-01: 30 mg via INTRAVENOUS
  Administered 2022-12-01: 4.1 mg via INTRAVENOUS
  Filled 2022-12-01: qty 30

## 2022-12-01 MED ORDER — DEXTROSE-NACL 5-0.45 % IV SOLN: INTRAVENOUS | Status: DC

## 2022-12-01 MED ORDER — POLYETHYLENE GLYCOL 3350 17 G PO PACK: 17.0000 g | PACK | Freq: Every day | ORAL | Status: DC | PRN

## 2022-12-01 MED ORDER — DIPHENHYDRAMINE HCL 50 MG/ML IJ SOLN: 12.5000 mg | Freq: Four times a day (QID) | INTRAMUSCULAR | Status: DC | PRN

## 2022-12-01 MED ORDER — NALOXONE HCL 0.4 MG/ML IJ SOLN: 0.4000 mg | INTRAMUSCULAR | Status: DC | PRN

## 2022-12-01 MED ORDER — OXYCODONE HCL 10 MG PO TABS
10.0000 mg | ORAL_TABLET | ORAL | 0 refills | Status: AC | PRN
Start: 2022-12-01 — End: 2022-12-02

## 2022-12-01 MED ORDER — ONDANSETRON HCL 4 MG/2ML IJ SOLN
4.0000 mg | Freq: Four times a day (QID) | INTRAMUSCULAR | Status: DC | PRN
  Administered 2022-12-01: 4 mg via INTRAVENOUS
  Filled 2022-12-01: qty 2

## 2022-12-01 NOTE — Telephone Encounter (Signed)
Patient called requesting to come to the day infusion hospital for sickle cell pain. Patient reports right arm pain rated 7/10. Patient was treated for pain crisis in the ER on yesterday. Reports being out of Oxycodone and last taking it yesterday. COVID-19 screening done and patient denies all symptoms and exposures. Denies fever, chest pain, nausea, vomiting, diarrhea, abdominal pain and priapism. Admits to having transportation without driving self. Dr. Mikeal Hawthorne notified. Patient can come to the day hospital for pain management. Patient advised and expresses an understanding.

## 2022-12-01 NOTE — Progress Notes (Signed)
Patient admitted to the day infusion hospital for sickle cell pain. Patient initially reported right arm pain rated 7/10. For pain management, patient placed on Full-Dose Dilaudid PCA, given 15 mg IV Toradol and hydrated with IV fluids. At discharge, patient rated pain at 4/10. Vital signs stable. AVS offered but patient refused. Patient alert, oriented and ambulatory at discharge.

## 2022-12-01 NOTE — Discharge Summary (Signed)
Physician Discharge Summary   Patient: Jonathan [name] MRN: [id number] DOB: 12/25/2000  Admit date:     12/01/2022  Discharge date: {dischdate:26783}  Discharge Physician: Lonia BloodGARBA,LAWAL   PCP: Quentin AngstJegede, Olugbemiga E, MD   Recommendations at discharge:  {Tip this will not be part of the note when signed- Example include specific recommendations for outpatient follow-up, pending tests to follow-up on. (Optional):26781}  ***  Discharge Diagnoses: Active Problems:   Sickle cell anemia with crisis  Resolved Problems:   * No resolved hospital problems. [facility]*  Hospital Course: No notes on file  Assessment and Plan: No notes have been filed under this hospital service. Service: Hospitalist     {Tip this will not be part of the note when signed There is no height or weight on file to calculate BMI. , ,  (Optional):26781}  {(NOTE) Pain control PDMP Statment (Optional):26782} Consultants: *** Procedures performed: ***  Disposition: {Plan; Disposition:26390} Diet recommendation:  Discharge Diet Orders (From admission, onward)     Start     Ordered   12/01/22 0000  Diet - low sodium heart healthy        12/01/22 1551           {Diet_Plan:26776} DISCHARGE MEDICATION: Allergies as of 12/01/2022       Reactions   Allergy Medication [diphenhydramine] Swelling, Other (See Comments)   Face swells.         Medication List     TAKE these medications    Oxycodone HCl 10 MG Tabs Take 1 tablet (10 mg total) by mouth every 4 (four) hours as needed for up to 1 day (pain).        Discharge Exam: There were no vitals filed for this visit. ***  Condition at discharge: {DC Condition:26389}  The results of significant diagnostics from this hospitalization (including imaging, microbiology, ancillary and laboratory) are listed below for reference.   Imaging Studies: UE VENOUS DUPLEX (7am - 7pm)  Result Date: 11/30/2022 UPPER VENOUS STUDY  Patient Name:  Jonathan [name]  Date of  Exam:   11/30/2022 Medical Rec #: [id number]         Accession #:    [id number] Date of Birth: 24/20/2002         Patient Gender: M Patient Age:   [age] years [facility] Procedure:      VAS US UPPER EXTREMITY VENOUS DUPLEX Referring Phys: Evlyn KannerJAMES SCHUMAN --------------------------------------------------------------------------------  Indications: Pain Risk Factors: None identified. Comparison Study: No prior studies. Performing Technologist: Chanda BusingGregory Collins RVT  Examination Guidelines: A complete evaluation includes B-mode imaging, spectral Doppler, color Doppler, and power Doppler as needed of all accessible portions of each vessel. Bilateral testing is considered an integral part of a complete examination. Limited examinations for reoccurring indications may be performed as noted.  Right Findings: +----------+------------+---------+-----------+----------+-------+ RIGHT     CompressiblePhasicitySpontaneousPropertiesSummary +----------+------------+---------+-----------+----------+-------+ IJV           Full       Yes       Yes                      +----------+------------+---------+-----------+----------+-------+ Subclavian    Full       Yes       Yes                      +----------+------------+---------+-----------+----------+-------+ Axillary      Full       Yes  Yes                      +----------+------------+---------+-----------+----------+-------+ Brachial      Full       Yes       Yes                      +----------+------------+---------+-----------+----------+-------+ Radial        Full                                          +----------+------------+---------+-----------+----------+-------+ Ulnar         Full                                          +----------+------------+---------+-----------+----------+-------+ Cephalic      Full                                           +----------+------------+---------+-----------+----------+-------+ Basilic       Full                                          +----------+------------+---------+-----------+----------+-------+  Left Findings: +----------+------------+---------+-----------+----------+-------+ LEFT      CompressiblePhasicitySpontaneousPropertiesSummary +----------+------------+---------+-----------+----------+-------+ Subclavian    Full       Yes       Yes                      +----------+------------+---------+-----------+----------+-------+  Summary:  Right: No evidence of deep vein thrombosis in the upper extremity. No evidence of superficial vein thrombosis in the upper extremity.  Left: No evidence of thrombosis in the subclavian.  *See table(s) above for measurements and observations.  Diagnosing physician: Waverly Ferrari MD Electronically signed by Waverly Ferrari MD on 11/30/2022 at 2:22:38 PM.    Final    DG Chest 2 View  Result Date: 11/30/2022 CLINICAL DATA:  Back and right arm pain.  Sickle cell disease. EXAM: CHEST - 2 VIEW COMPARISON:  08/11/2022 FINDINGS: Normal sized heart with an interval decrease in size. Interval linear densities in the left lower lobe. Stable linear scarring in the lingula. Otherwise, clear lungs. Bony changes of sickle cell disease. IMPRESSION: Interval mild left lower lobe atelectasis. Electronically Signed   By: Beckie Salts M.D.   On: 11/30/2022 09:11    Microbiology: Results for orders placed or performed during the hospital encounter of 08/11/22  Resp panel by RT-PCR (RSV, Flu A&B, Covid) Anterior Nasal Swab     Status: Abnormal   Collection Time: 08/11/22  8:35 PM   Specimen: Anterior Nasal Swab  Result Value Ref Range Status   SARS Coronavirus 2 by RT PCR NEGATIVE NEGATIVE Final    Comment: (NOTE) SARS-CoV-2 target nucleic acids are NOT DETECTED.  The SARS-CoV-2 RNA is generally detectable in upper respiratory specimens during the acute phase of  infection. The lowest concentration of SARS-CoV-2 viral copies this assay can detect is 138 copies/mL. A negative result does not preclude SARS-Cov-2 infection and should not be used as the sole basis for treatment or other patient  management decisions. A negative result may occur with  improper specimen collection/handling, submission of specimen other than nasopharyngeal swab, presence of viral mutation(s) within the areas targeted by this assay, and inadequate number of viral copies(<138 copies/mL). A negative result must be combined with clinical observations, patient history, and epidemiological information. The expected result is Negative.  Fact Sheet for Patients:  BloggerCourse.com  Fact Sheet for Healthcare Providers:  SeriousBroker.it  This test is no t yet approved or cleared by the [place] FDA and  has been authorized for detection and/or diagnosis of SARS-CoV-2 by FDA under an Emergency Use Authorization (EUA). This EUA will remain  in effect (meaning this test can be used) for the duration of the COVID-19 declaration under Section 564(b)(1) of the Act, 21 U.S.C.section 360bbb-3(b)(1), unless the authorization is terminated  or revoked sooner.       Influenza A by PCR POSITIVE (A) NEGATIVE Final   Influenza B by PCR NEGATIVE NEGATIVE Final    Comment: (NOTE) The Xpert Xpress SARS-CoV-2/FLU/RSV plus assay is intended as an aid in the diagnosis of influenza from Nasopharyngeal swab specimens and should not be used as a sole basis for treatment. Nasal washings and aspirates are unacceptable for Xpert Xpress SARS-CoV-2/FLU/RSV testing.  Fact Sheet for Patients: BloggerCourse.com  Fact Sheet for Healthcare Providers: SeriousBroker.it  This test is not yet approved or cleared by the [place] FDA and has been authorized for detection and/or diagnosis of  SARS-CoV-2 by FDA under an Emergency Use Authorization (EUA). This EUA will remain in effect (meaning this test can be used) for the duration of the COVID-19 declaration under Section 564(b)(1) of the Act, 21 U.S.C. section 360bbb-3(b)(1), unless the authorization is terminated or revoked.     Resp Syncytial Virus by PCR NEGATIVE NEGATIVE Final    Comment: (NOTE) Fact Sheet for Patients: BloggerCourse.com  Fact Sheet for Healthcare Providers: SeriousBroker.it  This test is not yet approved or cleared by the [place] FDA and has been authorized for detection and/or diagnosis of SARS-CoV-2 by FDA under an Emergency Use Authorization (EUA). This EUA will remain in effect (meaning this test can be used) for the duration of the COVID-19 declaration under Section 564(b)(1) of the Act, 21 U.S.C. section 360bbb-3(b)(1), unless the authorization is terminated or revoked.  Performed at [facility], [address], [place], [place] [address]   MRSA Next Gen by PCR, Nasal     Status: None   Collection Time: 08/13/22  9:48 PM   Specimen: Nasal Swab  Result Value Ref Range Status   MRSA by PCR Next Gen NOT DETECTED NOT DETECTED Final    Comment: (NOTE) The GeneXpert MRSA Assay (FDA approved for NASAL specimens only), is one component of a comprehensive MRSA colonization surveillance program. It is not intended to diagnose MRSA infection nor to guide or monitor treatment for MRSA infections. Test performance is not FDA approved in patients less than 36 years old. Performed at [facility], [address] [place], [place] [address]     Labs: CBC: Recent Labs  Lab 11/30/22 0816 12/01/22 1130  WBC 8.1 9.7  NEUTROABS 4.0 5.8  HGB 9.7* 10.1*  HCT 29.4* 30.1*  MCV 86.5 85.8  PLT 492* 505*   Basic Metabolic Panel: Recent Labs  Lab 11/30/22 0816  NA 139  K 3.6  CL 108  CO2 23   GLUCOSE 110*  BUN 6  CREATININE 0.84  CALCIUM 8.9   Liver Function Tests: Recent Labs  Lab 11/30/22 0816  AST 19  ALT 9  ALKPHOS 117  BILITOT 0.8  PROT 7.4  ALBUMIN 4.3   CBG: No results for input(s): "GLUCAP" in the last 168 hours.  Discharge time spent: {LESS THAN/GREATER XQJJ:94174} 30 minutes.  SignedLonia Blood, MD [facility] 12/01/2022

## 2022-12-02 ENCOUNTER — Ambulatory Visit (INDEPENDENT_AMBULATORY_CARE_PROVIDER_SITE_OTHER): Payer: Commercial Managed Care - PPO | Admitting: Family Medicine

## 2022-12-02 ENCOUNTER — Encounter: Payer: Self-pay | Admitting: Family Medicine

## 2022-12-02 ENCOUNTER — Non-Acute Institutional Stay (HOSPITAL_COMMUNITY)
Admission: AD | Admit: 2022-12-02 | Discharge: 2022-12-02 | Disposition: A | Discharge status: Home / Self Care | Payer: Commercial Managed Care - PPO | Source: Ambulatory Visit | Attending: Internal Medicine | Admitting: Internal Medicine

## 2022-12-02 VITALS — BP 121/81 | HR 81 | Temp 97.3°F | Ht 65.0 in | Wt 145.6 lb

## 2022-12-02 DIAGNOSIS — G894 Chronic pain syndrome: Secondary | ICD-10-CM | POA: Diagnosis not present

## 2022-12-02 DIAGNOSIS — Z79891 Long term (current) use of opiate analgesic: Secondary | ICD-10-CM

## 2022-12-02 DIAGNOSIS — E559 Vitamin D deficiency, unspecified: Secondary | ICD-10-CM

## 2022-12-02 DIAGNOSIS — D638 Anemia in other chronic diseases classified elsewhere: Secondary | ICD-10-CM | POA: Insufficient documentation

## 2022-12-02 DIAGNOSIS — D5700 Hb-SS disease with crisis, unspecified: Secondary | ICD-10-CM | POA: Diagnosis present

## 2022-12-02 LAB — RETICULOCYTES
Immature Retic Fract: 43.6 % — ABNORMAL HIGH (ref 2.3–15.9)
RBC.: 3.51 MIL/uL — ABNORMAL LOW (ref 4.22–5.81)
Retic Count, Absolute: 179.7 10*3/uL (ref 19.0–186.0)
Retic Ct Pct: 5.1 % — ABNORMAL HIGH (ref 0.4–3.1)

## 2022-12-02 LAB — CBC WITH DIFFERENTIAL/PLATELET
Abs Immature Granulocytes: 0.02 10*3/uL (ref 0.00–0.07)
Basophils Absolute: 0.1 10*3/uL (ref 0.0–0.1)
Basophils Relative: 1 %
Eosinophils Absolute: 0.3 10*3/uL (ref 0.0–0.5)
Eosinophils Relative: 3 %
HCT: 30 % — ABNORMAL LOW (ref 39.0–52.0)
Hemoglobin: 10 g/dL — ABNORMAL LOW (ref 13.0–17.0)
Immature Granulocytes: 0 %
Lymphocytes Relative: 30 %
Lymphs Abs: 2.9 10*3/uL (ref 0.7–4.0)
MCH: 28.8 pg (ref 26.0–34.0)
MCHC: 33.3 g/dL (ref 30.0–36.0)
MCV: 86.5 fL (ref 80.0–100.0)
Monocytes Absolute: 0.6 10*3/uL (ref 0.1–1.0)
Monocytes Relative: 7 %
Neutro Abs: 5.8 10*3/uL (ref 1.7–7.7)
Neutrophils Relative %: 59 %
Platelets: 516 10*3/uL — ABNORMAL HIGH (ref 150–400)
RBC: 3.47 MIL/uL — ABNORMAL LOW (ref 4.22–5.81)
RDW: 21.7 % — ABNORMAL HIGH (ref 11.5–15.5)
WBC: 9.7 10*3/uL (ref 4.0–10.5)
nRBC: 1.4 % — ABNORMAL HIGH (ref 0.0–0.2)

## 2022-12-02 LAB — COMPREHENSIVE METABOLIC PANEL
ALT: 7 U/L (ref 0–44)
AST: 13 U/L — ABNORMAL LOW (ref 15–41)
Albumin: 4.3 g/dL (ref 3.5–5.0)
Alkaline Phosphatase: 118 U/L (ref 38–126)
Anion gap: 9 (ref 5–15)
BUN: 7 mg/dL (ref 6–20)
CO2: 23 mmol/L (ref 22–32)
Calcium: 9.1 mg/dL (ref 8.9–10.3)
Chloride: 107 mmol/L (ref 98–111)
Creatinine, Ser: 0.69 mg/dL (ref 0.61–1.24)
GFR, Estimated: >60 mL/min (ref >60)
Glucose, Bld: 90 mg/dL (ref 70–99)
Potassium: 3.5 mmol/L (ref 3.5–5.1)
Sodium: 139 mmol/L (ref 135–145)
Total Bilirubin: 0.9 mg/dL (ref 0.3–1.2)
Total Protein: 7.3 g/dL (ref 6.5–8.1)

## 2022-12-02 MED ORDER — NALOXONE HCL 0.4 MG/ML IJ SOLN: 0.4000 mg | INTRAMUSCULAR | Status: DC | PRN

## 2022-12-02 MED ORDER — HYDROXYUREA 500 MG PO CAPS
500.0000 mg | ORAL_CAPSULE | Freq: Every day | ORAL | 3 refills | Status: DC
Start: 2022-12-02 — End: 2023-01-02

## 2022-12-02 MED ORDER — SODIUM CHLORIDE 0.45 % IV SOLN: INTRAVENOUS | Status: DC

## 2022-12-02 MED ORDER — ONDANSETRON HCL 4 MG/2ML IJ SOLN
4.0000 mg | Freq: Four times a day (QID) | INTRAMUSCULAR | Status: DC | PRN
  Administered 2022-12-02: 4 mg via INTRAVENOUS
  Filled 2022-12-02: qty 2

## 2022-12-02 MED ORDER — ACETAMINOPHEN 500 MG PO TABS
1000.0000 mg | ORAL_TABLET | Freq: Once | ORAL | Status: AC
  Administered 2022-12-02: 1000 mg via ORAL
  Filled 2022-12-02: qty 2

## 2022-12-02 MED ORDER — KETOROLAC TROMETHAMINE 30 MG/ML IJ SOLN
15.0000 mg | Freq: Once | INTRAMUSCULAR | Status: AC
  Administered 2022-12-02: 15 mg via INTRAVENOUS
  Filled 2022-12-02: qty 1

## 2022-12-02 MED ORDER — IBUPROFEN 800 MG PO TABS
800.0000 mg | ORAL_TABLET | Freq: Three times a day (TID) | ORAL | 1 refills | Status: DC | PRN
Start: 2022-12-02 — End: 2023-01-26

## 2022-12-02 MED ORDER — FOLIC ACID 1 MG PO TABS
1.0000 mg | ORAL_TABLET | Freq: Every day | ORAL | 3 refills | Status: AC
Start: 2022-12-02 — End: 2023-12-02

## 2022-12-02 MED ORDER — HYDROMORPHONE 1 MG/ML IV SOLN
INTRAVENOUS | Status: DC
  Administered 2022-12-02: 30 mg via INTRAVENOUS
  Administered 2022-12-02: 6 mg via INTRAVENOUS
  Filled 2022-12-02: qty 30

## 2022-12-02 MED ORDER — SODIUM CHLORIDE 0.9% FLUSH: 9.0000 mL | INTRAVENOUS | Status: DC | PRN

## 2022-12-02 NOTE — Discharge Summary (Signed)
[facility] Discharge Summary   Patient ID: Jonathan [name] MRN: [id number] DOB/AGE: 24/02/2001 22 y.o.  Admit date: 12/02/2022 Discharge date: 12/02/2022  Primary Care Physician:  Quentin AngstJegede, Olugbemiga E, MD  Admission Diagnoses:  Active Problems:   Sickle cell pain crisis   Discharge Medications:  Allergies as of 12/02/2022       Reactions   Allergy Medication [diphenhydramine] Swelling, Other (See Comments)   Face swells.         Medication List     TAKE these medications    folic acid 1 MG tablet Commonly known as: FOLVITE Take 1 tablet (1 mg total) by mouth daily.   hydroxyurea 500 MG capsule Commonly known as: HYDREA Take 1 capsule (500 mg total) by mouth daily. May take with food to minimize GI side effects.   ibuprofen 800 MG tablet Commonly known as: ADVIL Take 1 tablet (800 mg total) by mouth every 8 (eight) hours as needed.   Oxycodone HCl 10 MG Tabs Take 1 tablet (10 mg total) by mouth every 4 (four) hours as needed for up to 1 day (pain).         Consults:  None  Significant Diagnostic Studies:  UE VENOUS DUPLEX (7am - 7pm)  Result Date: 11/30/2022 UPPER VENOUS STUDY  Patient Name:  Jonathan [name] Jonathan [name]  Date of Exam:   11/30/2022 Medical Rec #: [id number]         Accession #:    [id number] Date of Birth: 04/13/2001         Patient Gender: M Patient Age:   [age] years [facility] Procedure:      VAS US UPPER EXTREMITY VENOUS DUPLEX Referring Phys: Evlyn KannerJAMES SCHUMAN --------------------------------------------------------------------------------  Indications: Pain Risk Factors: None identified. Comparison Study: No prior studies. Performing Technologist: Chanda BusingGregory Collins RVT  Examination Guidelines: A complete evaluation includes B-mode imaging, spectral Doppler, color Doppler, and power Doppler as needed of all accessible portions of each vessel. Bilateral testing is considered an integral part of a complete examination. Limited  examinations for reoccurring indications may be performed as noted.  Right Findings: +----------+------------+---------+-----------+----------+-------+ RIGHT     CompressiblePhasicitySpontaneousPropertiesSummary +----------+------------+---------+-----------+----------+-------+ IJV           Full       Yes       Yes                      +----------+------------+---------+-----------+----------+-------+ Subclavian    Full       Yes       Yes                      +----------+------------+---------+-----------+----------+-------+ Axillary      Full       Yes       Yes                      +----------+------------+---------+-----------+----------+-------+ Brachial      Full       Yes       Yes                      +----------+------------+---------+-----------+----------+-------+ Radial        Full                                          +----------+------------+---------+-----------+----------+-------+ Ulnar  Full                                          +----------+------------+---------+-----------+----------+-------+ Cephalic      Full                                          +----------+------------+---------+-----------+----------+-------+ Basilic       Full                                          +----------+------------+---------+-----------+----------+-------+  Left Findings: +----------+------------+---------+-----------+----------+-------+ LEFT      CompressiblePhasicitySpontaneousPropertiesSummary +----------+------------+---------+-----------+----------+-------+ Subclavian    Full       Yes       Yes                      +----------+------------+---------+-----------+----------+-------+  Summary:  Right: No evidence of deep vein thrombosis in the upper extremity. No evidence of superficial vein thrombosis in the upper extremity.  Left: No evidence of thrombosis in the subclavian.  *See table(s) above for measurements and  observations.  Diagnosing physician: Waverly Ferrari MD Electronically signed by Waverly Ferrari MD on 11/30/2022 at 2:22:38 PM.    Final    DG Chest 2 View  Result Date: 11/30/2022 CLINICAL DATA:  Back and right arm pain.  Sickle cell disease. EXAM: CHEST - 2 VIEW COMPARISON:  08/11/2022 FINDINGS: Normal sized heart with an interval decrease in size. Interval linear densities in the left lower lobe. Stable linear scarring in the lingula. Otherwise, clear lungs. Bony changes of sickle cell disease. IMPRESSION: Interval mild left lower lobe atelectasis. Electronically Signed   By: Beckie Salts M.D.   On: 11/30/2022 09:11     History of present illness: [name] is a 22 year old male with a medical history significant for sickle cell disease, chronic pain syndrome, and anemia of chronic disease presents to sickle cell day clinic with complaints of right upper extremity pain over the past 5 days.  Patient has been evaluated in the emergency department on 11/30/2022 and was admitted to sickle cell day clinic on yesterday.  Patient states that pain was somewhat controlled at discharge, however he did not have any home pain medications and pain returned to right upper extremity, which he rates at 6/10, constant, and aching.  Patient underwent venous Dopplers of right upper extremities, which showed no evidence of DVT.  Patient has been taking over-the-counter Tylenol without very much relief.  He denies any headache, chest pain, urinary symptoms, nausea, vomiting, or diarrhea.  Patient transitioned from primary care.  [facility] Course: Patient admitted to sickle cell day infusion clinic for management of pain crisis. Reviewed all laboratory values, improved from previous. Pain managed with IV Dilaudid PCA, IV fluids, IV Toradol, and Tylenol.  Pain intensity decreased to 2/10 and patient is requesting discharge home. Patient will discharge home with oxycodone 10 mg every 6 hours,  which was sent to his pharmacy on yesterday.  In addition, added ibuprofen 800 mg every 8 hours as needed with food, folic acid 1 mg daily, and restarted patient's hydroxyurea, which are all indicated in primary care note. He is alert, oriented, and ambulating without  assistance. Will discharge home in a hemodynamically stable condition.  Discharge instructions: Resume all home medications.   Follow up with PCP as previously  scheduled.   Discussed the importance of drinking 64 ounces of water daily, dehydration of red blood cells may lead further sickling.   Avoid all stressors that precipitate sickle cell pain crisis.     The patient was given clear instructions to go to ER or return to medical center if symptoms do not improve, worsen or new problems develop.     Physical Exam at Discharge:  BP 118/66 (BP Location: Left Arm)   Pulse 68   Temp 98.5 F (36.9 C) (Temporal)   Resp 15   SpO2 97%  Physical Exam Constitutional:      Appearance: Normal appearance.  Eyes:     Pupils: Pupils are equal, round, and reactive to light.  Cardiovascular:     Rate and Rhythm: Normal rate and regular rhythm.     Pulses: Normal pulses.  Pulmonary:     Effort: Pulmonary effort is normal.  Abdominal:     General: Bowel sounds are normal.  Musculoskeletal:     Right forearm: Tenderness present. No swelling or edema.     Left forearm: No swelling or edema.  Skin:    General: Skin is warm.  Neurological:     General: No focal deficit present.     Mental Status: He is alert. Mental status is at baseline.  Psychiatric:        Mood and Affect: Mood normal.        Thought Content: Thought content normal.        Judgment: Judgment normal.       Disposition at Discharge: Discharge disposition: 01-Home or Self Care       Discharge Orders: Discharge Instructions     Discharge patient   Complete by: As directed    Discharge disposition: 01-Home or Self Care   Discharge patient  date: 12/02/2022       Condition at Discharge:   Stable  Time spent on Discharge:  Greater than 30 minutes.  Signed: [name]  APRN, MSN, FNP-C Patient Care [facility] [address] [place], [place] [address] [phone]  12/02/2022, 4:01 PM

## 2022-12-02 NOTE — H&P (Signed)
[facility] History and Physical   Date: 12/02/2022  Patient name: Jonathan [name] Medical record number: [id number] Date of birth: Jan 08, 2001 Age: 22 y.o. Gender: male PCP: Quentin Angst, MD  Attending physician: Quentin Angst, MD  Chief Complaint: Sickle cell pain   History of present illness: Jonathan [name] is a 22 year old male with a medical history significant for sickle cell disease, chronic pain syndrome, and anemia of chronic disease presents to sickle cell day clinic with complaints of right upper extremity pain over the past 5 days.  Patient has been evaluated in the emergency department on 11/30/2022 and was admitted to sickle cell day clinic on yesterday.  Patient states that pain was somewhat controlled at discharge, however he did not have any home pain medications and pain returned to right upper extremity, which he rates at 6/10, constant, and aching.  Patient underwent venous Dopplers of right upper extremities, which showed no evidence of DVT.  Patient has been taking over-the-counter Tylenol without very much relief.  He denies any headache, chest pain, urinary symptoms, nausea, vomiting, or diarrhea.  Patient transitioned from primary care.  Meds: Medications Prior to Admission  Medication Sig Dispense Refill Last Dose   folic acid (FOLVITE) 1 MG tablet Take 1 tablet (1 mg total) by mouth daily. 90 tablet 3    hydroxyurea (HYDREA) 500 MG capsule Take 1 capsule (500 mg total) by mouth daily. May take with food to minimize GI side effects. 90 capsule 3    ibuprofen (ADVIL) 800 MG tablet Take 1 tablet (800 mg total) by mouth every 8 (eight) hours as needed. 30 tablet 1    Oxycodone HCl 10 MG TABS Take 1 tablet (10 mg total) by mouth every 4 (four) hours as needed for up to 1 day (pain). 3 tablet 0     Allergies: Allergy medication [diphenhydramine] Past Medical History:  Diagnosis Date   Acute chest syndrome(517.3) 08/26/2011   march 2013, Sept  2013   Asthma    Avascular necrosis of bone of left hip    Sickle cell disease, type SS    Past Surgical History:  Procedure Laterality Date   ADENOIDECTOMY     SPLENECTOMY     TONSILLECTOMY     TONSILLECTOMY AND ADENOIDECTOMY     TOTAL HIP ARTHROPLASTY Left 03/07/2016   UMBILICAL HERNIA REPAIR     Family History  Problem Relation Age of Onset   Hypertension Mother    Hypertension Maternal Grandmother    Hypertension Maternal Grandfather    Asthma Paternal Grandfather    Diabetes Paternal Grandfather    Social History   Socioeconomic History   Marital status: Single    Spouse name: Not on file   Number of children: Not on file   Years of education: Not on file   Highest education level: Not on file  Occupational History   Not on file  Tobacco Use   Smoking status: Never   Smokeless tobacco: Never  Vaping Use   Vaping Use: Never used  Substance and Sexual Activity   Alcohol use: No    Alcohol/week: 0.0 standard drinks of alcohol   Drug use: No   Sexual activity: Never  Other Topics Concern   Not on file  Social History Narrative   Lives with mother.  No smokers at home      Update:       05/02/16 Lives with Mother only   Social Determinants of Corporate investment banker  Strain: Not on file  Food Insecurity: No Food Insecurity (08/12/2022)   Hunger Vital Sign    Worried About Running Out of Food in the Last Year: Never true    Ran Out of Food in the Last Year: Never true  Transportation Needs: Unmet Transportation Needs (08/12/2022)   PRAPARE - Administrator, Civil ServiceTransportation    Lack of Transportation (Medical): Yes    Lack of Transportation (Non-Medical): Yes  Physical Activity: Not on file  Stress: Not on file  Social Connections: Not on file  Intimate Partner Violence: Not At Risk (08/12/2022)   Humiliation, Afraid, Rape, and Kick questionnaire    Fear of Current or Ex-Partner: No    Emotionally Abused: No    Physically Abused: No    Sexually Abused: No   Review  of Systems  Constitutional: Negative.  Negative for chills and fever.  HENT: Negative.    Eyes: Negative.   Respiratory: Negative.    Cardiovascular: Negative.   Gastrointestinal: Negative.   Genitourinary: Negative.   Musculoskeletal:  Positive for back pain and joint pain.  Skin: Negative.   Neurological: Negative.   Endo/Heme/Allergies: Negative.   Psychiatric/Behavioral: Negative.      Physical Exam: Blood pressure (!) 136/91, pulse 92, temperature 98.5 F (36.9 C), temperature source Temporal, resp. rate 16, SpO2 100 %. Physical Exam Constitutional:      Appearance: Normal appearance.  Eyes:     Pupils: Pupils are equal, round, and reactive to light.  Cardiovascular:     Rate and Rhythm: Normal rate and regular rhythm.     Pulses: Normal pulses.  Pulmonary:     Effort: Pulmonary effort is normal.  Abdominal:     General: Bowel sounds are normal.  Musculoskeletal:        General: Normal range of motion.  Skin:    General: Skin is warm.  Neurological:     General: No focal deficit present.     Mental Status: He is alert. Mental status is at baseline.  Psychiatric:        Mood and Affect: Mood normal.        Behavior: Behavior normal.        Thought Content: Thought content normal.        Judgment: Judgment normal.      Lab results: Results for orders placed or performed during the hospital encounter of 12/01/22 (from the past 24 hour(s))  Lactate dehydrogenase     Status: None   Collection Time: 12/01/22 11:30 AM  Result Value Ref Range   LDH 139 98 - 192 U/L  CBC with Differential     Status: Abnormal   Collection Time: 12/01/22 11:30 AM  Result Value Ref Range   WBC 9.7 4.0 - 10.5 K/uL   RBC 3.51 (L) 4.22 - 5.81 MIL/uL   Hemoglobin 10.1 (L) 13.0 - 17.0 g/dL   HCT 16.130.1 (L) 09.639.0 - 04.552.0 %   MCV 85.8 80.0 - 100.0 fL   MCH 28.8 26.0 - 34.0 pg   MCHC 33.6 30.0 - 36.0 g/dL   RDW 40.921.7 (H) 81.111.5 - 91.415.5 %   Platelets 505 (H) 150 - 400 K/uL   nRBC 1.1 (H) 0.0  - 0.2 %   Neutrophils Relative % 60 %   Neutro Abs 5.8 1.7 - 7.7 K/uL   Lymphocytes Relative 28 %   Lymphs Abs 2.8 0.7 - 4.0 K/uL   Monocytes Relative 7 %   Monocytes Absolute 0.7 0.1 - 1.0 K/uL   Eosinophils Relative  4 %   Eosinophils Absolute 0.4 0.0 - 0.5 K/uL   Basophils Relative 1 %   Basophils Absolute 0.1 0.0 - 0.1 K/uL   Immature Granulocytes 0 %   Abs Immature Granulocytes 0.02 0.00 - 0.07 K/uL   Polychromasia PRESENT    Sickle Cells PRESENT    Target Cells PRESENT     Imaging results:  UE VENOUS DUPLEX (7am - 7pm)  Result Date: 11/30/2022 UPPER VENOUS STUDY  Patient Name:  TIA GELB  Date of Exam:   11/30/2022 Medical Rec #: [id number]         Accession #:    [id number] Date of Birth: 10/03/2000         Patient Gender: M Patient Age:   52 years Exam Location:  [facility] Procedure:      VAS [place] UPPER EXTREMITY VENOUS DUPLEX Referring Phys: Evlyn Kanner --------------------------------------------------------------------------------  Indications: Pain Risk Factors: None identified. Comparison Study: No prior studies. Performing Technologist: Chanda Busing RVT  Examination Guidelines: A complete evaluation includes B-mode imaging, spectral Doppler, color Doppler, and power Doppler as needed of all accessible portions of each vessel. Bilateral testing is considered an integral part of a complete examination. Limited examinations for reoccurring indications may be performed as noted.  Right Findings: +----------+------------+---------+-----------+----------+-------+ RIGHT     CompressiblePhasicitySpontaneousPropertiesSummary +----------+------------+---------+-----------+----------+-------+ IJV           Full       Yes       Yes                      +----------+------------+---------+-----------+----------+-------+ Subclavian    Full       Yes       Yes                      +----------+------------+---------+-----------+----------+-------+ Axillary       Full       Yes       Yes                      +----------+------------+---------+-----------+----------+-------+ Brachial      Full       Yes       Yes                      +----------+------------+---------+-----------+----------+-------+ Radial        Full                                          +----------+------------+---------+-----------+----------+-------+ Ulnar         Full                                          +----------+------------+---------+-----------+----------+-------+ Cephalic      Full                                          +----------+------------+---------+-----------+----------+-------+ Basilic       Full                                          +----------+------------+---------+-----------+----------+-------+  Left Findings: +----------+------------+---------+-----------+----------+-------+ LEFT      CompressiblePhasicitySpontaneousPropertiesSummary +----------+------------+---------+-----------+----------+-------+ Subclavian    Full       Yes       Yes                      +----------+------------+---------+-----------+----------+-------+  Summary:  Right: No evidence of deep vein thrombosis in the upper extremity. No evidence of superficial vein thrombosis in the upper extremity.  Left: No evidence of thrombosis in the subclavian.  *See table(s) above for measurements and observations.  Diagnosing physician: Waverly Ferrari MD Electronically signed by Waverly Ferrari MD on 11/30/2022 at 2:22:38 PM.    Final      Assessment & Plan: Patient admitted to sickle cell day infusion center for management of pain crisis.  Patient is opiate tolerant Initiate IV dilaudid PCA.  IV fluids, 0.45% saline at 100 mL/h Toradol 15 mg IV times one dose Tylenol 1000 mg by mouth times one dose Review CBC with differential, complete metabolic panel, and reticulocytes as results become available. Pain intensity will be reevaluated in  context of functioning and relationship to baseline as care progresses If pain intensity remains elevated and/or sudden change in hemodynamic stability transition to inpatient services for higher level of care.    [name]  APRN, MSN, FNP-C Patient Care [facility] [address] [place], [place] [address] [phone]  12/02/2022, 11:11 AM

## 2022-12-02 NOTE — Patient Instructions (Signed)
Will restart hydroxyurea at 500 mg daily. Follow up in 4 weeks for labs. Please be sure to schedule your Adakveo infusions monthly.  Restart folic acid 1 mg daily  Ibuprofen 800 mg three times daily as needed with food.

## 2022-12-02 NOTE — Progress Notes (Signed)
Patient admitted to the day infusion hospital for sickle cell pain. Patient initially reported right arm pain rated 5/10. For pain management, patient placed on Sickle Cell Dose Dilaudid PCA, given IV Toradol, Tylenol and hydrated with IV fluids. At discharge, patient rated pain at 2/10. Vital signs stable. AVS offered but patient refused. Patient alert, oriented and ambulatory at discharge.

## 2022-12-02 NOTE — Progress Notes (Signed)
Established Patient Office Visit  Subjective   Patient ID: Jonathan [name], male    DOB: [birth year]  Age: 22 y.o. MRN: [id number]  Chief Complaint  Patient presents with   Sickle Cell Anemia    Follow up     Marta Marsan is a very pleasant 22 year old male with a medical history significant for sickle cell disease, chronic pain syndrome, and anemia of chronic disease that presents for follow-up of chronic conditions.  Patient states that he has been experiencing excruciating right upper extremity pain over the past several days.  Patient was evaluated in the emergency department on 11/30/2022 and admitted to sickle cell day infusion clinic on 12/01/2022.  He has not had any sustained relief with right arm pain.  He has also been out of his home pain medications.  Today, patient is requesting to return to day hospital for further treatment.  On 11/30/2022, patient underwent right upper extremity Dopplers, which did not show any evidence of deep vein thrombosis.  Today, right arm pain intensity is 6/10, constant, and aching. Patient denies any headache, fever, chills, chest pain, urinary symptoms, nausea, vomiting, or diarrhea.    Patient Active Problem List   Diagnosis Date Noted   Elevated AST (SGOT) 08/19/2022   Elevated random blood glucose level 08/19/2022   Chronic pain 06/11/2022   Community acquired pneumonia 02/12/2022   Pulmonary nodule 02/12/2022   Sickle cell anemia with crisis 09/02/2021   Sickle cell crisis 07/27/2021   COVID-19 virus infection 07/27/2021   Anemia of chronic disease 06/27/2021   Thrombocytosis 06/27/2021   Mild intermittent asthma without complication 11/11/2020   Sickle cell disease with crisis 10/23/2020   Sickle cell pain crisis 08/16/2020   Proteinuria 04/14/2020   Influenza 10/13/2017   Sickle cell disease 04/27/2017   Failed hearing screening 03/25/2016   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   Myopic astigmatism 03/16/2015   Avascular  necrosis of bone of left hip 03/24/2013   Chronic obstruct airways disease 03/15/2013   Snoring 04/08/2012   Hypertension 11/11/2011   Reactive airway disease 11/09/2011   Sickle cell crisis acute chest syndrome 07/30/2011   Past Medical History:  Diagnosis Date   Acute chest syndrome(517.3) 08/26/2011   march 2013, Sept 2013   Asthma    Avascular necrosis of bone of left hip    Sickle cell disease, type SS    Past Surgical History:  Procedure Laterality Date   ADENOIDECTOMY     SPLENECTOMY     TONSILLECTOMY     TONSILLECTOMY AND ADENOIDECTOMY     TOTAL HIP ARTHROPLASTY Left 03/07/2016   UMBILICAL HERNIA REPAIR     Social History   Tobacco Use   Smoking status: Never   Smokeless tobacco: Never  Vaping Use   Vaping Use: Never used  Substance Use Topics   Alcohol use: No    Alcohol/week: 0.0 standard drinks of alcohol   Drug use: No   Social History   Socioeconomic History   Marital status: Single    Spouse name: Not on file   Number of children: Not on file   Years of education: Not on file   Highest education level: Not on file  Occupational History   Not on file  Tobacco Use   Smoking status: Never   Smokeless tobacco: Never  Vaping Use   Vaping Use: Never used  Substance and Sexual Activity   Alcohol use: No    Alcohol/week: 0.0 standard drinks of alcohol  Drug use: No   Sexual activity: Never  Other Topics Concern   Not on file  Social History Narrative   Lives with mother.  No smokers at home      Update:       05/02/16 Lives with Mother only   Social Determinants of Health   Financial Resource Strain: Not on file  Food Insecurity: No Food Insecurity (08/12/2022)   Hunger Vital Sign    Worried About Running Out of Food in the Last Year: Never true    Ran Out of Food in the Last Year: Never true  Transportation Needs: Unmet Transportation Needs (08/12/2022)   PRAPARE - Administrator, Civil ServiceTransportation    Lack of Transportation (Medical): Yes    Lack of  Transportation (Non-Medical): Yes  Physical Activity: Not on file  Stress: Not on file  Social Connections: Not on file  Intimate Partner Violence: Not At Risk (08/12/2022)   Humiliation, Afraid, Rape, and Kick questionnaire    Fear of Current or Ex-Partner: No    Emotionally Abused: No    Physically Abused: No    Sexually Abused: No   Family Status  Relation Name Status   Mother  (Not Specified)   MGM  (Not Specified)   MGF  (Not Specified)   PGF  (Not Specified)   Family History  Problem Relation Age of Onset   Hypertension Mother    Hypertension Maternal Grandmother    Hypertension Maternal Grandfather    Asthma Paternal Grandfather    Diabetes Paternal Grandfather    Allergies  Allergen Reactions   Allergy Medication [Diphenhydramine] Swelling and Other (See Comments)    Face swells.       Review of Systems  Constitutional: Negative.   HENT: Negative.    Respiratory: Negative.    Cardiovascular: Negative.   Gastrointestinal: Negative.   Genitourinary: Negative.   Musculoskeletal:  Positive for back pain and joint pain.  Skin: Negative.   Neurological: Negative.   Endo/Heme/Allergies: Negative.   Psychiatric/Behavioral: Negative.        Objective:     BP 121/81   Pulse 81   Temp (!) 97.3 F (36.3 C)   Ht 5\' 5"  (1.651 m)   Wt 145 lb 9.6 oz (66 kg)   SpO2 100%   BMI 24.23 kg/m  BP Readings from Last 3 Encounters:  12/02/22 121/81  12/01/22 133/79  11/30/22 122/79   Wt Readings from Last 3 Encounters:  12/02/22 145 lb 9.6 oz (66 kg)  10/30/22 151 lb (68.5 kg)  09/02/22 137 lb 3.2 oz (62.2 kg)      Physical Exam Constitutional:      Appearance: Normal appearance.  Eyes:     Pupils: Pupils are equal, round, and reactive to light.  Cardiovascular:     Rate and Rhythm: Normal rate and regular rhythm.     Pulses: Normal pulses.  Pulmonary:     Effort: Pulmonary effort is normal.  Abdominal:     General: Bowel sounds are normal.   Musculoskeletal:        General: Normal range of motion.  Skin:    General: Skin is warm.  Neurological:     General: No focal deficit present.     Mental Status: He is alert. Mental status is at baseline.  Psychiatric:        Mood and Affect: Mood normal.        Behavior: Behavior normal.        Thought Content: Thought content normal.  Judgment: Judgment normal.      No results found for any visits on 12/02/22.  Last CBC Lab Results  Component Value Date   WBC 9.7 12/01/2022   HGB 10.1 (L) 12/01/2022   HCT 30.1 (L) 12/01/2022   MCV 85.8 12/01/2022   MCH 28.8 12/01/2022   RDW 21.7 (H) 12/01/2022   PLT 505 (H) 12/01/2022   Last metabolic panel Lab Results  Component Value Date   GLUCOSE 110 (H) 11/30/2022   NA 139 11/30/2022   K 3.6 11/30/2022   CL 108 11/30/2022   CO2 23 11/30/2022   BUN 6 11/30/2022   CREATININE 0.84 11/30/2022   GFRNONAA >60 11/30/2022   CALCIUM 8.9 11/30/2022   PHOS 4.6 08/12/2022   PROT 7.4 11/30/2022   ALBUMIN 4.3 11/30/2022   LABGLOB 3.1 09/02/2022   AGRATIO 1.3 09/02/2022   BILITOT 0.8 11/30/2022   ALKPHOS 117 11/30/2022   AST 19 11/30/2022   ALT 9 11/30/2022   ANIONGAP 8 11/30/2022   Last lipids Lab Results  Component Value Date   TRIG 44 10/23/2020   Last hemoglobin A1c No results found for: "HGBA1C" Last thyroid functions No results found for: "TSH", "T3TOTAL", "T4TOTAL", "THYROIDAB" Last vitamin D Lab Results  Component Value Date   VD25OH 15.6 (L) 09/02/2022   Last vitamin B12 and Folate No results found for: "VITAMINB12", "FOLATE"    The ASCVD Risk score (Arnett DK, et al., 2019) failed to calculate for the following reasons:   The 2019 ASCVD risk score is only valid for ages 37 to 8    Assessment & Plan:   Problem List Items Addressed This Visit       Other   Sickle cell anemia with crisis - Primary   Relevant Medications   hydroxyurea (HYDREA) 500 MG capsule   Other Visit Diagnoses      Vitamin D deficiency       Chronic prescription opiate use       Relevant Orders   [id number] 11+Oxyco+Alc+Crt-Bund     Patient continues to have sickle cell pain crisis.  He will transition to sickle cell day infusion center for further management of his sickle cell pain crisis.  Will initiate IV Dilaudid PCA, IV fluids, IV Toradol, and Tylenol.  Upon returning home, will restart oxycodone 10 mg every 6 hours as needed, ibuprofen 800 mg every 8 hours as needed, and restart folic acid.  Also, will restart hydroxyurea at 500 mg daily.  Recommend that patient take medication at bedtime to prevent any GI upset.  Patient will return to clinic in 4 weeks to repeat labs.  Also, will continue Adakveo infusions  Return in about 3 months (around 03/03/2023) for sickle cell anemia.   [name]  APRN, MSN, FNP-C Patient Care [facility] [address] [place], [place] [address] [phone]

## 2022-12-03 LAB — DRUG SCREEN 764883 11+OXYCO+ALC+CRT-BUND

## 2022-12-04 NOTE — H&P (Signed)
History and Physical    Patient: Jonathan [name] DOB: [birth year] DOA: 12/01/2022 DOS: the patient was seen and examined on [id number] PCP: Quentin Angst, MD  Patient coming from: Home  Chief Complaint: No chief complaint on file.  HPI: Jonathan [name] is a 22 y.o. male with medical history significant of sickle cell disease, acute chest syndrome and asthma who presented to the sickle cell day hospital with complaint of pain at 7 out of 10.  This is on the right arm.  He was treated for pain crisis in the ER the day before and discharged home but he is out of his oxycodone.  Denied any other complaints.  Patient was asked to come in for treatment.  Review of Systems: As mentioned in the history of present illness. All other systems reviewed and are negative. Past Medical History:  Diagnosis Date   Acute chest syndrome(517.3) 08/26/2011   march 2013, Sept 2013   Asthma    Avascular necrosis of bone of left hip    Sickle cell disease, type SS    Past Surgical History:  Procedure Laterality Date   ADENOIDECTOMY     SPLENECTOMY     TONSILLECTOMY     TONSILLECTOMY AND ADENOIDECTOMY     TOTAL HIP ARTHROPLASTY Left 03/07/2016   UMBILICAL HERNIA REPAIR     Social History:  reports that he has never smoked. He has never used smokeless tobacco. He reports that he does not drink alcohol and does not use drugs.  Allergies  Allergen Reactions   Allergy Medication [Diphenhydramine] Swelling and Other (See Comments)    Face swells.     Family History  Problem Relation Age of Onset   Hypertension Mother    Hypertension Maternal Grandmother    Hypertension Maternal Grandfather    Asthma Paternal Grandfather    Diabetes Paternal Grandfather     Prior to Admission medications   Medication Sig Start Date End Date Taking? Authorizing Provider  folic acid (FOLVITE) 1 MG tablet Take 1 tablet (1 mg total) by mouth daily. 12/02/22 12/02/23  Massie Maroon, FNP   hydroxyurea (HYDREA) 500 MG capsule Take 1 capsule (500 mg total) by mouth daily. May take with food to minimize GI side effects. 12/02/22   Massie Maroon, FNP  ibuprofen (ADVIL) 800 MG tablet Take 1 tablet (800 mg total) by mouth every 8 (eight) hours as needed. 12/02/22   Massie Maroon, FNP    Physical Exam: Vitals:   12/01/22 1148 12/01/22 1351 12/01/22 1532  BP: 137/80 137/73 133/79  Pulse: 74 60 79  Resp: 16 16 15   Temp: 99 F (37.2 C)    TempSrc: Temporal    SpO2: 96% 98% 95%   Constitutional: NAD, calm, comfortable Eyes: PERRL, lids and conjunctivae normal ENMT: Mucous membranes are moist. Posterior pharynx clear of any exudate or lesions.Normal dentition.  Neck: normal, supple, no masses, no thyromegaly Respiratory: clear to auscultation bilaterally, no wheezing, no crackles. Normal respiratory effort. No accessory muscle use.  Cardiovascular: Regular rate and rhythm, no murmurs / rubs / gallops. No extremity edema. 2+ pedal pulses. No carotid bruits.  Abdomen: no tenderness, no masses palpated. No hepatosplenomegaly. Bowel sounds positive.  Musculoskeletal: Good range of motion, no joint swelling or tenderness, Skin: no rashes, lesions, ulcers. No induration Neurologic: CN 2-12 grossly intact. Sensation intact, DTR normal. Strength 5/5 in all 4.  Psychiatric: Normal judgment and insight. Alert and oriented x 3. Normal mood  Data Reviewed:  There are no new results to review at this time.  Assessment and Plan:  #1 sickle cell pain crisis: Patient will be admitted to the hospital.  Initiate Dilaudid PCA, Toradol, and IV D5 half-normal.  Obtain labs.  Treat and adjust medications as necessary.  #2 history of asthma: No acute exacerbation.    Advance Care Planning:   Code Status: Prior full code  Consults: None  Family Communication: No family at bedside  Severity of Illness: The appropriate patient status for this patient is OBSERVATION. Observation status is  judged to be reasonable and necessary in order to provide the required intensity of service to ensure the patient's safety. The patient's presenting symptoms, physical exam findings, and initial radiographic and laboratory data in the context of their medical condition is felt to place them at decreased risk for further clinical deterioration. Furthermore, it is anticipated that the patient will be medically stable for discharge from the hospital within 2 midnights of admission.   AuthorLonia Blood, MD 12/04/2022 4:21 PM  For on call review [email or web address].

## 2022-12-04 NOTE — Hospital Course (Signed)
Patient admitted to the day infusion hospital for sickle cell pain. Patient initially reported right arm pain rated 7/10. For pain management, patient placed on Full-Dose Dilaudid PCA, given 15 mg IV Toradol and hydrated with IV fluids. At discharge, patient rated pain at 4/10. Vital signs stable.  Patient's prescription of oxycodone was sent to his pharmacy.  Counseling provided for home use.

## 2022-12-05 ENCOUNTER — Other Ambulatory Visit: Payer: Self-pay | Admitting: Family Medicine

## 2022-12-05 DIAGNOSIS — D571 Sickle-cell disease without crisis: Secondary | ICD-10-CM

## 2022-12-05 DIAGNOSIS — Z79891 Long term (current) use of opiate analgesic: Secondary | ICD-10-CM

## 2022-12-05 MED ORDER — OXYCODONE HCL 10 MG PO TABS: 10.0000 mg | ORAL_TABLET | Freq: Four times a day (QID) | ORAL | 0 refills | Status: DC | PRN

## 2022-12-05 NOTE — Progress Notes (Signed)
Reviewed PDMP substance reporting system prior to prescribing opiate medications. No inconsistencies noted.  Meds ordered this encounter  Medications   Oxycodone HCl 10 MG TABS    Sig: Take 1 tablet (10 mg total) by mouth every 6 (six) hours as needed.    Dispense:  60 tablet    Refill:  0    Order Specific Question:   Supervising Provider    Answer:   JEGEDE, OLUGBEMIGA E [[id number]]   Jonathan Cude Moore [name]  APRN, MSN, FNP-C [facility] [address]  [place], [place] [address] [phone]  

## 2022-12-06 LAB — OXYCODONE/OXYMORPHONE, CONFIRM
OXYCODONE/OXYMORPH: POSITIVE — AB
OXYCODONE: NEGATIVE
OXYMORPHONE (GC/MS): 468 ng/mL
OXYMORPHONE: POSITIVE — AB

## 2022-12-06 LAB — DRUG SCREEN 764883 11+OXYCO+ALC+CRT-BUND
Barbiturate: NEGATIVE ng/mL
Cocaine (Metabolite): NEGATIVE ng/mL
Creatinine: 149 mg/dL (ref 20.0–300.0)
Meperidine: NEGATIVE ng/mL
Methadone Screen, Urine: NEGATIVE ng/mL
Phencyclidine: NEGATIVE ng/mL
Propoxyphene: NEGATIVE ng/mL
pH, Urine: 5.3 (ref 4.5–8.9)

## 2022-12-06 LAB — CANNABINOID CONFIRMATION, UR
CANNABINOIDS: POSITIVE — AB
Carboxy THC GC/MS Conf: 30 ng/mL

## 2023-01-02 ENCOUNTER — Telehealth: Payer: Self-pay | Admitting: Internal Medicine

## 2023-01-02 ENCOUNTER — Other Ambulatory Visit: Payer: Self-pay | Admitting: Family Medicine

## 2023-01-02 DIAGNOSIS — D571 Sickle-cell disease without crisis: Secondary | ICD-10-CM

## 2023-01-02 DIAGNOSIS — Z79891 Long term (current) use of opiate analgesic: Secondary | ICD-10-CM

## 2023-01-02 DIAGNOSIS — D5700 Hb-SS disease with crisis, unspecified: Secondary | ICD-10-CM

## 2023-01-02 MED ORDER — OXYCODONE HCL 10 MG PO TABS
10.0000 mg | ORAL_TABLET | Freq: Four times a day (QID) | ORAL | 0 refills | Status: DC | PRN
Start: 2023-01-02 — End: 2023-01-26

## 2023-01-02 MED ORDER — HYDROXYUREA 500 MG PO CAPS: 500.0000 mg | ORAL_CAPSULE | Freq: Every day | ORAL | 3 refills | Status: DC

## 2023-01-02 NOTE — Progress Notes (Signed)
Reviewed PDMP substance reporting system prior to prescribing opiate medications. No inconsistencies noted.  Meds ordered this encounter  Medications   hydroxyurea (HYDREA) 500 MG capsule    Sig: Take 1 capsule (500 mg total) by mouth daily. May take with food to minimize GI side effects.    Dispense:  90 capsule    Refill:  3    Order Specific Question:   Supervising Provider    Answer:   Quentin Angst [[id number]]   Oxycodone HCl 10 MG TABS    Sig: Take 1 tablet (10 mg total) by mouth every 6 (six) hours as needed.    Dispense:  60 tablet    Refill:  0    Order Specific Question:   Supervising Provider    Answer:   Quentin Angst [[id number]]   [name]  APRN, MSN, FNP-C Patient Care [facility] [address] [place], [place] [address] [phone]

## 2023-01-02 NOTE — Telephone Encounter (Signed)
Caller & Relationship to patient:  MRN #  [id number]   Call Back Number:   Date of Last Office Visit: 12/02/2022     Date of Next Office Visit: 03/03/2023    Medication(s) to be Refilled: hydroxurea and oxycodone   Preferred Pharmacy:   ** Please notify patient to allow 48-72 hours to process** **Let patient know to contact pharmacy at the end of the day to make sure medication is ready. ** **If patient has not been seen in a year or longer, book an appointment **Advise to use MyChart for refill requests OR to contact their pharmacy

## 2023-01-26 ENCOUNTER — Non-Acute Institutional Stay (HOSPITAL_COMMUNITY)
Admission: AD | Admit: 2023-01-26 | Discharge: 2023-01-26 | Disposition: A | Discharge status: Home / Self Care | Payer: Commercial Managed Care - PPO | Source: Ambulatory Visit | Attending: Internal Medicine | Admitting: Internal Medicine

## 2023-01-26 ENCOUNTER — Telehealth (HOSPITAL_COMMUNITY): Payer: Self-pay

## 2023-01-26 DIAGNOSIS — D638 Anemia in other chronic diseases classified elsewhere: Secondary | ICD-10-CM | POA: Insufficient documentation

## 2023-01-26 DIAGNOSIS — G894 Chronic pain syndrome: Secondary | ICD-10-CM | POA: Insufficient documentation

## 2023-01-26 DIAGNOSIS — D5700 Hb-SS disease with crisis, unspecified: Secondary | ICD-10-CM | POA: Diagnosis present

## 2023-01-26 DIAGNOSIS — Z79891 Long term (current) use of opiate analgesic: Secondary | ICD-10-CM | POA: Insufficient documentation

## 2023-01-26 DIAGNOSIS — D571 Sickle-cell disease without crisis: Secondary | ICD-10-CM

## 2023-01-26 LAB — COMPREHENSIVE METABOLIC PANEL
ALT: 9 U/L (ref 0–44)
AST: 14 U/L — ABNORMAL LOW (ref 15–41)
Albumin: 3.9 g/dL (ref 3.5–5.0)
Alkaline Phosphatase: 101 U/L (ref 38–126)
Anion gap: 7 (ref 5–15)
BUN: 9 mg/dL (ref 6–20)
CO2: 25 mmol/L (ref 22–32)
Calcium: 9 mg/dL (ref 8.9–10.3)
Chloride: 108 mmol/L (ref 98–111)
Creatinine, Ser: 0.74 mg/dL (ref 0.61–1.24)
GFR, Estimated: >60 mL/min (ref >60)
Glucose, Bld: 109 mg/dL — ABNORMAL HIGH (ref 70–99)
Potassium: 3.8 mmol/L (ref 3.5–5.1)
Sodium: 140 mmol/L (ref 135–145)
Total Bilirubin: 1.3 mg/dL — ABNORMAL HIGH (ref 0.3–1.2)
Total Protein: 6.9 g/dL (ref 6.5–8.1)

## 2023-01-26 LAB — CBC WITH DIFFERENTIAL/PLATELET
Abs Immature Granulocytes: 0.01 10*3/uL (ref 0.00–0.07)
Basophils Absolute: 0.1 10*3/uL (ref 0.0–0.1)
Basophils Relative: 1 %
Eosinophils Absolute: 0.3 10*3/uL (ref 0.0–0.5)
Eosinophils Relative: 3 %
HCT: 30.8 % — ABNORMAL LOW (ref 39.0–52.0)
Hemoglobin: 10.6 g/dL — ABNORMAL LOW (ref 13.0–17.0)
Immature Granulocytes: 0 %
Lymphocytes Relative: 35 %
Lymphs Abs: 3.3 10*3/uL (ref 0.7–4.0)
MCH: 32.2 pg (ref 26.0–34.0)
MCHC: 34.4 g/dL (ref 30.0–36.0)
MCV: 93.6 fL (ref 80.0–100.0)
Monocytes Absolute: 0.8 10*3/uL (ref 0.1–1.0)
Monocytes Relative: 9 %
Neutro Abs: 4.8 10*3/uL (ref 1.7–7.7)
Neutrophils Relative %: 52 %
Platelets: 445 10*3/uL — ABNORMAL HIGH (ref 150–400)
RBC: 3.29 MIL/uL — ABNORMAL LOW (ref 4.22–5.81)
RDW: 19 % — ABNORMAL HIGH (ref 11.5–15.5)
WBC: 9.2 10*3/uL (ref 4.0–10.5)
nRBC: 1 % — ABNORMAL HIGH (ref 0.0–0.2)

## 2023-01-26 LAB — RETICULOCYTES
Immature Retic Fract: 36.7 % — ABNORMAL HIGH (ref 2.3–15.9)
RBC.: 3.33 MIL/uL — ABNORMAL LOW (ref 4.22–5.81)
Retic Count, Absolute: 40 10*3/uL (ref 19.0–186.0)
Retic Ct Pct: 5.8 % — ABNORMAL HIGH (ref 0.4–3.1)

## 2023-01-26 MED ORDER — SODIUM CHLORIDE 0.9% FLUSH: 9.0000 mL | INTRAVENOUS | Status: DC | PRN

## 2023-01-26 MED ORDER — HYDROMORPHONE 1 MG/ML IV SOLN
INTRAVENOUS | Status: DC
  Administered 2023-01-26: 30 mg via INTRAVENOUS
  Administered 2023-01-26: 4.5 mg via INTRAVENOUS
  Filled 2023-01-26: qty 30

## 2023-01-26 MED ORDER — NALOXONE HCL 0.4 MG/ML IJ SOLN: 0.4000 mg | INTRAMUSCULAR | Status: DC | PRN

## 2023-01-26 MED ORDER — HYDROXYUREA 500 MG PO CAPS
500.0000 mg | ORAL_CAPSULE | Freq: Every day | ORAL | 3 refills | Status: DC
Start: 2023-01-26 — End: 2023-02-27

## 2023-01-26 MED ORDER — ACETAMINOPHEN 500 MG PO TABS
1000.0000 mg | ORAL_TABLET | Freq: Once | ORAL | Status: AC
  Administered 2023-01-26: 1000 mg via ORAL
  Filled 2023-01-26: qty 2

## 2023-01-26 MED ORDER — ONDANSETRON HCL 4 MG/2ML IJ SOLN: 4.0000 mg | Freq: Four times a day (QID) | INTRAMUSCULAR | Status: DC | PRN

## 2023-01-26 MED ORDER — IBUPROFEN 800 MG PO TABS
800.00 mg | ORAL_TABLET | Freq: Three times a day (TID) | ORAL | 4 refills | Status: DC | PRN
Start: 2023-01-26 — End: 2023-02-27

## 2023-01-26 MED ORDER — OXYCODONE HCL 10 MG PO TABS
10.0000 mg | ORAL_TABLET | Freq: Four times a day (QID) | ORAL | 0 refills | Status: DC | PRN
Start: 2023-01-26 — End: 2023-02-06

## 2023-01-26 MED ORDER — SODIUM CHLORIDE 0.45 % IV SOLN: INTRAVENOUS | Status: DC

## 2023-01-26 MED ORDER — KETOROLAC TROMETHAMINE 30 MG/ML IJ SOLN
15.0000 mg | Freq: Once | INTRAMUSCULAR | Status: AC
  Administered 2023-01-26: 15 mg via INTRAVENOUS
  Filled 2023-01-26: qty 1

## 2023-01-26 NOTE — Telephone Encounter (Signed)
Pt called day hospital wanting to come in today for sickle cell pain treatment. Pt reports 8/10 pain to left arm. Pt states he took Oxycodone 10 mg at 10 PM last night. Pt states that he still has home medications. Pt denies being to ER. Pt screened for COVID and denies symptoms and exposures. Pt denies fever, chest pain, nausea, vomiting, abdominal pain, and priapism. Armenia Hollis, FNP notified and she said patient can come to day hospital today. Pt notified and verbalized understanding. Pt has transportation without driving himself.

## 2023-01-26 NOTE — Discharge Summary (Signed)
[facility] Dis[facility] Summary   Patient ID: Jonathan [name] MRN: [id number] DOB/AGE: September 22, 2000 22 y.o.  Admit date: 01/26/2023 Discharge date: 01/26/2023  Primary Care Physician:  Quentin Angst, MD  Admission Diagnoses:  Principal Problem:   Sickle cell pain crisis [facility])   Discharge Medications:  Allergies as of 01/26/2023       Reactions   Allergy Medication [diphenhydramine] Swelling, Other (See Comments)   Face swells.         Medication List     TAKE these medications    folic acid 1 MG tablet Commonly known as: FOLVITE Take 1 tablet (1 mg total) by mouth daily.   hydroxyurea 500 MG capsule Commonly known as: HYDREA Take 1 capsule (500 mg total) by mouth daily. May take with food to minimize GI side effects.   ibuprofen 800 MG tablet Commonly known as: ADVIL Take 1 tablet (800 mg total) by mouth every 8 (eight) hours as needed.   Oxycodone HCl 10 MG Tabs Take 1 tablet (10 mg total) by mouth every 6 (six) hours as needed.         Consults:  None  Significant Diagnostic Studies:  No results found.   History of Present Illness: Jonathan [name] is a 22 year old male with a medical history significant for sickle cell disease, chronic pain syndrome, opiate dependence and tolerance, and anemia  of chronic disease that presents with complaints of left arm pain. Patient says that he has been experiencing left arm pain over the past several days that is consistent with previous sickle cell crisis. He attributes pain crisis to going to the pool recently. Patient rates pain intensity at 9/10, constant and throbbing. He denies any headache, fever, chills, chest pain, or shortness of breath. No urinary symptoms, nausea, vomiting, or diarrhea. No sick contacts or recent travel.   [facility] Course: Patient admitted to sickle cell day infusion clinic for management of pain crisis. Reviewed his labs, largely consistent with his  baseline. Pain managed with IV Dilaudid PCA, IV fluids, IV Toradol, and Tylenol.  Pain intensity decreased throughout admission and patient is requesting discharge home.  He states that he does not have pain medications at home.  Oxycodone 10 mg #60 every 6 hours as needed for breakthrough pain, ibuprofen 800 every 8 hours as needed with food for mild to moderate pain, and hydroxyurea were sent to patient's pharmacy.  PDMP was reviewed prior to prescribing opiates and no inconsistencies were noted. Patient alert, oriented, and ambulating without assistance.  He will discharge home in a hemodynamically stable condition.  Discharge instructions: Resume all home medications.   Follow up with PCP as previously  scheduled.   Discussed the importance of drinking 64 ounces of water daily, dehydration of red blood cells may lead further sickling.   Avoid all stressors that precipitate sickle cell pain crisis.     The patient was given clear instructions to go to ER or return to medical center if symptoms do not improve, worsen or new problems develop.    Physical Exam at Discharge:  BP 120/77 (BP Location: Right Arm)   Pulse 85   Temp 98.3 F (36.8 C) (Temporal)   Resp 16   SpO2 97%   Physical Exam HENT:     Mouth/Throat:     Pharynx: Oropharynx is clear.  Cardiovascular:     Rate and Rhythm: Normal rate and regular rhythm.  Abdominal:     General: Bowel sounds  are normal.  Musculoskeletal:        General: Normal range of motion.  Skin:    General: Skin is warm.  Neurological:     General: No focal deficit present.     Mental Status: Mental status is at baseline.  Psychiatric:        Mood and Affect: Mood normal.        Behavior: Behavior normal.        Thought Content: Thought content normal.        Judgment: Judgment normal.      Disposition at Discharge: Discharge disposition: 01-Home or Self Care       Discharge Orders: Discharge Instructions     Discharge  patient   Complete by: As directed    Discharge disposition: 01-Home or Self Care   Discharge patient date: 01/26/2023       Condition at Discharge:   Stable  Time spent on Discharge:  Greater than 30 minutes.  Signed: [name]  APRN, MSN, FNP-C Patient Care [facility] [address] [place], [place] [address] [phone]  01/26/2023, 2:03 PM

## 2023-01-26 NOTE — Progress Notes (Addendum)
Patient admitted to the day infusion hospital for sickle cell pain. Initially, patient reported left arm pain rated 7/10. For pain management, patient placed on Dilaudid PCA, given 15 mg Toradol IV, 1000 mg Tylenol and patient hydrated with IV fluids. At discharge, patient rated pain at 3/10. Vital signs stable. AVS offered but patient refused. Patient alert, oriented and ambulatory at discharge.

## 2023-01-26 NOTE — H&P (Addendum)
[facility] History and Physical   Date: 01/26/2023  Patient name: Jonathan [name] Medical record number: [id number] Date of birth: [birth year] Age: 22 y.o. Gender: male PCP: Quentin Angst, MD  Attending physician: Quentin Angst, MD  Chief Complaint: Sickle cell pain   History of Present Illness: Jonathan [name] is a 22 year old male with a medical history significant for sickle cell disease, chronic pain syndrome, opiate dependence and tolerance, and anemia  of chronic disease that presents with complaints of left arm pain. Patient says that he has been experiencing left arm pain over the past several days that is consistent with previous sickle cell crisis. He attributes pain crisis to going to the pool recently. Patient rates pain intensity at 9/10, constant and throbbing. He denies any headache, fever, chills, chest pain, or shortness of breath. No urinary symptoms, nausea, vomiting, or diarrhea. No sick contacts or recent travel.   Meds: Medications Prior to Admission  Medication Sig Dispense Refill Last Dose   folic acid (FOLVITE) 1 MG tablet Take 1 tablet (1 mg total) by mouth daily. 90 tablet 3    hydroxyurea (HYDREA) 500 MG capsule Take 1 capsule (500 mg total) by mouth daily. May take with food to minimize GI side effects. 90 capsule 3    ibuprofen (ADVIL) 800 MG tablet Take 1 tablet (800 mg total) by mouth every 8 (eight) hours as needed. 30 tablet 1    Oxycodone HCl 10 MG TABS Take 1 tablet (10 mg total) by mouth every 6 (six) hours as needed. 60 tablet 0     Allergies: Allergy medication [diphenhydramine] Past Medical History:  Diagnosis Date   Acute chest syndrome(517.3) 08/26/2011   march 2013, Sept 2013   Asthma    Avascular necrosis of bone of left hip (HCC)    Sickle cell disease, type SS (HCC)    Past Surgical History:  Procedure Laterality Date   ADENOIDECTOMY     SPLENECTOMY     TONSILLECTOMY     TONSILLECTOMY AND ADENOIDECTOMY      TOTAL HIP ARTHROPLASTY Left 03/07/2016   UMBILICAL HERNIA REPAIR     Family History  Problem Relation Age of Onset   Hypertension Mother    Hypertension Maternal Grandmother    Hypertension Maternal Grandfather    Asthma Paternal Grandfather    Diabetes Paternal Grandfather    Social History   Socioeconomic History   Marital status: Single    Spouse name: Not on file   Number of children: Not on file   Years of education: Not on file   Highest education level: Not on file  Occupational History   Not on file  Tobacco Use   Smoking status: Never   Smokeless tobacco: Never  Vaping Use   Vaping Use: Never used  Substance and Sexual Activity   Alcohol use: No    Alcohol/week: 0.0 standard drinks of alcohol   Drug use: No   Sexual activity: Never  Other Topics Concern   Not on file  Social History Narrative   Lives with mother.  No smokers at home      Update:       05/02/16 Lives with Mother only   Social Determinants of Health   Financial Resource Strain: Not on file  Food Insecurity: No Food Insecurity (08/12/2022)   Hunger Vital Sign    Worried About Running Out of Food in the Last Year: Never true    Ran Out of Food in  the Last Year: Never true  Transportation Needs: Unmet Transportation Needs (08/12/2022)   PRAPARE - Administrator, Civil Service (Medical): Yes    Lack of Transportation (Non-Medical): Yes  Physical Activity: Not on file  Stress: Not on file  Social Connections: Not on file  Intimate Partner Violence: Not At Risk (08/12/2022)   Humiliation, Afraid, Rape, and Kick questionnaire    Fear of Current or Ex-Partner: No    Emotionally Abused: No    Physically Abused: No    Sexually Abused: No   Review of Systems  Constitutional: Negative.   HENT: Negative.    Eyes: Negative.   Respiratory: Negative.    Cardiovascular: Negative.   Gastrointestinal: Negative.   Genitourinary: Negative.   Musculoskeletal:  Positive for back pain  and joint pain.  Skin: Negative.   Neurological: Negative.   Endo/Heme/Allergies: Negative.   Psychiatric/Behavioral: Negative.       Physical Exam: Blood pressure (!) 136/97, pulse 69, temperature 98.3 F (36.8 C), temperature source Temporal, resp. rate 16, SpO2 97 %. Physical Exam Constitutional:      Appearance: Normal appearance.  Eyes:     Pupils: Pupils are equal, round, and reactive to light.  Cardiovascular:     Rate and Rhythm: Normal rate and regular rhythm.  Pulmonary:     Effort: Pulmonary effort is normal.  Abdominal:     General: Bowel sounds are normal.  Musculoskeletal:        General: Normal range of motion.  Skin:    General: Skin is warm.  Neurological:     General: No focal deficit present.     Mental Status: He is alert. Mental status is at baseline.  Psychiatric:        Mood and Affect: Mood normal.        Behavior: Behavior normal.        Thought Content: Thought content normal.        Judgment: Judgment normal.      Lab results: No results found for this or any previous visit (from the past 24 hour(s)).  Imaging results:  No results found.   Assessment & Plan: Patient admitted to sickle cell day infusion center for management of pain crisis.  Patient is opiate tolerant Initiate IV dilaudid PCA.  IV fluids, 0.45% saline at 100 ml/hr Toradol 15 mg IV times one dose Tylenol 1000 mg by mouth times one dose Review CBC with differential, complete metabolic panel, and reticulocytes as results become available. Pain intensity will be reevaluated in context of functioning and relationship to baseline as care progresses If pain intensity remains elevated and/or sudden change in hemodynamic stability transition to inpatient services for higher level of care.      [name]  APRN, MSN, FNP-C Patient Care [facility] [address] [place], [place] [address] [phone]  01/26/2023, 9:40 AM   Evaluation  and management procedures were performed by the Advanced Practitioner under my supervision and collaboration. I have reviewed the Advanced Practitioner's note and chart, and I agree with the management and plan.   Jeanann Lewandowsky, MD, MHA, CPE, Sherle Poe [facility] Patient [facility] [place], [place] [phone] 02/19/2023, 4:55 PM

## 2023-02-06 ENCOUNTER — Telehealth (HOSPITAL_COMMUNITY): Payer: Self-pay

## 2023-02-06 ENCOUNTER — Telehealth: Payer: Self-pay | Admitting: Internal Medicine

## 2023-02-06 ENCOUNTER — Non-Acute Institutional Stay (HOSPITAL_COMMUNITY)
Admission: AD | Admit: 2023-02-06 | Discharge: 2023-02-06 | Disposition: A | Discharge status: Home / Self Care | Payer: Commercial Managed Care - PPO | Source: Ambulatory Visit | Attending: Internal Medicine | Admitting: Internal Medicine

## 2023-02-06 DIAGNOSIS — J4520 Mild intermittent asthma, uncomplicated: Secondary | ICD-10-CM | POA: Diagnosis not present

## 2023-02-06 DIAGNOSIS — D5700 Hb-SS disease with crisis, unspecified: Secondary | ICD-10-CM | POA: Insufficient documentation

## 2023-02-06 DIAGNOSIS — Z79899 Other long term (current) drug therapy: Secondary | ICD-10-CM | POA: Diagnosis not present

## 2023-02-06 DIAGNOSIS — D638 Anemia in other chronic diseases classified elsewhere: Secondary | ICD-10-CM | POA: Insufficient documentation

## 2023-02-06 DIAGNOSIS — Z79891 Long term (current) use of opiate analgesic: Secondary | ICD-10-CM

## 2023-02-06 DIAGNOSIS — D571 Sickle-cell disease without crisis: Secondary | ICD-10-CM

## 2023-02-06 LAB — COMPREHENSIVE METABOLIC PANEL
ALT: 13 U/L (ref 0–44)
AST: 34 U/L (ref 15–41)
Albumin: 4 g/dL (ref 3.5–5.0)
Alkaline Phosphatase: 102 U/L (ref 38–126)
Anion gap: 11 (ref 5–15)
BUN: 7 mg/dL (ref 6–20)
CO2: 22 mmol/L (ref 22–32)
Calcium: 8.6 mg/dL — ABNORMAL LOW (ref 8.9–10.3)
Chloride: 109 mmol/L (ref 98–111)
Creatinine, Ser: 0.97 mg/dL (ref 0.61–1.24)
GFR, Estimated: >60 mL/min (ref >60)
Glucose, Bld: 116 mg/dL — ABNORMAL HIGH (ref 70–99)
Potassium: 3.4 mmol/L — ABNORMAL LOW (ref 3.5–5.1)
Sodium: 142 mmol/L (ref 135–145)
Total Bilirubin: 0.8 mg/dL (ref 0.3–1.2)
Total Protein: 7.6 g/dL (ref 6.5–8.1)

## 2023-02-06 LAB — CBC WITH DIFFERENTIAL/PLATELET
Abs Immature Granulocytes: 0.03 10*3/uL (ref 0.00–0.07)
Basophils Absolute: 0.1 10*3/uL (ref 0.0–0.1)
Basophils Relative: 1 %
Eosinophils Absolute: 0.2 10*3/uL (ref 0.0–0.5)
Eosinophils Relative: 2 %
HCT: 27.9 % — ABNORMAL LOW (ref 39.0–52.0)
Hemoglobin: 9.4 g/dL — ABNORMAL LOW (ref 13.0–17.0)
Immature Granulocytes: 0 %
Lymphocytes Relative: 30 %
Lymphs Abs: 2.6 10*3/uL (ref 0.7–4.0)
MCH: 31.2 pg (ref 26.0–34.0)
MCHC: 33.7 g/dL (ref 30.0–36.0)
MCV: 92.7 fL (ref 80.0–100.0)
Monocytes Absolute: 0.9 10*3/uL (ref 0.1–1.0)
Monocytes Relative: 11 %
Neutro Abs: 4.9 10*3/uL (ref 1.7–7.7)
Neutrophils Relative %: 56 %
Platelets: 400 10*3/uL (ref 150–400)
RBC: 3.01 MIL/uL — ABNORMAL LOW (ref 4.22–5.81)
RDW: 18.6 % — ABNORMAL HIGH (ref 11.5–15.5)
WBC: 8.7 10*3/uL (ref 4.0–10.5)
nRBC: 1.5 % — ABNORMAL HIGH (ref 0.0–0.2)

## 2023-02-06 LAB — RETICULOCYTES
Immature Retic Fract: 42.9 % — ABNORMAL HIGH (ref 2.3–15.9)
RBC.: 3.01 MIL/uL — ABNORMAL LOW (ref 4.22–5.81)
Retic Count, Absolute: 234.8 10*3/uL — ABNORMAL HIGH (ref 19.0–186.0)
Retic Ct Pct: 7.8 % — ABNORMAL HIGH (ref 0.4–3.1)

## 2023-02-06 MED ORDER — ACETAMINOPHEN 500 MG PO TABS
1000.0000 mg | ORAL_TABLET | Freq: Once | ORAL | Status: AC
  Administered 2023-02-06: 1000 mg via ORAL
  Filled 2023-02-06: qty 2

## 2023-02-06 MED ORDER — NALOXONE HCL 0.4 MG/ML IJ SOLN: 0.4000 mg | INTRAMUSCULAR | Status: DC | PRN

## 2023-02-06 MED ORDER — KETOROLAC TROMETHAMINE 30 MG/ML IJ SOLN
15.0000 mg | Freq: Once | INTRAMUSCULAR | Status: AC
  Administered 2023-02-06: 15 mg via INTRAVENOUS
  Filled 2023-02-06: qty 1

## 2023-02-06 MED ORDER — HYDROMORPHONE 1 MG/ML IV SOLN
INTRAVENOUS | Status: DC
  Administered 2023-02-06: 10 mg via INTRAVENOUS
  Administered 2023-02-06: 30 mg via INTRAVENOUS
  Filled 2023-02-06: qty 30

## 2023-02-06 MED ORDER — ACETAMINOPHEN 325 MG PO TABS: 650.0000 mg | ORAL_TABLET | Freq: Once | ORAL | Status: DC

## 2023-02-06 MED ORDER — SODIUM CHLORIDE 0.9% FLUSH: 9.0000 mL | INTRAVENOUS | Status: DC | PRN

## 2023-02-06 MED ORDER — OXYCODONE HCL 10 MG PO TABS
10.0000 mg | ORAL_TABLET | Freq: Four times a day (QID) | ORAL | 0 refills | Status: DC | PRN
Start: 2023-02-10 — End: 2023-02-27

## 2023-02-06 MED ORDER — SODIUM CHLORIDE 0.45 % IV SOLN: INTRAVENOUS | Status: DC

## 2023-02-06 MED ORDER — ONDANSETRON HCL 4 MG/2ML IJ SOLN
4.0000 mg | Freq: Four times a day (QID) | INTRAMUSCULAR | Status: DC | PRN
  Administered 2023-02-06: 4 mg via INTRAVENOUS
  Filled 2023-02-06: qty 2

## 2023-02-06 NOTE — H&P (Signed)
[facility] History and Physical   Date: 02/06/2023  Patient name: Jonathan [name] Medical record number: [id number] Date of birth: Mar 27, 2001 Age: 22 y.o. Gender: male PCP: Quentin Angst, MD  Attending physician: Quentin Angst, MD  Chief Complaint: Sickle cell pain   History of Present Illness: Jonathan [name] is a 22 year old male with a medical history significant for sickle cell disease, anemia of chronic disease, and mild intermittent asthma that presents with complaints of right lower extremity pain that is consistent with his sickle cell pain crisis.  Patient states that pain intensity increased several days ago and has been made by his home medications.  Patient last had oxycodone and ibuprofen this a.m. without sustained relief.  He rates pain as 8/10 constant, and aching.  No fever, chills, chest pain, or shortness of breath.  No urinary symptoms, nausea, vomiting, or diarrhea.  No recent travel or sick contacts.  Meds: Medications Prior to Admission  Medication Sig Dispense Refill Last Dose   folic acid (FOLVITE) 1 MG tablet Take 1 tablet (1 mg total) by mouth daily. 90 tablet 3    hydroxyurea (HYDREA) 500 MG capsule Take 1 capsule (500 mg total) by mouth daily. May take with food to minimize GI side effects. 90 capsule 3    ibuprofen (ADVIL) 800 MG tablet Take 1 tablet (800 mg total) by mouth every 8 (eight) hours as needed. 30 tablet 4    Oxycodone HCl 10 MG TABS Take 1 tablet (10 mg total) by mouth every 6 (six) hours as needed. 60 tablet 0     Allergies: Allergy medication [diphenhydramine] Past Medical History:  Diagnosis Date   Acute chest syndrome(517.3) 08/26/2011   march 2013, Sept 2013   Asthma    Avascular necrosis of bone of left hip (HCC)    Sickle cell disease, type SS (HCC)    Past Surgical History:  Procedure Laterality Date   ADENOIDECTOMY     SPLENECTOMY     TONSILLECTOMY     TONSILLECTOMY AND ADENOIDECTOMY     TOTAL HIP  ARTHROPLASTY Left 03/07/2016   UMBILICAL HERNIA REPAIR     Family History  Problem Relation Age of Onset   Hypertension Mother    Hypertension Maternal Grandmother    Hypertension Maternal Grandfather    Asthma Paternal Grandfather    Diabetes Paternal Grandfather    Social History   Socioeconomic History   Marital status: Single    Spouse name: Not on file   Number of children: Not on file   Years of education: Not on file   Highest education level: Not on file  Occupational History   Not on file  Tobacco Use   Smoking status: Never   Smokeless tobacco: Never  Vaping Use   Vaping Use: Never used  Substance and Sexual Activity   Alcohol use: No    Alcohol/week: 0.0 standard drinks of alcohol   Drug use: No   Sexual activity: Never  Other Topics Concern   Not on file  Social History Narrative   Lives with mother.  No smokers at home      Update:       05/02/16 Lives with Mother only   Social Determinants of Health   Financial Resource Strain: Not on file  Food Insecurity: No Food Insecurity (08/12/2022)   Hunger Vital Sign    Worried About Running Out of Food in the Last Year: Never true    Ran Out of Food  in the Last Year: Never true  Transportation Needs: Unmet Transportation Needs (08/12/2022)   PRAPARE - Administrator, Civil Service (Medical): Yes    Lack of Transportation (Non-Medical): Yes  Physical Activity: Not on file  Stress: Not on file  Social Connections: Not on file  Intimate Partner Violence: Not At Risk (08/12/2022)   Humiliation, Afraid, Rape, and Kick questionnaire    Fear of Current or Ex-Partner: No    Emotionally Abused: No    Physically Abused: No    Sexually Abused: No   Review of Systems  Constitutional: Negative.   HENT: Negative.    Eyes: Negative.   Respiratory: Negative.    Cardiovascular: Negative.   Gastrointestinal: Negative.   Genitourinary: Negative.   Musculoskeletal:  Positive for back pain.  Skin:  Negative.   Neurological: Negative.   Psychiatric/Behavioral: Negative.      Physical Exam: There were no vitals taken for this visit. Physical Exam Constitutional:      Appearance: Normal appearance.  Eyes:     Pupils: Pupils are equal, round, and reactive to light.  Cardiovascular:     Rate and Rhythm: Normal rate and regular rhythm.  Abdominal:     General: Bowel sounds are normal.  Skin:    General: Skin is warm.  Neurological:     General: No focal deficit present.     Mental Status: He is alert. Mental status is at baseline.  Psychiatric:        Mood and Affect: Mood normal.        Behavior: Behavior normal.        Thought Content: Thought content normal.        Judgment: Judgment normal.      Lab results: No results found for this or any previous visit (from the past 24 hour(s)).  Imaging results:  No results found.   Assessment & Plan: Patient admitted to sickle cell day infusion center for management of pain crisis.  Patient is opiate tolerant Initiate IV dilaudid PCA.  IV fluids, 0.45% saline at 100 ml/hr Toradol 15 mg IV times one dose Tylenol 1000 mg by mouth times one dose Review CBC with differential, complete metabolic panel, and reticulocytes as results become available.  Pain intensity will be reevaluated in context of functioning and relationship to baseline as care progresses If pain intensity remains elevated and/or sudden change in hemodynamic stability transition to inpatient services for higher level of care.      [name]  APRN, MSN, FNP-C Patient Care [facility] [address] [place], [place] [address] [phone]  02/06/2023, 9:16 AM

## 2023-02-06 NOTE — Discharge Summary (Incomplete)
[facility] Discharge Summary   Patient ID: [name] MRN: [id number] DOB/AGE: Sep 16, 2000 22 y.o.  Admit date: 02/06/2023 Discharge date: 02/06/2023  Primary Care Physician:  Jonathan Angst, MD  Admission Diagnoses:  Active Problems:   Sickle cell pain crisis [facility])   Discharge Medications:  Allergies as of 02/06/2023       Reactions   Allergy Medication [diphenhydramine] Swelling, Other (See Comments)   Face swells.         Medication List     TAKE these medications    folic acid 1 MG tablet Commonly known as: FOLVITE Take 1 tablet (1 mg total) by mouth daily.   hydroxyurea 500 MG capsule Commonly known as: HYDREA Take 1 capsule (500 mg total) by mouth daily. May take with food to minimize GI side effects.   ibuprofen 800 MG tablet Commonly known as: ADVIL Take 1 tablet (800 mg total) by mouth every 8 (eight) hours as needed.   Oxycodone HCl 10 MG Tabs Take 1 tablet (10 mg total) by mouth every 6 (six) hours as needed. Start taking on: February 10, 2023 What changed: These instructions start on February 10, 2023. If you are unsure what to do until then, ask your doctor or other care provider.         Consults:  None  Significant Diagnostic Studies:  No results found.  History of Present Illness: Jonathan [name] is a 22 year old male with a medical history significant for sickle cell disease, anemia of chronic disease, and mild intermittent asthma that presents with complaints of right lower extremity pain that is consistent with his sickle cell pain crisis.  Patient states that pain intensity increased several days ago and has been made by his home medications.  Patient last had oxycodone and ibuprofen this a.m. without sustained relief.  He rates pain as 8/10 constant, and aching.  No fever, chills, chest pain, or shortness of breath.  No urinary symptoms, nausea, vomiting, or diarrhea.  No recent travel or sick contacts.  [facility] Course: Admitted to sickle cell day infusion clinic for management of pain crisis. Patient admitted for sickle cell pain crisis. Reviewed labs, consistent with baseline.  Pain managed with IV Dilaudid PCA, IV Toradol, Tylenol, and IV fluids. Pain intensity decreased throughout admission and patient is requesting discharge home. He is alert, oriented, and ambulating without assistance.  Patient will discharge home in a hemodynamically stable condition. Patient is out of his home pain medications.  Oxycodone 10 mg every 6 hours #60 was sent to patient's pharmacy.  PDMP reviewed prior to prescribing opiate medications, no inconsistencies were noted.  Discharge instructions:  Resume all home medications.   Follow up with PCP as previously  scheduled.   Discussed the importance of drinking 64 ounces of water daily, dehydration of red blood cells may lead further sickling.   Avoid all stressors that precipitate sickle cell pain crisis.     The patient was given clear instructions to go to ER or return to medical center if symptoms do not improve, worsen or new problems develop.   Physical Exam at Discharge:  BP (!) 129/93 (BP Location: Left Arm)   Pulse 72   Temp 97.8 F (36.6 C) (Temporal)   Resp 12   SpO2 96%  Physical Exam Constitutional:      Appearance: Normal appearance.  Eyes:     Pupils: Pupils are equal, round, and reactive to light.  Cardiovascular:  Rate and Rhythm: Normal rate and regular rhythm.  Pulmonary:     Effort: Pulmonary effort is normal.  Abdominal:     General: Bowel sounds are normal.  Skin:    General: Skin is warm.  Neurological:     General: No focal deficit present.     Mental Status: He is alert. Mental status is at baseline.  Psychiatric:        Mood and Affect: Mood normal.        Behavior: Behavior normal.        Thought Content: Thought content normal.        Judgment: Judgment normal.     Disposition at  Discharge: Discharge disposition: 01-Home or Self Care       Discharge Orders: Discharge Instructions     Discharge patient   Complete by: As directed    Discharge disposition: 01-Home or Self Care   Discharge patient date: 02/06/2023       Condition at Discharge:   Stable  Time spent on Discharge:  Greater than 30 minutes.  Signed: [name]  APRN, MSN, FNP-C Patient Care [facility] [address] [place], [place] [address] [phone]  02/06/2023, 2:49 PM

## 2023-02-06 NOTE — Progress Notes (Signed)
Pt admitted to the day hospital by Armenia Hollis FNP  for treatment of sickle cell pain crisis. Pt reported pain to L arm rated a 6/10. Pt given PO Tylenol, IV toradol and zofran, placed on Dilaudid PCA 0.5/10/3, and hydrated with 1/2 NS IV fluids. At discharge patient reported pain a 2/10. Provider notified of elevated DBP during infusion, pt instructed to schedule a PCP visit to address BP, no further intervention needed at this time, pt verbalized understanding and states he will call to schedule this visit.  Pt declined AVS. Pt alert , oriented and ambulatory at discharge.

## 2023-02-06 NOTE — Telephone Encounter (Signed)
Patient called in. Complains of pain in L arm rates 6/10. Denied chest pain, abd pain, fever, N/V/D, or priapism. Wants to come in for treatment. Last took Oxycodone at 4am,and Ibuprofen at 8:00am. Pt denies recent visits to the ED. Pt denies covid exposure or flu like symptoms. Pt states his [employer]i Mc is his transportation to and from center today. Armenia Hollis FNP notified, approved pt to be seen in the day hospital. Pt made aware, verbalized understanding.

## 2023-02-06 NOTE — Telephone Encounter (Signed)
Oxycodone refill request.

## 2023-02-09 ENCOUNTER — Emergency Department (HOSPITAL_COMMUNITY)
Admission: EM | Admit: 2023-02-09 | Discharge: 2023-02-09 | Disposition: A | Discharge status: Home / Self Care | Payer: Commercial Managed Care - PPO | Attending: Emergency Medicine | Admitting: Emergency Medicine

## 2023-02-09 ENCOUNTER — Emergency Department (HOSPITAL_COMMUNITY): Payer: Commercial Managed Care - PPO

## 2023-02-09 ENCOUNTER — Encounter (HOSPITAL_COMMUNITY): Payer: Self-pay

## 2023-02-09 ENCOUNTER — Other Ambulatory Visit: Payer: Self-pay

## 2023-02-09 DIAGNOSIS — I10 Essential (primary) hypertension: Secondary | ICD-10-CM | POA: Insufficient documentation

## 2023-02-09 DIAGNOSIS — J45909 Unspecified asthma, uncomplicated: Secondary | ICD-10-CM | POA: Diagnosis not present

## 2023-02-09 DIAGNOSIS — D72829 Elevated white blood cell count, unspecified: Secondary | ICD-10-CM | POA: Insufficient documentation

## 2023-02-09 DIAGNOSIS — D5700 Hb-SS disease with crisis, unspecified: Secondary | ICD-10-CM | POA: Insufficient documentation

## 2023-02-09 LAB — COMPREHENSIVE METABOLIC PANEL
ALT: 12 U/L (ref 0–44)
AST: 22 U/L (ref 15–41)
Albumin: 3.7 g/dL (ref 3.5–5.0)
Alkaline Phosphatase: 97 U/L (ref 38–126)
Anion gap: 9 (ref 5–15)
BUN: 6 mg/dL (ref 6–20)
CO2: 23 mmol/L (ref 22–32)
Calcium: 8.8 mg/dL — ABNORMAL LOW (ref 8.9–10.3)
Chloride: 109 mmol/L (ref 98–111)
Creatinine, Ser: 0.66 mg/dL (ref 0.61–1.24)
GFR, Estimated: >60 mL/min (ref >60)
Glucose, Bld: 105 mg/dL — ABNORMAL HIGH (ref 70–99)
Potassium: 3.8 mmol/L (ref 3.5–5.1)
Sodium: 141 mmol/L (ref 135–145)
Total Bilirubin: 1 mg/dL (ref 0.3–1.2)
Total Protein: 7.3 g/dL (ref 6.5–8.1)

## 2023-02-09 LAB — CBC WITH DIFFERENTIAL/PLATELET
Abs Immature Granulocytes: 0.04 10*3/uL (ref 0.00–0.07)
Basophils Absolute: 0.1 10*3/uL (ref 0.0–0.1)
Basophils Relative: 1 %
Eosinophils Absolute: 0.3 10*3/uL (ref 0.0–0.5)
Eosinophils Relative: 2 %
HCT: 27.2 % — ABNORMAL LOW (ref 39.0–52.0)
Hemoglobin: 9.2 g/dL — ABNORMAL LOW (ref 13.0–17.0)
Immature Granulocytes: 0 %
Lymphocytes Relative: 19 %
Lymphs Abs: 2.4 10*3/uL (ref 0.7–4.0)
MCH: 31.1 pg (ref 26.0–34.0)
MCHC: 33.8 g/dL (ref 30.0–36.0)
MCV: 91.9 fL (ref 80.0–100.0)
Monocytes Absolute: 1.2 10*3/uL — ABNORMAL HIGH (ref 0.1–1.0)
Monocytes Relative: 9 %
Neutro Abs: 8.7 10*3/uL — ABNORMAL HIGH (ref 1.7–7.7)
Neutrophils Relative %: 69 %
Platelets: 387 10*3/uL (ref 150–400)
RBC: 2.96 MIL/uL — ABNORMAL LOW (ref 4.22–5.81)
RDW: 18.1 % — ABNORMAL HIGH (ref 11.5–15.5)
WBC: 12.7 10*3/uL — ABNORMAL HIGH (ref 4.0–10.5)
nRBC: 1.8 % — ABNORMAL HIGH (ref 0.0–0.2)

## 2023-02-09 LAB — RETICULOCYTES
Immature Retic Fract: 38.9 % — ABNORMAL HIGH (ref 2.3–15.9)
RBC.: 2.97 MIL/uL — ABNORMAL LOW (ref 4.22–5.81)
Retic Count, Absolute: 179.7 10*3/uL (ref 19.0–186.0)
Retic Ct Pct: 6.1 % — ABNORMAL HIGH (ref 0.4–3.1)

## 2023-02-09 MED ORDER — ACETAMINOPHEN 500 MG PO TABS
1000.0000 mg | ORAL_TABLET | Freq: Once | ORAL | Status: AC
  Administered 2023-02-09: 1000 mg via ORAL
  Filled 2023-02-09: qty 2

## 2023-02-09 MED ORDER — HYDROMORPHONE HCL 2 MG/ML IJ SOLN
2.0000 mg | Freq: Once | INTRAMUSCULAR | Status: AC
  Administered 2023-02-09: 2 mg via INTRAVENOUS
  Filled 2023-02-09: qty 1

## 2023-02-09 MED ORDER — KETOROLAC TROMETHAMINE 15 MG/ML IJ SOLN
30.0000 mg | Freq: Once | INTRAMUSCULAR | Status: AC
  Administered 2023-02-09: 30 mg via INTRAVENOUS
  Filled 2023-02-09: qty 2

## 2023-02-09 MED ORDER — ONDANSETRON HCL 4 MG/2ML IJ SOLN
4.0000 mg | Freq: Once | INTRAMUSCULAR | Status: AC
  Administered 2023-02-09: 4 mg via INTRAVENOUS
  Filled 2023-02-09: qty 2

## 2023-02-09 NOTE — ED Triage Notes (Signed)
Pt coming in for sickle cell pain crisis, began Saturday, pt states pain in mostly in left arm.

## 2023-02-09 NOTE — Discharge Instructions (Signed)
Thank you for coming to [facility] Emergency Department. You were seen for sickle cell crisis. We did an exam, labs, and imaging, and these showed no acute findings.  Please follow up with your primary care provider within 1 week. You received opiate pain medicines while in the ED here, please do not drive or operate heavy machinery.  Do not hesitate to return to the ED or call 911 if you experience: -Worsening symptoms -Lightheadedness, passing out -Fevers/chills -Anything else that concerns you

## 2023-02-09 NOTE — ED Provider Notes (Signed)
[facility] AT [facility] Provider Note   CSN: [id number] Arrival date & time: 02/09/23  0913     History  Chief Complaint  Patient presents with   Sickle Cell Pain Crisis    Jonathan [name] is a 22 y.o. male with sickle cell disease, HTN, asthma, s/p splenectomy, h/o acute chest syndrome, avascular necrosis s/p L hip replacement, chronic pain syndrome, opiate dependence and tolerance who presents for sickle cell pain crisis, began Saturday, pt states pain in mostly in left arm. All over arm. Denies trauma to the arm, joint swelling, numbness/tingling. Similar to prior crises. No CP, SOB, f/c. Per chart review, was recently seen at sickle cell center on 6/3 as well as 02/05/22 w/ dilaudid PCA for sickle cell crisis involving left arm. Tried to go there this morning but they were already at capacity. Had oxycodone refilled on 02/05/22 when he was at day hospital but the pharmacy wouldn't let him pick them up. Ran out of meds yesterday and has been using tylenol/ibuprofen. Last took ibuprofen at 0300 AM.    Sickle Cell Pain Crisis      Home Medications Prior to Admission medications   Medication Sig Start Date End Date Taking? Authorizing Provider  folic acid (FOLVITE) 1 MG tablet Take 1 tablet (1 mg total) by mouth daily. 12/02/22 12/02/23  Massie Maroon, FNP  hydroxyurea (HYDREA) 500 MG capsule Take 1 capsule (500 mg total) by mouth daily. May take with food to minimize GI side effects. 01/26/23   Massie Maroon, FNP  ibuprofen (ADVIL) 800 MG tablet Take 1 tablet (800 mg total) by mouth every 8 (eight) hours as needed. 01/26/23   Massie Maroon, FNP  Oxycodone HCl 10 MG TABS Take 1 tablet (10 mg total) by mouth every 6 (six) hours as needed. 02/10/23   Massie Maroon, FNP      Allergies    Allergy medication [diphenhydramine]    Review of Systems   Review of Systems A 10 point review of systems was performed and is negative unless otherwise  reported in HPI.  Physical Exam Updated Vital Signs BP (!) 132/90   Pulse 86   Temp 98.2 F (36.8 C) (Oral)   Resp 17   Ht 5\' 5"  (1.651 m)   Wt 68 kg   SpO2 98%   BMI 24.96 kg/m  Physical Exam General: Normal appearing male, lying in bed.  HEENT: Sclera anicteric, MMM, trachea midline.  Cardiology: RRR, no murmurs/rubs/gallops. Resp: Normal respiratory rate and effort. CTAB, no wheezes, rhonchi, crackles.  Abd: Soft, non-tender, non-distended. No rebound tenderness or guarding.  GU: Deferred. MSK: No peripheral edema or signs of trauma. No Joint effusions/erythema/induration of the LUE. FROM of LUE with intact radial pulse, intact sensation. Extremities without deformity or TTP. No cyanosis or clubbing. Skin: warm, dry.  Neuro: A&Ox4, CNs II-XII grossly intact. MAEs. Sensation grossly intact.  Psych: Normal mood and affect.   ED Results / Procedures / Treatments   Labs (all labs ordered are listed, but only abnormal results are displayed) Labs Reviewed  CBC WITH DIFFERENTIAL/PLATELET - Abnormal; Notable for the following components:      Result Value   WBC 12.7 (*)    RBC 2.96 (*)    Hemoglobin 9.2 (*)    HCT 27.2 (*)    RDW 18.1 (*)    nRBC 1.8 (*)    Neutro Abs 8.7 (*)    Monocytes Absolute 1.2 (*)  All other components within normal limits  COMPREHENSIVE METABOLIC PANEL - Abnormal; Notable for the following components:   Glucose, Bld 105 (*)    Calcium 8.8 (*)    All other components within normal limits  RETICULOCYTES - Abnormal; Notable for the following components:   Retic Ct Pct 6.1 (*)    RBC. 2.97 (*)    Immature Retic Fract 38.9 (*)    All other components within normal limits    EKG EKG Interpretation  Date/Time:  Monday February 09 2023 09:47:30 EDT Ventricular Rate:  96 PR Interval:  147 QRS Duration: 84 QT Interval:  344 QTC Calculation: 435 R Axis:   79 Text Interpretation: Sinus rhythm LVH by voltage Confirmed by Vivi Barrack [phone]) on  02/09/2023 10:05:19 AM  Radiology DG Chest Port 1 View  Result Date: 02/09/2023 CLINICAL DATA:  Sickle cell pain crisis.  Mostly left arm pain. EXAM: PORTABLE CHEST 1 VIEW COMPARISON:  Chest x-ray dated November 30, 2022. FINDINGS: The heart size and mediastinal contours are within normal limits. Normal pulmonary vascularity. Mild chronic scarring in the lingula. No focal consolidation, pleural effusion, or pneumothorax. Diffuse bony sclerosis related to sickle cell disease again noted. IMPRESSION: 1. No active disease. Electronically Signed   By: Obie Dredge M.D.   On: 02/09/2023 09:50    Procedures Procedures    Medications Ordered in ED Medications  acetaminophen (TYLENOL) tablet 1,000 mg (1,000 mg Oral Given 02/09/23 1017)  ketorolac (TORADOL) 15 MG/ML injection 30 mg (30 mg Intravenous Given 02/09/23 1016)  HYDROmorphone (DILAUDID) injection 2 mg (2 mg Intravenous Given 02/09/23 1017)  ondansetron (ZOFRAN) injection 4 mg (4 mg Intravenous Given 02/09/23 1022)  HYDROmorphone (DILAUDID) injection 2 mg (2 mg Intravenous Given 02/09/23 1133)  HYDROmorphone (DILAUDID) injection 2 mg (2 mg Intravenous Given 02/09/23 1238)    ED Course/ Medical Decision Making/ A&P                          Medical Decision Making Amount and/or Complexity of Data Reviewed Labs: ordered. Decision-making details documented in ED Course. Radiology: ordered. Decision-making details documented in ED Course.  Risk OTC drugs. Prescription drug management.    This patient presents to the ED for concern of L arm pain/sickle cell pain crisis, this involves an extensive number of treatment options, and is a complaint that carries with it a high risk of complications and morbidity.  HDS, overall well-appearing  MDM:    For Sickle Cell patient, consider significant/severe and potential life-threatening DDX including, but not limited to:   - Underlying occult infection - no joint effusions, erythema, induration of  the L arm. No c/f septic arthritis or cellulitis.  - Vaso-occlusive crisis - causing pain, bony infarction, AVN of femoral head, dactylitis - Aplastic crisis -No CP/SOB to indicate acute chest syndrome or acute coronary syndrome. CXR obtained d/t concern for radiating or referred pain with now 3rd presentation of LUE crisis. No abnormalities on CXR. EKG without signs of ischemia.      Clinical Course as of 02/09/23 1250  Mon Feb 09, 2023  1004 Hemoglobin(!): 9.2 Similar to priors [HN]  1004 WBC(!): 12.7 +Leukocytosis [HN]  1004 DG Chest Port 1 View 1. No active disease. [HN]  1017 Reticulocytes(!) Reticulocyte index >2, okay [HN]  1017 Comprehensive metabolic panel(!) Unremarkable in the context of this patient's presentation  [HN]  1102 Reassessment of pain 6/10 after dilaudid, toradol, tylenol, benadryl, zofran. Will do 2nd dose of  dilaudid.  [HN]  1247 Patient reevaluated, pain is 4/10. Labs okay. He requests a 3rd dose of dilaudid as he does not have pain medicine at home and then would like to be DC'd. Advised him to call his pharmacy about his home medications and f/u with his sickle cell provider. DC w/ discharge instructions/return precautions. All questions answered to patient's satisfaction. Friend is going to come pick him up, instructed not to drive or operate heavy machinery d/t opiates given here.  [HN]    Clinical Course User Index [HN] Loetta Rough, MD    Labs: I Ordered, and personally interpreted labs.  The pertinent results include:  those listed above  Imaging Studies ordered: I ordered imaging studies including CXR I independently visualized and interpreted imaging. I agree with the radiologist interpretation  Additional history obtained from chart review.    Reevaluation: After the interventions noted above, I reevaluated the patient and found that they have :improved  Social Determinants of Health: Patient lives independently   Disposition:   DC  Co morbidities that complicate the patient evaluation  Past Medical History:  Diagnosis Date   Acute chest syndrome(517.3) 08/26/2011   march 2013, Sept 2013   Asthma    Avascular necrosis of bone of left hip (HCC)    Sickle cell disease, type SS (HCC)      Medicines Meds ordered this encounter  Medications   acetaminophen (TYLENOL) tablet 1,000 mg   ketorolac (TORADOL) 15 MG/ML injection 30 mg   HYDROmorphone (DILAUDID) injection 2 mg   ondansetron (ZOFRAN) injection 4 mg   HYDROmorphone (DILAUDID) injection 2 mg   HYDROmorphone (DILAUDID) injection 2 mg    I have reviewed the patients home medicines and have made adjustments as needed  Problem List / ED Course: Problem List Items Addressed This Visit       Other   Sickle cell pain crisis (HCC) - Primary                This note was created using dictation software, which may contain spelling or grammatical errors.    Loetta Rough, MD 02/09/23 1250

## 2023-02-10 ENCOUNTER — Non-Acute Institutional Stay (HOSPITAL_COMMUNITY)
Admission: AD | Admit: 2023-02-10 | Discharge: 2023-02-10 | Disposition: A | Discharge status: Home / Self Care | Payer: Commercial Managed Care - PPO | Source: Ambulatory Visit | Attending: Internal Medicine | Admitting: Internal Medicine

## 2023-02-10 ENCOUNTER — Telehealth (HOSPITAL_COMMUNITY): Payer: Self-pay

## 2023-02-10 DIAGNOSIS — Z5982 Transportation insecurity: Secondary | ICD-10-CM | POA: Diagnosis not present

## 2023-02-10 DIAGNOSIS — G894 Chronic pain syndrome: Secondary | ICD-10-CM | POA: Insufficient documentation

## 2023-02-10 DIAGNOSIS — D5700 Hb-SS disease with crisis, unspecified: Secondary | ICD-10-CM | POA: Diagnosis present

## 2023-02-10 DIAGNOSIS — F1120 Opioid dependence, uncomplicated: Secondary | ICD-10-CM | POA: Diagnosis not present

## 2023-02-10 DIAGNOSIS — D638 Anemia in other chronic diseases classified elsewhere: Secondary | ICD-10-CM | POA: Diagnosis not present

## 2023-02-10 MED ORDER — NALOXONE HCL 0.4 MG/ML IJ SOLN: 0.4000 mg | INTRAMUSCULAR | Status: DC | PRN

## 2023-02-10 MED ORDER — HYDROMORPHONE 1 MG/ML IV SOLN
INTRAVENOUS | Status: DC
  Administered 2023-02-10: 30 mg via INTRAVENOUS
  Administered 2023-02-10: 8 mg via INTRAVENOUS
  Filled 2023-02-10: qty 30

## 2023-02-10 MED ORDER — KETOROLAC TROMETHAMINE 30 MG/ML IJ SOLN
15.0000 mg | Freq: Once | INTRAMUSCULAR | Status: AC
  Administered 2023-02-10: 15 mg via INTRAVENOUS
  Filled 2023-02-10: qty 1

## 2023-02-10 MED ORDER — ACETAMINOPHEN 500 MG PO TABS
1000.0000 mg | ORAL_TABLET | Freq: Once | ORAL | Status: AC
  Administered 2023-02-10: 1000 mg via ORAL
  Filled 2023-02-10: qty 2

## 2023-02-10 MED ORDER — SODIUM CHLORIDE 0.45 % IV SOLN: INTRAVENOUS | Status: DC

## 2023-02-10 MED ORDER — SODIUM CHLORIDE 0.9% FLUSH: 9.0000 mL | INTRAVENOUS | Status: DC | PRN

## 2023-02-10 MED ORDER — ONDANSETRON HCL 4 MG/2ML IJ SOLN: 4.0000 mg | Freq: Four times a day (QID) | INTRAMUSCULAR | Status: DC | PRN

## 2023-02-10 NOTE — Discharge Summary (Signed)
[facility] Discharge Summary   Patient ID: Jonathan [name] MRN: [id number] DOB/AGE: [birth year] 22 y.o.  Admit date: 02/10/2023 Discharge date: 02/10/2023  Primary Care Physician:  Quentin Angst, MD  Admission Diagnoses:  Active Problems:   Sickle cell pain crisis [facility]) Discharge Medications:  Allergies as of 02/10/2023       Reactions   Allergy Medication [diphenhydramine] Swelling, Other (See Comments)   Face swells.         Medication List     TAKE these medications    folic acid 1 MG tablet Commonly known as: FOLVITE Take 1 tablet (1 mg total) by mouth daily.   hydroxyurea 500 MG capsule Commonly known as: HYDREA Take 1 capsule (500 mg total) by mouth daily. May take with food to minimize GI side effects.   ibuprofen 800 MG tablet Commonly known as: ADVIL Take 1 tablet (800 mg total) by mouth every 8 (eight) hours as needed.   Oxycodone HCl 10 MG Tabs Take 1 tablet (10 mg total) by mouth every 6 (six) hours as needed.         Consults:  None  Significant Diagnostic Studies:  DG Chest Port 1 View  Result Date: 02/09/2023 CLINICAL DATA:  Sickle cell pain crisis.  Mostly left arm pain. EXAM: PORTABLE CHEST 1 VIEW COMPARISON:  Chest x-ray dated November 30, 2022. FINDINGS: The heart size and mediastinal contours are within normal limits. Normal pulmonary vascularity. Mild chronic scarring in the lingula. No focal consolidation, pleural effusion, or pneumothorax. Diffuse bony sclerosis related to sickle cell disease again noted. IMPRESSION: 1. No active disease. Electronically Signed   By: Obie Dredge M.D.   On: 02/09/2023 09:50     History of Present Illness: Jonathan [name] is a 22 year old male with a medical history significant for sickle cell disease, chronic pain syndrome, opiate dependence and tolerance, and history of anemia of chronic disease presents with complaints of left upper extremity pain.  Pain intensity has been elevated  over the past several days and unrelieved by his home medications.  He was treated and evaluated in the emergency department on yesterday for this problem.  He states that pain was well-controlled at discharge, but returned shortly after arriving home.  Patient was also evaluated and the sickle cell day clinic on last week for this problem.  He says that he has been taking ibuprofen for pain control without very much relief.  He is currently out of oxycodone.  Medication was written on 02/06/2023.  Patient denies headache, chest pain, urinary symptoms, nausea, vomiting, or diarrhea. [facility] Course: Patient admitted to sickle cell day infusion clinic for management of pain crisis. Reviewed previous laboratory values, consistent with patient's baseline.  Did not warrant repeating today. Pain managed with IV Dilaudid PCA, IV fluids, IV Toradol, and Tylenol.  Pain intensity decreased throughout admission and patient is requesting discharge home.  He was advised to return to clinic in a.m. if pain persists. Patient alert, oriented, and ambulating without assistance.  He will discharge home in a hemodynamically stable condition.  Discharge instructions: Resume all home medications.   Follow up with PCP as previously  scheduled.   Discussed the importance of drinking 64 ounces of water daily, dehydration of red blood cells may lead further sickling.   Avoid all stressors that precipitate sickle cell pain crisis.     The patient was given clear instructions to go to ER or return to medical center if symptoms do  not improve, worsen or new problems develop.   Physical Exam at Discharge:  BP 115/67 (BP Location: Right Arm)   Pulse 64   Temp 98 F (36.7 C) (Temporal)   Resp 16   SpO2 97%  Physical Exam Constitutional:      Appearance: Normal appearance.  Eyes:     Pupils: Pupils are equal, round, and reactive to light.  Cardiovascular:     Rate and Rhythm: Normal rate and  regular rhythm.  Pulmonary:     Effort: Pulmonary effort is normal.  Abdominal:     General: Bowel sounds are normal.  Musculoskeletal:        General: Normal range of motion.  Skin:    General: Skin is warm.  Neurological:     General: No focal deficit present.     Mental Status: He is alert. Mental status is at baseline.  Psychiatric:        Mood and Affect: Mood normal.        Behavior: Behavior normal.        Thought Content: Thought content normal.      Disposition at Discharge: Discharge disposition: 01-Home or Self Care       Discharge Orders: Discharge Instructions     Discharge patient   Complete by: As directed    Discharge disposition: 01-Home or Self Care   Discharge patient date: 02/10/2023       Condition at Discharge:   Stable  Time spent on Discharge:  Greater than 30 minutes.  Signed: [name]  APRN, MSN, FNP-C Patient Care [facility] [address] [place], [place] [address] [phone]  02/10/2023, 3:17 PM

## 2023-02-10 NOTE — Progress Notes (Signed)
Patient admitted to the day infusion hospital for sickle cell pain. Initially, patient reported left arm pain rated 7/10. For pain management, patient placed on Sickle Cell Dilaudid PCA, given IV Toradol, Tylenol and hydrated with IV fluids. At discharge, patient rated pain at 2/10. Vital signs stable. Printed AVS offered but patient refused. Patient alert, oriented and ambulatory at discharge.

## 2023-02-10 NOTE — H&P (Signed)
[facility] History and Physical   Date: 02/10/2023  Patient name: Jonathan [name] Trinka Medical record number: [id number] Date of birth: 10-27-2000 Age: 22 y.o. Gender: male PCP: Quentin Angst, MD  Attending physician: Quentin Angst, MD  Chief Complaint: Sickle cell pain  History of Present Illness: Jonathan [name] is a 22 year old male with a medical history significant for sickle cell disease, chronic pain syndrome, opiate dependence and tolerance, and history of anemia of chronic disease presents with complaints of left upper extremity pain.  Pain intensity has been elevated over the past several days and unrelieved by his home medications.  He was treated and evaluated in the emergency department on yesterday for this problem.  He states that pain was well-controlled at discharge, but returned shortly after arriving home.  Patient was also evaluated and the sickle cell day clinic on last week for this problem.  He says that he has been taking ibuprofen for pain control without very much relief.  He is currently out of oxycodone.  Medication was written on 02/06/2023.  Patient denies headache, chest pain, urinary symptoms, nausea, vomiting, or diarrhea.  Meds: Medications Prior to Admission  Medication Sig Dispense Refill Last Dose   folic acid (FOLVITE) 1 MG tablet Take 1 tablet (1 mg total) by mouth daily. 90 tablet 3 Past Week   hydroxyurea (HYDREA) 500 MG capsule Take 1 capsule (500 mg total) by mouth daily. May take with food to minimize GI side effects. 90 capsule 3 02/10/2023   ibuprofen (ADVIL) 800 MG tablet Take 1 tablet (800 mg total) by mouth every 8 (eight) hours as needed. 30 tablet 4 02/10/2023   Oxycodone HCl 10 MG TABS Take 1 tablet (10 mg total) by mouth every 6 (six) hours as needed. 60 tablet 0 Past Week    Allergies: Allergy medication [diphenhydramine] Past Medical History:  Diagnosis Date   Acute chest syndrome(517.3) 08/26/2011   march 2013,  Sept 2013   Asthma    Avascular necrosis of bone of left hip (HCC)    Sickle cell disease, type SS (HCC)    Past Surgical History:  Procedure Laterality Date   ADENOIDECTOMY     SPLENECTOMY     TONSILLECTOMY     TONSILLECTOMY AND ADENOIDECTOMY     TOTAL HIP ARTHROPLASTY Left 03/07/2016   UMBILICAL HERNIA REPAIR     Family History  Problem Relation Age of Onset   Hypertension Mother    Hypertension Maternal Grandmother    Hypertension Maternal Grandfather    Asthma Paternal Grandfather    Diabetes Paternal Grandfather    Social History   Socioeconomic History   Marital status: Single    Spouse name: Not on file   Number of children: Not on file   Years of education: Not on file   Highest education level: Not on file  Occupational History   Not on file  Tobacco Use   Smoking status: Never   Smokeless tobacco: Never  Vaping Use   Vaping Use: Never used  Substance and Sexual Activity   Alcohol use: No    Alcohol/week: 0.0 standard drinks of alcohol   Drug use: No   Sexual activity: Never  Other Topics Concern   Not on file  Social History Narrative   Lives with mother.  No smokers at home      Update:       05/02/16 Lives with Mother only   Social Determinants of Health   Financial Resource Strain: Not  on file  Food Insecurity: No Food Insecurity (08/12/2022)   Hunger Vital Sign    Worried About Running Out of Food in the Last Year: Never true    Ran Out of Food in the Last Year: Never true  Transportation Needs: Unmet Transportation Needs (08/12/2022)   PRAPARE - Administrator, Civil Service (Medical): Yes    Lack of Transportation (Non-Medical): Yes  Physical Activity: Not on file  Stress: Not on file  Social Connections: Not on file  Intimate Partner Violence: Not At Risk (08/12/2022)   Humiliation, Afraid, Rape, and Kick questionnaire    Fear of Current or Ex-Partner: No    Emotionally Abused: No    Physically Abused: No    Sexually  Abused: No  Review of Systems  Constitutional: Negative.   HENT: Negative.    Eyes: Negative.   Respiratory: Negative.    Cardiovascular: Negative.   Gastrointestinal: Negative.   Genitourinary: Negative.   Musculoskeletal:  Positive for back pain and joint pain.  Skin: Negative.   Neurological: Negative.   Psychiatric/Behavioral: Negative.      Physical Exam: Blood pressure 123/71, pulse 82, temperature 98 F (36.7 C), temperature source Temporal, resp. rate 16, SpO2 96 %. Physical Exam Constitutional:      Appearance: Normal appearance.  Eyes:     Pupils: Pupils are equal, round, and reactive to light.  Cardiovascular:     Rate and Rhythm: Normal rate and regular rhythm.  Pulmonary:     Effort: Pulmonary effort is normal.  Abdominal:     General: Bowel sounds are normal.  Musculoskeletal:        General: Normal range of motion.  Skin:    General: Skin is warm.  Neurological:     General: No focal deficit present.     Mental Status: He is alert. Mental status is at baseline.  Psychiatric:        Mood and Affect: Mood normal.        Behavior: Behavior normal.        Thought Content: Thought content normal.        Judgment: Judgment normal.      Lab results: Results for orders placed or performed during the hospital encounter of 02/09/23 (from the past 24 hour(s))  CBC WITH DIFFERENTIAL     Status: Abnormal   Collection Time: 02/09/23  9:38 AM  Result Value Ref Range   WBC 12.7 (H) 4.0 - 10.5 K/uL   RBC 2.96 (L) 4.22 - 5.81 MIL/uL   Hemoglobin 9.2 (L) 13.0 - 17.0 g/dL   HCT 82.9 (L) 56.2 - 13.0 %   MCV 91.9 80.0 - 100.0 fL   MCH 31.1 26.0 - 34.0 pg   MCHC 33.8 30.0 - 36.0 g/dL   RDW 86.5 (H) 78.4 - 69.6 %   Platelets 387 150 - 400 K/uL   nRBC 1.8 (H) 0.0 - 0.2 %   Neutrophils Relative % 69 %   Neutro Abs 8.7 (H) 1.7 - 7.7 K/uL   Lymphocytes Relative 19 %   Lymphs Abs 2.4 0.7 - 4.0 K/uL   Monocytes Relative 9 %   Monocytes Absolute 1.2 (H) 0.1 - 1.0 K/uL    Eosinophils Relative 2 %   Eosinophils Absolute 0.3 0.0 - 0.5 K/uL   Basophils Relative 1 %   Basophils Absolute 0.1 0.0 - 0.1 K/uL   Immature Granulocytes 0 %   Abs Immature Granulocytes 0.04 0.00 - 0.07 K/uL  Comprehensive metabolic panel  Status: Abnormal   Collection Time: 02/09/23  9:38 AM  Result Value Ref Range   Sodium 141 135 - 145 mmol/L   Potassium 3.8 3.5 - 5.1 mmol/L   Chloride 109 98 - 111 mmol/L   CO2 23 22 - 32 mmol/L   Glucose, Bld 105 (H) 70 - 99 mg/dL   BUN 6 6 - 20 mg/dL   Creatinine, Ser 9.60 0.61 - 1.24 mg/dL   Calcium 8.8 (L) 8.9 - 10.3 mg/dL   Total Protein 7.3 6.5 - 8.1 g/dL   Albumin 3.7 3.5 - 5.0 g/dL   AST 22 15 - 41 U/L   ALT 12 0 - 44 U/L   Alkaline Phosphatase 97 38 - 126 U/L   Total Bilirubin 1.0 0.3 - 1.2 mg/dL   GFR, Estimated >45 >40 mL/min   Anion gap 9 5 - 15  Reticulocytes     Status: Abnormal   Collection Time: 02/09/23  9:41 AM  Result Value Ref Range   Retic Ct Pct 6.1 (H) 0.4 - 3.1 %   RBC. 2.97 (L) 4.22 - 5.81 MIL/uL   Retic Count, Absolute 179.7 19.0 - 186.0 K/uL   Immature Retic Fract 38.9 (H) 2.3 - 15.9 %    Imaging results:  DG Chest Port 1 View  Result Date: 02/09/2023 CLINICAL DATA:  Sickle cell pain crisis.  Mostly left arm pain. EXAM: PORTABLE CHEST 1 VIEW COMPARISON:  Chest x-ray dated November 30, 2022. FINDINGS: The heart size and mediastinal contours are within normal limits. Normal pulmonary vascularity. Mild chronic scarring in the lingula. No focal consolidation, pleural effusion, or pneumothorax. Diffuse bony sclerosis related to sickle cell disease again noted. IMPRESSION: 1. No active disease. Electronically Signed   By: Obie Dredge M.D.   On: 02/09/2023 09:50     Assessment & Plan: Patient admitted to sickle cell day infusion center for management of pain crisis.  Patient is opiate tolerant Initiate IV dilaudid PCA.  IV fluids, 0.45% saline at 100 ml/hr Toradol 15 mg IV times one dose Tylenol 1000 mg by  mouth times one dose Review CBC with differential, complete metabolic panel, and reticulocytes as results become available. Baseline hemoglobin is Pain intensity will be reevaluated in context of functioning and relationship to baseline as care progresses If pain intensity remains elevated and/or sudden change in hemodynamic stability transition to inpatient services for higher level of care.      [name]  APRN, MSN, FNP-C Patient Care [facility] [address] [place], [place] [address] [phone]  02/10/2023, 9:34 AM

## 2023-02-10 NOTE — Telephone Encounter (Signed)
Pt called day hospital wanting to come in today for sickle cell pain treatment. Pt reports 7/10 pain to right arm. Pt reports taking Ibuprofen at 5 AM. Pt states that he is out of Oxycodone, and that his pharmacy has not called him yet. Pt denies fever, chest pain, N/V/D, abdominal pain, chest pain, and priapism. [place] Hollis, FNP notified. Per provider pts medication was sent in. Pt can come to day hospital today for treatment. RN advised pt to call his pharmacy to check on if he can pick up his medication. Pt notified and verbalized understanding. Pt states that his uncle will be his transportation today.

## 2023-02-11 ENCOUNTER — Telehealth (HOSPITAL_COMMUNITY): Payer: Self-pay

## 2023-02-11 ENCOUNTER — Non-Acute Institutional Stay (HOSPITAL_COMMUNITY)
Admission: AD | Admit: 2023-02-11 | Discharge: 2023-02-11 | Disposition: A | Discharge status: Home / Self Care | Payer: Commercial Managed Care - PPO | Source: Ambulatory Visit | Attending: Internal Medicine | Admitting: Internal Medicine

## 2023-02-11 DIAGNOSIS — Z5982 Transportation insecurity: Secondary | ICD-10-CM | POA: Diagnosis not present

## 2023-02-11 DIAGNOSIS — D5700 Hb-SS disease with crisis, unspecified: Secondary | ICD-10-CM | POA: Diagnosis not present

## 2023-02-11 LAB — COMPREHENSIVE METABOLIC PANEL
ALT: 13 U/L (ref 0–44)
AST: 29 U/L (ref 15–41)
Albumin: 3.7 g/dL (ref 3.5–5.0)
Alkaline Phosphatase: 101 U/L (ref 38–126)
Anion gap: 7 (ref 5–15)
BUN: 6 mg/dL (ref 6–20)
CO2: 24 mmol/L (ref 22–32)
Calcium: 8.6 mg/dL — ABNORMAL LOW (ref 8.9–10.3)
Chloride: 107 mmol/L (ref 98–111)
Creatinine, Ser: 0.76 mg/dL (ref 0.61–1.24)
GFR, Estimated: >60 mL/min (ref >60)
Glucose, Bld: 108 mg/dL — ABNORMAL HIGH (ref 70–99)
Potassium: 4.4 mmol/L (ref 3.5–5.1)
Sodium: 138 mmol/L (ref 135–145)
Total Bilirubin: 1.1 mg/dL (ref 0.3–1.2)
Total Protein: 7.6 g/dL (ref 6.5–8.1)

## 2023-02-11 LAB — CBC WITH DIFFERENTIAL/PLATELET
Abs Immature Granulocytes: 0.04 10*3/uL (ref 0.00–0.07)
Basophils Absolute: 0.1 10*3/uL (ref 0.0–0.1)
Basophils Relative: 1 %
Eosinophils Absolute: 0.2 10*3/uL (ref 0.0–0.5)
Eosinophils Relative: 2 %
HCT: 24.9 % — ABNORMAL LOW (ref 39.0–52.0)
Hemoglobin: 8.3 g/dL — ABNORMAL LOW (ref 13.0–17.0)
Immature Granulocytes: 0 %
Lymphocytes Relative: 12 %
Lymphs Abs: 1.4 10*3/uL (ref 0.7–4.0)
MCH: 30.3 pg (ref 26.0–34.0)
MCHC: 33.3 g/dL (ref 30.0–36.0)
MCV: 90.9 fL (ref 80.0–100.0)
Monocytes Absolute: 0.5 10*3/uL (ref 0.1–1.0)
Monocytes Relative: 5 %
Neutro Abs: 9 10*3/uL — ABNORMAL HIGH (ref 1.7–7.7)
Neutrophils Relative %: 80 %
Platelets: 445 10*3/uL — ABNORMAL HIGH (ref 150–400)
RBC: 2.74 MIL/uL — ABNORMAL LOW (ref 4.22–5.81)
RDW: 17.7 % — ABNORMAL HIGH (ref 11.5–15.5)
WBC: 11.2 10*3/uL — ABNORMAL HIGH (ref 4.0–10.5)
nRBC: 1.3 % — ABNORMAL HIGH (ref 0.0–0.2)

## 2023-02-11 LAB — RETICULOCYTES
Immature Retic Fract: 27.2 % — ABNORMAL HIGH (ref 2.3–15.9)
RBC.: 2.75 MIL/uL — ABNORMAL LOW (ref 4.22–5.81)
Retic Count, Absolute: 129.8 10*3/uL (ref 19.0–186.0)
Retic Ct Pct: 4.7 % — ABNORMAL HIGH (ref 0.4–3.1)

## 2023-02-11 MED ORDER — KETOROLAC TROMETHAMINE 30 MG/ML IJ SOLN
15.0000 mg | Freq: Once | INTRAMUSCULAR | Status: AC
  Administered 2023-02-11: 15 mg via INTRAVENOUS
  Filled 2023-02-11: qty 1

## 2023-02-11 MED ORDER — HYDROMORPHONE 1 MG/ML IV SOLN
INTRAVENOUS | Status: DC
  Administered 2023-02-11: 7.5 mg via INTRAVENOUS
  Administered 2023-02-11: 30 mg via INTRAVENOUS
  Filled 2023-02-11: qty 30

## 2023-02-11 MED ORDER — SODIUM CHLORIDE 0.45 % IV SOLN: INTRAVENOUS | Status: DC

## 2023-02-11 MED ORDER — NALOXONE HCL 0.4 MG/ML IJ SOLN: 0.4000 mg | INTRAMUSCULAR | Status: DC | PRN

## 2023-02-11 MED ORDER — SODIUM CHLORIDE 0.9% FLUSH: 9.0000 mL | INTRAVENOUS | Status: DC | PRN

## 2023-02-11 MED ORDER — ACETAMINOPHEN 500 MG PO TABS
1000.0000 mg | ORAL_TABLET | Freq: Once | ORAL | Status: AC
  Administered 2023-02-11: 1000 mg via ORAL
  Filled 2023-02-11: qty 2

## 2023-02-11 MED ORDER — ONDANSETRON HCL 4 MG/2ML IJ SOLN
4.0000 mg | Freq: Four times a day (QID) | INTRAMUSCULAR | Status: DC | PRN
  Administered 2023-02-11: 4 mg via INTRAVENOUS
  Filled 2023-02-11: qty 2

## 2023-02-11 NOTE — H&P (Signed)
[facility] History and Physical   Date: 02/11/2023  Patient name: Jonathan [name] Medical record number: [id number] Date of birth: 29-Jan-2001 Age: 22 y.o. Gender: male PCP: Quentin Angst, MD  Attending physician: Quentin Angst, MD  Chief Complaint: Sickle cell pain  History of Present Illness: Jonathan [name] is a very pleasant 22 year old male with a medical history significant for sickle cell disease presents with complaints of pain primarily to left upper extremity.  Patient has been treated and evaluated in the sickle cell clinic on bone 02/09/2023 and 02/10/2023 without sustained relief.  Patient was offered admission on yesterday, he declined due to personal constraints.,  Patient rates pain as 8/10 unrelieved by his home oxycodone last taken this a.m.  Patient denies any fever, chills, headache, shortness of breath, urinary symptoms, nausea, vomiting, or diarrhea.  No sick contacts or recent travel.  Meds: Medications Prior to Admission  Medication Sig Dispense Refill Last Dose   folic acid (FOLVITE) 1 MG tablet Take 1 tablet (1 mg total) by mouth daily. 90 tablet 3    hydroxyurea (HYDREA) 500 MG capsule Take 1 capsule (500 mg total) by mouth daily. May take with food to minimize GI side effects. 90 capsule 3    ibuprofen (ADVIL) 800 MG tablet Take 1 tablet (800 mg total) by mouth every 8 (eight) hours as needed. 30 tablet 4    Oxycodone HCl 10 MG TABS Take 1 tablet (10 mg total) by mouth every 6 (six) hours as needed. 60 tablet 0     Allergies: Allergy medication [diphenhydramine] Past Medical History:  Diagnosis Date   Acute chest syndrome(517.3) 08/26/2011   march 2013, Sept 2013   Asthma    Avascular necrosis of bone of left hip (HCC)    Sickle cell disease, type SS (HCC)    Past Surgical History:  Procedure Laterality Date   ADENOIDECTOMY     SPLENECTOMY     TONSILLECTOMY     TONSILLECTOMY AND ADENOIDECTOMY     TOTAL HIP ARTHROPLASTY Left  03/07/2016   UMBILICAL HERNIA REPAIR     Family History  Problem Relation Age of Onset   Hypertension Mother    Hypertension Maternal Grandmother    Hypertension Maternal Grandfather    Asthma Paternal Grandfather    Diabetes Paternal Grandfather    Social History   Socioeconomic History   Marital status: Single    Spouse name: Not on file   Number of children: Not on file   Years of education: Not on file   Highest education level: Not on file  Occupational History   Not on file  Tobacco Use   Smoking status: Never   Smokeless tobacco: Never  Vaping Use   Vaping Use: Never used  Substance and Sexual Activity   Alcohol use: No    Alcohol/week: 0.0 standard drinks of alcohol   Drug use: No   Sexual activity: Never  Other Topics Concern   Not on file  Social History Narrative   Lives with mother.  No smokers at home      Update:       05/02/16 Lives with Mother only   Social Determinants of Health   Financial Resource Strain: Not on file  Food Insecurity: No Food Insecurity (08/12/2022)   Hunger Vital Sign    Worried About Running Out of Food in the Last Year: Never true    Ran Out of Food in the Last Year: Never true  Transportation Needs: Unmet  Transportation Needs (08/12/2022)   PRAPARE - Administrator, Civil Service (Medical): Yes    Lack of Transportation (Non-Medical): Yes  Physical Activity: Not on file  Stress: Not on file  Social Connections: Not on file  Intimate Partner Violence: Not At Risk (08/12/2022)   Humiliation, Afraid, Rape, and Kick questionnaire    Fear of Current or Ex-Partner: No    Emotionally Abused: No    Physically Abused: No    Sexually Abused: No  Review of Systems  Constitutional:  Negative for chills and fever.  HENT: Negative.    Eyes: Negative.   Respiratory: Negative.    Cardiovascular: Negative.   Gastrointestinal: Negative.   Musculoskeletal:  Positive for back pain and joint pain.  Skin: Negative.    Neurological: Negative.   Psychiatric/Behavioral: Negative.      Physical Exam Constitutional:      Appearance: Normal appearance.  Cardiovascular:     Rate and Rhythm: Normal rate and regular rhythm.  Pulmonary:     Effort: Pulmonary effort is normal.  Abdominal:     General: Bowel sounds are normal.  Musculoskeletal:        General: Normal range of motion.  Skin:    General: Skin is warm.  Neurological:     General: No focal deficit present.     Mental Status: He is alert. Mental status is at baseline.  Psychiatric:        Mood and Affect: Mood normal.        Behavior: Behavior normal.        Thought Content: Thought content normal.        Judgment: Judgment normal.       Lab results: No results found for this or any previous visit (from the past 24 hour(s)).  Imaging results:  DG Chest Port 1 View  Result Date: 02/09/2023 CLINICAL DATA:  Sickle cell pain crisis.  Mostly left arm pain. EXAM: PORTABLE CHEST 1 VIEW COMPARISON:  Chest x-ray dated November 30, 2022. FINDINGS: The heart size and mediastinal contours are within normal limits. Normal pulmonary vascularity. Mild chronic scarring in the lingula. No focal consolidation, pleural effusion, or pneumothorax. Diffuse bony sclerosis related to sickle cell disease again noted. IMPRESSION: 1. No active disease. Electronically Signed   By: Obie Dredge M.D.   On: 02/09/2023 09:50     Assessment & Plan: Patient admitted to sickle cell day infusion center for management of pain crisis.  Patient is opiate tolerant Initiate IV dilaudid PCA.  IV fluids, 0.45% saline at 100 mL/h Toradol 15 mg IV times one dose Tylenol 1000 mg by mouth times one dose Review CBC with differential, complete metabolic panel, and reticulocytes as results become available.  Pain intensity will be reevaluated in context of functioning and relationship to baseline as care progresses If pain intensity remains elevated and/or sudden change in  hemodynamic stability transition to inpatient services for higher level of care.      [name]  APRN, MSN, FNP-C Patient Care [facility] [address] [place], [place] [address] [phone]  02/11/2023, 9:06 AM

## 2023-02-11 NOTE — Discharge Summary (Signed)
[facility] Discharge Summary   Patient ID: Jonathan [name] MRN: [id number] DOB/AGE: December 16, 2000 21 y.o.  Admit date: 02/11/2023 Discharge date: 02/11/2023  Primary Care Physician:  Quentin Angst, MD  Admission Diagnoses:  Active Problems:   Sickle cell pain crisis [facility])    Discharge Medications:  Allergies as of 02/11/2023       Reactions   Allergy Medication [diphenhydramine] Swelling, Other (See Comments)   Face swells.         Medication List     TAKE these medications    folic acid 1 MG tablet Commonly known as: FOLVITE Take 1 tablet (1 mg total) by mouth daily.   hydroxyurea 500 MG capsule Commonly known as: HYDREA Take 1 capsule (500 mg total) by mouth daily. May take with food to minimize GI side effects.   ibuprofen 800 MG tablet Commonly known as: ADVIL Take 1 tablet (800 mg total) by mouth every 8 (eight) hours as needed.   Oxycodone HCl 10 MG Tabs Take 1 tablet (10 mg total) by mouth every 6 (six) hours as needed.         Consults:  None  Significant Diagnostic Studies:  DG Chest Port 1 View  Result Date: 02/09/2023 CLINICAL DATA:  Sickle cell pain crisis.  Mostly left arm pain. EXAM: PORTABLE CHEST 1 VIEW COMPARISON:  Chest x-ray dated November 30, 2022. FINDINGS: The heart size and mediastinal contours are within normal limits. Normal pulmonary vascularity. Mild chronic scarring in the lingula. No focal consolidation, pleural effusion, or pneumothorax. Diffuse bony sclerosis related to sickle cell disease again noted. IMPRESSION: 1. No active disease. Electronically Signed   By: Obie Dredge M.D.   On: 02/09/2023 09:50     History of Present Illness: Jonathan [name] is a very pleasant 22 year old male with a medical history significant for sickle cell disease presents with complaints of pain primarily to left upper extremity.  Patient has been treated and evaluated in the sickle cell clinic on bone 02/09/2023 and 02/10/2023  without sustained relief.  Patient was offered admission on yesterday, he declined due to personal constraints.,  Patient rates pain as 8/10 unrelieved by his home oxycodone last taken this a.m.  Patient denies any fever, chills, headache, shortness of breath, urinary symptoms, nausea, vomiting, or diarrhea.  No sick contacts or recent travel. [facility] Course: Patient admitted to sickle cell day infusion clinic for management of pain crisis.  Patient has had increased day hospital admissions over the past several weeks. Pain has been primarily to left upper extremity. Pain managed with IV Dilaudid PCA, IV Toradol, Tylenol, and IV fluids. Pain intensity decreased to 3/10 throughout admission and patient is requesting discharge home.  Patient advised to resume oxycodone, ibuprofen, and Tylenol as directed.  Patient will follow-up in primary care as scheduled. He is alert, oriented, and ambulating without assistance.  He will discharge home in a hemodynamically stable condition.  Discharge instructions: Resume all home medications.   Follow up with PCP as previously  scheduled.   Discussed the importance of drinking 64 ounces of water daily, dehydration of red blood cells may lead further sickling.   Avoid all stressors that precipitate sickle cell pain crisis.     The patient was given clear instructions to go to ER or return to medical center if symptoms do not improve, worsen or new problems develop.   Physical Exam at Discharge:  BP 125/77 (BP Location: Right Arm)   Pulse (!) 59  Temp 98 F (36.7 C) (Temporal)   Resp 12   SpO2 94%  Physical Exam Eyes:     Pupils: Pupils are equal, round, and reactive to light.  Cardiovascular:     Rate and Rhythm: Normal rate and regular rhythm.  Abdominal:     General: Bowel sounds are normal.  Musculoskeletal:        General: Normal range of motion.  Skin:    General: Skin is warm.  Neurological:     General: No focal  deficit present.     Mental Status: Mental status is at baseline.     Disposition at Discharge: Discharge disposition: 01-Home or Self Care       Discharge Orders: Discharge Instructions     Discharge patient   Complete by: As directed    Discharge disposition: 01-Home or Self Care   Discharge patient date: 02/11/2023       Condition at Discharge:   Stable  Time spent on Discharge:  Greater than 30 minutes.  Signed: [name]  APRN, MSN, FNP-C Patient Care [facility] [address] [place], [place] [address] [phone]  02/11/2023, 3:10 PM

## 2023-02-11 NOTE — Telephone Encounter (Signed)
Pt called day hospital wanting to come in today for sickle cell pain treatment. Pt reports 8/10 pain to back. Pt states that she took Oxycodone 10 mg at 5 AM. Pt denies being to ER recently. Pt screened for COVID and denies symptoms and exposures. Pt denies fever, chest pain, N/V/D, abdominal pain, and priapism. [place] Hollis, FNP notified and she said that pt can come to day hospital today. Pt notified and verbalized understanding. Pt states that his mom will be his transportation today.

## 2023-02-11 NOTE — Progress Notes (Signed)
Pt admitted to day hospital today for sickle cell pain treatment. On arrival, patient rates 8/10 pain to back. Pt received Dilaudid PCA, IV Toradol, IV Zofran, and hydrated with IV fluids via PIV. Pt also received PO Tylenol. AT discharge, pt rates pain 2/10. AVS offered, but pt declined. Pt is alert, oriented, and ambulatory at discharge.

## 2023-02-27 ENCOUNTER — Telehealth (HOSPITAL_COMMUNITY): Payer: Self-pay

## 2023-02-27 ENCOUNTER — Non-Acute Institutional Stay (HOSPITAL_COMMUNITY)
Admission: AD | Admit: 2023-02-27 | Discharge: 2023-02-27 | Disposition: A | Discharge status: Home / Self Care | Payer: 59 | Attending: Internal Medicine | Admitting: Internal Medicine

## 2023-02-27 ENCOUNTER — Other Ambulatory Visit: Payer: Self-pay | Admitting: Family Medicine

## 2023-02-27 ENCOUNTER — Telehealth: Payer: Self-pay | Admitting: Internal Medicine

## 2023-02-27 DIAGNOSIS — D638 Anemia in other chronic diseases classified elsewhere: Secondary | ICD-10-CM | POA: Insufficient documentation

## 2023-02-27 DIAGNOSIS — D5700 Hb-SS disease with crisis, unspecified: Secondary | ICD-10-CM

## 2023-02-27 DIAGNOSIS — D571 Sickle-cell disease without crisis: Secondary | ICD-10-CM

## 2023-02-27 DIAGNOSIS — G894 Chronic pain syndrome: Secondary | ICD-10-CM | POA: Diagnosis not present

## 2023-02-27 DIAGNOSIS — Z5982 Transportation insecurity: Secondary | ICD-10-CM | POA: Insufficient documentation

## 2023-02-27 DIAGNOSIS — Z79891 Long term (current) use of opiate analgesic: Secondary | ICD-10-CM

## 2023-02-27 DIAGNOSIS — F1120 Opioid dependence, uncomplicated: Secondary | ICD-10-CM | POA: Insufficient documentation

## 2023-02-27 LAB — CBC WITH DIFFERENTIAL/PLATELET
Abs Immature Granulocytes: 0.05 10*3/uL (ref 0.00–0.07)
Basophils Absolute: 0.1 10*3/uL (ref 0.0–0.1)
Basophils Relative: 1 %
Eosinophils Absolute: 0.3 10*3/uL (ref 0.0–0.5)
Eosinophils Relative: 2 %
HCT: 33.6 % — ABNORMAL LOW (ref 39.0–52.0)
Hemoglobin: 11.2 g/dL — ABNORMAL LOW (ref 13.0–17.0)
Immature Granulocytes: 1 %
Lymphocytes Relative: 28 %
Lymphs Abs: 3 10*3/uL (ref 0.7–4.0)
MCH: 30.1 pg (ref 26.0–34.0)
MCHC: 33.3 g/dL (ref 30.0–36.0)
MCV: 90.3 fL (ref 80.0–100.0)
Monocytes Absolute: 0.9 10*3/uL (ref 0.1–1.0)
Monocytes Relative: 9 %
Neutro Abs: 6.3 10*3/uL (ref 1.7–7.7)
Neutrophils Relative %: 59 %
Platelets: 487 10*3/uL — ABNORMAL HIGH (ref 150–400)
RBC: 3.72 MIL/uL — ABNORMAL LOW (ref 4.22–5.81)
RDW: 19.4 % — ABNORMAL HIGH (ref 11.5–15.5)
WBC: 10.6 10*3/uL — ABNORMAL HIGH (ref 4.0–10.5)
nRBC: 0.6 % — ABNORMAL HIGH (ref 0.0–0.2)

## 2023-02-27 LAB — RETICULOCYTES
Immature Retic Fract: 40.5 % — ABNORMAL HIGH (ref 2.3–15.9)
RBC.: 3.7 MIL/uL — ABNORMAL LOW (ref 4.22–5.81)
Retic Count, Absolute: 250.9 10*3/uL — ABNORMAL HIGH (ref 19.0–186.0)
Retic Ct Pct: 6.8 % — ABNORMAL HIGH (ref 0.4–3.1)

## 2023-02-27 LAB — COMPREHENSIVE METABOLIC PANEL
ALT: 10 U/L (ref 0–44)
AST: 21 U/L (ref 15–41)
Albumin: 4.6 g/dL (ref 3.5–5.0)
Alkaline Phosphatase: 120 U/L (ref 38–126)
Anion gap: 10 (ref 5–15)
BUN: 8 mg/dL (ref 6–20)
CO2: 21 mmol/L — ABNORMAL LOW (ref 22–32)
Calcium: 9.4 mg/dL (ref 8.9–10.3)
Chloride: 107 mmol/L (ref 98–111)
Creatinine, Ser: 0.84 mg/dL (ref 0.61–1.24)
GFR, Estimated: >60 mL/min (ref >60)
Glucose, Bld: 101 mg/dL — ABNORMAL HIGH (ref 70–99)
Potassium: 3.6 mmol/L (ref 3.5–5.1)
Sodium: 138 mmol/L (ref 135–145)
Total Bilirubin: 1.8 mg/dL — ABNORMAL HIGH (ref 0.3–1.2)
Total Protein: 8.1 g/dL (ref 6.5–8.1)

## 2023-02-27 MED ORDER — ACETAMINOPHEN 500 MG PO TABS
1000.0000 mg | ORAL_TABLET | Freq: Once | ORAL | Status: AC
  Administered 2023-02-27: 1000 mg via ORAL
  Filled 2023-02-27: qty 2

## 2023-02-27 MED ORDER — ONDANSETRON HCL 4 MG/2ML IJ SOLN: 4.0000 mg | Freq: Four times a day (QID) | INTRAMUSCULAR | Status: DC | PRN

## 2023-02-27 MED ORDER — SODIUM CHLORIDE 0.45 % IV SOLN: INTRAVENOUS | Status: DC

## 2023-02-27 MED ORDER — HYDROMORPHONE 1 MG/ML IV SOLN
INTRAVENOUS | Status: DC
  Administered 2023-02-27: 30 mg via INTRAVENOUS
  Administered 2023-02-27: 9.5 mg via INTRAVENOUS
  Filled 2023-02-27: qty 30

## 2023-02-27 MED ORDER — NALOXONE HCL 0.4 MG/ML IJ SOLN: 0.4000 mg | INTRAMUSCULAR | Status: DC | PRN

## 2023-02-27 MED ORDER — IBUPROFEN 800 MG PO TABS: 800.0000 mg | ORAL_TABLET | Freq: Three times a day (TID) | ORAL | 4 refills | Status: DC | PRN

## 2023-02-27 MED ORDER — HYDROXYUREA 500 MG PO CAPS: 500.0000 mg | ORAL_CAPSULE | Freq: Every day | ORAL | 3 refills | Status: DC

## 2023-02-27 MED ORDER — OXYCODONE HCL 10 MG PO TABS
10.0000 mg | ORAL_TABLET | Freq: Four times a day (QID) | ORAL | 0 refills | Status: DC | PRN
Start: 2023-02-27 — End: 2023-03-25

## 2023-02-27 MED ORDER — KETOROLAC TROMETHAMINE 30 MG/ML IJ SOLN
15.0000 mg | Freq: Once | INTRAMUSCULAR | Status: AC
  Administered 2023-02-27: 15 mg via INTRAVENOUS
  Filled 2023-02-27: qty 1

## 2023-02-27 MED ORDER — SODIUM CHLORIDE 0.9% FLUSH: 9.0000 mL | INTRAVENOUS | Status: DC | PRN

## 2023-02-27 NOTE — Progress Notes (Signed)
Reviewed PDMP substance reporting system prior to prescribing opiate medications. No inconsistencies noted.  Meds ordered this encounter  Medications   hydroxyurea (HYDREA) 500 MG capsule    Sig: Take 1 capsule (500 mg total) by mouth daily. May take with food to minimize GI side effects.    Dispense:  90 capsule    Refill:  3    Order Specific Question:   Supervising Provider    Answer:   Quentin Angst [[id number]]   ibuprofen (ADVIL) 800 MG tablet    Sig: Take 1 tablet (800 mg total) by mouth every 8 (eight) hours as needed.    Dispense:  30 tablet    Refill:  4    Order Specific Question:   Supervising Provider    Answer:   Quentin Angst [[id number]]   Oxycodone HCl 10 MG TABS    Sig: Take 1 tablet (10 mg total) by mouth every 6 (six) hours as needed.    Dispense:  60 tablet    Refill:  0    Order Specific Question:   Supervising Provider    Answer:   Quentin Angst [[id number]]   [name]  APRN, MSN, FNP-C Patient Care [facility] [address] [place], [place] [address] [phone]

## 2023-02-27 NOTE — Discharge Summary (Signed)
[facility] Discharge Summary   Patient ID: Jonathan [name] MRN: [id number] DOB/AGE: 18-Jun-2001 21 y.o.  Admit date: 02/27/2023 Discharge date: 02/27/2023  Primary Care Physician:  Quentin Angst, MD  Admission Diagnoses:  Active Problems:   Sickle cell pain crisis [facility])    Discharge Medications:  Allergies as of 02/27/2023       Reactions   Allergy Medication [diphenhydramine] Swelling, Other (See Comments)   Face swells.         Medication List     TAKE these medications    folic acid 1 MG tablet Commonly known as: FOLVITE Take 1 tablet (1 mg total) by mouth daily.   hydroxyurea 500 MG capsule Commonly known as: HYDREA Take 1 capsule (500 mg total) by mouth daily. May take with food to minimize GI side effects.   ibuprofen 800 MG tablet Commonly known as: ADVIL Take 1 tablet (800 mg total) by mouth every 8 (eight) hours as needed.   Oxycodone HCl 10 MG Tabs Take 1 tablet (10 mg total) by mouth every 6 (six) hours as needed.         Consults:  None  Significant Diagnostic Studies:  DG Chest Port 1 View  Result Date: 02/09/2023 CLINICAL DATA:  Sickle cell pain crisis.  Mostly left arm pain. EXAM: PORTABLE CHEST 1 VIEW COMPARISON:  Chest x-ray dated November 30, 2022. FINDINGS: The heart size and mediastinal contours are within normal limits. Normal pulmonary vascularity. Mild chronic scarring in the lingula. No focal consolidation, pleural effusion, or pneumothorax. Diffuse bony sclerosis related to sickle cell disease again noted. IMPRESSION: 1. No active disease. Electronically Signed   By: Obie Dredge M.D.   On: 02/09/2023 09:50   History of Present Illness: Jonathan [name] is a 22 year old male with a medical history significant for sickle cell disease, chronic pain syndrome, opiate dependence, and anemia of chronic disease presents with complaints of lower extremity pain. Patient says that he awakened this am with intense pain to lower  extremities left greater than right. Patient has not identified any inciting factors concerning crisis. He says that he took home oxycodone at that time with minimal relief. He currently rates pain at 7/10, which has improved since admission. He denies fever, chills, chest pain, shortness of breath, nausea, vomiting, or diarrhea. Patient denies any recent travel or sick contacts.     [facility] Course: Patient admitted to sickle cell day infusion clinic for management of pain crisis. Pain managed with IV Dilaudid PCA, IV fluids, IV Toradol, and Tylenol.  Reviewed all patient's laboratory values, largely consistent with his baseline.  Throughout admission, pain intensity decreased to 3/10.  He is requesting discharge home.  Patient has follow-up appointment scheduled on 03/10/2023.  He is alert, oriented, and ambulating without assistance.  Patient will discharge home in a hemodynamically stable condition.  Discharge instructions:  Resume all home medications.   Follow up with PCP as previously  scheduled.   Discussed the importance of drinking 64 ounces of water daily, dehydration of red blood cells may lead further sickling.   Avoid all stressors that precipitate sickle cell pain crisis.     The patient was given clear instructions to go to ER or return to medical center if symptoms do not improve, worsen or new problems develop.   Physical Exam at Discharge:  BP 138/87 (BP Location: Right Arm)   Pulse 74   Temp 98.4 F (36.9 C) (Temporal)   Resp  14   SpO2 91%   Physical Exam Constitutional:      Appearance: Normal appearance.  Eyes:     Pupils: Pupils are equal, round, and reactive to light.  Cardiovascular:     Rate and Rhythm: Normal rate and regular rhythm.     Pulses: Normal pulses.  Pulmonary:     Effort: Pulmonary effort is normal.  Abdominal:     General: Bowel sounds are normal.  Musculoskeletal:        General: Normal range of motion.  Neurological:      General: No focal deficit present.     Mental Status: He is alert. Mental status is at baseline.  Psychiatric:        Mood and Affect: Mood normal.        Behavior: Behavior normal.        Thought Content: Thought content normal.        Judgment: Judgment normal.      Disposition at Discharge: Discharge disposition: 01-Home or Self Care       Discharge Orders: Discharge Instructions     Discharge patient   Complete by: As directed    Discharge disposition: 01-Home or Self Care   Discharge patient date: 02/27/2023       Condition at Discharge:   Stable  Time spent on Discharge:  Greater than 30 minutes.  Signed: [name]  APRN, MSN, FNP-C Patient Care [facility] [address] [place], [place] [address] [phone]  02/27/2023, 2:24 PM  Please note that dragon dictation software was used in the construction of this note.

## 2023-02-27 NOTE — Progress Notes (Signed)
Patient admitted to the day infusion hospital for sickle cell pain crisis. Initially, patient reported right leg pain rated 9/10. For pain management, patient placed on Sickle Cell Dose Dilaudid PCA, given 15 mg Toradol and hydrated with IV fluids. At discharge, patient rated pain at 3/10. Vital signs stable. AVS offered but patient refused. Patient alert, oriented and ambulatory at discharge.

## 2023-02-27 NOTE — Telephone Encounter (Signed)
Patient called in. Complains of pain in R leg rates 7/10, started overnight. Denied chest pain, abd pain, fever, N/V/D, or priapism. Wants to come in for treatment. Last took oxycodone at 4am and ibuprofen at 6am. Pt denies recent ED visits. Pt states his girlfriend is his transportation to and from center today. Pt denies covid exposure or flu like symptoms. [place] FNP made aware, approved pt to be seen in the day hospital today. Pt notified, verbalized understanding.

## 2023-02-27 NOTE — H&P (Addendum)
[facility] History and Physical   Date: 02/27/2023  Patient name: Jonathan [name] Medical record number: [id number] Date of birth: [birth year] Age: 22 y.o. Gender: male PCP: Quentin Angst, MD  Attending physician: Quentin Angst, MD  Chief Complaint: Sickle cell pain   History of Present Illness: Jonathan [name] is a 22 year old male with a medical history significant for sickle cell disease, chronic pain syndrome, opiate dependence, and anemia of chronic disease presents with complaints of lower extremity pain. Patient says that he awakened this am with intense pain to lower extremities left greater than right. Patient has not identified any inciting factors concerning crisis. He says that he took home oxycodone at that time with minimal relief. He currently rates pain at 7/10, which has improved since admission. He denies fever, chills, chest pain, shortness of breath, nausea, vomiting, or diarrhea. Patient denies any recent travel or sick contacts.    Meds: Medications Prior to Admission  Medication Sig Dispense Refill Last Dose   folic acid (FOLVITE) 1 MG tablet Take 1 tablet (1 mg total) by mouth daily. 90 tablet 3 02/27/2023   hydroxyurea (HYDREA) 500 MG capsule Take 1 capsule (500 mg total) by mouth daily. May take with food to minimize GI side effects. 90 capsule 3 02/27/2023   ibuprofen (ADVIL) 800 MG tablet Take 1 tablet (800 mg total) by mouth every 8 (eight) hours as needed. 30 tablet 4 02/27/2023   Oxycodone HCl 10 MG TABS Take 1 tablet (10 mg total) by mouth every 6 (six) hours as needed. 60 tablet 0 02/27/2023    Allergies: Allergy medication [diphenhydramine] Past Medical History:  Diagnosis Date   Acute chest syndrome(517.3) 08/26/2011   march 2013, Sept 2013   Asthma    Avascular necrosis of bone of left hip (HCC)    Sickle cell disease, type SS (HCC)    Past Surgical History:  Procedure Laterality Date   ADENOIDECTOMY     SPLENECTOMY      TONSILLECTOMY     TONSILLECTOMY AND ADENOIDECTOMY     TOTAL HIP ARTHROPLASTY Left 03/07/2016   UMBILICAL HERNIA REPAIR     Family History  Problem Relation Age of Onset   Hypertension Mother    Hypertension Maternal Grandmother    Hypertension Maternal Grandfather    Asthma Paternal Grandfather    Diabetes Paternal Grandfather    Social History   Socioeconomic History   Marital status: Single    Spouse name: Not on file   Number of children: Not on file   Years of education: Not on file   Highest education level: Not on file  Occupational History   Not on file  Tobacco Use   Smoking status: Never   Smokeless tobacco: Never  Vaping Use   Vaping Use: Never used  Substance and Sexual Activity   Alcohol use: No    Alcohol/week: 0.0 standard drinks of alcohol   Drug use: No   Sexual activity: Never  Other Topics Concern   Not on file  Social History Narrative   Lives with mother.  No smokers at home      Update:       05/02/16 Lives with Mother only   Social Determinants of Health   Financial Resource Strain: Not on file  Food Insecurity: No Food Insecurity (08/12/2022)   Hunger Vital Sign    Worried About Running Out of Food in the Last Year: Never true    Ran Out of Food  in the Last Year: Never true  Transportation Needs: Unmet Transportation Needs (08/12/2022)   PRAPARE - Administrator, Civil Service (Medical): Yes    Lack of Transportation (Non-Medical): Yes  Physical Activity: Not on file  Stress: Not on file  Social Connections: Not on file  Intimate Partner Violence: Not At Risk (08/12/2022)   Humiliation, Afraid, Rape, and Kick questionnaire    Fear of Current or Ex-Partner: No    Emotionally Abused: No    Physically Abused: No    Sexually Abused: No  Review of Systems  Constitutional: Negative.   HENT: Negative.    Eyes: Negative.   Respiratory: Negative.    Cardiovascular: Negative.   Gastrointestinal: Negative.   Genitourinary:  Negative.   Musculoskeletal:  Positive for joint pain and myalgias.  Skin: Negative.   Neurological: Negative.   Psychiatric/Behavioral: Negative.       Physical Exam: Blood pressure 138/87, pulse 74, temperature 98.4 F (36.9 C), temperature source Temporal, resp. rate 14, SpO2 91 %. Physical Exam Constitutional:      Appearance: Normal appearance.  Eyes:     Pupils: Pupils are equal, round, and reactive to light.  Cardiovascular:     Rate and Rhythm: Normal rate and regular rhythm.  Pulmonary:     Effort: Pulmonary effort is normal.  Abdominal:     General: Abdomen is flat. Bowel sounds are normal.  Musculoskeletal:        General: Normal range of motion.  Skin:    General: Skin is warm.  Neurological:     General: No focal deficit present.     Mental Status: He is alert. Mental status is at baseline.  Psychiatric:        Mood and Affect: Mood normal.        Behavior: Behavior normal.        Thought Content: Thought content normal.        Judgment: Judgment normal.      Lab results: Results for orders placed or performed during the hospital encounter of 02/27/23 (from the past 24 hour(s))  CBC with Differential/Platelet     Status: Abnormal   Collection Time: 02/27/23  9:11 AM  Result Value Ref Range   WBC 10.6 (H) 4.0 - 10.5 K/uL   RBC 3.72 (L) 4.22 - 5.81 MIL/uL   Hemoglobin 11.2 (L) 13.0 - 17.0 g/dL   HCT 16.1 (L) 09.6 - 04.5 %   MCV 90.3 80.0 - 100.0 fL   MCH 30.1 26.0 - 34.0 pg   MCHC 33.3 30.0 - 36.0 g/dL   RDW 40.9 (H) 81.1 - 91.4 %   Platelets 487 (H) 150 - 400 K/uL   nRBC 0.6 (H) 0.0 - 0.2 %   Neutrophils Relative % 59 %   Neutro Abs 6.3 1.7 - 7.7 K/uL   Lymphocytes Relative 28 %   Lymphs Abs 3.0 0.7 - 4.0 K/uL   Monocytes Relative 9 %   Monocytes Absolute 0.9 0.1 - 1.0 K/uL   Eosinophils Relative 2 %   Eosinophils Absolute 0.3 0.0 - 0.5 K/uL   Basophils Relative 1 %   Basophils Absolute 0.1 0.0 - 0.1 K/uL   Immature Granulocytes 1 %   Abs  Immature Granulocytes 0.05 0.00 - 0.07 K/uL  Comprehensive metabolic panel     Status: Abnormal   Collection Time: 02/27/23  9:11 AM  Result Value Ref Range   Sodium 138 135 - 145 mmol/L   Potassium 3.6 3.5 - 5.1  mmol/L   Chloride 107 98 - 111 mmol/L   CO2 21 (L) 22 - 32 mmol/L   Glucose, Bld 101 (H) 70 - 99 mg/dL   BUN 8 6 - 20 mg/dL   Creatinine, Ser 1.61 0.61 - 1.24 mg/dL   Calcium 9.4 8.9 - 09.6 mg/dL   Total Protein 8.1 6.5 - 8.1 g/dL   Albumin 4.6 3.5 - 5.0 g/dL   AST 21 15 - 41 U/L   ALT 10 0 - 44 U/L   Alkaline Phosphatase 120 38 - 126 U/L   Total Bilirubin 1.8 (H) 0.3 - 1.2 mg/dL   GFR, Estimated >04 >54 mL/min   Anion gap 10 5 - 15  Reticulocytes     Status: Abnormal   Collection Time: 02/27/23  9:11 AM  Result Value Ref Range   Retic Ct Pct 6.8 (H) 0.4 - 3.1 %   RBC. 3.70 (L) 4.22 - 5.81 MIL/uL   Retic Count, Absolute 250.9 (H) 19.0 - 186.0 K/uL   Immature Retic Fract 40.5 (H) 2.3 - 15.9 %    Imaging results:  No results found.   Assessment & Plan: Patient admitted to sickle cell day infusion center for management of pain crisis.  Patient is opiate tolerant Initiate IV dilaudid PCA.  IV fluids, 0.45% saline at 100 ml/hr Toradol 15 mg IV times one dose Tylenol 1000 mg by mouth times one dose Review CBC with differential, complete metabolic panel, and reticulocytes as results become available. Pain intensity will be reevaluated in context of functioning and relationship to baseline as care progresses If pain intensity remains elevated and/or sudden change in hemodynamic stability transition to inpatient services for higher level of care.     [name]  APRN, MSN, FNP-C Patient Care [facility] [address] [place], [place] [address] [phone] 02/27/2023, 2:09 PM

## 2023-02-27 NOTE — Telephone Encounter (Signed)
Caller & Relationship to patient:  MRN #  [id number]   Call Back Number:   Date of Last Office Visit: 02/06/2023     Date of Next Office Visit: 03/03/2023    Medication(s) to be Refilled: Hydroxyurea, Oxycodone, Ibuprofen   Preferred Pharmacy:   ** Please notify patient to allow 48-72 hours to process** **Let patient know to contact pharmacy at the end of the day to make sure medication is ready. ** **If patient has not been seen in a year or longer, book an appointment **Advise to use MyChart for refill requests OR to contact their pharmacy

## 2023-03-03 ENCOUNTER — Ambulatory Visit: Payer: 59 | Admitting: Family Medicine

## 2023-03-03 ENCOUNTER — Non-Acute Institutional Stay (HOSPITAL_COMMUNITY)
Admission: AD | Admit: 2023-03-03 | Discharge: 2023-03-03 | Disposition: A | Discharge status: Home / Self Care | Payer: Medicaid Other | Source: Ambulatory Visit | Attending: Internal Medicine | Admitting: Internal Medicine

## 2023-03-03 VITALS — BP 134/75 | HR 73 | Temp 98.0°F | Ht 65.0 in | Wt 147.2 lb

## 2023-03-03 DIAGNOSIS — D5700 Hb-SS disease with crisis, unspecified: Secondary | ICD-10-CM | POA: Diagnosis not present

## 2023-03-03 DIAGNOSIS — Z23 Encounter for immunization: Secondary | ICD-10-CM | POA: Diagnosis not present

## 2023-03-03 DIAGNOSIS — E559 Vitamin D deficiency, unspecified: Secondary | ICD-10-CM

## 2023-03-03 DIAGNOSIS — Z79891 Long term (current) use of opiate analgesic: Secondary | ICD-10-CM | POA: Diagnosis not present

## 2023-03-03 NOTE — Progress Notes (Signed)
Established Patient Office Visit  Subjective   Patient ID: Jonathan [name], male    DOB: 02-15-2001  Age: 22 y.o. MRN: [id number]  Chief Complaint  Patient presents with   Sickle Cell Anemia    Jonathan [name] is a 22 year old male with a medical history significant for sickle cell disease, chronic pain syndrome, opiate dependence and tolerance, and anemia of chronic disease presents for a follow up of chronic disease. Patient has a primary complaint of lower extremity pain. He rates pain as 5/10. Patient last had oxycodone this am without sustained relief. Patient was last admitted to the sickle cell clinic on 02/27/2023 for sickle cell pain crisis. Patient currently denies fever, chills, chest pain, urinary symptoms, nausea, vomiting, or diarrhea.     Patient Active Problem List   Diagnosis Date Noted   Elevated AST (SGOT) 08/19/2022   Elevated random blood glucose level 08/19/2022   Chronic pain 06/11/2022   Community acquired pneumonia 02/12/2022   Pulmonary nodule 02/12/2022   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HCC) 09/02/2021   Sickle cell crisis (HCC) 07/27/2021   COVID-19 virus infection 07/27/2021   Anemia of chronic disease 06/27/2021   Thrombocytosis 06/27/2021   Mild intermittent asthma without complication 11/11/2020   Sickle cell disease with crisis (HCC) 10/23/2020   Sickle cell pain crisis (HCC) 08/16/2020   Proteinuria 04/14/2020   Influenza 10/13/2017   Sickle cell disease (HCC) 04/27/2017   Failed hearing screening 03/25/2016   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   Myopic astigmatism 03/16/2015   Avascular necrosis of bone of left hip (HCC) 03/24/2013   Chronic obstruct airways disease (HCC) 03/15/2013   Snoring 04/08/2012   Hypertension 11/11/2011   Reactive airway disease 11/09/2011   Sickle cell crisis acute chest syndrome (HCC) 07/30/2011   Past Medical History:  Diagnosis Date   Acute chest syndrome(517.3) 08/26/2011   march 2013, Sept 2013    Asthma    Avascular necrosis of bone of left hip (HCC)    Sickle cell disease, type SS (HCC)    Past Surgical History:  Procedure Laterality Date   ADENOIDECTOMY     SPLENECTOMY     TONSILLECTOMY     TONSILLECTOMY AND ADENOIDECTOMY     TOTAL HIP ARTHROPLASTY Left 03/07/2016   UMBILICAL HERNIA REPAIR     Social History   Tobacco Use   Smoking status: Never   Smokeless tobacco: Never  Vaping Use   Vaping Use: Never used  Substance Use Topics   Alcohol use: No    Alcohol/week: 0.0 standard drinks of alcohol   Drug use: No   Social History   Socioeconomic History   Marital status: Single    Spouse name: Not on file   Number of children: Not on file   Years of education: Not on file   Highest education level: Not on file  Occupational History   Not on file  Tobacco Use   Smoking status: Never   Smokeless tobacco: Never  Vaping Use   Vaping Use: Never used  Substance and Sexual Activity   Alcohol use: No    Alcohol/week: 0.0 standard drinks of alcohol   Drug use: No   Sexual activity: Never  Other Topics Concern   Not on file  Social History Narrative   Lives with mother.  No smokers at home      Update:       05/02/16 Lives with Mother only   Social Determinants of Health  Financial Resource Strain: Not on file  Food Insecurity: No Food Insecurity (08/12/2022)   Hunger Vital Sign    Worried About Running Out of Food in the Last Year: Never true    Ran Out of Food in the Last Year: Never true  Transportation Needs: Unmet Transportation Needs (08/12/2022)   PRAPARE - Administrator, Civil Service (Medical): Yes    Lack of Transportation (Non-Medical): Yes  Physical Activity: Not on file  Stress: Not on file  Social Connections: Not on file  Intimate Partner Violence: Not At Risk (08/12/2022)   Humiliation, Afraid, Rape, and Kick questionnaire    Fear of Current or Ex-Partner: No    Emotionally Abused: No    Physically Abused: No    Sexually  Abused: No   Family Status  Relation Name Status   Mother  (Not Specified)   MGM  (Not Specified)   MGF  (Not Specified)   PGF  (Not Specified)   Family History  Problem Relation Age of Onset   Hypertension Mother    Hypertension Maternal Grandmother    Hypertension Maternal Grandfather    Asthma Paternal Grandfather    Diabetes Paternal Grandfather    Allergies  Allergen Reactions   Allergy Medication [Diphenhydramine] Swelling and Other (See Comments)    Face swells.       Review of Systems  Constitutional: Negative.   HENT: Negative.    Eyes: Negative.   Respiratory: Negative.    Cardiovascular: Negative.   Gastrointestinal: Negative.   Genitourinary: Negative.   Musculoskeletal:  Positive for myalgias.  Skin: Negative.   Neurological: Negative.   Psychiatric/Behavioral: Negative.        Objective:     BP 134/75   Pulse 73   Temp 98 F (36.7 C)   Ht 5\' 5"  (1.651 m)   Wt 147 lb 3.2 oz (66.8 kg)   SpO2 96%   BMI 24.50 kg/m  BP Readings from Last 3 Encounters:  03/03/23 134/75  02/27/23 138/87  02/11/23 125/77   Wt Readings from Last 3 Encounters:  03/03/23 147 lb 3.2 oz (66.8 kg)  02/09/23 150 lb (68 kg)  12/02/22 145 lb 9.6 oz (66 kg)      Physical Exam Eyes:     Pupils: Pupils are equal, round, and reactive to light.  Abdominal:     General: Abdomen is flat. Bowel sounds are normal.  Skin:    General: Skin is warm.  Neurological:     General: No focal deficit present.  Psychiatric:        Mood and Affect: Mood normal.        Behavior: Behavior normal.        Thought Content: Thought content normal.        Judgment: Judgment normal.      No results found for any visits on 03/03/23.  Last CBC Lab Results  Component Value Date   WBC 10.6 (H) 02/27/2023   HGB 11.2 (L) 02/27/2023   HCT 33.6 (L) 02/27/2023   MCV 90.3 02/27/2023   MCH 30.1 02/27/2023   RDW 19.4 (H) 02/27/2023   PLT 487 (H) 02/27/2023   Last metabolic panel Lab  Results  Component Value Date   GLUCOSE 101 (H) 02/27/2023   NA 138 02/27/2023   K 3.6 02/27/2023   CL 107 02/27/2023   CO2 21 (L) 02/27/2023   BUN 8 02/27/2023   CREATININE 0.84 02/27/2023   GFRNONAA >60 02/27/2023   CALCIUM 9.4  02/27/2023   PHOS 4.6 08/12/2022   PROT 8.1 02/27/2023   ALBUMIN 4.6 02/27/2023   LABGLOB 3.1 09/02/2022   AGRATIO 1.3 09/02/2022   BILITOT 1.8 (H) 02/27/2023   ALKPHOS 120 02/27/2023   AST 21 02/27/2023   ALT 10 02/27/2023   ANIONGAP 10 02/27/2023   Last lipids Lab Results  Component Value Date   TRIG 44 10/23/2020   Last hemoglobin A1c No results found for: "HGBA1C" Last thyroid functions No results found for: "TSH", "T3TOTAL", "T4TOTAL", "THYROIDAB" Last vitamin D Lab Results  Component Value Date   VD25OH 15.6 (L) 09/02/2022   Last vitamin B12 and Folate No results found for: "VITAMINB12", "FOLATE"    The ASCVD Risk score (Arnett DK, et al., 2019) failed to calculate for the following reasons:   The 2019 ASCVD risk score is only valid for ages 3 to 20    Assessment & Plan:   Problem List Items Addressed This Visit       Other   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HCC)   Other Visit Diagnoses     Need for Tdap vaccination    -  Primary   Relevant Orders   Tdap vaccine greater than or equal to 7yo IM (Completed)   Vitamin D deficiency       Chronic prescription opiate use          1. 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HCC) Continue hydroxyurea, folic acid, and Adakveo  2. Need for Tdap vaccination  - Tdap vaccine greater than or equal to 7yo IM  3. Vitamin D deficiency Replete vitamin D   4. Chronic prescription opiate use Reviewed PDMP substance reporting system prior to prescribing opiate medications. No inconsistencies noted.    Return in about 3 months (around 06/03/2023) for sickle cell anemia.   [name]  APRN, MSN, FNP-C Patient Care [facility] [address] [place], [place] [address] [phone]

## 2023-03-04 ENCOUNTER — Telehealth (HOSPITAL_COMMUNITY): Payer: Self-pay

## 2023-03-04 ENCOUNTER — Other Ambulatory Visit: Payer: Self-pay

## 2023-03-04 NOTE — Telephone Encounter (Signed)
Spoke with pt about canceling Adakveo appointment scheduled for 7/12 , insurance authorization approval needed prior to infusion. Informed pt that our scheduler Sherron Ales is addressing authorization issue and we will reach out to patient to reschedule once authorization is approved. Pt verbalized understanding.

## 2023-03-05 ENCOUNTER — Telehealth: Payer: Self-pay

## 2023-03-05 NOTE — Telephone Encounter (Signed)
A prior authorization request for Oxycodone has been submitted to insurance today via CoverMyMeds Key: [id number]

## 2023-03-06 ENCOUNTER — Inpatient Hospital Stay (HOSPITAL_COMMUNITY): Admission: RE | Admit: 2023-03-06 | Payer: Medicaid Other | Source: Ambulatory Visit

## 2023-03-10 NOTE — H&P (Deleted)
[facility] History and Physical   Date: 03/10/2023  Patient name: Jonathan [name] Medical record number: [id number] Date of birth: October 10, 2000 Age: 22 y.o. Gender: male PCP: Quentin Angst, MD  Attending physician: No att. providers found  Chief Complaint: Sickle cell pain   History of Present Illness: Jonathan [name] is a 22 year old male with a medical history significant for sickle cell disease, chronic pain syndrome, opiate dependence and tolerance, and anemia of chronic disease presents with complaints of lower extremity pain that is consistent with his previous sickle cell pain crisis. Patient had not identified inciting factors for crisis. He last had oxycodone this am without sustained relief. Patient rates pain as 7/10. He denies fever, chills, chest pain, shortness of breath, or dizziness. No urinary symptoms, nausea, vomiting, or diarrhea. No sick contacts or recent travel.   Meds: No medications prior to admission.    Allergies: Allergy medication [diphenhydramine] Past Medical History:  Diagnosis Date   Acute chest syndrome(517.3) 08/26/2011   march 2013, Sept 2013   Asthma    Avascular necrosis of bone of left hip (HCC)    Sickle cell disease, type SS (HCC)    Past Surgical History:  Procedure Laterality Date   ADENOIDECTOMY     SPLENECTOMY     TONSILLECTOMY     TONSILLECTOMY AND ADENOIDECTOMY     TOTAL HIP ARTHROPLASTY Left 03/07/2016   UMBILICAL HERNIA REPAIR     Family History  Problem Relation Age of Onset   Hypertension Mother    Hypertension Maternal Grandmother    Hypertension Maternal Grandfather    Asthma Paternal Grandfather    Diabetes Paternal Grandfather    Social History   Socioeconomic History   Marital status: Single    Spouse name: Not on file   Number of children: Not on file   Years of education: Not on file   Highest education level: Not on file  Occupational History   Not on file  Tobacco Use   Smoking  status: Never   Smokeless tobacco: Never  Vaping Use   Vaping status: Never Used  Substance and Sexual Activity   Alcohol use: No    Alcohol/week: 0.0 standard drinks of alcohol   Drug use: No   Sexual activity: Never  Other Topics Concern   Not on file  Social History Narrative   Lives with mother.  No smokers at home      Update:       05/02/16 Lives with Mother only   Social Determinants of Health   Financial Resource Strain: Not on file  Food Insecurity: No Food Insecurity (08/12/2022)   Hunger Vital Sign    Worried About Running Out of Food in the Last Year: Never true    Ran Out of Food in the Last Year: Never true  Transportation Needs: Unmet Transportation Needs (08/12/2022)   PRAPARE - Administrator, Civil Service (Medical): Yes    Lack of Transportation (Non-Medical): Yes  Physical Activity: Not on file  Stress: Not on file  Social Connections: Not on file  Intimate Partner Violence: Not At Risk (08/12/2022)   Humiliation, Afraid, Rape, and Kick questionnaire    Fear of Current or Ex-Partner: No    Emotionally Abused: No    Physically Abused: No    Sexually Abused: No   Review of Systems  Constitutional: Negative.   HENT: Negative.    Eyes: Negative.   Respiratory: Negative.    Cardiovascular: Negative.  Gastrointestinal: Negative.   Genitourinary: Negative.   Musculoskeletal:  Positive for back pain and joint pain.  Skin: Negative.   Neurological: Negative.   Psychiatric/Behavioral: Negative.       Physical Exam: There were no vitals taken for this visit. Physical Exam Constitutional:      Appearance: Normal appearance.  Eyes:     Pupils: Pupils are equal, round, and reactive to light.  Cardiovascular:     Rate and Rhythm: Normal rate and regular rhythm.  Pulmonary:     Effort: Pulmonary effort is normal.  Abdominal:     General: Bowel sounds are normal.  Musculoskeletal:        General: Normal range of motion.  Skin:     General: Skin is warm.  Neurological:     General: No focal deficit present.     Mental Status: He is alert. Mental status is at baseline.  Psychiatric:        Mood and Affect: Mood normal.        Behavior: Behavior normal.        Thought Content: Thought content normal.        Judgment: Judgment normal.      Lab results: No results found for this or any previous visit (from the past 24 hour(s)).  Imaging results:  No results found.   Assessment & Plan: Patient admitted to sickle cell day infusion center for management of pain crisis.  Patient is opiate tolerant Initiate IV dilaudid PCA. IV fluids, 0.45% saline at 100 ml/hr Toradol 15 mg IV times one dose Tylenol 1000 mg by mouth times one dose Review CBC with differential, complete metabolic panel, and reticulocytes as results become available.  Pain intensity will be reevaluated in context of functioning and relationship to baseline as care progresses If pain intensity remains elevated and/or sudden change in hemodynamic stability transition to inpatient services for higher level of care.     [name]  APRN, MSN, FNP-C Patient Care [facility] [address] [place], [place] [address] [phone]  03/10/2023, 2:33 PM

## 2023-03-25 ENCOUNTER — Other Ambulatory Visit: Payer: Self-pay | Admitting: Nurse Practitioner

## 2023-03-25 ENCOUNTER — Telehealth: Payer: Self-pay | Admitting: Internal Medicine

## 2023-03-25 DIAGNOSIS — D5700 Hb-SS disease with crisis, unspecified: Secondary | ICD-10-CM

## 2023-03-25 DIAGNOSIS — Z79891 Long term (current) use of opiate analgesic: Secondary | ICD-10-CM

## 2023-03-25 DIAGNOSIS — D571 Sickle-cell disease without crisis: Secondary | ICD-10-CM

## 2023-03-25 MED ORDER — IBUPROFEN 800 MG PO TABS: 800.0000 mg | ORAL_TABLET | Freq: Three times a day (TID) | ORAL | 4 refills | Status: DC | PRN

## 2023-03-25 MED ORDER — HYDROXYUREA 500 MG PO CAPS: 500.0000 mg | ORAL_CAPSULE | Freq: Every day | ORAL | 3 refills | Status: DC

## 2023-03-25 MED ORDER — OXYCODONE HCL 10 MG PO TABS
10.0000 mg | ORAL_TABLET | Freq: Four times a day (QID) | ORAL | 0 refills | Status: AC | PRN
Start: 2023-03-25 — End: 2023-04-09

## 2023-03-25 NOTE — Telephone Encounter (Signed)
Caller & Relationship to patient:  MRN #  [id number]   Call Back Number:   Date of Last Office Visit: 03/03/2023     Date of Next Office Visit: 06/09/2023    Medication(s) to be Refilled: Oxycodone Hydroxyurea and Ibuprofen  Preferred Pharmacy:   ** Please notify patient to allow 48-72 hours to process** **Let patient know to contact pharmacy at the end of the day to make sure medication is ready. ** **If patient has not been seen in a year or longer, book an appointment **Advise to use MyChart for refill requests OR to contact their pharmacy

## 2023-04-13 ENCOUNTER — Telehealth (HOSPITAL_COMMUNITY): Payer: Self-pay | Admitting: *Deleted

## 2023-04-13 ENCOUNTER — Non-Acute Institutional Stay (HOSPITAL_COMMUNITY)
Admission: AD | Admit: 2023-04-13 | Discharge: 2023-04-13 | Disposition: A | Discharge status: Home / Self Care | Payer: 59 | Source: Home / Self Care | Attending: Internal Medicine | Admitting: Internal Medicine

## 2023-04-13 DIAGNOSIS — G894 Chronic pain syndrome: Secondary | ICD-10-CM | POA: Insufficient documentation

## 2023-04-13 DIAGNOSIS — D5700 Hb-SS disease with crisis, unspecified: Secondary | ICD-10-CM | POA: Diagnosis present

## 2023-04-13 DIAGNOSIS — D638 Anemia in other chronic diseases classified elsewhere: Secondary | ICD-10-CM | POA: Diagnosis not present

## 2023-04-13 DIAGNOSIS — Z79891 Long term (current) use of opiate analgesic: Secondary | ICD-10-CM | POA: Insufficient documentation

## 2023-04-13 LAB — COMPREHENSIVE METABOLIC PANEL
ALT: 10 U/L (ref 0–44)
AST: 38 U/L (ref 15–41)
Albumin: 3.8 g/dL (ref 3.5–5.0)
Alkaline Phosphatase: 86 U/L (ref 38–126)
Anion gap: 10 (ref 5–15)
BUN: <5 mg/dL — ABNORMAL LOW (ref 6–20)
CO2: 23 mmol/L (ref 22–32)
Calcium: 8.8 mg/dL — ABNORMAL LOW (ref 8.9–10.3)
Chloride: 106 mmol/L (ref 98–111)
Creatinine, Ser: 0.69 mg/dL (ref 0.61–1.24)
GFR, Estimated: >60 mL/min (ref >60)
Glucose, Bld: 96 mg/dL (ref 70–99)
Potassium: 4.2 mmol/L (ref 3.5–5.1)
Sodium: 139 mmol/L (ref 135–145)
Total Bilirubin: 1.3 mg/dL — ABNORMAL HIGH (ref 0.3–1.2)
Total Protein: 6.8 g/dL (ref 6.5–8.1)

## 2023-04-13 LAB — CBC WITH DIFFERENTIAL/PLATELET
Abs Immature Granulocytes: 0.07 10*3/uL (ref 0.00–0.07)
Basophils Absolute: 0.1 10*3/uL (ref 0.0–0.1)
Basophils Relative: 1 %
Eosinophils Absolute: 0.1 10*3/uL (ref 0.0–0.5)
Eosinophils Relative: 2 %
HCT: 29.8 % — ABNORMAL LOW (ref 39.0–52.0)
Hemoglobin: 10.6 g/dL — ABNORMAL LOW (ref 13.0–17.0)
Immature Granulocytes: 1 %
Lymphocytes Relative: 14 %
Lymphs Abs: 1.3 10*3/uL (ref 0.7–4.0)
MCH: 33.9 pg (ref 26.0–34.0)
MCHC: 35.6 g/dL (ref 30.0–36.0)
MCV: 95.2 fL (ref 80.0–100.0)
Monocytes Absolute: 0.7 10*3/uL (ref 0.1–1.0)
Monocytes Relative: 8 %
Neutro Abs: 6.8 10*3/uL (ref 1.7–7.7)
Neutrophils Relative %: 74 %
Platelets: 564 10*3/uL — ABNORMAL HIGH (ref 150–400)
RBC: 3.13 MIL/uL — ABNORMAL LOW (ref 4.22–5.81)
RDW: 18.8 % — ABNORMAL HIGH (ref 11.5–15.5)
WBC: 9.1 10*3/uL (ref 4.0–10.5)
nRBC: 2 % — ABNORMAL HIGH (ref 0.0–0.2)

## 2023-04-13 LAB — LACTATE DEHYDROGENASE: LDH: 348 U/L — ABNORMAL HIGH (ref 98–192)

## 2023-04-13 MED ORDER — NALOXONE HCL 0.4 MG/ML IJ SOLN: 0.4000 mg | INTRAMUSCULAR | Status: DC | PRN

## 2023-04-13 MED ORDER — DIPHENHYDRAMINE HCL 25 MG PO CAPS: 25.0000 mg | ORAL_CAPSULE | ORAL | Status: DC | PRN

## 2023-04-13 MED ORDER — HYDROMORPHONE 1 MG/ML IV SOLN
INTRAVENOUS | Status: DC
  Administered 2023-04-13: 7.5 mg via INTRAVENOUS
  Administered 2023-04-13: 30 mg via INTRAVENOUS
  Filled 2023-04-13: qty 30

## 2023-04-13 MED ORDER — KETOROLAC TROMETHAMINE 15 MG/ML IJ SOLN
15.0000 mg | Freq: Once | INTRAMUSCULAR | Status: AC
  Administered 2023-04-13: 15 mg via INTRAVENOUS
  Filled 2023-04-13: qty 1

## 2023-04-13 MED ORDER — SODIUM CHLORIDE 0.9% FLUSH: 9.0000 mL | INTRAVENOUS | Status: DC | PRN

## 2023-04-13 MED ORDER — POLYETHYLENE GLYCOL 3350 17 G PO PACK: 17.0000 g | PACK | Freq: Every day | ORAL | Status: DC | PRN

## 2023-04-13 MED ORDER — ONDANSETRON HCL 4 MG/2ML IJ SOLN
4.0000 mg | Freq: Four times a day (QID) | INTRAMUSCULAR | Status: DC | PRN
  Administered 2023-04-13: 4 mg via INTRAVENOUS
  Filled 2023-04-13: qty 2

## 2023-04-13 MED ORDER — ACETAMINOPHEN 500 MG PO TABS
1000.0000 mg | ORAL_TABLET | Freq: Once | ORAL | Status: AC
  Administered 2023-04-13: 1000 mg via ORAL
  Filled 2023-04-13: qty 2

## 2023-04-13 MED ORDER — SODIUM CHLORIDE 0.45 % IV SOLN: INTRAVENOUS | Status: DC

## 2023-04-13 MED ORDER — SENNOSIDES-DOCUSATE SODIUM 8.6-50 MG PO TABS: 1.0000 | ORAL_TABLET | Freq: Two times a day (BID) | ORAL | Status: DC

## 2023-04-13 NOTE — Progress Notes (Signed)
Patient admitted to day hospital today for sickle cell pain treatment. On arrival, pt rates 9/10 pain to right leg and lower back. Pt received Dilaudid PCA, IV Fluids, IV Toradol, and IV Zofran via PIV. Pt also received PO Tylenol. At discharge, pt rates pain 3/10. Pt Is alert, oriented, and ambulatory at discharge.

## 2023-04-13 NOTE — H&P (Signed)
[facility] History and Physical  Jonathan [name] ZOX:[id number] DOB: [birth year] DOA: 04/13/2023  PCP: Quentin Angst, MD   Chief Complaint: Sickle cell pain   HPI: Jonathan [name] is a 22 y.o. male with history of sickle cell disease, chronic pain syndrome, opiate dependence and tolerance, and anemia of chronic disease who came to the day hospital today complaining of generalized body pain that is typical of his sickle cell pain crisis.  He said his pain is about 8/10 and is mostly in his lower extremities, especially on the right and lower back but could feel it all over his body.  He woke up this morning with this intense pain that he characterizes as throbbing and achy and constant.  He took his home pain medications with no sustained relief.  He denies any fever, cough, chest pain, shortness of breath, nausea, vomiting or diarrhea.  No urinary symptoms.  He denies any recent travels, sick contact or known exposure to COVID-19.  Systemic Review: General: The patient denies anorexia, fever, weight loss Cardiac: Denies chest pain, syncope, palpitations, pedal edema  Respiratory: Denies cough, shortness of breath, wheezing GI: Denies severe indigestion/heartburn, abdominal pain, nausea, vomiting, diarrhea and constipation GU: Denies hematuria, incontinence, dysuria  Musculoskeletal: Denies arthritis  Skin: Denies suspicious skin lesions Neurologic: Denies focal weakness or numbness, change in vision  Past Medical History:  Diagnosis Date   Acute chest syndrome(517.3) 08/26/2011   march 2013, Sept 2013   Asthma    Avascular necrosis of bone of left hip (HCC)    Sickle cell disease, type SS (HCC)     Past Surgical History:  Procedure Laterality Date   ADENOIDECTOMY     SPLENECTOMY     TONSILLECTOMY     TONSILLECTOMY AND ADENOIDECTOMY     TOTAL HIP ARTHROPLASTY Left 03/07/2016   UMBILICAL HERNIA REPAIR      Allergies  Allergen Reactions   Allergy  Medication [Diphenhydramine] Swelling and Other (See Comments)    Face swells.     Family History  Problem Relation Age of Onset   Hypertension Mother    Hypertension Maternal Grandmother    Hypertension Maternal Grandfather    Asthma Paternal Grandfather    Diabetes Paternal Grandfather       Prior to Admission medications   Medication Sig Start Date End Date Taking? Authorizing Provider  folic acid (FOLVITE) 1 MG tablet Take 1 tablet (1 mg total) by mouth daily. 12/02/22 12/02/23  Massie Maroon, FNP  hydroxyurea (HYDREA) 500 MG capsule Take 1 capsule (500 mg total) by mouth daily. May take with food to minimize GI side effects. 03/25/23   Donell Beers, FNP  ibuprofen (ADVIL) 800 MG tablet Take 1 tablet (800 mg total) by mouth every 8 (eight) hours as needed. 03/25/23   Donell Beers, FNP     Physical Exam: There were no vitals filed for this visit.  General: Alert, awake, afebrile, anicteric, not in obvious distress HEENT: Normocephalic and Atraumatic, Mucous membranes pink                PERRLA; EOM intact; No scleral icterus,                 Nares: Patent, Oropharynx: Clear, Fair Dentition                 Neck: FROM, no cervical lymphadenopathy, thyromegaly, carotid bruit or JVD;  CHEST WALL: No tenderness  CHEST: Normal respiration, clear to auscultation bilaterally  HEART: Regular rate and rhythm; no murmurs rubs or gallops  BACK: No kyphosis or scoliosis; no CVA tenderness  ABDOMEN: Positive Bowel Sounds, soft, non-tender; no masses, no organomegaly EXTREMITIES: No cyanosis, clubbing, or edema SKIN:  no rash or ulceration  CNS: Alert and Oriented x 4, Nonfocal exam, CN 2-12 intact  Labs on Admission:  Basic Metabolic Panel: No results for input(s): "NA", "K", "CL", "CO2", "GLUCOSE", "BUN", "CREATININE", "CALCIUM", "MG", "PHOS" in the last 168 hours. Liver Function Tests: No results for input(s): "AST", "ALT", "ALKPHOS", "BILITOT", "PROT", "ALBUMIN" in the  last 168 hours. No results for input(s): "LIPASE", "AMYLASE" in the last 168 hours. No results for input(s): "AMMONIA" in the last 168 hours. CBC: No results for input(s): "WBC", "NEUTROABS", "HGB", "HCT", "MCV", "PLT" in the last 168 hours. Cardiac Enzymes: No results for input(s): "CKTOTAL", "CKMB", "CKMBINDEX", "TROPONINI" in the last 168 hours.  BNP (last 3 results) No results for input(s): "BNP" in the last 8760 hours.  ProBNP (last 3 results) No results for input(s): "PROBNP" in the last 8760 hours.  CBG: No results for input(s): "GLUCAP" in the last 168 hours.   Assessment/Plan Principal Problem:   Sickle cell anemia with pain (HCC)  Admits to the Day Hospital for extended observation IVF 0.45% Saline @ 125 mls/hour Weight based Dilaudid PCA started within 30 minutes of admission IV Toradol 15 mg Q 6 H x 1 doses Acetaminophen 1000 mg x 1 dose Labs: CBCD, CMP, Retic Count and LDH Monitor vitals very closely, Re-evaluate pain scale every hour 2 L of Oxygen by Cook Patient will be re-evaluated for pain in the context of function and relationship to baseline as care progresses. If no significant relieve from pain (remains above 5/10) will transfer patient to inpatient services for further evaluation and management  Code Status: Full  Family Communication: None  DVT Prophylaxis: Ambulate as tolerated   Time spent: 35 Minutes  Jeanann Lewandowsky, MD, MHA, FACP, FAAP, CPE  If 7PM-7AM, please contact night-coverage [email or web address] 04/13/2023, 9:50 AM

## 2023-04-13 NOTE — Discharge Summary (Signed)
Physician Discharge Summary  Jonathan [name] ZOX:[id number] DOB: [birth year] DOA: 04/13/2023  PCP: Quentin Angst, MD  Admit date: 04/13/2023  Discharge date: 04/13/2023  Time spent: 30 minutes  Discharge Diagnoses:  Principal Problem:   Sickle cell anemia with pain [facility])   Discharge Condition: Stable  Diet recommendation: Regular  History of present illness:  Jonathan [name] is a 22 y.o. male with history of sickle cell disease, chronic pain syndrome, opiate dependence and tolerance, and anemia of chronic disease who came to the day hospital today complaining of generalized body pain that is typical of his sickle cell pain crisis.  He said his pain is about 8/10 and is mostly in his lower extremities, especially on the right and lower back but could feel it all over his body.  He woke up this morning with this intense pain that he characterizes as throbbing and achy and constant.  He took his home pain medications with no sustained relief.  He denies any fever, cough, chest pain, shortness of breath, nausea, vomiting or diarrhea.  No urinary symptoms.  He denies any recent travels, sick contact or known exposure to COVID-19.  Hospital Course:  Jonathan [name] was admitted to the day hospital with sickle cell painful crisis. Patient was treated with IV fluid, weight based IV Dilaudid PCA, IV Toradol, clinician assisted doses as deemed appropriate, and other adjunct therapies per sickle cell pain management protocol. Jonathan [name] showed significant improvement symptomatically, pain improved from 8/10 to 3/10 at the time of discharge. Patient was discharged home in a hemodynamically stable condition. Jonathan [name] will follow-up at the clinic as previously scheduled, continue with home medications as per prior to admission.  Discharge Instructions We discussed the need for good hydration, monitoring of hydration status, avoidance of heat, cold, stress, and infection triggers. We discussed the need  to be compliant with taking Hydrea and other home medications. Jonathan [name] was reminded of the need to seek medical attention immediately if any symptom of bleeding, anemia, or infection occurs.  Discharge Exam: Vitals:   04/13/23 1405 04/13/23 1428  BP: 133/80   Pulse: 64   Resp: 16   Temp:    SpO2: 99% 98%    General appearance: alert, cooperative and no distress Eyes: conjunctivae/corneas clear. PERRL, EOM's intact. Fundi benign. Neck: no adenopathy, no carotid bruit, no JVD, supple, symmetrical, trachea midline and thyroid not enlarged, symmetric, no tenderness/mass/nodules Back: symmetric, no curvature. ROM normal. No CVA tenderness. Resp: clear to auscultation bilaterally Chest wall: no tenderness Cardio: regular rate and rhythm, S1, S2 normal, no murmur, click, rub or gallop GI: soft, non-tender; bowel sounds normal; no masses,  no organomegaly Extremities: extremities normal, atraumatic, no cyanosis or edema Pulses: 2+ and symmetric Skin: Skin color, texture, turgor normal. No rashes or lesions Neurologic: Grossly normal  Discharge Instructions     Diet - low sodium heart healthy   Complete by: As directed    Increase activity slowly   Complete by: As directed       Allergies as of 04/13/2023       Reactions   Allergy Medication [diphenhydramine] Swelling, Other (See Comments)   Face swells.         Medication List     TAKE these medications    folic acid 1 MG tablet Commonly known as: FOLVITE Take 1 tablet (1 mg total) by mouth daily.   hydroxyurea 500 MG capsule Commonly known as: HYDREA Take 1 capsule (500 mg total) by mouth daily. May  take with food to minimize GI side effects.   ibuprofen 800 MG tablet Commonly known as: ADVIL Take 1 tablet (800 mg total) by mouth every 8 (eight) hours as needed.       Allergies  Allergen Reactions   Allergy Medication [Diphenhydramine] Swelling and Other (See Comments)    Face swells.      Significant  Diagnostic Studies: No results found.  Signed:  Jeanann Lewandowsky MD, MHA, FACP, Constance Goltz, CPE   04/13/2023, 2:31 PM

## 2023-04-13 NOTE — Telephone Encounter (Signed)
Patient called requesting to come to the day hospital for sickle cell pain. Patient reports lower back and right leg pain rated 6/10. Reports taking Oxycodone at 5:00 am and Ibuprofen at 2:00 am. COVID-19 screening done and patient denies all symptoms and exposures. Denies fever, chest pain, nausea, vomiting, diarrhea, abdominal pain and priapism. Admits to having transportation without driving himself. Per patient, his grandmother will be his transportation. Dr. Hyman Hopes notified. Patient can come to the day hospital for pain management. Patient advised and expresses an understanding.

## 2023-04-14 ENCOUNTER — Non-Acute Institutional Stay (HOSPITAL_COMMUNITY)
Admission: AD | Admit: 2023-04-14 | Discharge: 2023-04-14 | Disposition: A | Discharge status: Home / Self Care | Payer: 59 | Source: Ambulatory Visit | Attending: Internal Medicine | Admitting: Internal Medicine

## 2023-04-14 ENCOUNTER — Telehealth (HOSPITAL_COMMUNITY): Payer: Self-pay | Admitting: *Deleted

## 2023-04-14 ENCOUNTER — Telehealth: Payer: Self-pay | Admitting: Internal Medicine

## 2023-04-14 DIAGNOSIS — G894 Chronic pain syndrome: Secondary | ICD-10-CM | POA: Diagnosis not present

## 2023-04-14 DIAGNOSIS — D5700 Hb-SS disease with crisis, unspecified: Secondary | ICD-10-CM | POA: Diagnosis present

## 2023-04-14 DIAGNOSIS — J45909 Unspecified asthma, uncomplicated: Secondary | ICD-10-CM | POA: Diagnosis not present

## 2023-04-14 DIAGNOSIS — D638 Anemia in other chronic diseases classified elsewhere: Secondary | ICD-10-CM | POA: Diagnosis not present

## 2023-04-14 LAB — COMPREHENSIVE METABOLIC PANEL
ALT: 12 U/L (ref 0–44)
AST: 35 U/L (ref 15–41)
Albumin: 4.1 g/dL (ref 3.5–5.0)
Alkaline Phosphatase: 94 U/L (ref 38–126)
Anion gap: 10 (ref 5–15)
BUN: 8 mg/dL (ref 6–20)
CO2: 22 mmol/L (ref 22–32)
Calcium: 8.8 mg/dL — ABNORMAL LOW (ref 8.9–10.3)
Chloride: 103 mmol/L (ref 98–111)
Creatinine, Ser: 0.81 mg/dL (ref 0.61–1.24)
GFR, Estimated: >60 mL/min (ref >60)
Glucose, Bld: 151 mg/dL — ABNORMAL HIGH (ref 70–99)
Potassium: 3.6 mmol/L (ref 3.5–5.1)
Sodium: 135 mmol/L (ref 135–145)
Total Bilirubin: 1.4 mg/dL — ABNORMAL HIGH (ref 0.3–1.2)
Total Protein: 7.3 g/dL (ref 6.5–8.1)

## 2023-04-14 LAB — CBC WITH DIFFERENTIAL/PLATELET
Abs Immature Granulocytes: 0.05 10*3/uL (ref 0.00–0.07)
Basophils Absolute: 0.1 10*3/uL (ref 0.0–0.1)
Basophils Relative: 1 %
Eosinophils Absolute: 0 10*3/uL (ref 0.0–0.5)
Eosinophils Relative: 0 %
HCT: 28.4 % — ABNORMAL LOW (ref 39.0–52.0)
Hemoglobin: 10 g/dL — ABNORMAL LOW (ref 13.0–17.0)
Immature Granulocytes: 1 %
Lymphocytes Relative: 21 %
Lymphs Abs: 1.7 10*3/uL (ref 0.7–4.0)
MCH: 33.3 pg (ref 26.0–34.0)
MCHC: 35.2 g/dL (ref 30.0–36.0)
MCV: 94.7 fL (ref 80.0–100.0)
Monocytes Absolute: 0.7 10*3/uL (ref 0.1–1.0)
Monocytes Relative: 8 %
Neutro Abs: 5.5 10*3/uL (ref 1.7–7.7)
Neutrophils Relative %: 69 %
Platelets: 513 10*3/uL — ABNORMAL HIGH (ref 150–400)
RBC: 3 MIL/uL — ABNORMAL LOW (ref 4.22–5.81)
RDW: 20.5 % — ABNORMAL HIGH (ref 11.5–15.5)
WBC: 8 10*3/uL (ref 4.0–10.5)
nRBC: 2.4 % — ABNORMAL HIGH (ref 0.0–0.2)

## 2023-04-14 LAB — LACTATE DEHYDROGENASE: LDH: 253 U/L — ABNORMAL HIGH (ref 98–192)

## 2023-04-14 MED ORDER — ONDANSETRON HCL 4 MG/2ML IJ SOLN
4.0000 mg | Freq: Four times a day (QID) | INTRAMUSCULAR | Status: DC | PRN
  Administered 2023-04-14: 4 mg via INTRAVENOUS
  Filled 2023-04-14: qty 2

## 2023-04-14 MED ORDER — DIPHENHYDRAMINE HCL 25 MG PO CAPS: 25.0000 mg | ORAL_CAPSULE | ORAL | Status: DC | PRN

## 2023-04-14 MED ORDER — POLYETHYLENE GLYCOL 3350 17 G PO PACK: 17.0000 g | PACK | Freq: Every day | ORAL | Status: DC | PRN

## 2023-04-14 MED ORDER — HYDROMORPHONE 1 MG/ML IV SOLN
INTRAVENOUS | Status: DC
  Administered 2023-04-14: 7 mg via INTRAVENOUS
  Administered 2023-04-14: 30 mg via INTRAVENOUS
  Filled 2023-04-14: qty 30

## 2023-04-14 MED ORDER — SODIUM CHLORIDE 0.9% FLUSH: 9.0000 mL | INTRAVENOUS | Status: DC | PRN

## 2023-04-14 MED ORDER — NALOXONE HCL 0.4 MG/ML IJ SOLN: 0.4000 mg | INTRAMUSCULAR | Status: DC | PRN

## 2023-04-14 MED ORDER — DEXTROSE-SODIUM CHLORIDE 5-0.45 % IV SOLN: INTRAVENOUS | Status: DC

## 2023-04-14 MED ORDER — SENNOSIDES-DOCUSATE SODIUM 8.6-50 MG PO TABS: 1.0000 | ORAL_TABLET | Freq: Two times a day (BID) | ORAL | Status: DC

## 2023-04-14 NOTE — H&P (Signed)
  History and Physical    Patient: Jonathan [name] EPP:[id number] DOB: 2001/01/24 DOA: 04/14/2023 DOS: the patient was seen and examined on 04/14/2023 PCP: Quentin Angst, MD  Patient coming from: {Point_of_Origin:26777}  Chief Complaint: No chief complaint on file.  HPI: Cordai Litterer Kannan is a 22 y.o. male with medical history significant of ***  Review of Systems: {ROS_Text:26778} Past Medical History:  Diagnosis Date   Acute chest syndrome(517.3) 08/26/2011   march 2013, Sept 2013   Asthma    Avascular necrosis of bone of left hip (HCC)    Sickle cell disease, type SS (HCC)    Past Surgical History:  Procedure Laterality Date   ADENOIDECTOMY     SPLENECTOMY     TONSILLECTOMY     TONSILLECTOMY AND ADENOIDECTOMY     TOTAL HIP ARTHROPLASTY Left 03/07/2016   UMBILICAL HERNIA REPAIR     Social History:  reports that he has never smoked. He has never used smokeless tobacco. He reports that he does not drink alcohol and does not use drugs.  Allergies  Allergen Reactions   Allergy Medication [Diphenhydramine] Swelling and Other (See Comments)    Face swells.     Family History  Problem Relation Age of Onset   Hypertension Mother    Hypertension Maternal Grandmother    Hypertension Maternal Grandfather    Asthma Paternal Grandfather    Diabetes Paternal Grandfather     Prior to Admission medications   Medication Sig Start Date End Date Taking? Authorizing Provider  folic acid (FOLVITE) 1 MG tablet Take 1 tablet (1 mg total) by mouth daily. 12/02/22 12/02/23  Massie Maroon, FNP  hydroxyurea (HYDREA) 500 MG capsule Take 1 capsule (500 mg total) by mouth daily. May take with food to minimize GI side effects. 03/25/23   Donell Beers, FNP  ibuprofen (ADVIL) 800 MG tablet Take 1 tablet (800 mg total) by mouth every 8 (eight) hours as needed. 03/25/23   Donell Beers, FNP    Physical Exam: There were no vitals filed for this visit. *** Data Reviewed: {Tip  this will not be part of the note when signed- Document your independent interpretation of telemetry tracing, EKG, lab, Radiology test or any other diagnostic tests. Add any new diagnostic test ordered today. (Optional):26781} {Results:26384}  Assessment and Plan: No notes have been filed under this hospital service. Service: Hospitalist     Advance Care Planning:   Code Status: Prior ***  Consults: ***  Family Communication: ***  Severity of Illness: {Observation/Inpatient:21159}  AuthorLonia Blood, MD 04/14/2023 9:37 AM  For on call review [email or web address].

## 2023-04-14 NOTE — Progress Notes (Signed)
Patient admitted to the day infusion hospital for sickle cell pain. Initially, patient reported right leg pain rated 6/10. For pain management, patient placed on Sickle Cell dose Dilaudid PCA and hydrated with IV fluids. At discharge, patient rated pain at 2/10. Vital signs stable. AVS offered but patient refused. Patient alert, oriented and ambulatory at discharge.

## 2023-04-14 NOTE — Discharge Summary (Signed)
Physician Discharge Summary   Patient: Jonathan [name] MRN: [id number] DOB: 01-05-2001  Admit date:     04/14/2023  Discharge date: {dischdate:26783}  Discharge Physician: Lonia Blood   PCP: Quentin Angst, MD   Recommendations at discharge:  {Tip this will not be part of the note when signed- Example include specific recommendations for outpatient follow-up, pending tests to follow-up on. (Optional):26781}  ***  Discharge Diagnoses: Active Problems:   Sickle cell pain crisis (HCC)  Resolved Problems:   * No resolved hospital problems. [facility] Course: No notes on file  Assessment and Plan: No notes have been filed under this hospital service. Service: Hospitalist     {Tip this will not be part of the note when signed There is no height or weight on file to calculate BMI. , ,  (Optional):26781}  {(NOTE) Pain control PDMP Statment (Optional):26782} Consultants: *** Procedures performed: ***  Disposition: {Plan; Disposition:26390} Diet recommendation:  Discharge Diet Orders (From admission, onward)     Start     Ordered   04/14/23 0000  Diet - low sodium heart healthy        04/14/23 1419           {Diet_Plan:26776} DISCHARGE MEDICATION: Allergies as of 04/14/2023       Reactions   Allergy Medication [diphenhydramine] Swelling, Other (See Comments)   Face swells.         Medication List     TAKE these medications    folic acid 1 MG tablet Commonly known as: FOLVITE Take 1 tablet (1 mg total) by mouth daily.   hydroxyurea 500 MG capsule Commonly known as: HYDREA Take 1 capsule (500 mg total) by mouth daily. May take with food to minimize GI side effects.   ibuprofen 800 MG tablet Commonly known as: ADVIL Take 1 tablet (800 mg total) by mouth every 8 (eight) hours as needed.        Discharge Exam: There were no vitals filed for this visit. ***  Condition at discharge: {DC Condition:26389}  The results of significant  diagnostics from this hospitalization (including imaging, microbiology, ancillary and laboratory) are listed below for reference.   Imaging Studies: No results found.  Microbiology: Results for orders placed or performed during the hospital encounter of 08/11/22  Resp panel by RT-PCR (RSV, Flu A&B, Covid) Anterior Nasal Swab     Status: Abnormal   Collection Time: 08/11/22  8:35 PM   Specimen: Anterior Nasal Swab  Result Value Ref Range Status   SARS Coronavirus 2 by RT PCR NEGATIVE NEGATIVE Final    Comment: (NOTE) SARS-CoV-2 target nucleic acids are NOT DETECTED.  The SARS-CoV-2 RNA is generally detectable in upper respiratory specimens during the acute phase of infection. The lowest concentration of SARS-CoV-2 viral copies this assay can detect is 138 copies/mL. A negative result does not preclude SARS-Cov-2 infection and should not be used as the sole basis for treatment or other patient management decisions. A negative result may occur with  improper specimen collection/handling, submission of specimen other than nasopharyngeal swab, presence of viral mutation(s) within the areas targeted by this assay, and inadequate number of viral copies(<138 copies/mL). A negative result must be combined with clinical observations, patient history, and epidemiological information. The expected result is Negative.  Fact Sheet for Patients:  BloggerCourse.com  Fact Sheet for Healthcare Providers:  SeriousBroker.it  This test is no t yet approved or cleared by the [place] FDA and  has been authorized for detection and/or diagnosis  of SARS-CoV-2 by FDA under an Emergency Use Authorization (EUA). This EUA will remain  in effect (meaning this test can be used) for the duration of the COVID-19 declaration under Section 564(b)(1) of the Act, 21 U.S.C.section 360bbb-3(b)(1), unless the authorization is terminated  or revoked sooner.        Influenza A by PCR POSITIVE (A) NEGATIVE Final   Influenza B by PCR NEGATIVE NEGATIVE Final    Comment: (NOTE) The Xpert Xpress SARS-CoV-2/FLU/RSV plus assay is intended as an aid in the diagnosis of influenza from Nasopharyngeal swab specimens and should not be used as a sole basis for treatment. Nasal washings and aspirates are unacceptable for Xpert Xpress SARS-CoV-2/FLU/RSV testing.  Fact Sheet for Patients: BloggerCourse.com  Fact Sheet for Healthcare Providers: SeriousBroker.it  This test is not yet approved or cleared by the [place] FDA and has been authorized for detection and/or diagnosis of SARS-CoV-2 by FDA under an Emergency Use Authorization (EUA). This EUA will remain in effect (meaning this test can be used) for the duration of the COVID-19 declaration under Section 564(b)(1) of the Act, 21 U.S.C. section 360bbb-3(b)(1), unless the authorization is terminated or revoked.     Resp Syncytial Virus by PCR NEGATIVE NEGATIVE Final    Comment: (NOTE) Fact Sheet for Patients: BloggerCourse.com  Fact Sheet for Healthcare Providers: SeriousBroker.it  This test is not yet approved or cleared by the [place] FDA and has been authorized for detection and/or diagnosis of SARS-CoV-2 by FDA under an Emergency Use Authorization (EUA). This EUA will remain in effect (meaning this test can be used) for the duration of the COVID-19 declaration under Section 564(b)(1) of the Act, 21 U.S.C. section 360bbb-3(b)(1), unless the authorization is terminated or revoked.  Performed at [facility] [address] [place], [place] [address]   MRSA Next Gen by PCR, Nasal     Status: None   Collection Time: 08/13/22  9:48 PM   Specimen: Nasal Swab  Result Value Ref Range Status   MRSA by PCR Next Gen NOT DETECTED NOT DETECTED Final    Comment: (NOTE) The  GeneXpert MRSA Assay (FDA approved for NASAL specimens only), is one component of a comprehensive MRSA colonization surveillance program. It is not intended to diagnose MRSA infection nor to guide or monitor treatment for MRSA infections. Test performance is not FDA approved in patients less than 83 years old. Performed at [facility], [address], [place], [place] [address]     Labs: CBC: Recent Labs  Lab 04/13/23 0950 04/14/23 0940  WBC 9.1 8.0  NEUTROABS 6.8 5.5  HGB 10.6* 10.0*  HCT 29.8* 28.4*  MCV 95.2 94.7  PLT 564* 513*   Basic Metabolic Panel: Recent Labs  Lab 04/13/23 1110 04/14/23 0940  NA 139 135  K 4.2 3.6  CL 106 103  CO2 23 22  GLUCOSE 96 151*  BUN <5* 8  CREATININE 0.69 0.81  CALCIUM 8.8* 8.8*   Liver Function Tests: Recent Labs  Lab 04/13/23 1110 04/14/23 0940  AST 38 35  ALT 10 12  ALKPHOS 86 94  BILITOT 1.3* 1.4*  PROT 6.8 7.3  ALBUMIN 3.8 4.1   CBG: No results for input(s): "GLUCAP" in the last 168 hours.  Discharge time spent: {LESS THAN/GREATER UJWJ:19147} 30 minutes.  SignedLonia Blood, MD [facility] 04/14/2023

## 2023-04-14 NOTE — Telephone Encounter (Signed)
Patient called requesting to come to the day hospital for sickle cell pain. Patient reports right leg pain rated 6/10. Reports taking Oxycodone at 4:00 am and Ibuprofen at 6:00 am. Patient reports that he is now out of his Oxycodone. Patient was treated in the day hospital yesterday for sickle cell pain. Denies fever, chest pain, nausea, vomiting, diarrhea, abdominal pain and priapism. Admits to having transportation without driving himself. Per patient, his mother will be his transportation.  Dr. Mikeal Hawthorne notified. Patient can come to the day hospital for pain management. Patient advised and expresses an understanding.

## 2023-04-14 NOTE — Telephone Encounter (Signed)
Caller & Relationship to patient:  MRN #  [id number]   Call Back Number:   Date of Last Office Visit: 03/25/2023     Date of Next Office Visit: 06/09/2023    Medication(s) to be Refilled: Oxycodone   Preferred Pharmacy:   ** Please notify patient to allow 48-72 hours to process** **Let patient know to contact pharmacy at the end of the day to make sure medication is ready. ** **If patient has not been seen in a year or longer, book an appointment **Advise to use MyChart for refill requests OR to contact their pharmacy

## 2023-04-15 IMAGING — CR DG CHEST 2V
2 series · 2 of 2 positions shown · non-contrast
Comparison: Previous studies including the examination of
04/11/2020

CLINICAL DATA: Chest pain, sickle cell disease

EXAM:
CHEST - 2 VIEW

[w chest lat]
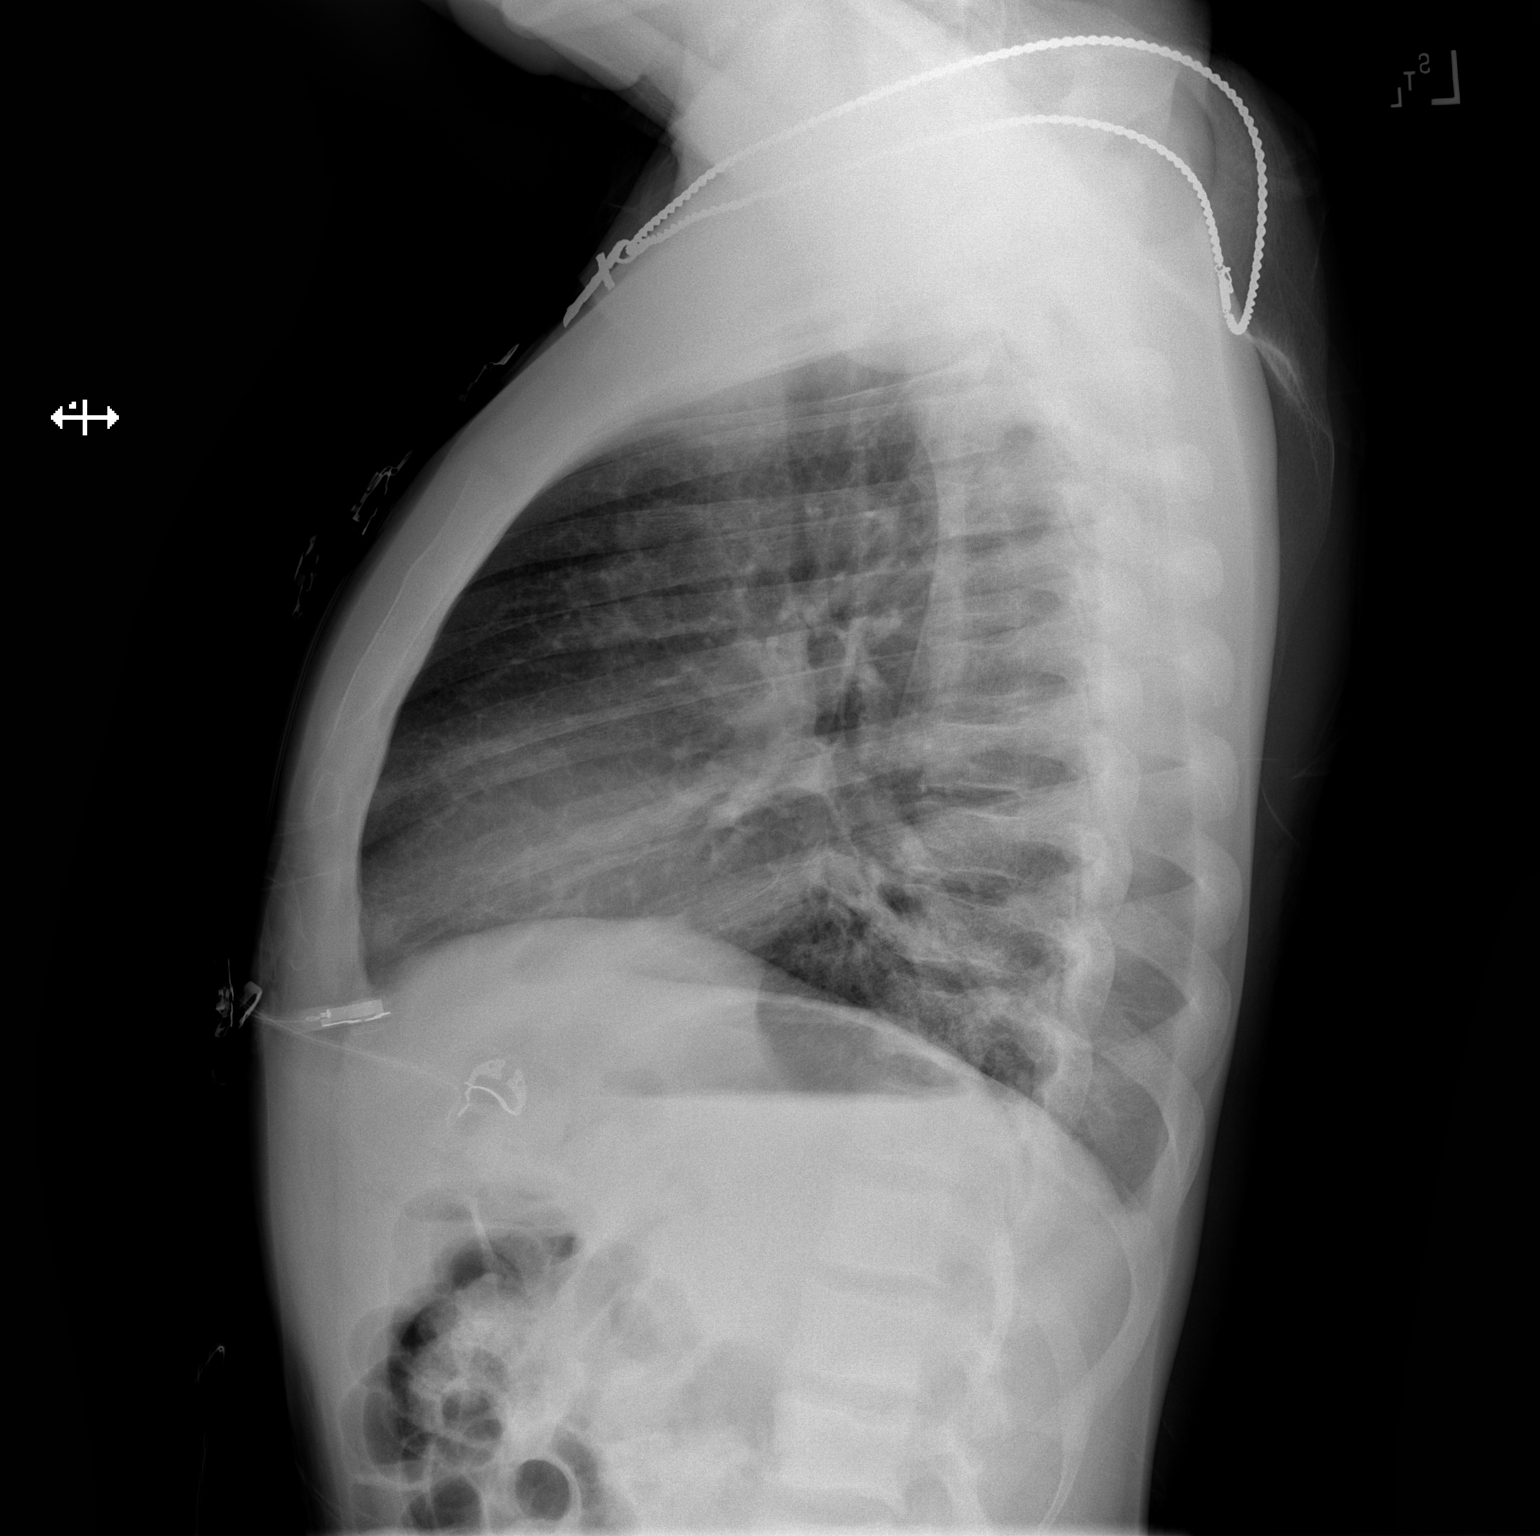

[w chest pa]
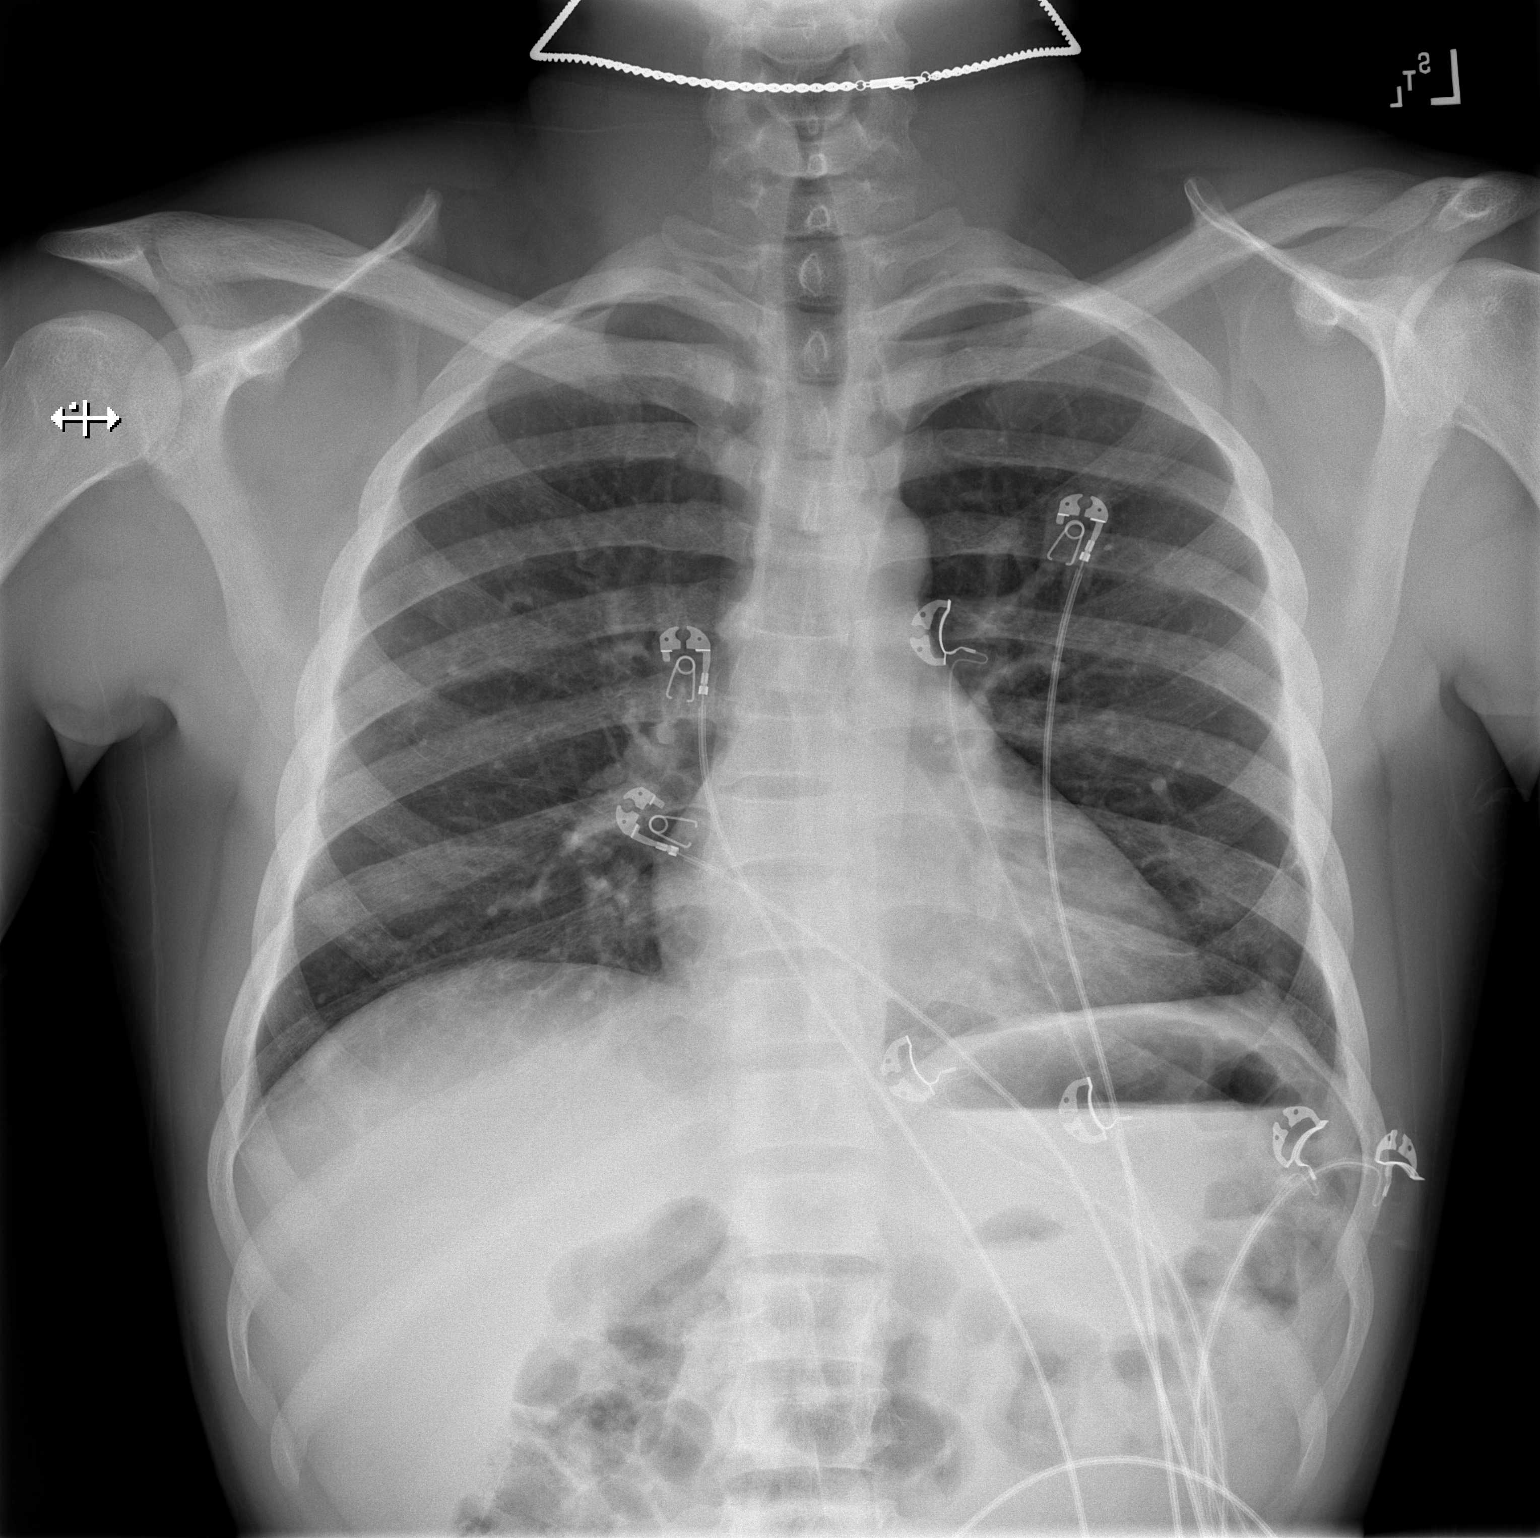

[2 of 2 positions shown; findings below may reference images not displayed]

FINDINGS: Cardiac size is within normal limits. There are no signs of
pulmonary edema. There are no focal pulmonary infiltrates. There is
no significant effusion or pneumothorax.
IMPRESSION: No active cardiopulmonary disease.

## 2023-04-16 NOTE — Hospital Course (Signed)
Patient was admitted with 6 out of 10 pain in his right hip consistent with sickle cell pain crisis.  He was treated with Dilaudid PCA and hydrated.  Patient's pain dropped to 2 out of 10 at discharge.  His vitals were stable.  Pain was controlled and he wanted to be be discharged home to follow-up with PCP.  He was discharged in good health.  Patient to follow-up with his PCP.

## 2023-04-17 NOTE — Telephone Encounter (Signed)
Patient returned phone call for refills Oxycodone, Hydroxyurea and Ibuprofen to be sent to [facility] on [address].

## 2023-04-20 ENCOUNTER — Other Ambulatory Visit: Payer: Self-pay | Admitting: Internal Medicine

## 2023-04-20 DIAGNOSIS — D5700 Hb-SS disease with crisis, unspecified: Secondary | ICD-10-CM

## 2023-04-20 DIAGNOSIS — Z79891 Long term (current) use of opiate analgesic: Secondary | ICD-10-CM

## 2023-04-20 DIAGNOSIS — D571 Sickle-cell disease without crisis: Secondary | ICD-10-CM

## 2023-04-20 MED ORDER — HYDROXYUREA 500 MG PO CAPS
500.0000 mg | ORAL_CAPSULE | Freq: Every day | ORAL | 3 refills | Status: DC
Start: 2023-04-20 — End: 2023-06-22

## 2023-04-20 MED ORDER — IBUPROFEN 800 MG PO TABS
800.0000 mg | ORAL_TABLET | Freq: Three times a day (TID) | ORAL | 4 refills | Status: DC | PRN
Start: 2023-04-20 — End: 2023-06-01

## 2023-04-29 ENCOUNTER — Telehealth: Payer: Self-pay | Admitting: Internal Medicine

## 2023-04-29 ENCOUNTER — Other Ambulatory Visit: Payer: Self-pay | Admitting: Family Medicine

## 2023-04-29 DIAGNOSIS — D571 Sickle-cell disease without crisis: Secondary | ICD-10-CM

## 2023-04-29 DIAGNOSIS — Z79891 Long term (current) use of opiate analgesic: Secondary | ICD-10-CM

## 2023-04-29 MED ORDER — OXYCODONE HCL 10 MG PO TABS
10.0000 mg | ORAL_TABLET | Freq: Four times a day (QID) | ORAL | 0 refills | Status: DC | PRN
Start: 2023-05-02 — End: 2023-06-01

## 2023-04-29 NOTE — Telephone Encounter (Signed)
Pt called requesting Oxycodone refill request and would like to switch his pharmacy to the [facility] on  [address]

## 2023-04-29 NOTE — Progress Notes (Signed)
Reviewed PDMP substance reporting system prior to prescribing opiate medications. No inconsistencies noted.  Meds ordered this encounter  Medications   Oxycodone HCl 10 MG TABS    Sig: Take 1 tablet (10 mg total) by mouth every 6 (six) hours as needed.    Dispense:  60 tablet    Refill:  0    Order Specific Question:   Supervising Provider    Answer:   JEGEDE, OLUGBEMIGA E [[id number]]   Lachina Moore Hollis  APRN, MSN, FNP-C [facility] [address]  [place], [place] [address] [phone]  

## 2023-05-29 ENCOUNTER — Telehealth: Payer: Self-pay | Admitting: Internal Medicine

## 2023-05-29 NOTE — Telephone Encounter (Signed)
Caller & Relationship to patient:  MRN #  [id number]   Call Back Number:   Date of Last Office Visit: 04/29/2023     Date of Next Office Visit: 06/09/2023    Medication(s) to be Refilled: Ibuprofen and Oxycodone   Preferred Pharmacy:   ** Please notify patient to allow 48-72 hours to process** **Let patient know to contact pharmacy at the end of the day to make sure medication is ready. ** **If patient has not been seen in a year or longer, book an appointment **Advise to use MyChart for refill requests OR to contact their pharmacy

## 2023-06-01 ENCOUNTER — Other Ambulatory Visit: Payer: Self-pay | Admitting: Family Medicine

## 2023-06-01 DIAGNOSIS — D5700 Hb-SS disease with crisis, unspecified: Secondary | ICD-10-CM

## 2023-06-01 DIAGNOSIS — D571 Sickle-cell disease without crisis: Secondary | ICD-10-CM

## 2023-06-01 DIAGNOSIS — Z79891 Long term (current) use of opiate analgesic: Secondary | ICD-10-CM

## 2023-06-01 MED ORDER — IBUPROFEN 800 MG PO TABS: 800.0000 mg | ORAL_TABLET | Freq: Three times a day (TID) | ORAL | 4 refills | Status: DC | PRN

## 2023-06-01 MED ORDER — OXYCODONE HCL 10 MG PO TABS
10.0000 mg | ORAL_TABLET | Freq: Four times a day (QID) | ORAL | 0 refills | Status: DC | PRN
Start: 2023-06-01 — End: 2023-06-22

## 2023-06-01 NOTE — Progress Notes (Signed)
Reviewed PDMP substance reporting system prior to prescribing opiate medications. No inconsistencies noted.  Meds ordered this encounter  Medications   ibuprofen (ADVIL) 800 MG tablet    Sig: Take 1 tablet (800 mg total) by mouth every 8 (eight) hours as needed.    Dispense:  30 tablet    Refill:  4    Order Specific Question:   Supervising Provider    Answer:   Quentin Angst [[id number]]   Oxycodone HCl 10 MG TABS    Sig: Take 1 tablet (10 mg total) by mouth every 6 (six) hours as needed.    Dispense:  60 tablet    Refill:  0    Order Specific Question:   Supervising Provider    Answer:   Quentin Angst [[id number]]   [name]  APRN, MSN, FNP-C Patient Care [facility] [address] [place], [place] [address] [phone]

## 2023-06-09 ENCOUNTER — Encounter: Payer: Self-pay | Admitting: Family Medicine

## 2023-06-09 ENCOUNTER — Ambulatory Visit (INDEPENDENT_AMBULATORY_CARE_PROVIDER_SITE_OTHER): Payer: 59 | Admitting: Family Medicine

## 2023-06-09 VITALS — BP 119/67 | HR 84 | Temp 97.6°F | Ht 65.0 in | Wt 153.4 lb

## 2023-06-09 DIAGNOSIS — Z23 Encounter for immunization: Secondary | ICD-10-CM

## 2023-06-09 DIAGNOSIS — D5700 Hb-SS disease with crisis, unspecified: Secondary | ICD-10-CM | POA: Diagnosis not present

## 2023-06-09 DIAGNOSIS — Z79891 Long term (current) use of opiate analgesic: Secondary | ICD-10-CM

## 2023-06-09 DIAGNOSIS — E559 Vitamin D deficiency, unspecified: Secondary | ICD-10-CM

## 2023-06-09 DIAGNOSIS — Z202 Contact with and (suspected) exposure to infections with a predominantly sexual mode of transmission: Secondary | ICD-10-CM | POA: Diagnosis not present

## 2023-06-09 MED ORDER — CEFTRIAXONE SODIUM 250 MG IJ SOLR
250.0000 mg | Freq: Once | INTRAMUSCULAR | Status: AC
Start: 2023-06-09 — End: 2023-06-09
  Administered 2023-06-09: 250 mg via INTRAMUSCULAR

## 2023-06-09 MED ORDER — AZITHROMYCIN 500 MG PO TABS: 1000.0000 mg | ORAL_TABLET | Freq: Once | ORAL | 0 refills | Status: AC

## 2023-06-09 NOTE — Progress Notes (Signed)
Established Patient Office Visit  Subjective   Patient ID: Jonathan [name], male    DOB: 11/10/2000  Age: 22 y.o. MRN: [id number]  Chief Complaint  Patient presents with   Sickle Cell Anemia    Jonathan [name] is a 22 year old male with a medical history significant for sickle cell disease, chronic pain syndrome, opiate dependence and tolerance, mild intermittent asthma, and anemia of chronic disease that presents for follow-up of chronic conditions.  Patient has a primary complaint of penile discharge that is green in color.  He states that he was exposed to Neisseria gonorrhea per his recent partner.  Patient has not been treated for this problem.  He has not been using barrier protection. He denies any fever, no pain, or penile pain. Patient also has a history of sickle cell disease with chronic pain syndrome.  Pain has been well-controlled on oxycodone.  He is not having any pain at this time.  He is not up-to-date with his eye exam.  Patient is not followed by hematology at this time.    Patient Active Problem List   Diagnosis Date Noted   Sickle cell anemia with pain (HCC) 04/13/2023   Elevated AST (SGOT) 08/19/2022   Elevated random blood glucose level 08/19/2022   Chronic pain 06/11/2022   Community acquired pneumonia 02/12/2022   Pulmonary nodule 02/12/2022   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HCC) 09/02/2021   Sickle cell crisis (HCC) 07/27/2021   COVID-19 virus infection 07/27/2021   Anemia of chronic disease 06/27/2021   Thrombocytosis 06/27/2021   Mild intermittent asthma without complication 11/11/2020   Sickle cell disease with crisis (HCC) 10/23/2020   Sickle cell pain crisis (HCC) 08/16/2020   Proteinuria 04/14/2020   Influenza 10/13/2017   Sickle cell disease (HCC) 04/27/2017   Failed hearing screening 03/25/2016   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   Myopic astigmatism 03/16/2015   Avascular necrosis of bone of left hip (HCC) 03/24/2013   Chronic  obstruct airways disease (HCC) 03/15/2013   Snoring 04/08/2012   Hypertension 11/11/2011   Reactive airway disease 11/09/2011   Sickle cell crisis acute chest syndrome (HCC) 07/30/2011   Past Medical History:  Diagnosis Date   Acute chest syndrome(517.3) 08/26/2011   march 2013, Sept 2013   Asthma    Avascular necrosis of bone of left hip (HCC)    Sickle cell disease, type SS (HCC)    Past Surgical History:  Procedure Laterality Date   ADENOIDECTOMY     SPLENECTOMY     TONSILLECTOMY     TONSILLECTOMY AND ADENOIDECTOMY     TOTAL HIP ARTHROPLASTY Left 03/07/2016   UMBILICAL HERNIA REPAIR     Social History   Tobacco Use   Smoking status: Never   Smokeless tobacco: Never  Vaping Use   Vaping status: Never Used  Substance Use Topics   Alcohol use: No    Alcohol/week: 0.0 standard drinks of alcohol   Drug use: No   Social History   Socioeconomic History   Marital status: Single    Spouse name: Not on file   Number of children: Not on file   Years of education: Not on file   Highest education level: Not on file  Occupational History   Not on file  Tobacco Use   Smoking status: Never   Smokeless tobacco: Never  Vaping Use   Vaping status: Never Used  Substance and Sexual Activity   Alcohol use: No    Alcohol/week: 0.0 standard drinks of  alcohol   Drug use: No   Sexual activity: Never  Other Topics Concern   Not on file  Social History Narrative   Lives with mother.  No smokers at home      Update:       05/02/16 Lives with Mother only   Social Determinants of Health   Financial Resource Strain: Not on file  Food Insecurity: No Food Insecurity (08/12/2022)   Hunger Vital Sign    Worried About Running Out of Food in the Last Year: Never true    Ran Out of Food in the Last Year: Never true  Transportation Needs: Unmet Transportation Needs (08/12/2022)   PRAPARE - Administrator, Civil Service (Medical): Yes    Lack of Transportation  (Non-Medical): Yes  Physical Activity: Not on file  Stress: Not on file  Social Connections: Not on file  Intimate Partner Violence: Not At Risk (08/12/2022)   Humiliation, Afraid, Rape, and Kick questionnaire    Fear of Current or Ex-Partner: No    Emotionally Abused: No    Physically Abused: No    Sexually Abused: No   Family Status  Relation Name Status   Mother  (Not Specified)   MGM  (Not Specified)   MGF  (Not Specified)   PGF  (Not Specified)  No partnership data on file   Family History  Problem Relation Age of Onset   Hypertension Mother    Hypertension Maternal Grandmother    Hypertension Maternal Grandfather    Asthma Paternal Grandfather    Diabetes Paternal Grandfather    Allergies  Allergen Reactions   Allergy Medication [Diphenhydramine] Swelling and Other (See Comments)    Face swells.       Review of Systems  Constitutional: Negative.   HENT: Negative.    Respiratory: Negative.    Cardiovascular: Negative.   Gastrointestinal:  Negative for nausea and vomiting.  Genitourinary:  Negative for dysuria, flank pain, hematuria and urgency.  Musculoskeletal: Negative.   Skin: Negative.   Neurological: Negative.   Endo/Heme/Allergies: Negative.   Psychiatric/Behavioral: Negative.        Objective:     BP 119/67   Pulse 84   Temp 97.6 F (36.4 C)   Ht 5\' 5"  (1.651 m)   Wt 153 lb 6.4 oz (69.6 kg)   SpO2 100%   BMI 25.53 kg/m  BP Readings from Last 3 Encounters:  06/09/23 119/67  04/14/23 135/77  04/13/23 133/80   Wt Readings from Last 3 Encounters:  06/09/23 153 lb 6.4 oz (69.6 kg)  03/03/23 147 lb 3.2 oz (66.8 kg)  02/09/23 150 lb (68 kg)      Physical Exam Constitutional:      Appearance: Normal appearance.  Eyes:     Pupils: Pupils are equal, round, and reactive to light.  Cardiovascular:     Rate and Rhythm: Normal rate and regular rhythm.  Pulmonary:     Effort: Pulmonary effort is normal.  Abdominal:     General: Bowel  sounds are normal.  Skin:    General: Skin is warm.  Neurological:     General: No focal deficit present.     Mental Status: He is alert. Mental status is at baseline.  Psychiatric:        Mood and Affect: Mood normal.        Behavior: Behavior normal.        Thought Content: Thought content normal.        Judgment: Judgment  normal.      No results found for any visits on 06/09/23.  Last CBC Lab Results  Component Value Date   WBC 8.0 04/14/2023   HGB 10.0 (L) 04/14/2023   HCT 28.4 (L) 04/14/2023   MCV 94.7 04/14/2023   MCH 33.3 04/14/2023   RDW 20.5 (H) 04/14/2023   PLT 513 (H) 04/14/2023   Last metabolic panel Lab Results  Component Value Date   GLUCOSE 151 (H) 04/14/2023   NA 135 04/14/2023   K 3.6 04/14/2023   CL 103 04/14/2023   CO2 22 04/14/2023   BUN 8 04/14/2023   CREATININE 0.81 04/14/2023   GFRNONAA >60 04/14/2023   CALCIUM 8.8 (L) 04/14/2023   PHOS 4.6 08/12/2022   PROT 7.3 04/14/2023   ALBUMIN 4.1 04/14/2023   LABGLOB 3.1 09/02/2022   AGRATIO 1.3 09/02/2022   BILITOT 1.4 (H) 04/14/2023   ALKPHOS 94 04/14/2023   AST 35 04/14/2023   ALT 12 04/14/2023   ANIONGAP 10 04/14/2023   Last lipids Lab Results  Component Value Date   TRIG 44 10/23/2020   Last hemoglobin A1c No results found for: "HGBA1C" Last thyroid functions No results found for: "TSH", "T3TOTAL", "T4TOTAL", "THYROIDAB" Last vitamin D Lab Results  Component Value Date   VD25OH 15.6 (L) 09/02/2022   Last vitamin B12 and Folate No results found for: "VITAMINB12", "FOLATE"    The ASCVD Risk score (Arnett DK, et al., 2019) failed to calculate for the following reasons:   The 2019 ASCVD risk score is only valid for ages 25 to 68    Assessment & Plan:   Problem List Items Addressed This Visit       Other   Sickle cell anemia with pain (HCC) - Primary   Relevant Orders   [id number] 11+Oxyco+Alc+Crt-Bund   Sickle Cell Panel   Other Visit Diagnoses     Vitamin D deficiency        Relevant Orders   Sickle Cell Panel   Chronic prescription opiate use       Relevant Orders   [id number] 11+Oxyco+Alc+Crt-Bund   Flu vaccine need       Relevant Orders   Flu vaccine trivalent PF, 6mos and older(Flulaval,Afluria,Fluarix,Fluzone) (Completed)   Possible exposure to STI       Relevant Medications   cefTRIAXone (ROCEPHIN) injection 250 mg (Completed)   Other Relevant Orders   RPR+HIV+GC+CT Panel      1. Sickle cell anemia with pain (HCC) Continue folic acid and hydroxyurea Continue regular hydration with up to 64 ounces of water per day. - [id number] 11+Oxyco+Alc+Crt-Bund - Sickle Cell Panel  2. Vitamin D deficiency  - Sickle Cell Panel  3. Chronic prescription opiate use  - [id number] 11+Oxyco+Alc+Crt-Bund  4. Flu vaccine need  - Flu vaccine trivalent PF, 6mos and older(Flulaval,Afluria,Fluarix,Fluzone)  5. Possible exposure to STI Patient exposed to STI, ceftriaxone 250 mg x 1.  Also azithromycin 1000 mg was sent to patient's pharmacy.  Will review laboratory results as they become available.  Patient counseled at length about the importance of barrier protection with sexual intercourse. - RPR+HIV+GC+CT Panel - cefTRIAXone (ROCEPHIN) injection 250 mg   Follow-up in 3 months is scheduled  [name]  APRN, MSN, FNP-C [facility] [address] [place], [place] [address] [phone]

## 2023-06-09 NOTE — Patient Instructions (Signed)
Living With Sickle Cell Disease Living with a long-term condition, such as sickle cell disease, can be a challenge. It can affect both your physical and mental health. You may not have total control over your condition. But proper care and treatment can help manage the effects of the disease so you can feel good and lead an active life. You can take steps to manage your condition and stay as healthy as possible. How does sickle cell disease affect me? Sickle cell disease can cause challenges that affect your quality of life. You may get sick more often as a result of organ damage and infections. Sometimes you may need to stay in the hospital. Learn how to recognize that you are not feeling well and that you may be getting sick. What actions can I take to manage my condition?  The goals of treatment are to control your symptoms and prevent and treat problems. Work with your health care provider to create a treatment plan that works for you. Taking an active role in managing your condition can help you feel more in control of your situation. Ask about possible side effects of medicines that your health care provider recommends. Discuss how you feel about having those side effects. Keeping a healthy lifestyle can help you manage your condition. This includes eating a healthy diet, getting enough sleep, and getting regular exercise. Sickle cell disease may affect your ability to take care of your basic needs. Tell your health care provider if you have concerns about any of these needs: Access to food. Housing. Safe drinking water and other utilities. Safety in your home and community. Work or school. Transportation. Paying for health care. Your health care provider may be able to connect you with community resources that can help you. How to manage stress  Living with sickle cell disease can be stressful. This disease can have a big impact on your mental health. Talk with your health care provider  about ways to reduce your stress or if you have concerns about your mental health.  To cope with stress, try: Keeping a stress diary. This can help you learn what causes your stress to start (figure out your triggers) and how to control your response to those triggers. Spending time doing things that you enjoy, such as: Hobbies. Being outdoors. Spending time with friends and people who make you laugh. Doing yoga, muscle relaxation, deep breathing, or mindfulness practices. Expressing yourself through journal writing, art, crafting, poetry, or playing music. Staying positive about your health. Try to accept that you cannot control your condition perfectly. Follow these instructions at home: Medicines Take over-the-counter and prescription medicines only as told by your health care provider. If you were prescribed antibiotics, take them as told by your health care provider. Do not stop taking them even if you start to feel better. If you develop a fever, do not take medicines to reduce the fever right away. This could cover up another problem. Contact your health care provider. Eating and drinking Drink enough fluid to keep your urine pale yellow. Drink more in hot weather and during exercise. Limit or avoid drinking alcohol. Eat a balanced and nutritious diet. Eat plenty of fruits, vegetables, whole grains, and lean protein. Take vitamins and supplements as told by your health care provider. Traveling When traveling, keep these with you: Your medical information. The names of your health care providers. Your medicines. If you have to travel by air, ask about precautions you should take. Managing pain Work with  your health care provider to create a pain management plan that works for you. The plan may include: Ways to reduce or manage your pain at home, such as: Using a heating pad. Taking a warm bath. Using healthy ways to distract you from the pain, such as hobbies or  reading. Practicing ways to relax, such as doing yoga or listening to music. Getting massages. Doing exercises or stretches as told by a physical therapist. Tracking how pain affects your daily life functions. When to seek help. Who to contact and what to do in case of a pain emergency. General instructions Do not use any products that contain nicotine or tobacco. These products include cigarettes, chewing tobacco, and vaping devices, such as e-cigarettes. These lower blood oxygen levels. If you need help quitting, ask your health care provider. Consider wearing a medical alert bracelet. Use an app or journal to track your symptoms, assess your level of pain and fatigue, and keep track of your medicines. Avoid the following: High altitudes. Very high or low temperatures and big changes in temperature. Activities that will lower your oxygen levels, such as mountain climbing or doing exercise that takes a lot of effort. Stay up to date on: Your treatment plan. Learn as much as you can about your condition. Health screenings. This will help prevent problems or catch them early on. Vaccines. This will help prevent infection. Wash your hands often with soap and water to help prevent infections. Wash them for at least 20 seconds each time. Keep all follow-up visits. Regular follow-up with your health care provider can help you better manage your condition. Where to find support You can find help and support through: Talking with a therapist or taking part in support groups. Sickle Cell Disease Foundation of [place]: www.sicklecelldisease.org Where to find more information Centers for Disease Control and Prevention: FootballExhibition.com.br American Society of Hematology: www.hematology.org Contact a health care provider if: Your symptoms get worse. You have new symptoms. You have a fever. Get help right away if: You have a painful erection of the penis that lasts a long time (priapism). You become  short of breath or are having trouble breathing. You have pain that cannot be controlled with medicine. You have any signs of a stroke. "BE FAST" is an easy way to remember the main warning signs: B - Balance. Dizziness, sudden trouble walking, or loss of balance. E - Eyes. Trouble seeing or a change in how you see. F - Face. Sudden weakness or loss of feeling of the face. The face or eyelid may droop on one side. A - Arms. Weakness or loss of feeling in an arm. This happens all of a sudden and most often on one side of the body. S - Speech. Sudden trouble speaking, slurred speech, or trouble understanding what people say. T - Time. Time to call emergency services. Write down what time symptoms started. You have other signs of a stroke, such as: A sudden, very bad headache with no known cause. Feeling like you may vomit (nausea). Vomiting. Seizure. These symptoms may be an emergency. Get help right away. Call 911. Do not wait to see if the symptoms will go away. Do not drive yourself to the hospital. Also, get help right away if: You have strong feelings of sadness or loss of hope, or you have thoughts about hurting yourself or others. Take one of these steps if you feel like you may hurt yourself or others, or have thoughts about taking your own life:  Go to your nearest emergency room. Call 911. Call the National Suicide Prevention Lifeline at 417-072-3984 or 988. This is open 24 hours a day. Text the Crisis Text Line at 8488243260. Summary Proper care and treatment can help manage the effects of sickle cell disease so you can feel good and lead an active life. The goals of treatment are to control your symptoms and prevent and treat problems. Taking an active role in managing your condition can help you feel more in control of your situation. Work with your health care provider to create a pain management plan that works for you. Get medical help right away as told by your health care  provider. This information is not intended to replace advice given to you by your health care provider. Make sure you discuss any questions you have with your health care provider. Document Revised: 11/18/2021 Document Reviewed: 11/18/2021 Elsevier Patient Education  2024 [employer].

## 2023-06-13 LAB — CANNABINOID CONFIRMATION, UR
CANNABINOIDS: POSITIVE — AB
Carboxy THC GC/MS Conf: 247 ng/mL

## 2023-06-13 LAB — OXYCODONE/OXYMORPHONE, CONFIRM
OXYCODONE/OXYMORPH: POSITIVE — AB
OXYCODONE: NEGATIVE
OXYMORPHONE (GC/MS): 364 ng/mL
OXYMORPHONE: POSITIVE — AB

## 2023-06-13 LAB — DRUG SCREEN 764883 11+OXYCO+ALC+CRT-BUND
Amphetamines, Urine: NEGATIVE ng/mL
BENZODIAZ UR QL: NEGATIVE ng/mL
Barbiturate: NEGATIVE ng/mL
Cocaine (Metabolite): NEGATIVE ng/mL
Creatinine: 126.7 mg/dL (ref 20.0–300.0)
Ethanol: NEGATIVE %
Meperidine: NEGATIVE ng/mL
Methadone Screen, Urine: NEGATIVE ng/mL
OPIATE SCREEN URINE: NEGATIVE ng/mL
Phencyclidine: NEGATIVE ng/mL
Propoxyphene: NEGATIVE ng/mL
Tramadol: NEGATIVE ng/mL
pH, Urine: 6.2 (ref 4.5–8.9)

## 2023-06-15 ENCOUNTER — Telehealth (HOSPITAL_COMMUNITY): Payer: Self-pay | Admitting: *Deleted

## 2023-06-15 ENCOUNTER — Non-Acute Institutional Stay (HOSPITAL_COMMUNITY)
Admission: AD | Admit: 2023-06-15 | Discharge: 2023-06-15 | Disposition: A | Discharge status: Home / Self Care | Payer: 59 | Source: Ambulatory Visit | Attending: Internal Medicine | Admitting: Internal Medicine

## 2023-06-15 DIAGNOSIS — G894 Chronic pain syndrome: Secondary | ICD-10-CM | POA: Diagnosis not present

## 2023-06-15 DIAGNOSIS — J45909 Unspecified asthma, uncomplicated: Secondary | ICD-10-CM | POA: Insufficient documentation

## 2023-06-15 DIAGNOSIS — D5700 Hb-SS disease with crisis, unspecified: Secondary | ICD-10-CM | POA: Diagnosis not present

## 2023-06-15 DIAGNOSIS — Z833 Family history of diabetes mellitus: Secondary | ICD-10-CM | POA: Insufficient documentation

## 2023-06-15 DIAGNOSIS — D638 Anemia in other chronic diseases classified elsewhere: Secondary | ICD-10-CM | POA: Diagnosis not present

## 2023-06-15 LAB — CMP14+CBC/D/PLT+FER+RETIC+V...
ALT: 10 [IU]/L (ref 0–44)
AST: 34 [IU]/L (ref 0–40)
Albumin: 4.7 g/dL (ref 4.3–5.2)
Alkaline Phosphatase: 137 [IU]/L — ABNORMAL HIGH (ref 44–121)
BUN/Creatinine Ratio: 8 — ABNORMAL LOW (ref 9–20)
BUN: 7 mg/dL (ref 6–20)
Bilirubin Total: 1.3 mg/dL — ABNORMAL HIGH (ref 0.0–1.2)
CO2: 16 mmol/L — ABNORMAL LOW (ref 20–29)
Calcium: 9.9 mg/dL (ref 8.7–10.2)
Chloride: 104 mmol/L (ref 96–106)
Creatinine, Ser: 0.9 mg/dL (ref 0.76–1.27)
Ferritin: 689 ng/mL — ABNORMAL HIGH (ref 30–400)
Globulin, Total: 2.8 g/dL (ref 1.5–4.5)
Glucose: 100 mg/dL — ABNORMAL HIGH (ref 70–99)
Potassium: 5.4 mmol/L — ABNORMAL HIGH (ref 3.5–5.2)
Sodium: 140 mmol/L (ref 134–144)
Total Protein: 7.5 g/dL (ref 6.0–8.5)
eGFR: 123 mL/min/{1.73_m2} (ref >59)

## 2023-06-15 LAB — RPR+HIV+GC+CT PANEL
Chlamydia trachomatis, NAA: POSITIVE — AB
Neisseria Gonorrhoeae by PCR: NEGATIVE

## 2023-06-15 LAB — LACTATE DEHYDROGENASE: LDH: 236 U/L — ABNORMAL HIGH (ref 98–192)

## 2023-06-15 MED ORDER — POLYETHYLENE GLYCOL 3350 17 G PO PACK: 17.0000 g | PACK | Freq: Every day | ORAL | Status: DC | PRN

## 2023-06-15 MED ORDER — SENNOSIDES-DOCUSATE SODIUM 8.6-50 MG PO TABS: 1.0000 | ORAL_TABLET | Freq: Two times a day (BID) | ORAL | Status: DC

## 2023-06-15 MED ORDER — DIPHENHYDRAMINE HCL 25 MG PO CAPS: 25.0000 mg | ORAL_CAPSULE | ORAL | Status: DC | PRN

## 2023-06-15 MED ORDER — HYDROMORPHONE 1 MG/ML IV SOLN
INTRAVENOUS | Status: DC
  Administered 2023-06-15: 30 mg via INTRAVENOUS
  Administered 2023-06-15: 6 mg via INTRAVENOUS
  Filled 2023-06-15: qty 30

## 2023-06-15 MED ORDER — DEXTROSE-SODIUM CHLORIDE 5-0.45 % IV SOLN: INTRAVENOUS | Status: DC

## 2023-06-15 MED ORDER — KETOROLAC TROMETHAMINE 15 MG/ML IJ SOLN
15.0000 mg | Freq: Four times a day (QID) | INTRAMUSCULAR | Status: DC
  Administered 2023-06-15: 15 mg via INTRAVENOUS
  Filled 2023-06-15: qty 1

## 2023-06-15 MED ORDER — SODIUM CHLORIDE 0.9% FLUSH: 9.0000 mL | INTRAVENOUS | Status: DC | PRN

## 2023-06-15 MED ORDER — NALOXONE HCL 0.4 MG/ML IJ SOLN: 0.4000 mg | INTRAMUSCULAR | Status: DC | PRN

## 2023-06-15 MED ORDER — ONDANSETRON HCL 4 MG/2ML IJ SOLN
4.0000 mg | Freq: Four times a day (QID) | INTRAMUSCULAR | Status: DC | PRN
  Administered 2023-06-15: 4 mg via INTRAVENOUS
  Filled 2023-06-15: qty 2

## 2023-06-15 NOTE — Telephone Encounter (Signed)
Patient called requesting to come to the day hospital for sickle cell pain. Patient reports back pain rated 7/10. Reports taking Ibuprofen and Tylenol at 5:00 am. Patient ran out of his Oxycodone and sent in a request over the weekend. COVID-19 screening done and patient denies all symptoms and exposures. Denies fever, chest pain, nausea, vomiting, diarrhea, abdominal pain and priapism. Admits to having transportation without driving self. Dr. Mikeal Hawthorne notified. Patient can come to the day hospital for sickle cell pain. Patient advised and expresses an understanding.

## 2023-06-15 NOTE — Discharge Summary (Signed)
Physician Discharge Summary   Patient: Jonathan [name] MRN: [id number] DOB: Dec 25, 2000  Admit date:     06/15/2023  Discharge date: 06/15/2023  Discharge Physician: Lonia Blood   PCP: Quentin Angst, MD   Recommendations at discharge:   Patient to resume home regimen.  She is out of his oxycodone.  He has requested for refills.  Discharge Diagnoses: Active Problems:   Sickle cell pain crisis (HCC)  Resolved Problems:   * No resolved hospital problems. [facility] Course: No notes on file  Assessment and Plan: No notes have been filed under this hospital service. Service: Hospitalist        Consultants: None Procedures performed: None Disposition: Home Diet recommendation:  Discharge Diet Orders (From admission, onward)     Start     Ordered   06/15/23 0000  Diet - low sodium heart healthy        06/15/23 1419           Regular diet DISCHARGE MEDICATION: Allergies as of 06/15/2023       Reactions   Allergy Medication [diphenhydramine] Swelling, Other (See Comments)   Face swells.         Medication List     TAKE these medications    folic acid 1 MG tablet Commonly known as: FOLVITE Take 1 tablet (1 mg total) by mouth daily.   hydroxyurea 500 MG capsule Commonly known as: HYDREA Take 1 capsule (500 mg total) by mouth daily. May take with food to minimize GI side effects.   ibuprofen 800 MG tablet Commonly known as: ADVIL Take 1 tablet (800 mg total) by mouth every 8 (eight) hours as needed.   Oxycodone HCl 10 MG Tabs Take 1 tablet (10 mg total) by mouth every 6 (six) hours as needed.        Discharge Exam: There were no vitals filed for this visit. Constitutional: NAD, calm, comfortable Eyes: PERRL, lids and conjunctivae normal ENMT: Mucous membranes are moist. Posterior pharynx clear of any exudate or lesions.Normal dentition.  Neck: normal, supple, no masses, no thyromegaly Respiratory: clear to auscultation bilaterally,  no wheezing, no crackles. Normal respiratory effort. No accessory muscle use.  Cardiovascular: Regular rate and rhythm, no murmurs / rubs / gallops. No extremity edema. 2+ pedal pulses. No carotid bruits.  Abdomen: no tenderness, no masses palpated. No hepatosplenomegaly. Bowel sounds positive.  Musculoskeletal: Good range of motion, no joint swelling or tenderness, Skin: no rashes, lesions, ulcers. No induration Neurologic: CN 2-12 grossly intact. Sensation intact, DTR normal. Strength 5/5 in all 4.  Psychiatric: Normal judgment and insight. Alert and oriented x 3. Normal mood   Condition at discharge: good  The results of significant diagnostics from this hospitalization (including imaging, microbiology, ancillary and laboratory) are listed below for reference.   Imaging Studies: No results found.  Microbiology: Results for orders placed or performed during the hospital encounter of 08/11/22  Resp panel by RT-PCR (RSV, Flu A&B, Covid) Anterior Nasal Swab     Status: Abnormal   Collection Time: 08/11/22  8:35 PM   Specimen: Anterior Nasal Swab  Result Value Ref Range Status   SARS Coronavirus 2 by RT PCR NEGATIVE NEGATIVE Final    Comment: (NOTE) SARS-CoV-2 target nucleic acids are NOT DETECTED.  The SARS-CoV-2 RNA is generally detectable in upper respiratory specimens during the acute phase of infection. The lowest concentration of SARS-CoV-2 viral copies this assay can detect is 138 copies/mL. A negative result does not preclude SARS-Cov-2 infection and  should not be used as the sole basis for treatment or other patient management decisions. A negative result may occur with  improper specimen collection/handling, submission of specimen other than nasopharyngeal swab, presence of viral mutation(s) within the areas targeted by this assay, and inadequate number of viral copies(<138 copies/mL). A negative result must be combined with clinical observations, patient history, and  epidemiological information. The expected result is Negative.  Fact Sheet for Patients:  BloggerCourse.com  Fact Sheet for Healthcare Providers:  SeriousBroker.it  This test is no t yet approved or cleared by the [place] FDA and  has been authorized for detection and/or diagnosis of SARS-CoV-2 by FDA under an Emergency Use Authorization (EUA). This EUA will remain  in effect (meaning this test can be used) for the duration of the COVID-19 declaration under Section 564(b)(1) of the Act, 21 U.S.C.section 360bbb-3(b)(1), unless the authorization is terminated  or revoked sooner.       Influenza A by PCR POSITIVE (A) NEGATIVE Final   Influenza B by PCR NEGATIVE NEGATIVE Final    Comment: (NOTE) The Xpert Xpress SARS-CoV-2/FLU/RSV plus assay is intended as an aid in the diagnosis of influenza from Nasopharyngeal swab specimens and should not be used as a sole basis for treatment. Nasal washings and aspirates are unacceptable for Xpert Xpress SARS-CoV-2/FLU/RSV testing.  Fact Sheet for Patients: BloggerCourse.com  Fact Sheet for Healthcare Providers: SeriousBroker.it  This test is not yet approved or cleared by the [place] FDA and has been authorized for detection and/or diagnosis of SARS-CoV-2 by FDA under an Emergency Use Authorization (EUA). This EUA will remain in effect (meaning this test can be used) for the duration of the COVID-19 declaration under Section 564(b)(1) of the Act, 21 U.S.C. section 360bbb-3(b)(1), unless the authorization is terminated or revoked.     Resp Syncytial Virus by PCR NEGATIVE NEGATIVE Final    Comment: (NOTE) Fact Sheet for Patients: BloggerCourse.com  Fact Sheet for Healthcare Providers: SeriousBroker.it  This test is not yet approved or cleared by the [place] FDA and has  been authorized for detection and/or diagnosis of SARS-CoV-2 by FDA under an Emergency Use Authorization (EUA). This EUA will remain in effect (meaning this test can be used) for the duration of the COVID-19 declaration under Section 564(b)(1) of the Act, 21 U.S.C. section 360bbb-3(b)(1), unless the authorization is terminated or revoked.  Performed at [facility], [address] [place], [place] [address]   MRSA Next Gen by PCR, Nasal     Status: None   Collection Time: 08/13/22  9:48 PM   Specimen: Nasal Swab  Result Value Ref Range Status   MRSA by PCR Next Gen NOT DETECTED NOT DETECTED Final    Comment: (NOTE) The GeneXpert MRSA Assay (FDA approved for NASAL specimens only), is one component of a comprehensive MRSA colonization surveillance program. It is not intended to diagnose MRSA infection nor to guide or monitor treatment for MRSA infections. Test performance is not FDA approved in patients less than 72 years old. Performed at [facility], [address], [place], [place] [address]     Labs: CBC: Recent Labs  Lab 06/09/23 1018  WBC CANCELED  NEUTROABS CANCELED  HGB CANCELED  HCT CANCELED  MCV CANCELED  PLT CANCELED   Basic Metabolic Panel: Recent Labs  Lab 06/09/23 1018  NA 140  K 5.4*  CL 104  CO2 16*  GLUCOSE 100*  BUN 7  CREATININE 0.90  CALCIUM 9.9  Liver Function Tests: Recent Labs  Lab 06/09/23 1018  AST 34  ALT 10  ALKPHOS 137*  BILITOT 1.3*  PROT 7.5  ALBUMIN 4.7   CBG: No results for input(s): "GLUCAP" in the last 168 hours.  Discharge time spent: less than 30 minutes.  SignedLonia Blood, MD [facility] 06/15/2023

## 2023-06-15 NOTE — H&P (Signed)
History and Physical    Patient: Jonathan [name] NGE:[id number] DOB: [birth year] DOA: 06/15/2023 DOS: the patient was seen and examined on 06/15/2023 PCP: Quentin Angst, MD  Patient coming from: Home  Chief Complaint: No chief complaint on file.  HPI: Jonathan [name] is a 22 y.o. male with medical history significant of sickle cell disease, anemia of chronic disease, chronic pain syndrome who presented to the ER with complaint of pain in his back rated as 7 out of 10.  Patient took his ibuprofen and Tylenol but the pain persisted.  He has oxycodone usually but he is out of oxycodone.  Came to the day hospital for further evaluation and treatment.  He denied any nausea vomiting no diarrhea.  Denied any other complaint.  Patient is admitted for pain management.  Review of Systems: As mentioned in the history of present illness. All other systems reviewed and are negative. Past Medical History:  Diagnosis Date   Acute chest syndrome(517.3) 08/26/2011   march 2013, Sept 2013   Asthma    Avascular necrosis of bone of left hip (HCC)    Sickle cell disease, type SS (HCC)    Past Surgical History:  Procedure Laterality Date   ADENOIDECTOMY     SPLENECTOMY     TONSILLECTOMY     TONSILLECTOMY AND ADENOIDECTOMY     TOTAL HIP ARTHROPLASTY Left 03/07/2016   UMBILICAL HERNIA REPAIR     Social History:  reports that he has never smoked. He has never used smokeless tobacco. He reports that he does not drink alcohol and does not use drugs.  Allergies  Allergen Reactions   Allergy Medication [Diphenhydramine] Swelling and Other (See Comments)    Face swells.     Family History  Problem Relation Age of Onset   Hypertension Mother    Hypertension Maternal Grandmother    Hypertension Maternal Grandfather    Asthma Paternal Grandfather    Diabetes Paternal Grandfather     Prior to Admission medications   Medication Sig Start Date End Date Taking? Authorizing Provider  folic acid  (FOLVITE) 1 MG tablet Take 1 tablet (1 mg total) by mouth daily. 12/02/22 12/02/23  Massie Maroon, FNP  hydroxyurea (HYDREA) 500 MG capsule Take 1 capsule (500 mg total) by mouth daily. May take with food to minimize GI side effects. 04/20/23   Quentin Angst, MD  ibuprofen (ADVIL) 800 MG tablet Take 1 tablet (800 mg total) by mouth every 8 (eight) hours as needed. 06/01/23   Massie Maroon, FNP  Oxycodone HCl 10 MG TABS Take 1 tablet (10 mg total) by mouth every 6 (six) hours as needed. 06/01/23   Massie Maroon, FNP    Physical Exam: There were no vitals filed for this visit. Constitutional: NAD, calm, comfortable Eyes: PERRL, lids and conjunctivae normal ENMT: Mucous membranes are moist. Posterior pharynx clear of any exudate or lesions.Normal dentition.  Neck: normal, supple, no masses, no thyromegaly Respiratory: clear to auscultation bilaterally, no wheezing, no crackles. Normal respiratory effort. No accessory muscle use.  Cardiovascular: Regular rate and rhythm, no murmurs / rubs / gallops. No extremity edema. 2+ pedal pulses. No carotid bruits.  Abdomen: no tenderness, no masses palpated. No hepatosplenomegaly. Bowel sounds positive.  Musculoskeletal: Good range of motion, no joint swelling or tenderness, Skin: no rashes, lesions, ulcers. No induration Neurologic: CN 2-12 grossly intact. Sensation intact, DTR normal. Strength 5/5 in all 4.  Psychiatric: Normal judgment and insight. Alert and oriented x 3. Normal  mood  Data Reviewed:  There are no new results to review at this time.  Assessment and Plan:  #1 sickle cell pain crisis: Patient will be admitted to the day hospital.  Initiate Dilaudid PCA, Toradol and IV fluids.  Will get basic labs.  When patient has his pain improved controlled will discharge home on his home regimen.  #2 chronic pain syndrome: The patient is out of his oxycodone.  Will see if he is time for his refill and we will get it to him at  discharge  #3 anemia of chronic disease: Will get H&H.    Advance Care Planning:   Code Status: Full Code   Consults: None  Family Communication: No family at bedside  Severity of Illness: The appropriate patient status for this patient is OBSERVATION. Observation status is judged to be reasonable and necessary in order to provide the required intensity of service to ensure the patient's safety. The patient's presenting symptoms, physical exam findings, and initial radiographic and laboratory data in the context of their medical condition is felt to place them at decreased risk for further clinical deterioration. Furthermore, it is anticipated that the patient will be medically stable for discharge from the hospital within 2 midnights of admission.   AuthorLonia Blood, MD 06/15/2023 9:13 AM  For on call review [email or web address].

## 2023-06-15 NOTE — Progress Notes (Addendum)
Patient admitted to the day hospital for sickle cell pain. Initially, patient reported back pain rated 7/10. For pain management, patient placed on Sickle Cell Dose Dilaudid PCA, given 15 mg IV Toradol and hydrated with IV fluids. At discharge, patient rated pain at 2/10. Vital signs stable. AVS offered but patient refused. Patient alert, oriented and ambulatory at discharge.

## 2023-06-18 ENCOUNTER — Telehealth (HOSPITAL_COMMUNITY): Payer: Self-pay

## 2023-06-18 ENCOUNTER — Non-Acute Institutional Stay (HOSPITAL_COMMUNITY)
Admission: AD | Admit: 2023-06-18 | Discharge: 2023-06-18 | Disposition: A | Discharge status: Home / Self Care | Payer: 59 | Source: Ambulatory Visit | Attending: Internal Medicine | Admitting: Internal Medicine

## 2023-06-18 ENCOUNTER — Telehealth: Payer: Self-pay | Admitting: Internal Medicine

## 2023-06-18 DIAGNOSIS — D5700 Hb-SS disease with crisis, unspecified: Secondary | ICD-10-CM | POA: Diagnosis not present

## 2023-06-18 DIAGNOSIS — G894 Chronic pain syndrome: Secondary | ICD-10-CM | POA: Diagnosis not present

## 2023-06-18 LAB — LACTATE DEHYDROGENASE: LDH: 449 U/L — ABNORMAL HIGH (ref 98–192)

## 2023-06-18 MED ORDER — POLYETHYLENE GLYCOL 3350 17 G PO PACK
17.0000 g | PACK | Freq: Every day | ORAL | Status: DC | PRN
Start: 2023-06-18 — End: 2023-06-18

## 2023-06-18 MED ORDER — ONDANSETRON HCL 4 MG/2ML IJ SOLN
4.0000 mg | Freq: Four times a day (QID) | INTRAMUSCULAR | Status: DC | PRN
  Administered 2023-06-18: 4 mg via INTRAVENOUS
  Filled 2023-06-18: qty 2

## 2023-06-18 MED ORDER — SODIUM CHLORIDE 0.9% FLUSH: 9.0000 mL | INTRAVENOUS | Status: DC | PRN

## 2023-06-18 MED ORDER — DEXTROSE-SODIUM CHLORIDE 5-0.45 % IV SOLN: INTRAVENOUS | Status: DC

## 2023-06-18 MED ORDER — KETOROLAC TROMETHAMINE 15 MG/ML IJ SOLN
15.0000 mg | Freq: Four times a day (QID) | INTRAMUSCULAR | Status: DC
  Administered 2023-06-18: 15 mg via INTRAVENOUS
  Filled 2023-06-18: qty 1

## 2023-06-18 MED ORDER — SENNOSIDES-DOCUSATE SODIUM 8.6-50 MG PO TABS: 1.0000 | ORAL_TABLET | Freq: Two times a day (BID) | ORAL | Status: DC

## 2023-06-18 MED ORDER — HYDROMORPHONE 1 MG/ML IV SOLN
INTRAVENOUS | Status: DC
  Administered 2023-06-18: 6.5 mg via INTRAVENOUS
  Administered 2023-06-18: 30 mg via INTRAVENOUS
  Filled 2023-06-18: qty 30

## 2023-06-18 MED ORDER — DIPHENHYDRAMINE HCL 25 MG PO CAPS: 25.0000 mg | ORAL_CAPSULE | ORAL | Status: DC | PRN

## 2023-06-18 MED ORDER — NALOXONE HCL 0.4 MG/ML IJ SOLN: 0.4000 mg | INTRAMUSCULAR | Status: DC | PRN

## 2023-06-18 NOTE — Telephone Encounter (Signed)
Patient called day hospital wanting to come in for sickle cell pain treatment. Pt reports 7/10 pain. Pt reports taking Ibuprofen and Tylenol. Pt states that he still hasn't been able to pick up his Oxycodone. Per pts chart there is no medication refill request. Pt advised to request medication so it can be refilled, pt verbalized understanding. Pt denies being to the ER recently. Pt screened for COVID and denies symptoms and exposures. Pt denies fever, chest pain, N/V/D, and abdominal pain. Dr. Mikeal Hawthorne notified and per provider pt can come to the day hospital today. Pt notified and verbalized understanding. Pt states that his uncle will be his transportation today.

## 2023-06-18 NOTE — Telephone Encounter (Signed)
Caller & Relationship to patient:  MRN #  [id number]   Call Back Number:   Date of Last Office Visit: 06/09/2023     Date of Next Office Visit: 09/08/2023    Medication(s) to be Refilled: Oxycodone, Hydroxyurea Ibuprofen   Preferred Pharmacy:   ** Please notify patient to allow 48-72 hours to process** **Let patient know to contact pharmacy at the end of the day to make sure medication is ready. ** **If patient has not been seen in a year or longer, book an appointment **Advise to use MyChart for refill requests OR to contact their pharmacy

## 2023-06-18 NOTE — H&P (Signed)
History and Physical    Patient: Jonathan [name] YNW:[id number] DOB: March 29, 2001 DOA: 06/18/2023 DOS: the patient was seen and examined on 06/18/2023 PCP: Quentin Angst, MD  Patient coming from: Home  Chief Complaint: No chief complaint on file.  HPI: Jonathan [name] is a 22 y.o. male with medical history significant of Sickle cell Disease who called the [facility] Cell day Hospital with 7/10 pain  Pt reports taking Ibuprofen and Tylenol. Pt states that he still hasn't been able to pick up his Oxycodone. Per pts chart there is no medication refill request. Pt advised to request medication so it can be refilled, pt verbalized understanding. Pt denies being to the ER recently. Pt screened for COVID and denies symptoms and exposures. Pt denies fever, chest pain, N/V/D, and abdominal pain.   Review of Systems: As mentioned in the history of present illness. All other systems reviewed and are negative. Past Medical History:  Diagnosis Date   Acute chest syndrome(517.3) 08/26/2011   march 2013, Sept 2013   Asthma    Avascular necrosis of bone of left hip (HCC)    Sickle cell disease, type SS (HCC)    Past Surgical History:  Procedure Laterality Date   ADENOIDECTOMY     SPLENECTOMY     TONSILLECTOMY     TONSILLECTOMY AND ADENOIDECTOMY     TOTAL HIP ARTHROPLASTY Left 03/07/2016   UMBILICAL HERNIA REPAIR     Social History:  reports that he has never smoked. He has never used smokeless tobacco. He reports that he does not drink alcohol and does not use drugs.  Allergies  Allergen Reactions   Allergy Medication [Diphenhydramine] Swelling and Other (See Comments)    Face swells.     Family History  Problem Relation Age of Onset   Hypertension Mother    Hypertension Maternal Grandmother    Hypertension Maternal Grandfather    Asthma Paternal Grandfather    Diabetes Paternal Grandfather     Prior to Admission medications   Medication Sig Start Date End Date Taking? Authorizing  Provider  folic acid (FOLVITE) 1 MG tablet Take 1 tablet (1 mg total) by mouth daily. 12/02/22 12/02/23  Massie Maroon, FNP  hydroxyurea (HYDREA) 500 MG capsule Take 1 capsule (500 mg total) by mouth daily. May take with food to minimize GI side effects. 04/20/23   Quentin Angst, MD  ibuprofen (ADVIL) 800 MG tablet Take 1 tablet (800 mg total) by mouth every 8 (eight) hours as needed. 06/01/23   Massie Maroon, FNP  Oxycodone HCl 10 MG TABS Take 1 tablet (10 mg total) by mouth every 6 (six) hours as needed. 06/01/23   Massie Maroon, FNP    Physical Exam: There were no vitals filed for this visit. Constitutional: NAD, calm, comfortable Eyes: PERRL, lids and conjunctivae normal ENMT: Mucous membranes are moist. Posterior pharynx clear of any exudate or lesions.Normal dentition.  Neck: normal, supple, no masses, no thyromegaly Respiratory: clear to auscultation bilaterally, no wheezing, no crackles. Normal respiratory effort. No accessory muscle use.  Cardiovascular: Regular rate and rhythm, no murmurs / rubs / gallops. No extremity edema. 2+ pedal pulses. No carotid bruits.  Abdomen: no tenderness, no masses palpated. No hepatosplenomegaly. Bowel sounds positive.  Musculoskeletal: Good range of motion, no joint swelling or tenderness, Skin: no rashes, lesions, ulcers. No induration Neurologic: CN 2-12 grossly intact. Sensation intact, DTR normal. Strength 5/5 in all 4.  Psychiatric: Normal judgment and insight. Alert and oriented x 3. Normal  mood  Data Reviewed:  There are no new results to review at this time.  Assessment and Plan:  #1. Sickle Cell Pain Crisis: Will admit and initiate Subcutaneous Dilaudid PCA, Toradol, IVF. Will initiate IVF.  #2. Chronic Pain Syndrome: Continue home regimen    Advance Care Planning:   Code Status: Prior Full Code  Consults: None  Family Communication: No family at bedside  Severity of Illness: The appropriate patient status for this  patient is OBSERVATION. Observation status is judged to be reasonable and necessary in order to provide the required intensity of service to ensure the patient's safety. The patient's presenting symptoms, physical exam findings, and initial radiographic and laboratory data in the context of their medical condition is felt to place them at decreased risk for further clinical deterioration. Furthermore, it is anticipated that the patient will be medically stable for discharge from the hospital within 2 midnights of admission.   AuthorLonia Blood, MD 06/18/2023 9:35 AM  For on call review [email or web address].

## 2023-06-18 NOTE — Discharge Summary (Signed)
Physician Discharge Summary   Patient: Jonathan [name] MRN: [id number] DOB: [birth year]  Admit date:     06/18/2023  Discharge date: 06/18/2023  Discharge Physician: Lonia Blood   PCP: Quentin Angst, MD   Recommendations at discharge: Patient was admitted to the sickle cell day hospital.   Follow up with PCP  Discharge Diagnoses: Active Problems:   Sickle cell pain crisis (HCC)  Resolved Problems:   * No resolved hospital problems. [facility] Course: Patient was admitted to the sickle cell day hospital.  Initiated on IV Dilaudid PCA, Toradol, IV fluids.  Patient also got IV Zofran.  Pain was down to 2 out of 10 at time of discharge.  He was discharged home to follow-up with PCP.  No fever or chills at time of discharge.  Assessment and Plan: No notes have been filed under this hospital service. Service: Hospitalist        Consultants: None Procedures performed: None  Disposition: Home Diet recommendation:  Discharge Diet Orders (From admission, onward)     Start     Ordered   06/18/23 0000  Diet - low sodium heart healthy        06/18/23 1250           Regular diet DISCHARGE MEDICATION: Allergies as of 06/18/2023       Reactions   Allergy Medication [diphenhydramine] Swelling, Other (See Comments)   Face swells.         Medication List     TAKE these medications    folic acid 1 MG tablet Commonly known as: FOLVITE Take 1 tablet (1 mg total) by mouth daily.        Discharge Exam: There were no vitals filed for this visit.Constitutional: NAD, calm, comfortable Eyes: PERRL, lids and conjunctivae normal ENMT: Mucous membranes are moist. Posterior pharynx clear of any exudate or lesions.Normal dentition.  Neck: normal, supple, no masses, no thyromegaly Respiratory: clear to auscultation bilaterally, no wheezing, no crackles. Normal respiratory effort. No accessory muscle use.  Cardiovascular: Regular rate and rhythm, no murmurs / rubs /  gallops. No extremity edema. 2+ pedal pulses. No carotid bruits.  Abdomen: no tenderness, no masses palpated. No hepatosplenomegaly. Bowel sounds positive.  Musculoskeletal: Good range of motion, no joint swelling or tenderness, Skin: no rashes, lesions, ulcers. No induration Neurologic: CN 2-12 grossly intact. Sensation intact, DTR normal. Strength 5/5 in all 4.  Psychiatric: Normal judgment and insight. Alert and oriented x 3. Normal mood   Condition at discharge: good  The results of significant diagnostics from this hospitalization (including imaging, microbiology, ancillary and laboratory) are listed below for reference.   Imaging Studies: No results found.  Microbiology: Results for orders placed or performed during the hospital encounter of 08/11/22  Resp panel by RT-PCR (RSV, Flu A&B, Covid) Anterior Nasal Swab     Status: Abnormal   Collection Time: 08/11/22  8:35 PM   Specimen: Anterior Nasal Swab  Result Value Ref Range Status   SARS Coronavirus 2 by RT PCR NEGATIVE NEGATIVE Final    Comment: (NOTE) SARS-CoV-2 target nucleic acids are NOT DETECTED.  The SARS-CoV-2 RNA is generally detectable in upper respiratory specimens during the acute phase of infection. The lowest concentration of SARS-CoV-2 viral copies this assay can detect is 138 copies/mL. A negative result does not preclude SARS-Cov-2 infection and should not be used as the sole basis for treatment or other patient management decisions. A negative result may occur with  improper specimen collection/handling, submission of  specimen other than nasopharyngeal swab, presence of viral mutation(s) within the areas targeted by this assay, and inadequate number of viral copies(<138 copies/mL). A negative result must be combined with clinical observations, patient history, and epidemiological information. The expected result is Negative.  Fact Sheet for Patients:  BloggerCourse.com  Fact  Sheet for Healthcare Providers:  SeriousBroker.it  This test is no t yet approved or cleared by the [place] FDA and  has been authorized for detection and/or diagnosis of SARS-CoV-2 by FDA under an Emergency Use Authorization (EUA). This EUA will remain  in effect (meaning this test can be used) for the duration of the COVID-19 declaration under Section 564(b)(1) of the Act, 21 U.S.C.section 360bbb-3(b)(1), unless the authorization is terminated  or revoked sooner.       Influenza A by PCR POSITIVE (A) NEGATIVE Final   Influenza B by PCR NEGATIVE NEGATIVE Final    Comment: (NOTE) The Xpert Xpress SARS-CoV-2/FLU/RSV plus assay is intended as an aid in the diagnosis of influenza from Nasopharyngeal swab specimens and should not be used as a sole basis for treatment. Nasal washings and aspirates are unacceptable for Xpert Xpress SARS-CoV-2/FLU/RSV testing.  Fact Sheet for Patients: BloggerCourse.com  Fact Sheet for Healthcare Providers: SeriousBroker.it  This test is not yet approved or cleared by the [place] FDA and has been authorized for detection and/or diagnosis of SARS-CoV-2 by FDA under an Emergency Use Authorization (EUA). This EUA will remain in effect (meaning this test can be used) for the duration of the COVID-19 declaration under Section 564(b)(1) of the Act, 21 U.S.C. section 360bbb-3(b)(1), unless the authorization is terminated or revoked.     Resp Syncytial Virus by PCR NEGATIVE NEGATIVE Final    Comment: (NOTE) Fact Sheet for Patients: BloggerCourse.com  Fact Sheet for Healthcare Providers: SeriousBroker.it  This test is not yet approved or cleared by the [place] FDA and has been authorized for detection and/or diagnosis of SARS-CoV-2 by FDA under an Emergency Use Authorization (EUA). This EUA will remain in effect  (meaning this test can be used) for the duration of the COVID-19 declaration under Section 564(b)(1) of the Act, 21 U.S.C. section 360bbb-3(b)(1), unless the authorization is terminated or revoked.  Performed at [facility], [address], [place], [place] [address]   MRSA Next Gen by PCR, Nasal     Status: None   Collection Time: 08/13/22  9:48 PM   Specimen: Nasal Swab  Result Value Ref Range Status   MRSA by PCR Next Gen NOT DETECTED NOT DETECTED Final    Comment: (NOTE) The GeneXpert MRSA Assay (FDA approved for NASAL specimens only), is one component of a comprehensive MRSA colonization surveillance program. It is not intended to diagnose MRSA infection nor to guide or monitor treatment for MRSA infections. Test performance is not FDA approved in patients less than 44 years old. Performed at [facility], [address], [place], [place] [address]     Labs: CBC: No results for input(s): "WBC", "NEUTROABS", "HGB", "HCT", "MCV", "PLT" in the last 168 hours. Basic Metabolic Panel: No results for input(s): "NA", "K", "CL", "CO2", "GLUCOSE", "BUN", "CREATININE", "CALCIUM", "MG", "PHOS" in the last 168 hours. Liver Function Tests: No results for input(s): "AST", "ALT", "ALKPHOS", "BILITOT", "PROT", "ALBUMIN" in the last 168 hours. CBG: No results for input(s): "GLUCAP" in the last 168 hours.  Discharge time spent: less than 30 minutes.  SignedLonia Blood, MD [facility] 06/23/2023

## 2023-06-18 NOTE — Progress Notes (Signed)
Pt admitted to day hospital today for sickle cell pain treatment. On arrival, pt rates pain 7/10 to his right leg. Pt received Dilaudid PCA, IV Toradol, IV Zofran, and hydrated with IV fluids via PIV. At discharge pt rates pain 2/10. AVS offered, but pt declined. Pt is alert, oriented, and ambulatory at discharge.

## 2023-06-19 ENCOUNTER — Telehealth: Payer: Self-pay | Admitting: Internal Medicine

## 2023-06-19 NOTE — Telephone Encounter (Signed)
Error

## 2023-06-22 ENCOUNTER — Other Ambulatory Visit: Payer: Self-pay | Admitting: Internal Medicine

## 2023-06-22 DIAGNOSIS — D571 Sickle-cell disease without crisis: Secondary | ICD-10-CM

## 2023-06-22 DIAGNOSIS — D5700 Hb-SS disease with crisis, unspecified: Secondary | ICD-10-CM

## 2023-06-22 DIAGNOSIS — Z79891 Long term (current) use of opiate analgesic: Secondary | ICD-10-CM

## 2023-06-22 MED ORDER — HYDROXYUREA 500 MG PO CAPS: 500.0000 mg | ORAL_CAPSULE | Freq: Every day | ORAL | 3 refills | Status: DC

## 2023-06-22 MED ORDER — OXYCODONE HCL 10 MG PO TABS: 10.0000 mg | ORAL_TABLET | Freq: Four times a day (QID) | ORAL | 0 refills | Status: DC | PRN

## 2023-06-22 MED ORDER — IBUPROFEN 800 MG PO TABS: 800.0000 mg | ORAL_TABLET | Freq: Three times a day (TID) | ORAL | 4 refills | Status: DC | PRN

## 2023-06-23 NOTE — Hospital Course (Signed)
Patient was admitted to the sickle cell day hospital.  Initiated on IV Dilaudid PCA, Toradol, IV fluids.  Patient also got IV Zofran.  Pain was down to 2 out of 10 at time of discharge.  He was discharged home to follow-up with PCP.  No fever or chills at time of discharge.

## 2023-07-14 ENCOUNTER — Telehealth: Payer: Self-pay | Admitting: Internal Medicine

## 2023-07-14 ENCOUNTER — Telehealth: Payer: Self-pay | Admitting: Family Medicine

## 2023-07-14 DIAGNOSIS — D5700 Hb-SS disease with crisis, unspecified: Secondary | ICD-10-CM

## 2023-07-14 DIAGNOSIS — Z79891 Long term (current) use of opiate analgesic: Secondary | ICD-10-CM

## 2023-07-14 DIAGNOSIS — D571 Sickle-cell disease without crisis: Secondary | ICD-10-CM

## 2023-07-14 MED ORDER — OXYCODONE HCL 10 MG PO TABS: 10.0000 mg | ORAL_TABLET | Freq: Four times a day (QID) | ORAL | 0 refills | Status: DC | PRN

## 2023-07-14 MED ORDER — HYDROXYUREA 500 MG PO CAPS: 500.0000 mg | ORAL_CAPSULE | Freq: Every day | ORAL | 3 refills | Status: DC

## 2023-07-14 NOTE — Telephone Encounter (Signed)
Reviewed PDMP substance reporting system prior to prescribing opiate medications. No inconsistencies noted.  Meds ordered this encounter  Medications   Oxycodone HCl 10 MG TABS    Sig: Take 1 tablet (10 mg total) by mouth every 6 (six) hours as needed.    Dispense:  60 tablet    Refill:  0    Order Specific Question:   Supervising Provider    Answer:   Quentin Angst [[id number]]   hydroxyurea (HYDREA) 500 MG capsule    Sig: Take 1 capsule (500 mg total) by mouth daily. May take with food to minimize GI side effects.    Dispense:  90 capsule    Refill:  3    Order Specific Question:   Supervising Provider    Answer:   Quentin Angst [[id number]]   Jonathan Nations  APRN, MSN, FNP-C Patient Care [facility] [address] [place], [place] [address] [phone]

## 2023-07-14 NOTE — Telephone Encounter (Signed)
Prescription Request  07/14/2023  LOV: Visit date not found  What is the name of the medication or equipment? Hydroxyurea and oxycodone   Have you contacted your pharmacy to request a refill? Yes   Which pharmacy would you like this sent to?  [facility] [phone] - [place], [place] - [address] [facility] [address] [place] [address] Phone: [phone] Fax: [phone]     Patient notified that their request is being sent to the clinical staff for review and that they should receive a response within 2 business days.   Please advise at [facility] [phone]

## 2023-07-17 ENCOUNTER — Other Ambulatory Visit: Payer: Self-pay

## 2023-07-17 ENCOUNTER — Encounter (HOSPITAL_COMMUNITY): Payer: Self-pay

## 2023-07-17 ENCOUNTER — Emergency Department (HOSPITAL_COMMUNITY)
Admission: EM | Admit: 2023-07-17 | Discharge: 2023-07-17 | Disposition: A | Discharge status: Home / Self Care | Payer: 59 | Attending: Emergency Medicine | Admitting: Emergency Medicine

## 2023-07-17 DIAGNOSIS — D5700 Hb-SS disease with crisis, unspecified: Secondary | ICD-10-CM | POA: Insufficient documentation

## 2023-07-17 DIAGNOSIS — M549 Dorsalgia, unspecified: Secondary | ICD-10-CM | POA: Diagnosis not present

## 2023-07-17 LAB — COMPREHENSIVE METABOLIC PANEL
ALT: 11 U/L (ref 0–44)
AST: 31 U/L (ref 15–41)
Albumin: 4.6 g/dL (ref 3.5–5.0)
Alkaline Phosphatase: 123 U/L (ref 38–126)
Anion gap: 9 (ref 5–15)
BUN: 12 mg/dL (ref 6–20)
CO2: 23 mmol/L (ref 22–32)
Calcium: 8.9 mg/dL (ref 8.9–10.3)
Chloride: 108 mmol/L (ref 98–111)
Creatinine, Ser: 0.72 mg/dL (ref 0.61–1.24)
GFR, Estimated: >60 mL/min (ref >60)
Glucose, Bld: 137 mg/dL — ABNORMAL HIGH (ref 70–99)
Potassium: 4.2 mmol/L (ref 3.5–5.1)
Sodium: 140 mmol/L (ref 135–145)
Total Bilirubin: 3.7 mg/dL — ABNORMAL HIGH (ref <1.2)
Total Protein: 8.2 g/dL — ABNORMAL HIGH (ref 6.5–8.1)

## 2023-07-17 LAB — CBC WITH DIFFERENTIAL/PLATELET
Abs Immature Granulocytes: 0.38 10*3/uL — ABNORMAL HIGH (ref 0.00–0.07)
Basophils Absolute: 0.1 10*3/uL (ref 0.0–0.1)
Basophils Relative: 1 %
Eosinophils Absolute: 0.4 10*3/uL (ref 0.0–0.5)
Eosinophils Relative: 3 %
HCT: 29.3 % — ABNORMAL LOW (ref 39.0–52.0)
Hemoglobin: 10.2 g/dL — ABNORMAL LOW (ref 13.0–17.0)
Immature Granulocytes: 3 %
Lymphocytes Relative: 16 %
Lymphs Abs: 2.3 10*3/uL (ref 0.7–4.0)
MCH: 34 pg (ref 26.0–34.0)
MCHC: 34.8 g/dL (ref 30.0–36.0)
MCV: 97.7 fL (ref 80.0–100.0)
Monocytes Absolute: 1 10*3/uL (ref 0.1–1.0)
Monocytes Relative: 7 %
Neutro Abs: 9.6 10*3/uL — ABNORMAL HIGH (ref 1.7–7.7)
Neutrophils Relative %: 70 %
Platelets: 173 10*3/uL (ref 150–400)
RBC: 3 MIL/uL — ABNORMAL LOW (ref 4.22–5.81)
RDW: 21 % — ABNORMAL HIGH (ref 11.5–15.5)
WBC: 13.8 10*3/uL — ABNORMAL HIGH (ref 4.0–10.5)
nRBC: 6.1 % — ABNORMAL HIGH (ref 0.0–0.2)

## 2023-07-17 LAB — RETICULOCYTES
Immature Retic Fract: 40.4 % — ABNORMAL HIGH (ref 2.3–15.9)
RBC.: 3 MIL/uL — ABNORMAL LOW (ref 4.22–5.81)
Retic Count, Absolute: 289.2 10*3/uL — ABNORMAL HIGH (ref 19.0–186.0)
Retic Ct Pct: 9.6 % — ABNORMAL HIGH (ref 0.4–3.1)

## 2023-07-17 LAB — URINALYSIS, W/ REFLEX TO CULTURE (INFECTION SUSPECTED)
Bacteria, UA: NONE SEEN
Bilirubin Urine: NEGATIVE
Glucose, UA: NEGATIVE mg/dL
Hgb urine dipstick: NEGATIVE
Ketones, ur: NEGATIVE mg/dL
Leukocytes,Ua: NEGATIVE
Nitrite: NEGATIVE
Protein, ur: NEGATIVE mg/dL
Specific Gravity, Urine: 1.011 (ref 1.005–1.030)
pH: 6 (ref 5.0–8.0)

## 2023-07-17 MED ORDER — HYDROMORPHONE HCL 1 MG/ML IJ SOLN
0.5000 mg | Freq: Once | INTRAMUSCULAR | Status: AC
  Administered 2023-07-17: 0.5 mg via INTRAVENOUS
  Filled 2023-07-17: qty 1

## 2023-07-17 MED ORDER — KETOROLAC TROMETHAMINE 15 MG/ML IJ SOLN
15.0000 mg | INTRAMUSCULAR | Status: AC
  Administered 2023-07-17: 15 mg via INTRAVENOUS
  Filled 2023-07-17: qty 1

## 2023-07-17 MED ORDER — HYDROMORPHONE HCL 1 MG/ML IJ SOLN
1.0000 mg | Freq: Once | INTRAMUSCULAR | Status: AC
  Administered 2023-07-17: 1 mg via INTRAMUSCULAR
  Filled 2023-07-17: qty 1

## 2023-07-17 MED ORDER — SODIUM CHLORIDE 0.45 % IV SOLN: INTRAVENOUS | Status: DC

## 2023-07-17 MED ORDER — ONDANSETRON HCL 4 MG/2ML IJ SOLN
4.0000 mg | INTRAMUSCULAR | Status: DC | PRN
  Administered 2023-07-17: 4 mg via INTRAVENOUS
  Filled 2023-07-17: qty 2

## 2023-07-17 NOTE — ED Triage Notes (Signed)
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  Denies cp/sob  Oxycodone at 1400 and ibuprofen at 1200 w/o relief.

## 2023-07-17 NOTE — ED Provider Notes (Signed)
[place] EMERGENCY DEPARTMENT AT [facility] Provider Note   CSN: [id number] Arrival date & time: 07/17/23  1605     History {Add pertinent medical, surgical, social history, OB history to HPI:1} Chief Complaint  Patient presents with   Sickle Cell Pain Crisis    Jonathan [name] is a 22 y.o. male.   Sickle Cell Pain Crisis      Home Medications Prior to Admission medications   Medication Sig Start Date End Date Taking? Authorizing Provider  folic acid (FOLVITE) 1 MG tablet Take 1 tablet (1 mg total) by mouth daily. 12/02/22 12/02/23  Massie Maroon, FNP  hydroxyurea (HYDREA) 500 MG capsule Take 1 capsule (500 mg total) by mouth daily. May take with food to minimize GI side effects. 07/14/23   Massie Maroon, FNP  ibuprofen (ADVIL) 800 MG tablet Take 1 tablet (800 mg total) by mouth every 8 (eight) hours as needed. 06/22/23   Quentin Angst, MD  Oxycodone HCl 10 MG TABS Take 1 tablet (10 mg total) by mouth every 6 (six) hours as needed. 07/14/23   Massie Maroon, FNP      Allergies    Allergy medication [diphenhydramine]    Review of Systems   Review of Systems  Physical Exam Updated Vital Signs BP 134/83   Pulse 77   Temp 98 F (36.7 C)   Resp 20   Wt 69 kg   SpO2 94%   BMI 25.31 kg/m  Physical Exam  ED Results / Procedures / Treatments   Labs (all labs ordered are listed, but only abnormal results are displayed) Labs Reviewed  COMPREHENSIVE METABOLIC PANEL - Abnormal; Notable for the following components:      Result Value   Glucose, Bld 137 (*)    Total Protein 8.2 (*)    Total Bilirubin 3.7 (*)    All other components within normal limits  CBC WITH DIFFERENTIAL/PLATELET - Abnormal; Notable for the following components:   RBC 3.00 (*)    Hemoglobin 10.2 (*)    HCT 29.3 (*)    RDW 21.0 (*)    All other components within normal limits  RETICULOCYTES - Abnormal; Notable for the following components:   Retic Ct Pct 9.6 (*)     RBC. 3.00 (*)    Retic Count, Absolute 289.2 (*)    Immature Retic Fract 40.4 (*)    All other components within normal limits  URINALYSIS, W/ REFLEX TO CULTURE (INFECTION SUSPECTED)    EKG None  Radiology No results found.  Procedures Procedures  {Document cardiac monitor, telemetry assessment procedure when appropriate:1}  Medications Ordered in ED Medications - No data to display  ED Course/ Medical Decision Making/ A&P   {   Click here for ABCD2, HEART and other calculatorsREFRESH Note before signing :1}                              Medical Decision Making  ***  {Document critical care time when appropriate:1} {Document review of labs and clinical decision tools ie heart score, Chads2Vasc2 etc:1}  {Document your independent review of radiology images, and any outside records:1} {Document your discussion with family members, caretakers, and with consultants:1} {Document social determinants of health affecting pt's care:1} {Document your decision making why or why not admission, treatments were needed:1} Final Clinical Impression(s) / ED Diagnoses Final diagnoses:  None    Rx / DC Orders ED Discharge Orders  None

## 2023-07-17 NOTE — ED Notes (Signed)
Pt. Made aware for the need of urine specimen.

## 2023-07-17 NOTE — Discharge Instructions (Signed)
Return for any problem.  ?

## 2023-07-17 NOTE — ED Provider Triage Note (Signed)
Emergency Medicine Provider Triage Evaluation Note  Jonathan [name] , [name] 21 y.o. male  was evaluated in triage.  Pt complains of sickle cell pain. Pain to left lower back, goes into BIL legs. No swelling, numbness, weakness, no falls or injuries. No CP, SOB, fever, dysuria, hematuria  Review of Systems  Positive: Low back pain, BIL leg pain Negative:   Physical Exam  BP 134/83   Pulse 77   Temp 98 F (36.7 C)   Resp 20   Wt 69 kg   SpO2 94%   BMI 25.31 kg/m  Gen:   Awake, no distress   Resp:  Normal effort  MSK:   Moves extremities without difficulty, compartments soft, Nn tender BLE Other:    Medical Decision Making  Medically screening exam initiated at 5:31 PM.  Appropriate orders placed.  Jonathan [name] was informed that the remainder of the evaluation will be completed by another provider, this initial triage assessment does not replace that evaluation, and the importance of remaining in the ED until their evaluation is complete.  pain   Jonathan Climer A, PA-C 07/17/23 1732

## 2023-07-18 ENCOUNTER — Other Ambulatory Visit: Payer: Self-pay

## 2023-07-18 ENCOUNTER — Encounter (HOSPITAL_COMMUNITY): Payer: Self-pay

## 2023-07-18 ENCOUNTER — Inpatient Hospital Stay (HOSPITAL_COMMUNITY)
Admission: EM | Admit: 2023-07-18 | Discharge: 2023-07-22 | DRG: 812 | Disposition: A | Discharge status: Home / Self Care | Payer: 59 | Attending: Internal Medicine | Admitting: Internal Medicine

## 2023-07-18 DIAGNOSIS — R030 Elevated blood-pressure reading, without diagnosis of hypertension: Secondary | ICD-10-CM | POA: Diagnosis not present

## 2023-07-18 DIAGNOSIS — Z862 Personal history of diseases of the blood and blood-forming organs and certain disorders involving the immune mechanism: Secondary | ICD-10-CM | POA: Diagnosis not present

## 2023-07-18 DIAGNOSIS — M62830 Muscle spasm of back: Secondary | ICD-10-CM | POA: Diagnosis not present

## 2023-07-18 DIAGNOSIS — Z96642 Presence of left artificial hip joint: Secondary | ICD-10-CM | POA: Diagnosis present

## 2023-07-18 DIAGNOSIS — D5700 Hb-SS disease with crisis, unspecified: Secondary | ICD-10-CM | POA: Diagnosis not present

## 2023-07-18 DIAGNOSIS — G894 Chronic pain syndrome: Secondary | ICD-10-CM | POA: Diagnosis present

## 2023-07-18 DIAGNOSIS — R509 Fever, unspecified: Secondary | ICD-10-CM | POA: Diagnosis not present

## 2023-07-18 DIAGNOSIS — Z79899 Other long term (current) drug therapy: Secondary | ICD-10-CM

## 2023-07-18 DIAGNOSIS — Z825 Family history of asthma and other chronic lower respiratory diseases: Secondary | ICD-10-CM

## 2023-07-18 DIAGNOSIS — R000 Tachycardia, unspecified: Secondary | ICD-10-CM | POA: Diagnosis present

## 2023-07-18 DIAGNOSIS — Z888 Allergy status to other drugs, medicaments and biological substances status: Secondary | ICD-10-CM

## 2023-07-18 DIAGNOSIS — D72829 Elevated white blood cell count, unspecified: Secondary | ICD-10-CM | POA: Insufficient documentation

## 2023-07-18 DIAGNOSIS — F1120 Opioid dependence, uncomplicated: Secondary | ICD-10-CM | POA: Diagnosis present

## 2023-07-18 DIAGNOSIS — J45909 Unspecified asthma, uncomplicated: Secondary | ICD-10-CM | POA: Diagnosis present

## 2023-07-18 DIAGNOSIS — Z9081 Acquired absence of spleen: Secondary | ICD-10-CM

## 2023-07-18 DIAGNOSIS — Z8249 Family history of ischemic heart disease and other diseases of the circulatory system: Secondary | ICD-10-CM

## 2023-07-18 DIAGNOSIS — R7402 Elevation of levels of lactic acid dehydrogenase (LDH): Secondary | ICD-10-CM | POA: Diagnosis present

## 2023-07-18 LAB — COMPREHENSIVE METABOLIC PANEL
ALT: 16 U/L (ref 0–44)
AST: 45 U/L — ABNORMAL HIGH (ref 15–41)
Albumin: 4.2 g/dL (ref 3.5–5.0)
Alkaline Phosphatase: 106 U/L (ref 38–126)
Anion gap: 8 (ref 5–15)
BUN: 11 mg/dL (ref 6–20)
CO2: 23 mmol/L (ref 22–32)
Calcium: 8.7 mg/dL — ABNORMAL LOW (ref 8.9–10.3)
Chloride: 106 mmol/L (ref 98–111)
Creatinine, Ser: 0.76 mg/dL (ref 0.61–1.24)
GFR, Estimated: >60 mL/min (ref >60)
Glucose, Bld: 107 mg/dL — ABNORMAL HIGH (ref 70–99)
Potassium: 3.8 mmol/L (ref 3.5–5.1)
Sodium: 137 mmol/L (ref 135–145)
Total Bilirubin: 2.6 mg/dL — ABNORMAL HIGH (ref <1.2)
Total Protein: 7.3 g/dL (ref 6.5–8.1)

## 2023-07-18 LAB — IRON AND TIBC
Iron: 41 ug/dL — ABNORMAL LOW (ref 45–182)
Saturation Ratios: 11 % — ABNORMAL LOW (ref 17.9–39.5)
TIBC: 378 ug/dL (ref 250–450)
UIBC: 337 ug/dL

## 2023-07-18 LAB — FOLATE: Folate: 6 ng/mL (ref >5.9)

## 2023-07-18 LAB — CBC
HCT: 25.2 % — ABNORMAL LOW (ref 39.0–52.0)
Hemoglobin: 8.7 g/dL — ABNORMAL LOW (ref 13.0–17.0)
MCH: 33.5 pg (ref 26.0–34.0)
MCHC: 34.5 g/dL (ref 30.0–36.0)
MCV: 96.9 fL (ref 80.0–100.0)
Platelets: 188 10*3/uL (ref 150–400)
RBC: 2.6 MIL/uL — ABNORMAL LOW (ref 4.22–5.81)
RDW: 20.2 % — ABNORMAL HIGH (ref 11.5–15.5)
WBC: 13.4 10*3/uL — ABNORMAL HIGH (ref 4.0–10.5)
nRBC: 7.5 % — ABNORMAL HIGH (ref 0.0–0.2)

## 2023-07-18 LAB — RETICULOCYTES
Immature Retic Fract: 43.2 % — ABNORMAL HIGH (ref 2.3–15.9)
RBC.: 2.61 MIL/uL — ABNORMAL LOW (ref 4.22–5.81)
Retic Count, Absolute: 214 10*3/uL — ABNORMAL HIGH (ref 19.0–186.0)
Retic Ct Pct: 8.2 % — ABNORMAL HIGH (ref 0.4–3.1)

## 2023-07-18 LAB — BILIRUBIN, DIRECT: Bilirubin, Direct: 0.4 mg/dL — ABNORMAL HIGH (ref 0.0–0.2)

## 2023-07-18 LAB — LACTATE DEHYDROGENASE: LDH: 408 U/L — ABNORMAL HIGH (ref 98–192)

## 2023-07-18 LAB — VITAMIN B12: Vitamin B-12: 221 pg/mL (ref 180–914)

## 2023-07-18 LAB — FERRITIN: Ferritin: 1089 ng/mL — ABNORMAL HIGH (ref 24–336)

## 2023-07-18 LAB — HIV ANTIBODY (ROUTINE TESTING W REFLEX): HIV Screen 4th Generation wRfx: NONREACTIVE

## 2023-07-18 MED ORDER — ENOXAPARIN SODIUM 40 MG/0.4ML IJ SOSY: 40.0000 mg | PREFILLED_SYRINGE | INTRAMUSCULAR | Status: DC

## 2023-07-18 MED ORDER — HYDROMORPHONE HCL 2 MG/ML IJ SOLN: 2.0000 mg | Freq: Once | INTRAMUSCULAR | Status: DC

## 2023-07-18 MED ORDER — NALOXONE HCL 0.4 MG/ML IJ SOLN: 0.4000 mg | INTRAMUSCULAR | Status: DC | PRN

## 2023-07-18 MED ORDER — HYDROMORPHONE HCL 1 MG/ML IJ SOLN
1.0000 mg | Freq: Once | INTRAMUSCULAR | Status: AC
  Administered 2023-07-18: 1 mg via INTRAVENOUS
  Filled 2023-07-18: qty 1

## 2023-07-18 MED ORDER — DIPHENHYDRAMINE HCL 50 MG/ML IJ SOLN: 12.5000 mg | Freq: Four times a day (QID) | INTRAMUSCULAR | Status: DC | PRN

## 2023-07-18 MED ORDER — IBUPROFEN 200 MG PO TABS: 400.0000 mg | ORAL_TABLET | Freq: Four times a day (QID) | ORAL | Status: DC | PRN

## 2023-07-18 MED ORDER — POLYETHYLENE GLYCOL 3350 17 G PO PACK
17.0000 g | PACK | Freq: Every day | ORAL | Status: DC | PRN
Start: 2023-07-18 — End: 2023-07-22

## 2023-07-18 MED ORDER — SODIUM CHLORIDE 0.9% FLUSH: 3.0000 mL | Freq: Two times a day (BID) | INTRAVENOUS | Status: DC

## 2023-07-18 MED ORDER — HYDROXYUREA 500 MG PO CAPS: 1000.0000 mg | ORAL_CAPSULE | Freq: Every day | ORAL | Status: DC

## 2023-07-18 MED ORDER — ASPIRIN 81 MG PO TBEC
81.0000 mg | DELAYED_RELEASE_TABLET | Freq: Every day | ORAL | Status: DC
  Administered 2023-07-18 – 2023-07-22 (×5): 81 mg via ORAL
  Filled 2023-07-18 (×5): qty 1

## 2023-07-18 MED ORDER — HYDROXYUREA 500 MG PO CAPS
1000.0000 mg | ORAL_CAPSULE | Freq: Every day | ORAL | Status: DC
  Administered 2023-07-18 – 2023-07-20 (×3): 1000 mg via ORAL
  Filled 2023-07-18 (×3): qty 2

## 2023-07-18 MED ORDER — HYDROMORPHONE 1 MG/ML IV SOLN
INTRAVENOUS | Status: AC
  Administered 2023-07-18: 3.5 mg via INTRAVENOUS
  Administered 2023-07-18: 30 mg via INTRAVENOUS
  Administered 2023-07-18: 2.5 mg via INTRAVENOUS
  Administered 2023-07-18: 4.4 mg via INTRAVENOUS
  Administered 2023-07-18: 3.5 mg via INTRAVENOUS
  Administered 2023-07-19: 4.5 mg via INTRAVENOUS
  Administered 2023-07-19: 1.5 mg via INTRAVENOUS
  Administered 2023-07-19 (×2): 3 mg via INTRAVENOUS
  Administered 2023-07-19: 30 mg via INTRAVENOUS
  Administered 2023-07-19: 2.5 mg via INTRAVENOUS
  Administered 2023-07-20: 4 mg via INTRAVENOUS
  Filled 2023-07-18 (×2): qty 30

## 2023-07-18 MED ORDER — OXYCODONE HCL 5 MG PO TABS
10.0000 mg | ORAL_TABLET | Freq: Four times a day (QID) | ORAL | Status: DC | PRN
  Administered 2023-07-19 – 2023-07-20 (×3): 10 mg via ORAL
  Filled 2023-07-18 (×3): qty 2

## 2023-07-18 MED ORDER — ASPIRIN 81 MG PO TBEC: 81.0000 mg | DELAYED_RELEASE_TABLET | Freq: Every day | ORAL | Status: DC

## 2023-07-18 MED ORDER — LACTATED RINGERS IV SOLN: INTRAVENOUS | Status: DC

## 2023-07-18 MED ORDER — SODIUM CHLORIDE 0.9 % IV SOLN: 250.0000 mL | INTRAVENOUS | Status: DC | PRN

## 2023-07-18 MED ORDER — FOLIC ACID 1 MG PO TABS
1.0000 mg | ORAL_TABLET | Freq: Every day | ORAL | Status: DC
  Administered 2023-07-18 – 2023-07-22 (×5): 1 mg via ORAL
  Filled 2023-07-18 (×5): qty 1

## 2023-07-18 MED ORDER — ONDANSETRON HCL 4 MG/2ML IJ SOLN
4.0000 mg | INTRAMUSCULAR | Status: DC | PRN
  Administered 2023-07-18: 4 mg via INTRAVENOUS
  Filled 2023-07-18: qty 2

## 2023-07-18 MED ORDER — SODIUM CHLORIDE 0.9% FLUSH
3.0000 mL | Freq: Two times a day (BID) | INTRAVENOUS | Status: DC
  Administered 2023-07-18 – 2023-07-22 (×6): 3 mL via INTRAVENOUS

## 2023-07-18 MED ORDER — HYDROMORPHONE 1 MG/ML IV SOLN: INTRAVENOUS | Status: DC

## 2023-07-18 MED ORDER — KETOROLAC TROMETHAMINE 15 MG/ML IJ SOLN
15.0000 mg | INTRAMUSCULAR | Status: AC
  Administered 2023-07-18: 15 mg via INTRAVENOUS
  Filled 2023-07-18: qty 1

## 2023-07-18 MED ORDER — DOCUSATE SODIUM 100 MG PO CAPS
100.0000 mg | ORAL_CAPSULE | Freq: Two times a day (BID) | ORAL | Status: DC
  Administered 2023-07-18 – 2023-07-22 (×8): 100 mg via ORAL
  Filled 2023-07-18 (×9): qty 1

## 2023-07-18 MED ORDER — DIPHENHYDRAMINE HCL 12.5 MG/5ML PO ELIX: 12.5000 mg | ORAL_SOLUTION | Freq: Four times a day (QID) | ORAL | Status: DC | PRN

## 2023-07-18 MED ORDER — ENOXAPARIN SODIUM 40 MG/0.4ML IJ SOSY
40.0000 mg | PREFILLED_SYRINGE | INTRAMUSCULAR | Status: DC
  Administered 2023-07-18 – 2023-07-22 (×5): 40 mg via SUBCUTANEOUS
  Filled 2023-07-18 (×5): qty 0.4

## 2023-07-18 MED ORDER — SODIUM CHLORIDE 0.9% FLUSH: 3.0000 mL | INTRAVENOUS | Status: DC | PRN

## 2023-07-18 MED ORDER — HYDROMORPHONE HCL 1 MG/ML IJ SOLN
1.0000 mg | INTRAMUSCULAR | Status: AC
  Administered 2023-07-18: 1 mg via INTRAVENOUS
  Filled 2023-07-18: qty 1

## 2023-07-18 MED ORDER — KETOROLAC TROMETHAMINE 15 MG/ML IJ SOLN
15.0000 mg | Freq: Four times a day (QID) | INTRAMUSCULAR | Status: AC
  Administered 2023-07-18 – 2023-07-19 (×4): 15 mg via INTRAVENOUS
  Filled 2023-07-18 (×4): qty 1

## 2023-07-18 MED ORDER — DIPHENHYDRAMINE HCL 25 MG PO CAPS
25.0000 mg | ORAL_CAPSULE | ORAL | Status: DC | PRN
  Filled 2023-07-18: qty 1

## 2023-07-18 MED ORDER — LACTATED RINGERS IV BOLUS
1000.0000 mL | Freq: Once | INTRAVENOUS | Status: AC
  Administered 2023-07-18: 1000 mL via INTRAVENOUS

## 2023-07-18 MED ORDER — SODIUM CHLORIDE 0.9% FLUSH: 9.0000 mL | INTRAVENOUS | Status: DC | PRN

## 2023-07-18 NOTE — ED Provider Notes (Signed)
[place] EMERGENCY DEPARTMENT AT [facility] Provider Note   CSN: [id number] Arrival date & time: 07/18/23  4098     History  Chief Complaint  Patient presents with   Sickle Cell Pain Crisis    Jonathan [name] is a 22 y.o. male.  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  States that the pain is the same as when he presented less than 12 hours ago to the ED.  Took his home oxycodone without relief.  Denies fever, chest pain, hemoptysis, leg swelling.  The history is provided by the patient. No language interpreter was used.  Sickle Cell Pain Crisis      Home Medications Prior to Admission medications   Medication Sig Start Date End Date Taking? Authorizing Provider  folic acid (FOLVITE) 1 MG tablet Take 1 tablet (1 mg total) by mouth daily. 12/02/22 12/02/23 Yes Massie Maroon, FNP  hydroxyurea (HYDREA) 500 MG capsule Take 1 capsule (500 mg total) by mouth daily. May take with food to minimize GI side effects. Patient taking differently: Take 1,000 mg by mouth daily. Take 1,000 mg by mouth once a day- with food to minimize GI side effects 07/14/23  Yes Massie Maroon, FNP  ibuprofen (ADVIL) 800 MG tablet Take 1 tablet (800 mg total) by mouth every 8 (eight) hours as needed. Patient taking differently: Take 800 mg by mouth every 8 (eight) hours as needed for mild pain (pain score 1-3) or headache. 06/22/23  Yes Quentin Angst, MD  Oxycodone HCl 10 MG TABS Take 1 tablet (10 mg total) by mouth every 6 (six) hours as needed. Patient taking differently: Take 10 mg by mouth every 6 (six) hours as needed (for pain). 07/14/23  Yes Massie Maroon, FNP      Allergies    Allergy medication [diphenhydramine]    Review of Systems   Review of Systems Ten systems reviewed and are negative for acute change, except as noted  in the HPI.    Physical Exam Updated Vital Signs BP (!) 160/87   Pulse 97   Temp 98.4 F (36.9 C)   Resp 15   SpO2 94%   Physical Exam Vitals and nursing note reviewed.  Constitutional:      General: He is not in acute distress.    Appearance: He is well-developed. He is not diaphoretic.     Comments: Appears uncomfortable, grunting.  HENT:     Head: Normocephalic and atraumatic.  Eyes:     General: No scleral icterus.    Conjunctiva/sclera: Conjunctivae normal.  Cardiovascular:     Rate and Rhythm: Normal rate and regular rhythm.     Pulses: Normal pulses.  Pulmonary:     Effort: Pulmonary effort is normal. No respiratory distress.     Breath sounds: No stridor. No wheezing.     Comments: Respirations even and unlabored Musculoskeletal:        General: Normal range of motion.     Cervical back: Normal range of motion.  Skin:    General: Skin is warm and dry.     Coloration: Skin is not pale.     Findings: No erythema or rash.  Neurological:     Mental Status: He is alert and oriented to person, place, and time.     Coordination: Coordination normal.     Comments: Moving  all extremities spontaneously  Psychiatric:        Behavior: Behavior normal.     ED Results / Procedures / Treatments   Labs (all labs ordered are listed, but only abnormal results are displayed) Labs Reviewed  CBC  COMPREHENSIVE METABOLIC PANEL  BILIRUBIN, DIRECT  HIV ANTIBODY (ROUTINE TESTING W REFLEX)    EKG None  Radiology No results found.  Procedures Procedures    Medications Ordered in ED Medications  ondansetron (ZOFRAN) injection 4 mg (4 mg Intravenous Given 07/18/23 0505)  lactated ringers bolus 1,000 mL (has no administration in time range)  lactated ringers infusion (has no administration in time range)  naloxone [facility]) injection 0.4 mg (has no administration in time range)    And  sodium chloride flush (NS) 0.9 % injection 9 mL (has no administration in time  range)  HYDROmorphone (DILAUDID) 1 mg/mL PCA injection (has no administration in time range)  diphenhydrAMINE (BENADRYL) injection 12.5 mg (has no administration in time range)    Or  diphenhydrAMINE (BENADRYL) 12.5 MG/5ML elixir 12.5 mg (has no administration in time range)  aspirin EC tablet 81 mg (has no administration in time range)  hydroxyurea (HYDREA) capsule 1,000 mg (has no administration in time range)  folic acid (FOLVITE) tablet 1 mg (has no administration in time range)  enoxaparin (LOVENOX) injection 40 mg (has no administration in time range)  sodium chloride flush (NS) 0.9 % injection 3 mL (has no administration in time range)  sodium chloride flush (NS) 0.9 % injection 3 mL (has no administration in time range)  sodium chloride flush (NS) 0.9 % injection 3 mL (has no administration in time range)  0.9 %  sodium chloride infusion (has no administration in time range)  ibuprofen (ADVIL) tablet 400 mg (has no administration in time range)  docusate sodium (COLACE) capsule 100 mg (has no administration in time range)  polyethylene glycol (MIRALAX / GLYCOLAX) packet 17 g (has no administration in time range)  HYDROmorphone (DILAUDID) injection 1 mg (has no administration in time range)  ketorolac (TORADOL) 15 MG/ML injection 15 mg (15 mg Intravenous Given 07/18/23 0505)  HYDROmorphone (DILAUDID) injection 1 mg (1 mg Intravenous Given 07/18/23 0505)    ED Course/ Medical Decision Making/ A&P                                 Medical Decision Making Risk Prescription drug management. Decision regarding hospitalization.   This patient presents to the ED for concern of back and extremity pain, this involves an extensive number of treatment options, and is a complaint that carries with it a high risk of complications and morbidity.  The differential diagnosis includes sprain/strain vs fx vs limb ischemia vs sickle cell pain crisis.   Co morbidities that complicate the patient  evaluation  Sickle Cell Anemia AVN L hip Asthma   Additional history obtained:  External records from outside source obtained and reviewed including most recent CBC, CMP, Reticulocyte count from prior visit on 07/17/23; stable.   Cardiac Monitoring:  The patient was maintained on a cardiac monitor.  I personally viewed and interpreted the cardiac monitored which showed an underlying rhythm of: NSR   Medicines ordered and prescription drug management:  I ordered medication including Toradol and Dilaudid for pain  Reevaluation of the patient after these medicines showed that the patient improved I have reviewed the patients home medicines and have made adjustments as needed  Consultations Obtained:  I requested consultation with the hospitalist and discussed lab and imaging findings as well as pertinent plan - they will assess the patient in the ED for admission.   Problem List / ED Course:  Patient returning <12 hours since last ED presentation for persistent pain. Prior labs c/w baseline. Hospitalist paged for admission for ongoing pain control.   Reevaluation:  After the interventions noted above, I reevaluated the patient and found that they have : remained stable   Social Determinants of Health:  Lives independently   Dispostion:  After consideration of the diagnostic results and the patients response to treatment, I feel that the patent would benefit from admission for ongoing pain control of sickle cell crisis. Denies chest pain, fevers. Hemodynamically stable.          Final Clinical Impression(s) / ED Diagnoses Final diagnoses:  Sickle cell pain crisis [facility])    Rx / DC Orders ED Discharge Orders     None         Antony Madura, PA-C 07/18/23 0531    Nira Conn, MD 07/18/23 [id number]

## 2023-07-18 NOTE — Progress Notes (Signed)
Patient ID: Jonathan [name], male   DOB: 2001/01/19, 22 y.o.   MRN: [id number] Subjective: Jonathan [name] is a 22 y.o. male with medical history significant of Sickle cell Disease, chronic pain syndrome, opiate dependence and tolerance, and anemia of chronic disease who was admitted earlier this morning for sickle cell pain crisis.  Patient continues to endorse significant pain, generalized but mostly in his lower back and lower extremities.  He has not found significant relief since on admission.  He denies any fever, cough, chest pain, shortness of breath, nausea, vomiting or diarrhea.  No urinary symptoms.  Objective:  Vital signs in last 24 hours:  Vitals:   07/18/23 0739 07/18/23 0913 07/18/23 1232 07/18/23 1327  BP:  (!) 142/99  (!) 151/88  Pulse:  96  95  Resp: 20 20 19 20   Temp:  98.6 F (37 C)  97.9 F (36.6 C)  TempSrc:  Oral  Oral  SpO2: 93% 94% 95% 97%    Intake/Output from previous day:   Intake/Output Summary (Last 24 hours) at 07/18/2023 1630 Last data filed at 07/18/2023 1330 Gross per 24 hour  Intake --  Output 1350 ml  Net -1350 ml    Physical Exam: General: Alert, awake, oriented x3, in no acute distress.  HEENT: [place]/AT PEERL, EOMI Neck: Trachea midline,  no masses, no thyromegal,y no JVD, no carotid bruit OROPHARYNX:  Moist, No exudate/ erythema/lesions.  Heart: Regular rate and rhythm, without murmurs, rubs, gallops, PMI non-displaced, no heaves or thrills on palpation.  Lungs: Clear to auscultation, no wheezing or rhonchi noted. No increased vocal fremitus resonant to percussion  Abdomen: Soft, nontender, nondistended, positive bowel sounds, no masses no hepatosplenomegaly noted..  Neuro: No focal neurological deficits noted cranial nerves II through XII grossly intact. DTRs 2+ bilaterally upper and lower extremities. Strength 5 out of 5 in bilateral upper and lower extremities. Musculoskeletal: No warm swelling or erythema around joints, no spinal  tenderness noted. Psychiatric: Patient alert and oriented x3, good insight and cognition, good recent to remote recall. Lymph node survey: No cervical axillary or inguinal lymphadenopathy noted.  Lab Results:  Basic Metabolic Panel:    Component Value Date/Time   NA 137 07/18/2023 0528   NA 140 06/09/2023 1018   K 3.8 07/18/2023 0528   CL 106 07/18/2023 0528   CO2 23 07/18/2023 0528   BUN 11 07/18/2023 0528   BUN 7 06/09/2023 1018   CREATININE 0.76 07/18/2023 0528   CREATININE 0.70 05/12/2016 1052   GLUCOSE 107 (H) 07/18/2023 0528   CALCIUM 8.7 (L) 07/18/2023 0528   CBC:    Component Value Date/Time   WBC 13.4 (H) 07/18/2023 0528   HGB 8.7 (L) 07/18/2023 0528   HGB CANCELED 06/09/2023 1018   HCT 25.2 (L) 07/18/2023 0528   HCT CANCELED 06/09/2023 1018   PLT 188 07/18/2023 0528   PLT CANCELED 06/09/2023 1018   MCV 96.9 07/18/2023 0528   MCV CANCELED 06/09/2023 1018   NEUTROABS 9.6 (H) 07/17/2023 1642   NEUTROABS CANCELED 06/09/2023 1018   LYMPHSABS 2.3 07/17/2023 1642   LYMPHSABS CANCELED 06/09/2023 1018   MONOABS 1.0 07/17/2023 1642   EOSABS 0.4 07/17/2023 1642   EOSABS CANCELED 06/09/2023 1018   BASOSABS 0.1 07/17/2023 1642   BASOSABS CANCELED 06/09/2023 1018    No results found for this or any previous visit (from the past 240 hour(s)).  Studies/Results: No results found.  Medications: Scheduled Meds:  aspirin EC  81 mg Oral Daily   docusate  sodium  100 mg Oral BID   enoxaparin (LOVENOX) injection  40 mg Subcutaneous Q24H   folic acid  1 mg Oral Daily   HYDROmorphone   Intravenous Q4H   hydroxyurea  1,000 mg Oral Daily   ketorolac  15 mg Intravenous Q6H   sodium chloride flush  3 mL Intravenous Q12H   Continuous Infusions: PRN Meds:.diphenhydrAMINE, naloxone **AND** sodium chloride flush, ondansetron, polyethylene glycol  Consultants: None  Procedures: None  Antibiotics: None  Assessment/Plan: Active Problems:   Sickle cell pain crisis  (HCC)   History of sickle cell disease   Bilirubinemia   Elevated blood pressure reading   Leukocytosis  Hb Sickle Cell Disease with Pain crisis: Change IV fluid from lactated Ringer's to IVF 0.45% Saline at a reduced rate to 75 mls/hour, continue weight based Dilaudid PCA at current dose setting, increase the dose of IV Toradol 15 mg Q 6 H from 7.5 mg Q8 hourly, restart and continue oral home pain medications.  Monitor vitals very closely, Re-evaluate pain scale regularly, 2 L of Oxygen by [place]. Leukocytosis: Mild at 13.4.  There is no evidence of infection or inflammation.  This is most likely reactive.  Will continue to monitor very closely without antibiotics. Anemia of Chronic Disease: Hemoglobin is stable at baseline today.  There is no clinical indication for blood transfusion at this time.  Will monitor closely and transfuse as appropriate. Chronic pain Syndrome: Will restart and continue oral home pain medications. Elevated blood pressure: Patient has not been diagnosed with hypertension.  Will control pain and monitor blood pressure very closely.  Please do not use beta-blocker to control blood pressure until pain is adequately controlled.  Code Status: Full Code Family Communication: N/A Disposition Plan: Not yet ready for discharge  Hikaru Delorenzo  If 7PM-7AM, please contact night-coverage.  07/18/2023, 4:30 PM  LOS: 0 days

## 2023-07-18 NOTE — ED Triage Notes (Signed)
Pt reports with [facility] since Friday morning. PT complains of pain to both legs and lower back. Last medication taken Ibuprofen 800 mg and Oxycodone 10 mg.

## 2023-07-18 NOTE — H&P (Addendum)
History and Physical    Jonathan [name] ZOX:[id number] DOB: 2000/12/26 DOA: 07/18/2023  PCP: Quentin Angst, MD   Patient coming from: Home   Chief Complaint:  Chief Complaint  Patient presents with   Sickle Cell Pain Crisis   ED TRIAGE note:C/o [facility] with pain to back and bilateral legs since 10am.  Denies cp/sob  Oxycodone at 1400 and ibuprofen at 1200 w/o relief.   HPI:  Jonathan [name] is a 22 y.o. male with medical history significant of sickle cell disease presented to emergency department for complaining of bilateral lower extremity pain and back pain which started around 10 AM 07/17/2023. Patient patient presented to ED earlier in the evening with complaining of bilateral lower extremities pain.  His pain responded well with ibuprofen and oxycodone in the ED and he has been discharged but came back later in the evening for worsening bilateral lower extremities pain which has been remained persistent and also now have been developing back pain as well. Patient denies any chest pain, palpitation, shortness of breath, headache, blurry vision, and abdominal pain.  Denies any fever, chill, cough and sputum production.  Patient reported he mostly has bilateral upper thigh pain 8 out of 10 intensity and back pain 8 out of 10 intensity as well.  ED Course:  At presentation to ED patient found to have elevated blood pressure 160/87.  O2 sat 94% room air.  Heart rate 97 respiratory 15.  CBC showing leukocytosis 13.  8, RBC 3, hemoglobin 10.2, hematocrit 29, MCV 97, and platelet count 173. Absolute reticulocyte count elevated 289 and increased immature reticulocyte fraction 40. CMP unremarkable except elevated bilirubin 3.7. Normal UA.  In the ED patient has been given Toradol 50 mg and Dilaudid 1 mg.  Review of Systems:  Review of Systems  Constitutional:  Negative for chills, fever, malaise/fatigue and weight loss.  HENT:  Negative for tinnitus.   Eyes:  Negative for  blurred vision.  Respiratory:  Negative for cough, sputum production and shortness of breath.   Cardiovascular:  Negative for chest pain and leg swelling.  Gastrointestinal:  Negative for abdominal pain, heartburn, nausea and vomiting.  Musculoskeletal:  Positive for back pain.       Bilateral thigh pain 8 out of 10 intensity  Neurological:  Negative for dizziness and headaches.  Endo/Heme/Allergies:  Does not bruise/bleed easily.  Psychiatric/Behavioral:  The patient is not nervous/anxious.     Past Medical History:  Diagnosis Date   Acute chest syndrome(517.3) 08/26/2011   march 2013, Sept 2013   Asthma    Avascular necrosis of bone of left hip (HCC)    Sickle cell disease, type SS (HCC)     Past Surgical History:  Procedure Laterality Date   ADENOIDECTOMY     SPLENECTOMY     TONSILLECTOMY     TONSILLECTOMY AND ADENOIDECTOMY     TOTAL HIP ARTHROPLASTY Left 03/07/2016   UMBILICAL HERNIA REPAIR       reports that he has never smoked. He has never used smokeless tobacco. He reports that he does not drink alcohol and does not use drugs.  Allergies  Allergen Reactions   Allergy Medication [Diphenhydramine] Swelling and Other (See Comments)    Face swells    Family History  Problem Relation Age of Onset   Hypertension Mother    Hypertension Maternal Grandmother    Hypertension Maternal Grandfather    Asthma Paternal Grandfather    Diabetes Paternal Grandfather     Prior to  Admission medications   Medication Sig Start Date End Date Taking? Authorizing Provider  folic acid (FOLVITE) 1 MG tablet Take 1 tablet (1 mg total) by mouth daily. 12/02/22 12/02/23  Massie Maroon, FNP  hydroxyurea (HYDREA) 500 MG capsule Take 1 capsule (500 mg total) by mouth daily. May take with food to minimize GI side effects. Patient taking differently: Take 1,000 mg by mouth daily. Take 1,000 mg by mouth once a day- with food to minimize GI side effects 07/14/23   Massie Maroon, FNP   ibuprofen (ADVIL) 800 MG tablet Take 1 tablet (800 mg total) by mouth every 8 (eight) hours as needed. Patient taking differently: Take 800 mg by mouth every 8 (eight) hours as needed for mild pain (pain score 1-3) or headache. 06/22/23   Quentin Angst, MD  Oxycodone HCl 10 MG TABS Take 1 tablet (10 mg total) by mouth every 6 (six) hours as needed. Patient taking differently: Take 10 mg by mouth every 6 (six) hours as needed (for pain). 07/14/23   Massie Maroon, FNP     Physical Exam: Vitals:   07/18/23 0440 07/18/23 0500 07/18/23 0530  BP: (!) 160/87 (!) 143/83 (!) 140/87  Pulse: 97 (!) 101 86  Resp: 15 (!) 22 18  Temp: 98.4 F (36.9 C)    SpO2: 94% 94% 91%    Physical Exam Constitutional:      Appearance: He is ill-appearing.  HENT:     Mouth/Throat:     Mouth: Mucous membranes are dry.  Eyes:     Pupils: Pupils are equal, round, and reactive to light.  Cardiovascular:     Rate and Rhythm: Normal rate and regular rhythm.     Pulses: Normal pulses.     Heart sounds: Normal heart sounds.  Pulmonary:     Effort: Pulmonary effort is normal.     Breath sounds: Normal breath sounds.  Abdominal:     General: Bowel sounds are normal.     Palpations: Abdomen is soft.  Musculoskeletal:        General: No tenderness.     Cervical back: Neck supple.     Right lower leg: No edema.     Left lower leg: No edema.  Skin:    General: Skin is dry.     Coloration: Skin is not jaundiced.     Findings: No bruising or lesion.  Neurological:     Mental Status: He is alert and oriented to person, place, and time.  Psychiatric:        Mood and Affect: Mood normal.        Thought Content: Thought content normal.      Labs on Admission: I have personally reviewed following labs and imaging studies  CBC: Recent Labs  Lab 07/17/23 1642  WBC 13.8*  NEUTROABS 9.6*  HGB 10.2*  HCT 29.3*  MCV 97.7  PLT 173   Basic Metabolic Panel: Recent Labs  Lab 07/17/23 1642  NA  140  K 4.2  CL 108  CO2 23  GLUCOSE 137*  BUN 12  CREATININE 0.72  CALCIUM 8.9   GFR: Estimated Creatinine Clearance: 123.9 mL/min (by C-G formula based on SCr of 0.72 mg/dL). Liver Function Tests: Recent Labs  Lab 07/17/23 1642  AST 31  ALT 11  ALKPHOS 123  BILITOT 3.7*  PROT 8.2*  ALBUMIN 4.6   No results for input(s): "LIPASE", "AMYLASE" in the last 168 hours. No results for input(s): "AMMONIA" in the last  168 hours. Coagulation Profile: No results for input(s): "INR", "PROTIME" in the last 168 hours. Cardiac Enzymes: No results for input(s): "CKTOTAL", "CKMB", "CKMBINDEX", "TROPONINI", "TROPONINIHS" in the last 168 hours. BNP (last 3 results) No results for input(s): "BNP" in the last 8760 hours. HbA1C: No results for input(s): "HGBA1C" in the last 72 hours. CBG: No results for input(s): "GLUCAP" in the last 168 hours. Lipid Profile: No results for input(s): "CHOL", "HDL", "LDLCALC", "TRIG", "CHOLHDL", "LDLDIRECT" in the last 72 hours. Thyroid Function Tests: No results for input(s): "TSH", "T4TOTAL", "FREET4", "T3FREE", "THYROIDAB" in the last 72 hours. Anemia Panel: Recent Labs    07/17/23 1642  RETICCTPCT 9.6*   Urine analysis:    Component Value Date/Time   COLORURINE YELLOW 07/17/2023 1935   APPEARANCEUR CLEAR 07/17/2023 1935   LABSPEC 1.011 07/17/2023 1935   PHURINE 6.0 07/17/2023 1935   GLUCOSEU NEGATIVE 07/17/2023 1935   HGBUR NEGATIVE 07/17/2023 1935   BILIRUBINUR NEGATIVE 07/17/2023 1935   BILIRUBINUR negative 04/12/2020 1159   KETONESUR NEGATIVE 07/17/2023 1935   PROTEINUR NEGATIVE 07/17/2023 1935   UROBILINOGEN 1.0 04/12/2020 1159   UROBILINOGEN 1.0 04/04/2015 1712   NITRITE NEGATIVE 07/17/2023 1935   LEUKOCYTESUR NEGATIVE 07/17/2023 1935    Radiological Exams on Admission: I have personally reviewed images No results found.  EKG: Pending EKG.    Assessment/Plan: Active Problems:   Sickle cell pain crisis (HCC)   History of  sickle cell disease   Bilirubinemia   Elevated blood pressure reading   Leukocytosis    Assessment and Plan: Sickle cell pain crisis Sickle cell disease -Patient presenting second time to the emergency department today with worsening bilateral thigh pain and back pain.  He was here in the ED earlier for bilateral lower extremity pain and pain improved with ibuprofen oxycodone and patient was discharged home but unfortunately pain has been getting worse and he came back for further evaluation - At presentation to ER patient found to have elevated blood pressure 160/87 otherwise hemodynamically stable - Lab work from 4:30 PM WBC 13.8, RBC 3, hemoglobin 10.2, hematocrit 29, MCV 97, elevated RDW percentage 21% and platelet count 173.  Elevated absolute reticulocyte count 289 and immature reticulocyte count 40%. - CMP showing elevated bilirubin 3.7 (baseline bilirubin around 1.3-1.8) -In the ED patient received Dilaudid 1 mg and Toradol 15 mg 1 dose. - Currently patient is complaining about 8/10 intensity pain of bilateral thigh and lower back. - Giving another dose of Dilaudid 1 mg and starting Dilaudid PCA. - Starting aspirin 81 mg daily, continue folic acid 1 mg daily and hydroxyurea 1000 mg daily. -Continue continue to monitor vitals very closely as on PCA pump.  Continue cardiac monitoring. -As needed naloxone on board. -Patient reported remote history of allergy with Benadryl but unable to recall specifically.  Currently on Benadryl as needed with Dilaudid PCA pump protocol. -Continue to check pulse ox and supplemental oxygen to keep O2 sat above 92%.  Continue spirometry. Addendum: -Morning lab work showing hemoglobin has been dropped 10.2 to 8.7.  Hemoglobin drop likely secondary to RBC sickling.  Checking anemia panel and FOBT.  Continue to monitor hemoglobin level.  Hyperbilirubinemia -Elevated serum bilirubin 3.8.  Checking direct bilirubin.  Checking LDH and haptoglobin..  No evidence  of transaminitis. - Hyperbilirubinemia in the setting of red blood cell destruction in the setting of sickling of the red blood cell. -Monitor bilirubin level  Leukocytosis-reactive -Elevated WBC count 13.8.  Patient is afebrile.  Denies any cough, chest pain or  shortness of breath.  Denies any abdominal pain, nausea vomiting diarrhea.  No concern for infection at this time.  Reactive leukocytosis in the setting of sickle cell pain crisis.  Continue to monitor WBC count and monitor for development of any fever.  Elevated blood pressure reading - Initially patient blood pressure was 160/87 which has been improved to 140/87.  Elevated blood pressure reactive to pain.  Continue to monitor and currently managing pain.   DVT prophylaxis:  Lovenox Code Status:  Full Code Diet: Regular diet Family Communication: There is no family member at the bedside now Disposition Plan: Pending improvement of pain.  Tentative discharge to home next 1 to 2 days Consults:  none Admission status:   Observation, Telemetry bed  Severity of Illness: The appropriate patient status for this patient is OBSERVATION. Observation status is judged to be reasonable and necessary in order to provide the required intensity of service to ensure the patient's safety. The patient's presenting symptoms, physical exam findings, and initial radiographic and laboratory data in the context of their medical condition is felt to place them at decreased risk for further clinical deterioration. Furthermore, it is anticipated that the patient will be medically stable for discharge from the hospital within 2 midnights of admission.     Tereasa Coop, MD [facility]  How to contact the [facility] Attending or Consulting provider 7A - 7P or covering provider during after hours 7P -7A, for this patient.  Check the care team in [facility] and look for a) attending/consulting [facility] provider listed and b) the [facility] team listed Log into [email or web address]  and use [facility] universal password to access. If you do not have the password, please contact the hospital operator. Locate the [facility] provider you are looking for under [facility] and page to a number that you can be directly reached. If you still have difficulty reaching the provider, please page the [facility] (Director on Call) for the Hospitalists listed on amion for assistance.  07/18/2023, 5:56 AM

## 2023-07-18 NOTE — ED Notes (Signed)
ED TO INPATIENT HANDOFF REPORT  Name/Age/Gender Jonathan [name] 22 y.o. male  Code Status    Code Status Orders  (From admission, onward)           Start     Ordered   07/18/23 0516  Full code  Continuous       Question:  By:  Answer:  Consent: discussion documented in EHR   07/18/23 0516           Code Status History     Date Active Date Inactive Code Status Order ID Comments User Context   06/18/2023 0935 06/18/2023 2157 Full Code [id number]  Rometta Emery, MD Inpatient   06/15/2023 0913 06/15/2023 1944 Full Code [id number]  Rometta Emery, MD Inpatient   04/14/2023 [phone] 04/14/2023 2127 Full Code [id number]  Rometta Emery, MD Inpatient   04/13/2023 0948 04/13/2023 2124 Full Code [id number]  Quentin Angst, MD Inpatient   02/27/2023 0912 02/27/2023 2131 Full Code [id number]  Massie Maroon, FNP Inpatient   02/11/2023 0904 02/11/2023 2128 Full Code [id number]  Massie Maroon, FNP Inpatient   02/10/2023 0933 02/10/2023 2126 Full Code [id number]  Massie Maroon, FNP Inpatient   02/06/2023 0905 02/06/2023 2109 Full Code [id number]  Massie Maroon, FNP Inpatient   01/26/2023 0933 01/26/2023 2120 Full Code [id number]  Massie Maroon, FNP Inpatient   12/02/2022 1111 12/02/2022 2140 Full Code [id number]  Massie Maroon, FNP Inpatient   12/01/2022 1133 12/01/2022 2140 Full Code [id number]  Rometta Emery, MD Inpatient   11/13/2022 0900 11/13/2022 2126 Full Code [id number]  Massie Maroon, FNP Inpatient   08/12/2022 0545 08/19/2022 1912 Full Code [id number]  Darlin Drop, DO ED   08/11/2022 0921 08/11/2022 2003 Full Code [id number]  Massie Maroon, FNP Inpatient   06/11/2022 1123 06/14/2022 2134 Full Code [id number]  Massie Maroon, FNP ED   02/12/2022 0146 02/17/2022 2131 Full Code [id number]  Anselm Jungling, DO ED   02/11/2022 0902 02/11/2022 2231 Full Code [id number]  Massie Maroon, FNP Inpatient   01/28/2022 1003 01/28/2022 2215 Full Code [id number]  Massie Maroon, FNP  Inpatient   12/06/2021 1834 12/08/2021 2022 Full Code [id number]  Rometta Emery, MD Inpatient   12/03/2021 0933 12/03/2021 2152 Full Code [id number]  Rometta Emery, MD Inpatient   10/12/2021 0024 10/15/2021 1702 Full Code [id number]  Jacques Navy, MD ED   09/02/2021 1525 09/06/2021 1758 Full Code [id number]  Quentin Angst, MD ED   07/27/2021 1912 08/02/2021 1741 Full Code [id number]  Anselm Jungling, DO ED   06/26/2021 1857 06/28/2021 1835 Full Code [id number]  Massie Maroon, FNP ED   06/25/2021 0915 06/25/2021 2137 Full Code [id number]  Massie Maroon, FNP Inpatient   10/29/2020 1140 10/29/2020 2042 Full Code [id number]  Massie Maroon, FNP Inpatient   10/23/2020 2028 10/26/2020 1720 Full Code [id number]  Rometta Emery, MD ED   08/31/2020 1223 08/31/2020 2109 Full Code [id number]  Massie Maroon, FNP Inpatient   08/16/2020 1057 08/16/2020 2100 Full Code [id number]  Massie Maroon, FNP Inpatient   11/21/2017 1014 11/23/2017 2002 Full Code [id number]  Quentin Angst, MD Inpatient   10/13/2017 0238 10/14/2017 2030 Full Code [id number]  Hillary Bow, DO ED   04/27/2017 2010 04/29/2017 1805 Full Code [id number]  Hollice Gong, MD ED   04/17/2017 1626 04/24/2017 2042 Full Code [id number]  Kathlen Mody, MD Inpatient   12/02/2016 1608 12/07/2016  1327 Full Code [id number]  Warnell Forester, MD Inpatient   07/19/2016 1616 07/24/2016 1924 Full Code [id number]  Sarita Haver, MD Inpatient   05/02/2016 0134 05/09/2016 2014 Full Code [id number]  Minda Meo, MD ED   10/07/2015 0904 10/10/2015 2037 Full Code [id number]  Earl Lagos, MD ED   04/01/2015 1652 04/09/2015 2039 Full Code [id number]  [place], Katherine Inpatient   01/11/2015 0125 01/14/2015 1515 Full Code [id number]  Keith Rake, MD ED   06/09/2014 0741 06/12/2014 1949 Full Code [id number]  Sherrin Daisy, MD ED   08/05/2013 0646 08/06/2013 1249 Full Code [id number]  Kalman Jewels ED   10/02/2012 0715 10/05/2012 2319 Full Code [id number]  Claudius Sis, MD ED   09/01/2011 0953 09/06/2011 2047 Full Code [id number]  Permar, Barkley Bruns, MD ED       Home/SNF/Other Home  Chief Complaint Sickle cell pain crisis (HCC) [D57.00]  Level of Care/Admitting Diagnosis ED Disposition     ED Disposition  Admit   Condition  --   Comment  Hospital Area: [facility] [[id number]]  Level of Care: Telemetry [5]  Admit to tele based on following criteria: Other see comments  Comments: Monitor for tachycardia, bradycardia or arrhythmia  May place patient in observation at [facility] or [facility] if equivalent level of care is available:: No  Covid Evaluation: Asymptomatic - no recent exposure (last 10 days) testing not required  Diagnosis: Sickle cell pain crisis [facility]) [[id number]]  Admitting Physician: Tereasa Coop [[id number]]  Attending Physician: Tereasa Coop [[id number]]          Medical History Past Medical History:  Diagnosis Date   Acute chest syndrome(517.3) 08/26/2011   march 2013, Sept 2013   Asthma    Avascular necrosis of bone of left hip (HCC)    Sickle cell disease, type SS (HCC)     Allergies Allergies  Allergen Reactions   Allergy Medication [Diphenhydramine] Swelling and Other (See Comments)    Face swells    IV Location/Drains/Wounds Patient Lines/Drains/Airways Status     Active Line/Drains/Airways     Name Placement date Placement time Site Days   Peripheral IV 07/18/23 22 G Left;Posterior Forearm 07/18/23  0459  Forearm  less than 1            Labs/Imaging Results for orders placed or performed during the hospital encounter of 07/17/23 (from the past 48 hour(s))  Comprehensive metabolic panel     Status: Abnormal   Collection Time: 07/17/23  4:42 PM  Result Value Ref Range   Sodium 140 135 - 145 mmol/L   Potassium 4.2 3.5 - 5.1 mmol/L   Chloride 108 98 - 111 mmol/L   CO2 23 22 - 32 mmol/L   Glucose, Bld 137 (H) 70 - 99 mg/dL    Comment: Glucose reference range applies only  to samples taken after fasting for at least 8 hours.   BUN 12 6 - 20 mg/dL   Creatinine, Ser 3.76 0.61 - 1.24 mg/dL   Calcium 8.9 8.9 - 28.3 mg/dL   Total Protein 8.2 (H) 6.5 - 8.1 g/dL   Albumin 4.6 3.5 - 5.0 g/dL   AST 31 15 - 41 U/L   ALT 11 0 - 44 U/L   Alkaline Phosphatase 123 38 - 126 U/L   Total Bilirubin 3.7 (H) <1.2 mg/dL   GFR, Estimated >15 >17 mL/min    Comment: (NOTE) Calculated using the CKD-EPI Creatinine Equation (2021)    Anion gap 9 5 -  15    Comment: Performed at [facility], [address] [place], [place] [address]  CBC with Differential     Status: Abnormal   Collection Time: 07/17/23  4:42 PM  Result Value Ref Range   WBC 13.8 (H) 4.0 - 10.5 K/uL   RBC 3.00 (L) 4.22 - 5.81 MIL/uL   Hemoglobin 10.2 (L) 13.0 - 17.0 g/dL   HCT 62.1 (L) 30.8 - 65.7 %   MCV 97.7 80.0 - 100.0 fL   MCH 34.0 26.0 - 34.0 pg   MCHC 34.8 30.0 - 36.0 g/dL   RDW 84.6 (H) 96.2 - 95.2 %   Platelets 173 150 - 400 K/uL   nRBC 6.1 (H) 0.0 - 0.2 %   Neutrophils Relative % 70 %   Neutro Abs 9.6 (H) 1.7 - 7.7 K/uL   Lymphocytes Relative 16 %   Lymphs Abs 2.3 0.7 - 4.0 K/uL   Monocytes Relative 7 %   Monocytes Absolute 1.0 0.1 - 1.0 K/uL   Eosinophils Relative 3 %   Eosinophils Absolute 0.4 0.0 - 0.5 K/uL   Basophils Relative 1 %   Basophils Absolute 0.1 0.0 - 0.1 K/uL   Immature Granulocytes 3 %   Abs Immature Granulocytes 0.38 (H) 0.00 - 0.07 K/uL   Polychromasia PRESENT    Sickle Cells PRESENT    Target Cells PRESENT     Comment: Performed at [facility], [address], [place], [place] [address]  Reticulocytes     Status: Abnormal   Collection Time: 07/17/23  4:42 PM  Result Value Ref Range   Retic Ct Pct 9.6 (H) 0.4 - 3.1 %   RBC. 3.00 (L) 4.22 - 5.81 MIL/uL   Retic Count, Absolute 289.2 (H) 19.0 - 186.0 K/uL   Immature Retic Fract 40.4 (H) 2.3 - 15.9 %    Comment: Performed at [facility], [address]  [place], [place] [address]  Urinalysis, w/ Reflex to Culture (Infection Suspected) -Urine, Clean Catch     Status: None   Collection Time: 07/17/23  7:35 PM  Result Value Ref Range   Specimen Source URINE, CLEAN CATCH    Color, Urine YELLOW YELLOW   APPearance CLEAR CLEAR   Specific Gravity, Urine 1.011 1.005 - 1.030   pH 6.0 5.0 - 8.0   Glucose, UA NEGATIVE NEGATIVE mg/dL   Hgb urine dipstick NEGATIVE NEGATIVE   Bilirubin Urine NEGATIVE NEGATIVE   Ketones, ur NEGATIVE NEGATIVE mg/dL   Protein, ur NEGATIVE NEGATIVE mg/dL   Nitrite NEGATIVE NEGATIVE   Leukocytes,Ua NEGATIVE NEGATIVE   RBC / HPF 0-5 0 - 5 RBC/hpf   WBC, UA 0-5 0 - 5 WBC/hpf    Comment:        Reflex urine culture not performed if WBC <=10, OR if Squamous epithelial cells >5. If Squamous epithelial cells >5 suggest recollection.    Bacteria, UA NONE SEEN NONE SEEN   Squamous Epithelial / HPF 0-5 0 - 5 /HPF    Comment: Performed at [facility], [address], [place], [place] [address]   No results found.  Pending Labs Unresulted Labs (From admission, onward)     Start     Ordered   07/18/23 0515  HIV Antibody (routine testing w rflx)  (HIV Antibody (Routine testing w reflex) panel)  Once,   R        07/18/23 0516   07/18/23 0513  CBC  Daily,   R  07/18/23 0512   07/18/23 0513  Comprehensive metabolic panel  Daily,   R      07/18/23 0512   07/18/23 0513  Bilirubin, direct  Add-on,   AD        07/18/23 0512            Vitals/Pain Today's Vitals   07/18/23 0437 07/18/23 0440  BP:  (!) 160/87  Pulse:  97  Resp:  15  Temp:  98.4 F (36.9 C)  SpO2:  94%  PainSc: 10-Worst pain ever     Isolation Precautions No active isolations  Medications Medications  ondansetron (ZOFRAN) injection 4 mg (4 mg Intravenous Given 07/18/23 0505)  lactated ringers bolus 1,000 mL (has no administration in time range)  lactated ringers infusion (has no administration in time range)  naloxone  [facility]) injection 0.4 mg (has no administration in time range)    And  sodium chloride flush (NS) 0.9 % injection 9 mL (has no administration in time range)  HYDROmorphone (DILAUDID) 1 mg/mL PCA injection (has no administration in time range)  diphenhydrAMINE (BENADRYL) injection 12.5 mg (has no administration in time range)    Or  diphenhydrAMINE (BENADRYL) 12.5 MG/5ML elixir 12.5 mg (has no administration in time range)  aspirin EC tablet 81 mg (has no administration in time range)  hydroxyurea (HYDREA) capsule 1,000 mg (has no administration in time range)  folic acid (FOLVITE) tablet 1 mg (has no administration in time range)  enoxaparin (LOVENOX) injection 40 mg (has no administration in time range)  sodium chloride flush (NS) 0.9 % injection 3 mL (has no administration in time range)  sodium chloride flush (NS) 0.9 % injection 3 mL (has no administration in time range)  sodium chloride flush (NS) 0.9 % injection 3 mL (has no administration in time range)  0.9 %  sodium chloride infusion (has no administration in time range)  ibuprofen (ADVIL) tablet 400 mg (has no administration in time range)  docusate sodium (COLACE) capsule 100 mg (has no administration in time range)  polyethylene glycol (MIRALAX / GLYCOLAX) packet 17 g (has no administration in time range)  ketorolac (TORADOL) 15 MG/ML injection 15 mg (15 mg Intravenous Given 07/18/23 0505)  HYDROmorphone (DILAUDID) injection 1 mg (1 mg Intravenous Given 07/18/23 0505)    Mobility walks

## 2023-07-19 DIAGNOSIS — Z825 Family history of asthma and other chronic lower respiratory diseases: Secondary | ICD-10-CM | POA: Diagnosis not present

## 2023-07-19 DIAGNOSIS — Z8249 Family history of ischemic heart disease and other diseases of the circulatory system: Secondary | ICD-10-CM | POA: Diagnosis not present

## 2023-07-19 DIAGNOSIS — J45909 Unspecified asthma, uncomplicated: Secondary | ICD-10-CM | POA: Diagnosis present

## 2023-07-19 DIAGNOSIS — Z888 Allergy status to other drugs, medicaments and biological substances status: Secondary | ICD-10-CM | POA: Diagnosis not present

## 2023-07-19 DIAGNOSIS — R7402 Elevation of levels of lactic acid dehydrogenase (LDH): Secondary | ICD-10-CM | POA: Diagnosis present

## 2023-07-19 DIAGNOSIS — M62830 Muscle spasm of back: Secondary | ICD-10-CM | POA: Diagnosis not present

## 2023-07-19 DIAGNOSIS — Z9081 Acquired absence of spleen: Secondary | ICD-10-CM | POA: Diagnosis not present

## 2023-07-19 DIAGNOSIS — G894 Chronic pain syndrome: Secondary | ICD-10-CM | POA: Diagnosis present

## 2023-07-19 DIAGNOSIS — D72829 Elevated white blood cell count, unspecified: Secondary | ICD-10-CM | POA: Diagnosis present

## 2023-07-19 DIAGNOSIS — R509 Fever, unspecified: Secondary | ICD-10-CM | POA: Diagnosis not present

## 2023-07-19 DIAGNOSIS — R030 Elevated blood-pressure reading, without diagnosis of hypertension: Secondary | ICD-10-CM | POA: Diagnosis present

## 2023-07-19 DIAGNOSIS — F1120 Opioid dependence, uncomplicated: Secondary | ICD-10-CM | POA: Diagnosis present

## 2023-07-19 DIAGNOSIS — Z96642 Presence of left artificial hip joint: Secondary | ICD-10-CM | POA: Diagnosis present

## 2023-07-19 DIAGNOSIS — R000 Tachycardia, unspecified: Secondary | ICD-10-CM | POA: Diagnosis present

## 2023-07-19 DIAGNOSIS — M549 Dorsalgia, unspecified: Secondary | ICD-10-CM | POA: Diagnosis present

## 2023-07-19 DIAGNOSIS — Z79899 Other long term (current) drug therapy: Secondary | ICD-10-CM | POA: Diagnosis not present

## 2023-07-19 DIAGNOSIS — D5700 Hb-SS disease with crisis, unspecified: Secondary | ICD-10-CM | POA: Diagnosis present

## 2023-07-19 LAB — COMPREHENSIVE METABOLIC PANEL
ALT: 21 U/L (ref 0–44)
AST: 46 U/L — ABNORMAL HIGH (ref 15–41)
Albumin: 3.9 g/dL (ref 3.5–5.0)
Alkaline Phosphatase: 163 U/L — ABNORMAL HIGH (ref 38–126)
Anion gap: 8 (ref 5–15)
BUN: 11 mg/dL (ref 6–20)
CO2: 24 mmol/L (ref 22–32)
Calcium: 8.7 mg/dL — ABNORMAL LOW (ref 8.9–10.3)
Chloride: 101 mmol/L (ref 98–111)
Creatinine, Ser: 0.72 mg/dL (ref 0.61–1.24)
GFR, Estimated: >60 mL/min (ref >60)
Glucose, Bld: 77 mg/dL (ref 70–99)
Potassium: 3.7 mmol/L (ref 3.5–5.1)
Sodium: 133 mmol/L — ABNORMAL LOW (ref 135–145)
Total Bilirubin: 3 mg/dL — ABNORMAL HIGH (ref <1.2)
Total Protein: 7.1 g/dL (ref 6.5–8.1)

## 2023-07-19 LAB — CBC
HCT: 24.1 % — ABNORMAL LOW (ref 39.0–52.0)
Hemoglobin: 8.3 g/dL — ABNORMAL LOW (ref 13.0–17.0)
MCH: 33.5 pg (ref 26.0–34.0)
MCHC: 34.4 g/dL (ref 30.0–36.0)
MCV: 97.2 fL (ref 80.0–100.0)
Platelets: 173 10*3/uL (ref 150–400)
RBC: 2.48 MIL/uL — ABNORMAL LOW (ref 4.22–5.81)
RDW: 19.5 % — ABNORMAL HIGH (ref 11.5–15.5)
WBC: 11.3 10*3/uL — ABNORMAL HIGH (ref 4.0–10.5)
nRBC: 7.8 % — ABNORMAL HIGH (ref 0.0–0.2)

## 2023-07-19 LAB — HAPTOGLOBIN: Haptoglobin: <10 mg/dL — ABNORMAL LOW (ref 17–317)

## 2023-07-19 NOTE — Progress Notes (Signed)
Patient ID: Jonathan [name], male   DOB: May 03, 2001, 22 y.o.   MRN: [id number] Subjective: Jonathan [name] is a 22 y.o. male with medical history significant of Sickle cell Disease, chronic pain syndrome, opiate dependence and tolerance, and anemia of chronic disease who was admitted earlier this morning for sickle cell pain crisis.  Patient cell he feels much better today although still has significant pain in his lower extremities.  He is planning to ambulate on the hallway today for possible discharge home tomorrow.  He has no new complaint today.  He denies any fever, cough, chest pain, shortness of breath, nausea, vomit or diarrhea.  No urinary symptoms.  Objective:  Vital signs in last 24 hours:  Vitals:   07/19/23 0537 07/19/23 0858 07/19/23 0908 07/19/23 1224  BP: 128/76  135/80   Pulse: 85  91   Resp: 18 18 20 16   Temp: 98.1 F (36.7 C)  98.9 F (37.2 C)   TempSrc: Oral  Oral   SpO2: 96% 95% 96% 96%    Intake/Output from previous day:   Intake/Output Summary (Last 24 hours) at 07/19/2023 1534 Last data filed at 07/18/2023 1700 Gross per 24 hour  Intake --  Output 600 ml  Net -600 ml    Physical Exam: General: Alert, awake, oriented x3, in no acute distress.  HEENT: [place]/AT PEERL, EOMI Neck: Trachea midline,  no masses, no thyromegal,y no JVD, no carotid bruit OROPHARYNX:  Moist, No exudate/ erythema/lesions.  Heart: Regular rate and rhythm, without murmurs, rubs, gallops, PMI non-displaced, no heaves or thrills on palpation.  Lungs: Clear to auscultation, no wheezing or rhonchi noted. No increased vocal fremitus resonant to percussion  Abdomen: Soft, nontender, nondistended, positive bowel sounds, no masses no hepatosplenomegaly noted..  Neuro: No focal neurological deficits noted cranial nerves II through XII grossly intact. DTRs 2+ bilaterally upper and lower extremities. Strength 5 out of 5 in bilateral upper and lower extremities. Musculoskeletal: No warm swelling  or erythema around joints, no spinal tenderness noted. Psychiatric: Patient alert and oriented x3, good insight and cognition, good recent to remote recall. Lymph node survey: No cervical axillary or inguinal lymphadenopathy noted.  Lab Results:  Basic Metabolic Panel:    Component Value Date/Time   NA 133 (L) 07/19/2023 0554   NA 140 06/09/2023 1018   K 3.7 07/19/2023 0554   CL 101 07/19/2023 0554   CO2 24 07/19/2023 0554   BUN 11 07/19/2023 0554   BUN 7 06/09/2023 1018   CREATININE 0.72 07/19/2023 0554   CREATININE 0.70 05/12/2016 1052   GLUCOSE 77 07/19/2023 0554   CALCIUM 8.7 (L) 07/19/2023 0554   CBC:    Component Value Date/Time   WBC 11.3 (H) 07/19/2023 0554   HGB 8.3 (L) 07/19/2023 0554   HGB CANCELED 06/09/2023 1018   HCT 24.1 (L) 07/19/2023 0554   HCT CANCELED 06/09/2023 1018   PLT 173 07/19/2023 0554   PLT CANCELED 06/09/2023 1018   MCV 97.2 07/19/2023 0554   MCV CANCELED 06/09/2023 1018   NEUTROABS 9.6 (H) 07/17/2023 1642   NEUTROABS CANCELED 06/09/2023 1018   LYMPHSABS 2.3 07/17/2023 1642   LYMPHSABS CANCELED 06/09/2023 1018   MONOABS 1.0 07/17/2023 1642   EOSABS 0.4 07/17/2023 1642   EOSABS CANCELED 06/09/2023 1018   BASOSABS 0.1 07/17/2023 1642   BASOSABS CANCELED 06/09/2023 1018    No results found for this or any previous visit (from the past 240 hour(s)).  Studies/Results: No results found.  Medications: Scheduled Meds:  aspirin EC  81 mg Oral Daily   docusate sodium  100 mg Oral BID   enoxaparin (LOVENOX) injection  40 mg Subcutaneous Q24H   folic acid  1 mg Oral Daily   HYDROmorphone   Intravenous Q4H   hydroxyurea  1,000 mg Oral Daily   sodium chloride flush  3 mL Intravenous Q12H   Continuous Infusions: PRN Meds:.diphenhydrAMINE, naloxone **AND** sodium chloride flush, ondansetron, oxyCODONE, polyethylene glycol  Consultants: None  Procedures: None  Antibiotics: None  Assessment/Plan: Active Problems:   Sickle cell pain  crisis (HCC)   History of sickle cell disease   Bilirubinemia   Elevated blood pressure reading   Leukocytosis  Hb Sickle Cell Disease with Pain crisis: IV fluid to KVO.  Continue weight based Dilaudid PCA at current dose setting, continue IV Toradol 15 mg Q 6 H.  Continue oral home pain medications. Monitor vitals very closely, Re-evaluate pain scale regularly, 2 L of Oxygen by [place]. Leukocytosis: Mild at 11.3 today.  There is no evidence of infection or inflammation.  This is most likely reactive.  Will continue to monitor very closely without antibiotics. Anemia of Chronic Disease: Hemoglobin continues to be stable at baseline.  There is no clinical indication for blood transfusion at this time. Will monitor closely and transfuse as appropriate. Chronic pain Syndrome: Will continue oral home pain medications. Elevated blood pressure: Blood pressure is better controlled today.  Elevated blood pressure is most likely reactive to pain crisis. Please do not use beta-blocker to control blood pressure. Focus on adequate pain control.  Code Status: Full Code Family Communication: N/A Disposition Plan: For possible discharge home tomorrow - 07/20/2023.  Santasia Rew  If 7PM-7AM, please contact night-coverage.  07/19/2023, 3:34 PM  LOS: 0 days

## 2023-07-19 NOTE — Plan of Care (Signed)

## 2023-07-19 NOTE — Progress Notes (Signed)
Mobility Specialist - Progress Note   07/19/23 1404  Mobility  Activity Ambulated independently in hallway  Level of Assistance Independent  Assistive Device None  Distance Ambulated (ft) 1000 ft  Activity Response Tolerated well  Mobility Referral Yes  $Mobility charge 1 Mobility  Mobility Specialist Start Time (ACUTE ONLY) 1349  Mobility Specialist Stop Time (ACUTE ONLY) 1400  Mobility Specialist Time Calculation (min) (ACUTE ONLY) 11 min   Pt received in bed and agreeable to mobility. No complaints during session. Pt to hallway independently  after session with all needs met.    [facility]

## 2023-07-20 ENCOUNTER — Inpatient Hospital Stay (HOSPITAL_COMMUNITY): Payer: 59

## 2023-07-20 ENCOUNTER — Encounter: Payer: Self-pay | Admitting: Family Medicine

## 2023-07-20 DIAGNOSIS — D5700 Hb-SS disease with crisis, unspecified: Secondary | ICD-10-CM | POA: Diagnosis not present

## 2023-07-20 LAB — LACTATE DEHYDROGENASE: LDH: 507 U/L — ABNORMAL HIGH (ref 98–192)

## 2023-07-20 LAB — CBC WITH DIFFERENTIAL/PLATELET
Abs Immature Granulocytes: 0.14 10*3/uL — ABNORMAL HIGH (ref 0.00–0.07)
Basophils Absolute: 0 10*3/uL (ref 0.0–0.1)
Basophils Relative: 0 %
Eosinophils Absolute: 0.1 10*3/uL (ref 0.0–0.5)
Eosinophils Relative: 0 %
HCT: 14.5 % — ABNORMAL LOW (ref 39.0–52.0)
Hemoglobin: 5 g/dL — CL (ref 13.0–17.0)
Immature Granulocytes: 1 %
Lymphocytes Relative: 10 %
Lymphs Abs: 1.5 10*3/uL (ref 0.7–4.0)
MCH: 32.9 pg (ref 26.0–34.0)
MCHC: 34.5 g/dL (ref 30.0–36.0)
MCV: 95.4 fL (ref 80.0–100.0)
Monocytes Absolute: 1.7 10*3/uL — ABNORMAL HIGH (ref 0.1–1.0)
Monocytes Relative: 12 %
Neutro Abs: 11.5 10*3/uL — ABNORMAL HIGH (ref 1.7–7.7)
Neutrophils Relative %: 77 %
Platelets: 231 10*3/uL (ref 150–400)
RBC: 1.52 MIL/uL — ABNORMAL LOW (ref 4.22–5.81)
RDW: 18.9 % — ABNORMAL HIGH (ref 11.5–15.5)
WBC: 14.9 10*3/uL — ABNORMAL HIGH (ref 4.0–10.5)
nRBC: 3.2 % — ABNORMAL HIGH (ref 0.0–0.2)

## 2023-07-20 LAB — LACTIC ACID, PLASMA
Lactic Acid, Venous: 1 mmol/L (ref 0.5–1.9)
Lactic Acid, Venous: 1 mmol/L (ref 0.5–1.9)

## 2023-07-20 LAB — PREPARE RBC (CROSSMATCH)

## 2023-07-20 MED ORDER — OXYCODONE HCL 5 MG PO TABS: 10.0000 mg | ORAL_TABLET | ORAL | Status: DC | PRN

## 2023-07-20 MED ORDER — CYCLOBENZAPRINE HCL 5 MG PO TABS
5.0000 mg | ORAL_TABLET | Freq: Once | ORAL | Status: AC
  Administered 2023-07-20: 5 mg via ORAL
  Filled 2023-07-20: qty 1

## 2023-07-20 MED ORDER — ACETAMINOPHEN 325 MG PO TABS
650.0000 mg | ORAL_TABLET | Freq: Four times a day (QID) | ORAL | Status: AC | PRN
  Administered 2023-07-20 (×2): 650 mg via ORAL
  Filled 2023-07-20 (×2): qty 2

## 2023-07-20 MED ORDER — HYDROMORPHONE HCL 1 MG/ML IJ SOLN
1.0000 mg | Freq: Four times a day (QID) | INTRAMUSCULAR | Status: DC | PRN
  Administered 2023-07-21: 1 mg via INTRAVENOUS
  Filled 2023-07-20: qty 1

## 2023-07-20 MED ORDER — OXYCODONE HCL 5 MG PO TABS
10.0000 mg | ORAL_TABLET | ORAL | Status: DC
  Administered 2023-07-20: 10 mg via ORAL
  Filled 2023-07-20: qty 2

## 2023-07-20 MED ORDER — OXYCODONE HCL 5 MG PO TABS
10.0000 mg | ORAL_TABLET | ORAL | Status: DC | PRN
  Administered 2023-07-20 – 2023-07-22 (×6): 10 mg via ORAL
  Filled 2023-07-20 (×6): qty 2

## 2023-07-20 MED ORDER — SODIUM CHLORIDE 0.9% IV SOLUTION: Freq: Once | INTRAVENOUS | Status: AC

## 2023-07-20 MED ORDER — ACETAMINOPHEN 325 MG PO TABS
650.0000 mg | ORAL_TABLET | Freq: Once | ORAL | Status: AC
  Administered 2023-07-20: 650 mg via ORAL
  Filled 2023-07-20: qty 2

## 2023-07-20 MED ORDER — CYCLOBENZAPRINE HCL 5 MG PO TABS
5.0000 mg | ORAL_TABLET | Freq: Three times a day (TID) | ORAL | Status: DC | PRN
  Administered 2023-07-20 – 2023-07-22 (×5): 5 mg via ORAL
  Filled 2023-07-20 (×5): qty 1

## 2023-07-20 MED ORDER — HYDROMORPHONE 1 MG/ML IV SOLN: INTRAVENOUS | Status: DC

## 2023-07-20 MED ORDER — CYCLOBENZAPRINE HCL 5 MG PO TABS: 5.0000 mg | ORAL_TABLET | Freq: Three times a day (TID) | ORAL | 0 refills | Status: DC | PRN

## 2023-07-20 NOTE — Progress Notes (Addendum)
Subjective: Jonathan [name] is a 22 year old male with a medical history significant for sickle cell disease, chronic pain syndrome, opiate dependence and tolerance, and history of anemia of chronic disease was admitted for sickle cell pain crisis. Patient reports low back spasms overnight.  He says that pain intensity to low back and lower extremity has improved.  He rates his pain at 4/10.  Patient was requesting discharge home, however patient tachycardic heart rate 120s-130s and current temperature 100.6 F. Patient denies any headache, dizziness, fatigue, chest pain, shortness of breath, urinary symptoms, nausea, vomiting, or diarrhea.  Objective:  Vital signs in last 24 hours:  Vitals:   07/20/23 0534 07/20/23 1203 07/20/23 1600 07/20/23 1700  BP:  (!) 153/83 (!) 150/81 (!) 141/82  Pulse:  (!) 114 (!) 125 (!) 117  Resp: 15   16  Temp:  98.8 F (37.1 C) (!) 100.6 F (38.1 C) 99.9 F (37.7 C)  TempSrc:  Oral Oral Oral  SpO2: 96% 95% 92% 91%    Intake/Output from previous day:   Intake/Output Summary (Last 24 hours) at 07/20/2023 1720 Last data filed at 07/20/2023 0500 Gross per 24 hour  Intake --  Output 900 ml  Net -900 ml    Physical Exam: General: Alert, awake, oriented x3, in no acute distress.  HEENT: [place]/AT PEERL, EOMI Neck: Trachea midline,  no masses, no thyromegal,y no JVD, no carotid bruit OROPHARYNX:  Moist, No exudate/ erythema/lesions.  Heart: Regular rate and rhythm, without murmurs, rubs, gallops, PMI non-displaced, no heaves or thrills on palpation.  Lungs: Clear to auscultation, no wheezing or rhonchi noted. No increased vocal fremitus resonant to percussion  Abdomen: Soft, nontender, nondistended, positive bowel sounds, no masses no hepatosplenomegaly noted..  Neuro: No focal neurological deficits noted cranial nerves II through XII grossly intact. DTRs 2+ bilaterally upper and lower extremities. Strength 5 out of 5 in bilateral upper and lower  extremities. Musculoskeletal: No warm swelling or erythema around joints, no spinal tenderness noted. Psychiatric: Patient alert and oriented x3, good insight and cognition, good recent to remote recall. Lymph node survey: No cervical axillary or inguinal lymphadenopathy noted.  Lab Results:  Basic Metabolic Panel:    Component Value Date/Time   NA 133 (L) 07/19/2023 0554   NA 140 06/09/2023 1018   K 3.7 07/19/2023 0554   CL 101 07/19/2023 0554   CO2 24 07/19/2023 0554   BUN 11 07/19/2023 0554   BUN 7 06/09/2023 1018   CREATININE 0.72 07/19/2023 0554   CREATININE 0.70 05/12/2016 1052   GLUCOSE 77 07/19/2023 0554   CALCIUM 8.7 (L) 07/19/2023 0554   CBC:    Component Value Date/Time   WBC 14.9 (H) 07/20/2023 1631   HGB 5.0 (LL) 07/20/2023 1631   HGB CANCELED 06/09/2023 1018   HCT 14.5 (L) 07/20/2023 1631   HCT CANCELED 06/09/2023 1018   PLT 231 07/20/2023 1631   PLT CANCELED 06/09/2023 1018   MCV 95.4 07/20/2023 1631   MCV CANCELED 06/09/2023 1018   NEUTROABS 11.5 (H) 07/20/2023 1631   NEUTROABS CANCELED 06/09/2023 1018   LYMPHSABS 1.5 07/20/2023 1631   LYMPHSABS CANCELED 06/09/2023 1018   MONOABS 1.7 (H) 07/20/2023 1631   EOSABS 0.1 07/20/2023 1631   EOSABS CANCELED 06/09/2023 1018   BASOSABS 0.0 07/20/2023 1631   BASOSABS CANCELED 06/09/2023 1018    No results found for this or any previous visit (from the past 240 hour(s)).  Studies/Results: DG CHEST PORT 1 VIEW  Result Date: 07/20/2023 CLINICAL DATA:  Sickle  cell pain, crisis EXAM: PORTABLE CHEST 1 VIEW COMPARISON:  02/09/2023 FINDINGS: Low lung volumes with bibasilar opacities. No effusions or pneumothorax. Heart is borderline in size. Mediastinal contours are within normal limits. No acute bony abnormality. IMPRESSION: Low lung volumes with bibasilar atelectasis or infiltrates. Electronically Signed   By: Charlett Nose M.D.   On: 07/20/2023 16:58    Medications: Scheduled Meds:  sodium chloride   Intravenous  Once   acetaminophen  650 mg Oral Once   aspirin EC  81 mg Oral Daily   docusate sodium  100 mg Oral BID   enoxaparin (LOVENOX) injection  40 mg Subcutaneous Q24H   folic acid  1 mg Oral Daily   hydroxyurea  1,000 mg Oral Daily   sodium chloride flush  3 mL Intravenous Q12H   Continuous Infusions: PRN Meds:.cyclobenzaprine, diphenhydrAMINE, HYDROmorphone (DILAUDID) injection, naloxone **AND** sodium chloride flush, ondansetron, oxyCODONE, polyethylene glycol  Consultants: none  Procedures: none  Antibiotics: none  Assessment/Plan: Active Problems:   Sickle cell pain crisis (HCC)   History of sickle cell disease   Bilirubinemia   Elevated blood pressure reading   Leukocytosis  Fever of unknown origin: Max temperature 100.6.  Follow blood cultures.  Lactic acid negative.  Chest x-ray pending.  Urinalysis pending.  No antibiotics at this time.  Continue to monitor closely.  Sickle cell disease with pain crisis: The patient's hemoglobin is decreased to 5 g/dL.  LDH elevated at 561.  Transfuse 2 units PRBCs today. Continue folic acid 1 mg Hold hydroxyurea Pain management with Dilaudid 1 mg IV every 6 hours as needed Continue home medications.  Oxycodone 10 mg every 4 hours as needed for severe breakthrough pain Monitor vital signs closely, reevaluate pain scale regularly, and supplemental oxygen as needed  Chronic pain syndrome: Continue home medications  Leukocytosis: Mild leukocytosis.  WBCs 14.3.  More than likely secondary to sickle cell crisis.  Continue to monitor closely.  Labs in AM.  No antibiotics at this time.    Code Status: Full Code Family Communication: N/A Disposition Plan: Not yet ready for discharge  Yechezkel Fertig Rennis Petty  APRN, MSN, FNP-C [facility] [address] [place], [place] [address] [phone]  If 7PM-7AM, please contact night-coverage.  07/20/2023, 5:20 PM  LOS: 1 day

## 2023-07-20 NOTE — Progress Notes (Signed)
PHARMACIST - PHYSICIAN ORDER COMMUNICATION  CONCERNING: P&T Medication Policy on Oral Chemo Medications  DESCRIPTION:  This patient's order for Hydroxyurea has been noted.  Hydroxyurea from home was resumed 11/23, but today, pharmacy is holding this med according to the oral chemotherapy policy. Patient met one of the following hold medication-specific conditions.   ANC < 2K Pltc < 80K  Hgb <= 6 g/dL  - Hgb decreased to 5.0 (11/25) Reticulocytes < 80K when Hgb < 9 g/dL  ACTION TAKEN: Hydroxyurea order was d/c Please reassess and resume when appropriate.    Lynann Beaver PharmD, BCPS [facility] main pharmacy [phone] 07/20/2023 7:24 PM

## 2023-07-20 NOTE — Plan of Care (Deleted)
  Problem: Education: Goal: Knowledge of vaso-occlusive preventative measures will improve Outcome: Adequate for Discharge Goal: Awareness of infection prevention will improve Outcome: Adequate for Discharge Goal: Awareness of signs and symptoms of anemia will improve Outcome: Adequate for Discharge Goal: Long-term complications will improve Outcome: Adequate for Discharge   Problem: Self-Care: Goal: Ability to incorporate actions that prevent/reduce pain crisis will improve Outcome: Adequate for Discharge   Problem: Bowel/Gastric: Goal: Gut motility will be maintained Outcome: Adequate for Discharge   Problem: Tissue Perfusion: Goal: Complications related to inadequate tissue perfusion will be avoided or minimized Outcome: Adequate for Discharge   Problem: Respiratory: Goal: Pulmonary complications will be avoided or minimized Outcome: Adequate for Discharge Goal: Acute Chest Syndrome will be identified early to prevent complications Outcome: Adequate for Discharge   Problem: Fluid Volume: Goal: Ability to maintain a balanced intake and output will improve Outcome: Adequate for Discharge   Problem: Sensory: Goal: Pain level will decrease with appropriate interventions Outcome: Adequate for Discharge   Problem: Health Behavior: Goal: Postive changes in compliance with treatment and prescription regimens will improve Outcome: Adequate for Discharge   Problem: Health Behavior/Discharge Planning: Goal: Ability to manage health-related needs will improve Outcome: Adequate for Discharge   Problem: Clinical Measurements: Goal: Ability to maintain clinical measurements within normal limits will improve Outcome: Adequate for Discharge Goal: Will remain free from infection Outcome: Adequate for Discharge Goal: Diagnostic test results will improve Outcome: Adequate for Discharge Goal: Respiratory complications will improve Outcome: Adequate for Discharge Goal:  Cardiovascular complication will be avoided Outcome: Adequate for Discharge Pt A&OX4- IV removed, AVS reviewed follow up questions answered, belongings returned. BP (!) 153/83   Pulse (!) 114   Temp 98.8 F (37.1 C) (Oral)   Resp 15   SpO2 95%  NO other needs voiced at this time. Jonathan [name] 07/20/23 3:21 PM

## 2023-07-20 NOTE — Progress Notes (Signed)
   07/20/23 0224  Assess: MEWS Score  Temp (!) 101.8 F (38.8 C)  BP (!) 147/84  MAP (mmHg) 100  Resp 18  SpO2 99 %  O2 Device Nasal Cannula  Assess: MEWS Score  MEWS Temp 2  MEWS Systolic 0  MEWS Pulse 1  MEWS RR 0  MEWS LOC 0  MEWS Score 3  MEWS Score Color Yellow  Assess: if the MEWS score is Yellow or Red  Were vital signs accurate and taken at a resting state? Yes  Does the patient meet 2 or more of the SIRS criteria? Yes  Does the patient have a confirmed or suspected source of infection? No  MEWS guidelines implemented  Yes, yellow  Treat  MEWS Interventions Considered administering scheduled or prn medications/treatments as ordered  Take Vital Signs  Increase Vital Sign Frequency  Yellow: Q2hr x1, continue Q4hrs until patient remains green for 12hrs  Escalate  MEWS: Escalate Yellow: Discuss with charge nurse and consider notifying provider and/or RRT  Notify: Charge Nurse/RN  Name of Charge Nurse/RN Notified Luna Glasgow RN  Provider Notification  Provider Name/Title J. Garner Nash NP  Date Provider Notified 07/20/23  Time Provider Notified 0226  Method of Notification Page  Notification Reason Other (Comment) (yellow mews, fever)  Provider response See new orders  Date of Provider Response 07/20/23  Time of Provider Response 0233  Assess: SIRS CRITERIA  SIRS Temperature  1  SIRS Pulse 1  SIRS Respirations  0  SIRS WBC 0  SIRS Score Sum  2   Pt with new fever, no complaints other than his sickle cell pain. Notified on call provider and received order for prn tylenol. Gave this and instructed pt on use of IS. He is going to try to walk in the hall once the fever has subsided. Continue to monitor. Mick Sell RN

## 2023-07-21 DIAGNOSIS — D5700 Hb-SS disease with crisis, unspecified: Secondary | ICD-10-CM | POA: Diagnosis not present

## 2023-07-21 LAB — HEMOGLOBIN AND HEMATOCRIT, BLOOD
HCT: 26.9 % — ABNORMAL LOW (ref 39.0–52.0)
Hemoglobin: 9.3 g/dL — ABNORMAL LOW (ref 13.0–17.0)

## 2023-07-21 MED ORDER — HYDROMORPHONE 1 MG/ML IV SOLN
INTRAVENOUS | Status: DC
  Administered 2023-07-21: 3 mg via INTRAVENOUS
  Administered 2023-07-21: 30 mg via INTRAVENOUS
  Administered 2023-07-22: 3.5 mg via INTRAVENOUS
  Administered 2023-07-22: 3 mg via INTRAVENOUS
  Administered 2023-07-22: 5 mg via INTRAVENOUS
  Filled 2023-07-21: qty 30

## 2023-07-21 NOTE — Progress Notes (Signed)
Subjective: Jonathan [name] is a 22 year old male with a medical history significant for sickle cell disease, chronic pain syndrome, opiate dependence and tolerance, and history of anemia of chronic disease was admitted for sickle cell pain crisis.  Patient complaining of pain primarily to his low back.  He states that pain intensity increases with activity.  He rates pain as 5/10.  Patient denies any headache, dizziness, fatigue, chest pain, shortness of breath, urinary symptoms, nausea, vomiting, or diarrhea.  Objective:  Vital signs in last 24 hours:  Vitals:   07/21/23 1000 07/21/23 1256 07/21/23 1643 07/21/23 1712  BP: (!) 144/81 131/81 (!) 151/85   Pulse: (!) 110 (!) 108 (!) 109   Resp: 17 17 17 18   Temp: 100.3 F (37.9 C) 98.5 F (36.9 C) 99.2 F (37.3 C)   TempSrc: Oral Oral Oral   SpO2: 90% 95% 98% 98%    Intake/Output from previous day:   Intake/Output Summary (Last 24 hours) at 07/21/2023 1748 Last data filed at 07/21/2023 1651 Gross per 24 hour  Intake 1465 ml  Output 1450 ml  Net 15 ml    Physical Exam: General: Alert, awake, oriented x3, in no acute distress.  HEENT: [place]/AT PEERL, EOMI Neck: Trachea midline,  no masses, no thyromegal,y no JVD, no carotid bruit OROPHARYNX:  Moist, No exudate/ erythema/lesions.  Heart: Regular rate and rhythm, without murmurs, rubs, gallops, PMI non-displaced, no heaves or thrills on palpation.  Lungs: Clear to auscultation, no wheezing or rhonchi noted. No increased vocal fremitus resonant to percussion  Abdomen: Soft, nontender, nondistended, positive bowel sounds, no masses no hepatosplenomegaly noted..  Neuro: No focal neurological deficits noted cranial nerves II through XII grossly intact. DTRs 2+ bilaterally upper and lower extremities. Strength 5 out of 5 in bilateral upper and lower extremities. Musculoskeletal: No warm swelling or erythema around joints, no spinal tenderness noted. Psychiatric: Patient alert and  oriented x3, good insight and cognition, good recent to remote recall. Lymph node survey: No cervical axillary or inguinal lymphadenopathy noted.  Lab Results:  Basic Metabolic Panel:    Component Value Date/Time   NA 133 (L) 07/19/2023 0554   NA 140 06/09/2023 1018   K 3.7 07/19/2023 0554   CL 101 07/19/2023 0554   CO2 24 07/19/2023 0554   BUN 11 07/19/2023 0554   BUN 7 06/09/2023 1018   CREATININE 0.72 07/19/2023 0554   CREATININE 0.70 05/12/2016 1052   GLUCOSE 77 07/19/2023 0554   CALCIUM 8.7 (L) 07/19/2023 0554   CBC:    Component Value Date/Time   WBC 14.9 (H) 07/20/2023 1631   HGB 9.3 (L) 07/21/2023 0544   HGB CANCELED 06/09/2023 1018   HCT 26.9 (L) 07/21/2023 0544   HCT CANCELED 06/09/2023 1018   PLT 231 07/20/2023 1631   PLT CANCELED 06/09/2023 1018   MCV 95.4 07/20/2023 1631   MCV CANCELED 06/09/2023 1018   NEUTROABS 11.5 (H) 07/20/2023 1631   NEUTROABS CANCELED 06/09/2023 1018   LYMPHSABS 1.5 07/20/2023 1631   LYMPHSABS CANCELED 06/09/2023 1018   MONOABS 1.7 (H) 07/20/2023 1631   EOSABS 0.1 07/20/2023 1631   EOSABS CANCELED 06/09/2023 1018   BASOSABS 0.0 07/20/2023 1631   BASOSABS CANCELED 06/09/2023 1018    Recent Results (from the past 240 hour(s))  Culture, blood (Routine X 2) w Reflex to ID Panel     Status: None (Preliminary result)   Collection Time: 07/20/23  4:21 PM   Specimen: BLOOD RIGHT ARM  Result Value Ref Range Status  Specimen Description   Final    BLOOD RIGHT ARM Performed at [facility], [address] [place], [place] [address]    Special Requests   Final    BOTTLES DRAWN AEROBIC ONLY Blood Culture adequate volume Performed at [facility], [address] [place], [place] [address]    Culture   Final    NO GROWTH < 24 HOURS Performed at [facility], [address], [place], [place] [address]    Report Status PENDING  Incomplete  Culture, blood (Routine X 2) w Reflex to ID Panel     Status:  None (Preliminary result)   Collection Time: 07/20/23  4:31 PM   Specimen: BLOOD LEFT ARM  Result Value Ref Range Status   Specimen Description   Final    BLOOD LEFT ARM Performed at [facility], [address] [place], [place] [address]    Special Requests   Final    BOTTLES DRAWN AEROBIC ONLY Blood Culture adequate volume Performed at [facility], [address] [place], [place] [address]    Culture   Final    NO GROWTH < 24 HOURS Performed at [facility], [address], [place], [place] [address]    Report Status PENDING  Incomplete    Studies/Results: DG CHEST PORT 1 VIEW  Result Date: 07/20/2023 CLINICAL DATA:  Sickle cell pain, crisis EXAM: PORTABLE CHEST 1 VIEW COMPARISON:  02/09/2023 FINDINGS: Low lung volumes with bibasilar opacities. No effusions or pneumothorax. Heart is borderline in size. Mediastinal contours are within normal limits. No acute bony abnormality. IMPRESSION: Low lung volumes with bibasilar atelectasis or infiltrates. Electronically Signed   By: Charlett Nose M.D.   On: 07/20/2023 16:58    Medications: Scheduled Meds:  aspirin EC  81 mg Oral Daily   docusate sodium  100 mg Oral BID   enoxaparin (LOVENOX) injection  40 mg Subcutaneous Q24H   folic acid  1 mg Oral Daily   HYDROmorphone   Intravenous Q4H   sodium chloride flush  3 mL Intravenous Q12H   Continuous Infusions: PRN Meds:.cyclobenzaprine, diphenhydrAMINE, naloxone **AND** sodium chloride flush, ondansetron, oxyCODONE, polyethylene glycol  Consultants: none  Procedures: none  Antibiotics: none  Assessment/Plan: Active Problems:   Sickle cell pain crisis (HCC)   History of sickle cell disease   Bilirubinemia   Elevated blood pressure reading   Leukocytosis  Fever of unknown origin: Resolved.  Patient afebrile overnight.  Blood cultures negative thus far.  Will continue to monitor without antibiotics.  Sickle cell disease with pain  crisis: Restart IV Dilaudid PCA with settings of 0.5 mg, 10-minute lockout, and 3 mg/h. Discontinue Dilaudid 1 mg every 6 hours Toradol 15 mg IV every 6 hours for total of 5 days Oxycodone 10 mg every 4 hours as needed for severe breakthrough pain.   Monitor vital signs closely, reevaluate pain scale regularly, and supplemental oxygen as needed  Anemia of chronic disease: Hemoglobin improved to 9.3 g/dL.  Patient is status post 2 units PRBCs on yesterday.  Monitor closely.  Labs in AM.  Chronic pain syndrome: Continue home medications  Leukocytosis: Stable.  More than likely secondary to sickle cell crisis.  Continue to monitor closely.  Labs in AM.  No antibiotics at this time.    Code Status: Full Code Family Communication: N/A Disposition Plan: Not yet ready for discharge  Rekisha Welling Rennis Petty  APRN, MSN, FNP-C [facility] [address] [place], [place] [address] [phone]  If 7PM-7AM, please contact night-coverage.  07/21/2023, 5:48 PM  LOS: 2 days

## 2023-07-22 ENCOUNTER — Encounter: Payer: Self-pay | Admitting: Family Medicine

## 2023-07-22 LAB — CBC
HCT: 27.9 % — ABNORMAL LOW (ref 39.0–52.0)
Hemoglobin: 9.3 g/dL — ABNORMAL LOW (ref 13.0–17.0)
MCH: 30.3 pg (ref 26.0–34.0)
MCHC: 33.3 g/dL (ref 30.0–36.0)
MCV: 90.9 fL (ref 80.0–100.0)
Platelets: 278 10*3/uL (ref 150–400)
RBC: 3.07 MIL/uL — ABNORMAL LOW (ref 4.22–5.81)
RDW: 18.6 % — ABNORMAL HIGH (ref 11.5–15.5)
WBC: 9.4 10*3/uL (ref 4.0–10.5)
nRBC: 4.5 % — ABNORMAL HIGH (ref 0.0–0.2)

## 2023-07-22 LAB — COMPREHENSIVE METABOLIC PANEL
ALT: 12 U/L (ref 0–44)
AST: 13 U/L — ABNORMAL LOW (ref 15–41)
Albumin: 3.4 g/dL — ABNORMAL LOW (ref 3.5–5.0)
Alkaline Phosphatase: 119 U/L (ref 38–126)
Anion gap: 12 (ref 5–15)
BUN: 12 mg/dL (ref 6–20)
CO2: 23 mmol/L (ref 22–32)
Calcium: 9 mg/dL (ref 8.9–10.3)
Chloride: 100 mmol/L (ref 98–111)
Creatinine, Ser: 0.72 mg/dL (ref 0.61–1.24)
GFR, Estimated: >60 mL/min (ref >60)
Glucose, Bld: 117 mg/dL — ABNORMAL HIGH (ref 70–99)
Potassium: 4 mmol/L (ref 3.5–5.1)
Sodium: 135 mmol/L (ref 135–145)
Total Bilirubin: 1.5 mg/dL — ABNORMAL HIGH (ref <1.2)
Total Protein: 7.6 g/dL (ref 6.5–8.1)

## 2023-07-22 LAB — LACTATE DEHYDROGENASE: LDH: 337 U/L — ABNORMAL HIGH (ref 98–192)

## 2023-07-22 MED ORDER — HYDROXYUREA 500 MG PO CAPS
1000.0000 mg | ORAL_CAPSULE | Freq: Every day | ORAL | Status: DC
  Filled 2023-07-22: qty 2

## 2023-07-22 NOTE — Plan of Care (Signed)
Problem: Education: Goal: Knowledge of vaso-occlusive preventative measures will improve Outcome: Adequate for Discharge Goal: Awareness of infection prevention will improve Outcome: Adequate for Discharge Goal: Awareness of signs and symptoms of anemia will improve Outcome: Adequate for Discharge Goal: Long-term complications will improve Outcome: Adequate for Discharge   Problem: Self-Care: Goal: Ability to incorporate actions that prevent/reduce pain crisis will improve Outcome: Adequate for Discharge   Problem: Bowel/Gastric: Goal: Gut motility will be maintained Outcome: Adequate for Discharge   Problem: Tissue Perfusion: Goal: Complications related to inadequate tissue perfusion will be avoided or minimized Outcome: Adequate for Discharge   Problem: Respiratory: Goal: Pulmonary complications will be avoided or minimized Outcome: Adequate for Discharge Goal: Acute Chest Syndrome will be identified early to prevent complications Outcome: Adequate for Discharge   Problem: Fluid Volume: Goal: Ability to maintain a balanced intake and output will improve Outcome: Adequate for Discharge   Problem: Sensory: Goal: Pain level will decrease with appropriate interventions Outcome: Adequate for Discharge   Problem: Health Behavior: Goal: Postive changes in compliance with treatment and prescription regimens will improve Outcome: Adequate for Discharge   Problem: Education: Goal: Knowledge of General Education information will improve Description: Including pain rating scale, medication(s)/side effects and non-pharmacologic comfort measures Outcome: Adequate for Discharge   Problem: Health Behavior/Discharge Planning: Goal: Ability to manage health-related needs will improve Outcome: Adequate for Discharge   Problem: Clinical Measurements: Goal: Ability to maintain clinical measurements within normal limits will improve Outcome: Adequate for Discharge Goal: Will remain  free from infection Outcome: Adequate for Discharge Goal: Diagnostic test results will improve Outcome: Adequate for Discharge Goal: Respiratory complications will improve Outcome: Adequate for Discharge Goal: Cardiovascular complication will be avoided Outcome: Adequate for Discharge   Problem: Activity: Goal: Risk for activity intolerance will decrease Outcome: Adequate for Discharge   Problem: Nutrition: Goal: Adequate nutrition will be maintained Outcome: Adequate for Discharge   Problem: Coping: Goal: Level of anxiety will decrease Outcome: Adequate for Discharge   Problem: Elimination: Goal: Will not experience complications related to bowel motility Outcome: Adequate for Discharge Goal: Will not experience complications related to urinary retention Outcome: Adequate for Discharge   Problem: Pain Management: Goal: General experience of comfort will improve Outcome: Adequate for Discharge   Problem: Safety: Goal: Ability to remain free from injury will improve Outcome: Adequate for Discharge   Problem: Skin Integrity: Goal: Risk for impaired skin integrity will decrease Outcome: Adequate for Discharge   Problem: Education: Goal: Knowledge of vaso-occlusive preventative measures will improve Outcome: Adequate for Discharge Goal: Awareness of infection prevention will improve Outcome: Adequate for Discharge Goal: Awareness of signs and symptoms of anemia will improve Outcome: Adequate for Discharge Goal: Long-term complications will improve Outcome: Adequate for Discharge   Problem: Self-Care: Goal: Ability to incorporate actions that prevent/reduce pain crisis will improve Outcome: Adequate for Discharge   Problem: Bowel/Gastric: Goal: Gut motility will be maintained Outcome: Adequate for Discharge   Problem: Tissue Perfusion: Goal: Complications related to inadequate tissue perfusion will be avoided or minimized Outcome: Adequate for Discharge    Problem: Respiratory: Goal: Pulmonary complications will be avoided or minimized Outcome: Adequate for Discharge Goal: Acute Chest Syndrome will be identified early to prevent complications Outcome: Adequate for Discharge   Problem: Fluid Volume: Goal: Ability to maintain a balanced intake and output will improve Outcome: Adequate for Discharge   Problem: Sensory: Goal: Pain level will decrease with appropriate interventions Outcome: Adequate for Discharge   Problem: Health Behavior: Goal: Postive changes in compliance  with treatment and prescription regimens will improve Outcome: Adequate for Discharge   Problem: Education: Goal: Knowledge of General Education information will improve Description: Including pain rating scale, medication(s)/side effects and non-pharmacologic comfort measures Outcome: Adequate for Discharge   Problem: Health Behavior/Discharge Planning: Goal: Ability to manage health-related needs will improve Outcome: Adequate for Discharge   Problem: Clinical Measurements: Goal: Ability to maintain clinical measurements within normal limits will improve Outcome: Adequate for Discharge Goal: Will remain free from infection Outcome: Adequate for Discharge Goal: Diagnostic test results will improve Outcome: Adequate for Discharge Goal: Respiratory complications will improve Outcome: Adequate for Discharge Goal: Cardiovascular complication will be avoided Outcome: Adequate for Discharge   Problem: Activity: Goal: Risk for activity intolerance will decrease Outcome: Adequate for Discharge   Problem: Nutrition: Goal: Adequate nutrition will be maintained Outcome: Adequate for Discharge   Problem: Coping: Goal: Level of anxiety will decrease Outcome: Adequate for Discharge   Problem: Elimination: Goal: Will not experience complications related to bowel motility Outcome: Adequate for Discharge Goal: Will not experience complications related to urinary  retention Outcome: Adequate for Discharge   Problem: Pain Management: Goal: General experience of comfort will improve Outcome: Adequate for Discharge   Problem: Safety: Goal: Ability to remain free from injury will improve Outcome: Adequate for Discharge   Problem: Skin Integrity: Goal: Risk for impaired skin integrity will decrease Outcome: Adequate for Discharge   Problem: Education: Goal: Knowledge of vaso-occlusive preventative measures will improve Outcome: Adequate for Discharge Goal: Awareness of infection prevention will improve Outcome: Adequate for Discharge Goal: Awareness of signs and symptoms of anemia will improve Outcome: Adequate for Discharge Goal: Long-term complications will improve Outcome: Adequate for Discharge   Problem: Self-Care: Goal: Ability to incorporate actions that prevent/reduce pain crisis will improve Outcome: Adequate for Discharge   Problem: Bowel/Gastric: Goal: Gut motility will be maintained Outcome: Adequate for Discharge   Problem: Tissue Perfusion: Goal: Complications related to inadequate tissue perfusion will be avoided or minimized Outcome: Adequate for Discharge   Problem: Respiratory: Goal: Pulmonary complications will be avoided or minimized Outcome: Adequate for Discharge Goal: Acute Chest Syndrome will be identified early to prevent complications Outcome: Adequate for Discharge   Problem: Fluid Volume: Goal: Ability to maintain a balanced intake and output will improve Outcome: Adequate for Discharge   Problem: Sensory: Goal: Pain level will decrease with appropriate interventions Outcome: Adequate for Discharge   Problem: Health Behavior: Goal: Postive changes in compliance with treatment and prescription regimens will improve Outcome: Adequate for Discharge   Problem: Education: Goal: Knowledge of General Education information will improve Description: Including pain rating scale, medication(s)/side effects  and non-pharmacologic comfort measures Outcome: Adequate for Discharge   Problem: Health Behavior/Discharge Planning: Goal: Ability to manage health-related needs will improve Outcome: Adequate for Discharge   Problem: Clinical Measurements: Goal: Ability to maintain clinical measurements within normal limits will improve Outcome: Adequate for Discharge Goal: Will remain free from infection Outcome: Adequate for Discharge Goal: Diagnostic test results will improve Outcome: Adequate for Discharge Goal: Respiratory complications will improve Outcome: Adequate for Discharge Goal: Cardiovascular complication will be avoided Outcome: Adequate for Discharge   Problem: Activity: Goal: Risk for activity intolerance will decrease Outcome: Adequate for Discharge   Problem: Nutrition: Goal: Adequate nutrition will be maintained Outcome: Adequate for Discharge   Problem: Coping: Goal: Level of anxiety will decrease Outcome: Adequate for Discharge   Problem: Elimination: Goal: Will not experience complications related to bowel motility Outcome: Adequate for Discharge Goal: Will not experience  complications related to urinary retention Outcome: Adequate for Discharge   Problem: Pain Management: Goal: General experience of comfort will improve Outcome: Adequate for Discharge   Problem: Safety: Goal: Ability to remain free from injury will improve Outcome: Adequate for Discharge   Problem: Skin Integrity: Goal: Risk for impaired skin integrity will decrease Outcome: Adequate for Discharge

## 2023-07-22 NOTE — Discharge Summary (Signed)
Physician Discharge Summary  Jonathan [name] GEX:[id number] DOB: February 14, 2001 DOA: 07/18/2023  PCP: Jonathan Angst, MD  Admit date: 07/18/2023  Discharge date: 07/22/2023  Discharge Diagnoses:  Active Problems:   Sickle cell pain crisis (HCC)   History of sickle cell disease   Bilirubinemia   Elevated blood pressure reading   Leukocytosis   Discharge Condition: Stable  Disposition:   Follow-up Information     Jonathan Maroon, FNP Follow up in 1 week(s).   Specialty: Family Medicine Contact information: [address] [place] [address] [phone]                Pt is discharged home in good condition and is to follow up with Jonathan Angst, MD this week to have labs evaluated. Jonathan [name] is instructed to increase activity slowly and balance with rest for the next few days, and use prescribed medication to complete treatment of pain  Diet: Regular Wt Readings from Last 3 Encounters:  07/17/23 69 kg  06/09/23 69.6 kg  03/03/23 66.8 kg   HPI:  Jonathan [name] is a 22 year old male with a medical history significant for sickle cell disease that presented to the emergency department for complaints of bilateral lower extremity pain and back pain that started around 10 AM on 06/16/2023.  The patient presented to the emergency department earlier in the evening with these complaints.  His pain responded well to ibuprofen and oxycodone in the ED and patient was discharged.  He came back later in the evening for worsening bilateral lower extremity pain that has remained persistent and now developing in his back as well.  Patient denies any chest pain, heart palpitations, shortness of breath, headache, blurry vision, or abdominal pain.  Denies any fever, chills, cough, or sputum production. Patient reported that he most has bilateral upper thigh pain and back pain at an intensity of 8/10.   ED course: At presentation to ED, patient found to have  elevated blood pressure at 160/87.  Oxygen saturation 94% on room air.  Heart rate 97 and respirations 15. CBC shows leukocytosis of 13.8, hemoglobin 10.2, and platelet count 173,000.  CMP unremarkable, except bilirubin 3.7.  Normal urinalysis.  In the emergency department patient was given Toradol and Dilaudid, pain persisted and he was admitted for sickle cell pain crisis.     Hospital Course:  Sickle cell disease with pain crisis: Patient was admitted for sickle cell pain crisis and managed appropriately with IVF, IV Dilaudid via PCA and IV Toradol, as well as other adjunct therapies per sickle cell pain management protocols.  IV Dilaudid PCA was weaned appropriately and patient requested discharge home on 06/19/2023.  However, patient's hemoglobin decreased to 5.3 g/dL.  Discharge was rescinded and patient was transfused 2 units PRBCs. Today, patient's pain intensity has improved to 3/10 primarily to his lower back. Patient's hemoglobin has improved to baseline at 9.3.  He will follow-up with his PCP in 1 week for medication management and to repeat CBC with differential and CMP. Vital signs all within normal limits.  Patient is alert, oriented, and ambulating without assistance.  He is aware of all upcoming appointments.  He does not have any medication refill needs at this time.  Patient was therefore discharged home today in a hemodynamically stable condition.   [name] will follow-up with PCP within 1 week of this discharge. Jonathan [name] was counseled extensively about nonpharmacologic means of pain management, patient verbalized understanding and was appreciative of  the care received during this admission.   We discussed the need for good hydration, monitoring of hydration status, avoidance of heat, cold, stress, and infection triggers. We discussed the need to be adherent with taking Hydrea and other home medications. Patient was reminded of the need to seek medical attention immediately if any  symptom of bleeding, anemia, or infection occurs.  Discharge Exam: Vitals:   07/22/23 0852 07/22/23 1002  BP:  134/87  Pulse:  (!) 101  Resp: 19 16  Temp:  98.4 F (36.9 C)  SpO2: 99% 94%   Vitals:   07/22/23 0421 07/22/23 0548 07/22/23 0852 07/22/23 1002  BP:  126/77  134/87  Pulse:  94  (!) 101  Resp: 18 18 19 16   Temp:  99.1 F (37.3 C)  98.4 F (36.9 C)  TempSrc:  Oral  Oral  SpO2: 95% 97% 99% 94%    General appearance : Awake, alert, not in any distress. Speech Clear. Not toxic looking HEENT: Atraumatic and Normocephalic, pupils equally reactive to light and accomodation Neck: Supple, no JVD. No cervical lymphadenopathy.  Chest: Good air entry bilaterally, no added sounds  CVS: S1 S2 regular, no murmurs.  Abdomen: Bowel sounds present, Non tender and not distended with no gaurding, rigidity or rebound. Extremities: B/L Lower Ext shows no edema, both legs are warm to touch Neurology: Awake alert, and oriented X 3, CN II-XII intact, Non focal Skin: No Rash  Discharge Instructions  Discharge Instructions     Discharge patient   Complete by: As directed    Discharge disposition: 01-Home or Self Care   Discharge patient date: 07/22/2023      Allergies as of 07/22/2023       Reactions   Allergy Medication [diphenhydramine] Swelling, Other (See Comments)   Face swells        Medication List     TAKE these medications    cyclobenzaprine 5 MG tablet Commonly known as: FLEXERIL Take 1 tablet (5 mg total) by mouth 3 (three) times daily as needed for muscle spasms.   folic acid 1 MG tablet Commonly known as: FOLVITE Take 1 tablet (1 mg total) by mouth daily.   hydroxyurea 500 MG capsule Commonly known as: HYDREA Take 1 capsule (500 mg total) by mouth daily. May take with food to minimize GI side effects. What changed:  how much to take additional instructions   ibuprofen 800 MG tablet Commonly known as: ADVIL Take 1 tablet (800 mg total) by mouth  every 8 (eight) hours as needed. What changed: reasons to take this   Oxycodone HCl 10 MG Tabs Take 1 tablet (10 mg total) by mouth every 6 (six) hours as needed. What changed: reasons to take this        The results of significant diagnostics from this hospitalization (including imaging, microbiology, ancillary and laboratory) are listed below for reference.    Significant Diagnostic Studies: DG CHEST PORT 1 VIEW  Result Date: 07/20/2023 CLINICAL DATA:  Sickle cell pain, crisis EXAM: PORTABLE CHEST 1 VIEW COMPARISON:  02/09/2023 FINDINGS: Low lung volumes with bibasilar opacities. No effusions or pneumothorax. Heart is borderline in size. Mediastinal contours are within normal limits. No acute bony abnormality. IMPRESSION: Low lung volumes with bibasilar atelectasis or infiltrates. Electronically Signed   By: Charlett Nose M.D.   On: 07/20/2023 16:58    Microbiology: Recent Results (from the past 240 hour(s))  Culture, blood (Routine X 2) w Reflex to ID Panel     Status:  None (Preliminary result)   Collection Time: 07/20/23  4:21 PM   Specimen: BLOOD RIGHT ARM  Result Value Ref Range Status   Specimen Description   Final    BLOOD RIGHT ARM Performed at [facility], [address], [place], [place] [address]    Special Requests   Final    BOTTLES DRAWN AEROBIC ONLY Blood Culture adequate volume Performed at [facility], [address], [place], [place] [address]    Culture   Final    NO GROWTH 2 DAYS Performed at [facility], [address], [place], [place] [address]    Report Status PENDING  Incomplete  Culture, blood (Routine X 2) w Reflex to ID Panel     Status: None (Preliminary result)   Collection Time: 07/20/23  4:31 PM   Specimen: BLOOD LEFT ARM  Result Value Ref Range Status   Specimen Description   Final    BLOOD LEFT ARM Performed at [facility], [address], [place], [place] [address]    Special Requests   Final     BOTTLES DRAWN AEROBIC ONLY Blood Culture adequate volume Performed at [facility], [address] [place], [place] [address]    Culture   Final    NO GROWTH 2 DAYS Performed at [facility], [address] [place], [place] [address]    Report Status PENDING  Incomplete     Labs: Basic Metabolic Panel: Recent Labs  Lab 07/17/23 1642 07/18/23 0528 07/19/23 0554 07/22/23 0606  NA 140 137 133* 135  K 4.2 3.8 3.7 4.0  CL 108 106 101 100  CO2 23 23 24 23   GLUCOSE 137* 107* 77 117*  BUN 12 11 11 12   CREATININE 0.72 0.76 0.72 0.72  CALCIUM 8.9 8.7* 8.7* 9.0   Liver Function Tests: Recent Labs  Lab 07/17/23 1642 07/18/23 0528 07/19/23 0554 07/22/23 0606  AST 31 45* 46* 13*  ALT 11 16 21 12   ALKPHOS 123 106 163* 119  BILITOT 3.7* 2.6* 3.0* 1.5*  PROT 8.2* 7.3 7.1 7.6  ALBUMIN 4.6 4.2 3.9 3.4*   No results for input(s): "LIPASE", "AMYLASE" in the last 168 hours. No results for input(s): "AMMONIA" in the last 168 hours. CBC: Recent Labs  Lab 07/17/23 1642 07/18/23 0528 07/19/23 0554 07/20/23 1631 07/21/23 0544 07/22/23 0606  WBC 13.8* 13.4* 11.3* 14.9*  --  9.4  NEUTROABS 9.6*  --   --  11.5*  --   --   HGB 10.2* 8.7* 8.3* 5.0* 9.3* 9.3*  HCT 29.3* 25.2* 24.1* 14.5* 26.9* 27.9*  MCV 97.7 96.9 97.2 95.4  --  90.9  PLT 173 188 173 231  --  278   Cardiac Enzymes: No results for input(s): "CKTOTAL", "CKMB", "CKMBINDEX", "TROPONINI" in the last 168 hours. BNP: Invalid input(s): "POCBNP" CBG: No results for input(s): "GLUCAP" in the last 168 hours.  Time coordinating discharge: 30 minutes  Signed:  [name]  APRN, MSN, FNP-C Patient Care [facility] [address] [place], [place] [address] [phone]  [facility] 07/22/2023, 12:49 PM

## 2023-07-24 ENCOUNTER — Telehealth: Payer: Self-pay

## 2023-07-24 LAB — BPAM RBC
Blood Product Expiration Date: 202412152359
Blood Product Expiration Date: 202412302359
ISSUE DATE / TIME: 202411252031
ISSUE DATE / TIME: 202411260120
Unit Type and Rh: 5100
Unit Type and Rh: 5100

## 2023-07-24 LAB — TYPE AND SCREEN
ABO/RH(D): O POS
Antibody Screen: NEGATIVE
Unit division: 0
Unit division: 0

## 2023-07-24 NOTE — Transitions of Care (Post Inpatient/ED Visit) (Signed)
07/24/2023  Name: Jonathan [name] MRN: [id number] DOB: 2001-04-30  Today's TOC FU Call Status: Today's TOC FU Call Status:: Successful TOC FU Call Completed TOC FU Call Complete Date: 07/24/23 Patient's Name and Date of Birth confirmed.  Transition Care Management Follow-up Telephone Call Date of Discharge: 07/22/23 Discharge Facility: [facility]) Type of Discharge: Inpatient Admission Primary Inpatient Discharge Diagnosis:: Sickle Cell Crisis How have you been since you were released from the hospital?: Better (" I actually do not have any pain now") Any questions or concerns?: No  Items Reviewed: Did you receive and understand the discharge instructions provided?: Yes Medications obtained,verified, and reconciled?: Yes (Medications Reviewed) Any new allergies since your discharge?: No Dietary orders reviewed?: NA Do you have support at home?: Yes  Medications Reviewed Today: Medications Reviewed Today     Reviewed by Wyline Mood, RN (Case Manager) on 07/24/23 at 1425  Med List Status: <None>   Medication Order Taking? Sig Documenting Provider Last Dose Status Informant  cyclobenzaprine (FLEXERIL) 5 MG tablet [id number] Yes Take 1 tablet (5 mg total) by mouth 3 (three) times daily as needed for muscle spasms. Massie Maroon, FNP Taking Active   folic acid (FOLVITE) 1 MG tablet [id number] Yes Take 1 tablet (1 mg total) by mouth daily. Massie Maroon, FNP Taking Active Self  hydroxyurea (HYDREA) 500 MG capsule [id number] Yes Take 1 capsule (500 mg total) by mouth daily. May take with food to minimize GI side effects.  Patient taking differently: Take 1,000 mg by mouth daily. Take 1,000 mg by mouth once a day- with food to minimize GI side effects   Massie Maroon, FNP Taking Active Self           Med Note Antony Madura, Arn Medal   Fri Jul 17, 2023  8:27 PM) The patient stated TWICE he takes this as "1,000 mg. a day." I told him the pharmacy fills this as "30 capsules to  last 30 days."  ibuprofen (ADVIL) 800 MG tablet [id number] Yes Take 1 tablet (800 mg total) by mouth every 8 (eight) hours as needed.  Patient taking differently: Take 800 mg by mouth every 8 (eight) hours as needed for mild pain (pain score 1-3) or headache.   Quentin Angst, MD Taking Active Self  Oxycodone HCl 10 MG TABS [id number] Yes Take 1 tablet (10 mg total) by mouth every 6 (six) hours as needed.  Patient taking differently: Take 10 mg by mouth every 6 (six) hours as needed (for pain).   Massie Maroon, FNP Taking Active Self            Home Care and Equipment/Supplies: Were Home Health Services Ordered?: No Any new equipment or medical supplies ordered?: No  Functional Questionnaire: Do you need assistance with bathing/showering or dressing?: No Do you need assistance with meal preparation?: No Do you need assistance with eating?: No Do you have difficulty maintaining continence: No Do you need assistance with getting out of bed/getting out of a chair/moving?: No Do you have difficulty managing or taking your medications?: No  Follow up appointments reviewed: PCP Follow-up appointment confirmed?: Yes Date of PCP follow-up appointment?: 07/29/23 (Patient states his appointment is scheduled for Wednesday, 07/29/2023 with primary care.) Follow-up Provider: Cresenciano Genre Follow-up appointment confirmed?: NA Do you need transportation to your follow-up appointment?: No Do you understand care options if your condition(s) worsen?: Yes-patient verbalized understanding  SDOH Interventions Today    Flowsheet Row Most Recent Value  SDOH Interventions   Food Insecurity Interventions Intervention Not Indicated  Housing Interventions Intervention Not Indicated  Utilities Interventions Intervention Not Indicated     TOC Interventions:   Jonathan [name] Daphine Deutscher BSN, RN RN Care Manager   Transitions of Care [facility] - [facility]  Dial Number:  [phone]

## 2023-07-25 LAB — CULTURE, BLOOD (ROUTINE X 2)
Culture: NO GROWTH
Culture: NO GROWTH
Special Requests: ADEQUATE
Special Requests: ADEQUATE

## 2023-07-28 ENCOUNTER — Ambulatory Visit: Payer: 59 | Admitting: Family Medicine

## 2023-07-28 ENCOUNTER — Encounter: Payer: Self-pay | Admitting: Family Medicine

## 2023-07-28 VITALS — BP 116/71 | HR 83 | Temp 97.3°F | Wt 145.0 lb

## 2023-07-28 DIAGNOSIS — Z79891 Long term (current) use of opiate analgesic: Secondary | ICD-10-CM

## 2023-07-28 DIAGNOSIS — Z09 Encounter for follow-up examination after completed treatment for conditions other than malignant neoplasm: Secondary | ICD-10-CM | POA: Diagnosis not present

## 2023-07-28 DIAGNOSIS — E559 Vitamin D deficiency, unspecified: Secondary | ICD-10-CM

## 2023-07-28 DIAGNOSIS — D571 Sickle-cell disease without crisis: Secondary | ICD-10-CM | POA: Diagnosis not present

## 2023-07-28 MED ORDER — OXYCODONE HCL 10 MG PO TABS: 10.0000 mg | ORAL_TABLET | Freq: Four times a day (QID) | ORAL | 0 refills | Status: DC | PRN

## 2023-07-28 NOTE — Progress Notes (Signed)
Established Patient Office Visit  Subjective   Patient ID: Jonathan [name], male    DOB: [birth year]  Age: 22 y.o. MRN: [id number]  Chief Complaint  Patient presents with   Hospitalization Follow-up    Jonathan [name] is a 22 year old male with a medical history significant for sickle cell disease, chronic pain syndrome, opiate dependence and tolerance, and anemia of chronic disease presents for posthospital follow-up.  Patient has been feeling well and is without complaint.  Patient was admitted from 07/18/2023 - 07/22/2023 for sickle cell pain crisis. During that time, patient warranted 2 units PRBCs for vaso-occlusive crisis.  Patient's hemoglobin returned to baseline prior to discharge.  He states that pain intensity has greatly improved since discharge home.  He has been taking oxycodone 10 mg around every 6 hours for breakthrough pain.  Patient is also incorporated ibuprofen into his medication regimen.  Patient resumed his hydroxyurea and has been taking this medication consistently. He denies any pain today.  No headache, chest pain, shortness of breath, urinary symptoms, nausea, vomiting, or diarrhea.    Patient Active Problem List   Diagnosis Date Noted   History of sickle cell disease 07/18/2023   Bilirubinemia 07/18/2023   Elevated blood pressure reading 07/18/2023   Leukocytosis 07/18/2023   Sickle cell anemia with pain (HCC) 04/13/2023   Elevated AST (SGOT) 08/19/2022   Elevated random blood glucose level 08/19/2022   Chronic pain 06/11/2022   Community acquired pneumonia 02/12/2022   Pulmonary nodule 02/12/2022   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HCC) 09/02/2021   Sickle cell crisis (HCC) 07/27/2021   COVID-19 virus infection 07/27/2021   Anemia of chronic disease 06/27/2021   Thrombocytosis 06/27/2021   Mild intermittent asthma without complication 11/11/2020   Sickle cell disease with crisis (HCC) 10/23/2020   Sickle cell pain crisis (HCC) 08/16/2020   Proteinuria  04/14/2020   Influenza 10/13/2017   Sickle cell disease (HCC) 04/27/2017   Failed hearing screening 03/25/2016   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   Myopic astigmatism 03/16/2015   Avascular necrosis of bone of left hip (HCC) 03/24/2013   Chronic obstruct airways disease (HCC) 03/15/2013   Snoring 04/08/2012   Hypertension 11/11/2011   Reactive airway disease 11/09/2011   Sickle cell crisis acute chest syndrome (HCC) 07/30/2011   Past Medical History:  Diagnosis Date   Acute chest syndrome(517.3) 08/26/2011   march 2013, Sept 2013   Asthma    Avascular necrosis of bone of left hip (HCC)    Sickle cell disease, type SS (HCC)    Past Surgical History:  Procedure Laterality Date   ADENOIDECTOMY     SPLENECTOMY     TONSILLECTOMY     TONSILLECTOMY AND ADENOIDECTOMY     TOTAL HIP ARTHROPLASTY Left 03/07/2016   UMBILICAL HERNIA REPAIR     Social History   Tobacco Use   Smoking status: Never   Smokeless tobacco: Never  Vaping Use   Vaping status: Never Used  Substance Use Topics   Alcohol use: No    Alcohol/week: 0.0 standard drinks of alcohol   Drug use: No   Social History   Socioeconomic History   Marital status: Single    Spouse name: Not on file   Number of children: Not on file   Years of education: Not on file   Highest education level: Not on file  Occupational History   Not on file  Tobacco Use   Smoking status: Never   Smokeless tobacco: Never  Vaping Use   Vaping status: Never Used  Substance and Sexual Activity   Alcohol use: No    Alcohol/week: 0.0 standard drinks of alcohol   Drug use: No   Sexual activity: Never  Other Topics Concern   Not on file  Social History Narrative   Lives with mother.  No smokers at home      Update:       05/02/16 Lives with Mother only   Social Drivers of Health   Financial Resource Strain: Not on file  Food Insecurity: No Food Insecurity (07/24/2023)   Hunger Vital Sign    Worried About Running Out  of Food in the Last Year: Never true    Ran Out of Food in the Last Year: Never true  Transportation Needs: Unmet Transportation Needs (07/18/2023)   PRAPARE - Administrator, Civil Service (Medical): Yes    Lack of Transportation (Non-Medical): Yes  Physical Activity: Not on file  Stress: Not on file  Social Connections: Not on file  Intimate Partner Violence: Not At Risk (07/24/2023)   Humiliation, Afraid, Rape, and Kick questionnaire    Fear of Current or Ex-Partner: No    Emotionally Abused: No    Physically Abused: No    Sexually Abused: No   Family Status  Relation Name Status   Mother  (Not Specified)   MGM  (Not Specified)   MGF  (Not Specified)   PGF  (Not Specified)  No partnership data on file   Family History  Problem Relation Age of Onset   Hypertension Mother    Hypertension Maternal Grandmother    Hypertension Maternal Grandfather    Asthma Paternal Grandfather    Diabetes Paternal Grandfather    Allergies  Allergen Reactions   Allergy Medication [Diphenhydramine] Swelling and Other (See Comments)    Face swells      Review of Systems  Constitutional:  Negative for chills and fever.  HENT: Negative.    Eyes: Negative.   Respiratory: Negative.    Cardiovascular: Negative.   Gastrointestinal: Negative.   Genitourinary: Negative.   Musculoskeletal: Negative.   Skin: Negative.   Neurological: Negative.   Endo/Heme/Allergies: Negative.   Psychiatric/Behavioral:  The patient is not nervous/anxious.       Objective:     BP 116/71   Pulse 83   Temp (!) 97.3 F (36.3 C)   Wt 145 lb (65.8 kg)   SpO2 100%   BMI 24.13 kg/m  BP Readings from Last 3 Encounters:  07/28/23 116/71  07/22/23 134/87  07/17/23 136/85   Wt Readings from Last 3 Encounters:  07/28/23 145 lb (65.8 kg)  07/17/23 152 lb 1.9 oz (69 kg)  06/09/23 153 lb 6.4 oz (69.6 kg)      Physical Exam Constitutional:      Appearance: Normal appearance.  Eyes:      Pupils: Pupils are equal, round, and reactive to light.  Cardiovascular:     Rate and Rhythm: Normal rate and regular rhythm.  Abdominal:     General: Bowel sounds are normal.  Musculoskeletal:        General: Normal range of motion.  Skin:    General: Skin is warm.  Neurological:     General: No focal deficit present.     Mental Status: He is alert. Mental status is at baseline.  Psychiatric:        Mood and Affect: Mood normal.        Behavior: Behavior normal.  Thought Content: Thought content normal.        Judgment: Judgment normal.      No results found for any visits on 07/28/23.  Last CBC Lab Results  Component Value Date   WBC 6.9 07/28/2023   HGB 10.1 (L) 07/28/2023   HCT 33.5 (L) 07/28/2023   MCV 97 07/28/2023   MCH 29.4 07/28/2023   RDW 19.3 (H) 07/28/2023   PLT 1,235 (HH) 07/28/2023   Last metabolic panel Lab Results  Component Value Date   GLUCOSE 117 (H) 07/22/2023   NA 135 07/22/2023   K 4.0 07/22/2023   CL 100 07/22/2023   CO2 23 07/22/2023   BUN 12 07/22/2023   CREATININE 0.72 07/22/2023   GFRNONAA >60 07/22/2023   CALCIUM 9.0 07/22/2023   PHOS 4.6 08/12/2022   PROT 7.6 07/22/2023   ALBUMIN 3.4 (L) 07/22/2023   LABGLOB 2.8 06/09/2023   AGRATIO 1.3 09/02/2022   BILITOT 1.5 (H) 07/22/2023   ALKPHOS 119 07/22/2023   AST 13 (L) 07/22/2023   ALT 12 07/22/2023   ANIONGAP 12 07/22/2023   Last lipids Lab Results  Component Value Date   TRIG 44 10/23/2020   Last hemoglobin A1c No results found for: "HGBA1C" Last thyroid functions No results found for: "TSH", "T3TOTAL", "T4TOTAL", "THYROIDAB" Last vitamin D Lab Results  Component Value Date   VD25OH CANCELED 06/09/2023   Last vitamin B12 and Folate Lab Results  Component Value Date   VITAMINB12 221 07/18/2023   FOLATE 6.0 07/18/2023      The ASCVD Risk score (Arnett DK, et al., 2019) failed to calculate for the following reasons:   The 2019 ASCVD risk score is only valid  for ages 56 to 53    Assessment & Plan:   Problem List Items Addressed This Visit   None Visit Diagnoses       Hb-SS disease without crisis (HCC)    -  Primary   Relevant Medications   Oxycodone HCl 10 MG TABS   Other Relevant Orders   CBC with Differential (Completed)     Hospital discharge follow-up         Vitamin D deficiency         Chronic prescription opiate use       Relevant Medications   Oxycodone HCl 10 MG TABS      1. Hospital discharge follow-up Patient does not warrant any new prescriptions for maintenance medications.  He states that he has everything at home. Patient will continue oxycodone 10 mg every 6 hours as needed for severe breakthrough pain.  Also, continue hydrating consistently.  2. Hb-SS disease without crisis (HCC) (Primary) Continue folic acid 1 mg daily and resume hydroxyurea. - CBC with Differential - Oxycodone HCl 10 MG TABS; Take 1 tablet (10 mg total) by mouth every 6 (six) hours as needed.  Dispense: 60 tablet; Refill: 0  3. Vitamin D deficiency Will reassess vitamin D level at 37-month follow-up  4. Chronic prescription opiate use  - Oxycodone HCl 10 MG TABS; Take 1 tablet (10 mg total) by mouth every 6 (six) hours as needed.  Dispense: 60 tablet; Refill: 0  Return in about 3 months (around 10/26/2023) for sickle cell anemia.   [name]  APRN, MSN, FNP-C Patient Care [facility] [address] [place], [place] [address] [phone]

## 2023-07-29 ENCOUNTER — Telehealth: Payer: Self-pay | Admitting: Internal Medicine

## 2023-07-29 LAB — CBC WITH DIFFERENTIAL/PLATELET
Basophils Absolute: 0.1 10*3/uL (ref 0.0–0.2)
Basos: 1 %
EOS (ABSOLUTE): 0.2 10*3/uL (ref 0.0–0.4)
Eos: 2 %
Hematocrit: 33.5 % — ABNORMAL LOW (ref 37.5–51.0)
Hemoglobin: 10.1 g/dL — ABNORMAL LOW (ref 13.0–17.7)
Immature Grans (Abs): 0 10*3/uL (ref 0.0–0.1)
Immature Granulocytes: 0 %
Lymphocytes Absolute: 2.4 10*3/uL (ref 0.7–3.1)
Lymphs: 35 %
MCH: 29.4 pg (ref 26.6–33.0)
MCHC: 30.1 g/dL — ABNORMAL LOW (ref 31.5–35.7)
MCV: 97 fL (ref 79–97)
Monocytes Absolute: 0.7 10*3/uL (ref 0.1–0.9)
Monocytes: 9 %
NRBC: 2 % — ABNORMAL HIGH (ref 0–0)
Neutrophils Absolute: 3.6 10*3/uL (ref 1.4–7.0)
Neutrophils: 53 %
Platelets: 1235 10*3/uL (ref 150–450)
RBC: 3.44 x10E6/uL — ABNORMAL LOW (ref 4.14–5.80)
RDW: 19.3 % — ABNORMAL HIGH (ref 11.6–15.4)
WBC: 6.9 10*3/uL (ref 3.4–10.8)

## 2023-07-29 NOTE — Telephone Encounter (Signed)
[facility] CALLED WITH CRITICAL LAB RESULTS :  Collected 12/3  Critical Platelets - 1235

## 2023-09-02 ENCOUNTER — Other Ambulatory Visit: Payer: Self-pay | Admitting: Nurse Practitioner

## 2023-09-02 DIAGNOSIS — D5700 Hb-SS disease with crisis, unspecified: Secondary | ICD-10-CM

## 2023-09-02 DIAGNOSIS — Z79891 Long term (current) use of opiate analgesic: Secondary | ICD-10-CM

## 2023-09-02 DIAGNOSIS — D571 Sickle-cell disease without crisis: Secondary | ICD-10-CM

## 2023-09-02 MED ORDER — HYDROXYUREA 500 MG PO CAPS: 500.0000 mg | ORAL_CAPSULE | Freq: Every day | ORAL | 3 refills | Status: DC

## 2023-09-02 MED ORDER — IBUPROFEN 800 MG PO TABS: 800.0000 mg | ORAL_TABLET | Freq: Three times a day (TID) | ORAL | 4 refills | Status: DC | PRN

## 2023-09-02 MED ORDER — OXYCODONE HCL 10 MG PO TABS: 10.0000 mg | ORAL_TABLET | Freq: Four times a day (QID) | ORAL | 0 refills | Status: DC | PRN

## 2023-09-02 NOTE — Progress Notes (Signed)
 1. Hb-SS disease without crisis (HCC)  - Oxycodone  HCl 10 MG TABS; Take 1 tablet (10 mg total) by mouth every 6 (six) hours as needed for up to 15 days.  Dispense: 60 tablet; Refill: 0  2. Chronic prescription opiate use  - Oxycodone  HCl 10 MG TABS; Take 1 tablet (10 mg total) by mouth every 6 (six) hours as needed for up to 15 days.  Dispense: 60 tablet; Refill: 0  3. 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HCC)  - ibuprofen  (ADVIL ) 800 MG tablet; Take 1 tablet (800 mg total) by mouth every 8 (eight) hours as needed.  Dispense: 30 tablet; Refill: 4 - hydroxyurea  (HYDREA ) 500 MG capsule; Take 1 capsule (500 mg total) by mouth daily. May take with food to minimize GI side effects.  Dispense: 90 capsule; Refill: 3   Reviewed PDMP substance reporting system prior to prescribing opiate medications. No inconsistencies noted.

## 2023-09-02 NOTE — Telephone Encounter (Signed)
 Please advise [facility]

## 2023-09-02 NOTE — Telephone Encounter (Signed)
 Copied from CRM [id number]. Topic: Clinical - Medication Refill >> Sep 02, 2023  1:21 PM Alfonso ORN wrote: Most Recent Primary Care Visit:  Provider: TILFORD BERTRAM HERO  Department: [facility]  Visit Type: ACUTE  Date: 07/28/2023  Medication: hydroxyurea  (HYDREA ) 500 MG capsule,ibuprofen  (ADVIL ) 800 MG tablet,Oxycodone  HCl 10 MG TABS  Has the patient contacted their pharmacy? No , pharmacy normally have patient to call provider for the Oxyocodone (Agent: If no, request that the patient contact the pharmacy for the refill. If patient does not wish to contact the pharmacy document the reason why and proceed with request.) (Agent: If yes, when and what did the pharmacy advise?)  Is this the correct pharmacy for this prescription? Yes  If no, delete pharmacy and type the correct one.  This is the patient's preferred pharmacy:  [facility] [phone] - [place], [place] - [address] AT [facility] [address]  [place] [address] Phone: [phone] Fax: [phone]   Has the prescription been filled recently? Yes , last filled in December   Is the patient out of the medication? Yes   Has the patient been seen for an appointment in the last year OR does the patient have an upcoming appointment? Yes   Can we respond through MyChart? Yes   Agent: Please be advised that Rx refills may take up to 3 business days. We ask that you follow-up with your pharmacy.

## 2023-09-08 ENCOUNTER — Ambulatory Visit: Payer: Self-pay | Admitting: Family Medicine

## 2023-09-29 ENCOUNTER — Other Ambulatory Visit: Payer: Self-pay | Admitting: Nurse Practitioner

## 2023-09-29 DIAGNOSIS — D5700 Hb-SS disease with crisis, unspecified: Secondary | ICD-10-CM

## 2023-09-29 MED ORDER — HYDROXYUREA 500 MG PO CAPS: 500.0000 mg | ORAL_CAPSULE | Freq: Every day | ORAL | 3 refills | Status: DC

## 2023-09-29 NOTE — Telephone Encounter (Signed)
 Copied from CRM [id number]. Topic: Clinical - Medication Refill >> Sep 29, 2023 11:30 AM Zebedee SAUNDERS wrote: Most Recent Primary Care Visit:  Provider: TILFORD BERTRAM HERO  Department: [facility]  Visit Type: ACUTE  Date: 07/28/2023  Medication: hydroxyurea  (HYDREA ) 500 MG capsule  Has the patient contacted their pharmacy? Yes (Agent: If no, request that the patient contact the pharmacy for the refill. If patient does not wish to contact the pharmacy document the reason why and proceed with request.) (Agent: If yes, when and what did the pharmacy advise?)  Is this the correct pharmacy for this prescription? Yes If no, delete pharmacy and type the correct one.  This is the patient's preferred pharmacy:  [facility] [phone] - [place], [place] - [address] AT [facility] [address] [place] [address] Phone: [phone] Fax: [phone]   Has the prescription been filled recently? Yes  Is the patient out of the medication? Yes  Has the patient been seen for an appointment in the last year OR does the patient have an upcoming appointment? Yes  Can we respond through MyChart? Yes  Agent: Please be advised that Rx refills may take up to 3 business days. We ask that you follow-up with your pharmacy.

## 2023-09-29 NOTE — Telephone Encounter (Signed)
Last Fill: 09/02/23  Last OV: 07/28/23 Next OV: 11/17/23  Routing to provider for review/authorization.

## 2023-10-21 ENCOUNTER — Other Ambulatory Visit: Payer: Self-pay

## 2023-10-21 DIAGNOSIS — D5700 Hb-SS disease with crisis, unspecified: Secondary | ICD-10-CM

## 2023-10-21 NOTE — Telephone Encounter (Signed)
 Oxy 10 mg IR. Has dropped off please advise pt will see Fola soon. Have a great day. KH

## 2023-11-17 ENCOUNTER — Encounter: Payer: Self-pay | Admitting: Nurse Practitioner

## 2023-11-17 ENCOUNTER — Ambulatory Visit (INDEPENDENT_AMBULATORY_CARE_PROVIDER_SITE_OTHER): Payer: Self-pay | Admitting: Nurse Practitioner

## 2023-11-17 VITALS — BP 116/58 | HR 68 | Temp 97.8°F | Wt 150.0 lb

## 2023-11-17 DIAGNOSIS — E559 Vitamin D deficiency, unspecified: Secondary | ICD-10-CM | POA: Insufficient documentation

## 2023-11-17 DIAGNOSIS — D571 Sickle-cell disease without crisis: Secondary | ICD-10-CM | POA: Insufficient documentation

## 2023-11-17 DIAGNOSIS — Z79891 Long term (current) use of opiate analgesic: Secondary | ICD-10-CM | POA: Insufficient documentation

## 2023-11-17 HISTORY — DX: Vitamin D deficiency, unspecified: E55.9

## 2023-11-17 MED ORDER — OXYCODONE HCL 10 MG PO TABS: 10.0000 mg | ORAL_TABLET | Freq: Four times a day (QID) | ORAL | 0 refills | Status: DC | PRN

## 2023-11-17 MED ORDER — HYDROXYUREA 500 MG PO CAPS: 1500.0000 mg | ORAL_CAPSULE | Freq: Every day | ORAL | 3 refills | Status: DC

## 2023-11-17 MED ORDER — NALOXONE HCL 4 MG/0.1ML NA LIQD: NASAL | 1 refills | Status: AC

## 2023-11-17 NOTE — Progress Notes (Signed)
 New Patient Office Visit  Subjective:  Patient ID: Jonathan [name], male    DOB: 2000/09/03  Age: 23 y.o. MRN: [id number]  CC:  Chief Complaint  Patient presents with   Medical Management of Chronic Issues    HPI Jonathan [name] is a 23 y.o. male  has a past medical history of Acute chest syndrome(517.3) (08/26/2011), Asthma, Avascular necrosis of bone of left hip (HCC), and Sickle cell disease, type SS (HCC).  Patient presented establish care for his chronic medical conditions.  Previous PCP Julianne Handler NP  Sickle cell disease.  Currently on hydroxyurea 1500 mg daily, folic acid 1 mg daily,.  For chronic pain he mainly takes ibuprofen 800 mg 3 times daily as needed for mild pain, oxycodone 10 mg every 6 hours as needed moderate pain.  Stated that he has been out of oxycodone.  He currently denies pain.  Has upcoming appointment for yearly eye exam.  He is currently not established with hematologist  Patient denies fever, chills, chest pain, shortness of breath, abdominal pain, nausea, vomiting    BP Readings from Last 3 Encounters:  11/17/23 (!) 116/58  07/28/23 116/71  07/22/23 134/87    Past Medical History:  Diagnosis Date   Acute chest syndrome(517.3) 08/26/2011   march 2013, Sept 2013   Asthma    Avascular necrosis of bone of left hip (HCC)    Sickle cell disease, type SS (HCC)     Past Surgical History:  Procedure Laterality Date   ADENOIDECTOMY     SPLENECTOMY     TONSILLECTOMY     TONSILLECTOMY AND ADENOIDECTOMY     TOTAL HIP ARTHROPLASTY Left 03/07/2016   UMBILICAL HERNIA REPAIR      Family History  Problem Relation Age of Onset   Hypertension Mother    Hypertension Maternal Grandmother    Hypertension Maternal Grandfather    Asthma Paternal Grandfather    Diabetes Paternal Grandfather     Social History   Socioeconomic History   Marital status: Single    Spouse name: Not on file   Number of children: Not on file   Years of education: Not on  file   Highest education level: Not on file  Occupational History   Not on file  Tobacco Use   Smoking status: Never   Smokeless tobacco: Never  Vaping Use   Vaping status: Never Used  Substance and Sexual Activity   Alcohol use: No    Alcohol/week: 0.0 standard drinks of alcohol   Drug use: No   Sexual activity: Never  Other Topics Concern   Not on file  Social History Narrative   Lives with a room mate    Social Drivers of Health   Financial Resource Strain: Not on file  Food Insecurity: No Food Insecurity (07/24/2023)   Hunger Vital Sign    Worried About Running Out of Food in the Last Year: Never true    Ran Out of Food in the Last Year: Never true  Transportation Needs: Unmet Transportation Needs (07/18/2023)   PRAPARE - Administrator, Civil Service (Medical): Yes    Lack of Transportation (Non-Medical): Yes  Physical Activity: Not on file  Stress: Not on file  Social Connections: Not on file  Intimate Partner Violence: Not At Risk (07/24/2023)   Humiliation, Afraid, Rape, and Kick questionnaire    Fear of Current or Ex-Partner: No    Emotionally Abused: No    Physically Abused: No  Sexually Abused: No    ROS Review of Systems  Constitutional:  Negative for appetite change, chills, fatigue and fever.  HENT:  Negative for congestion, postnasal drip, rhinorrhea and sneezing.   Respiratory:  Negative for cough, shortness of breath and wheezing.   Cardiovascular:  Negative for chest pain, palpitations and leg swelling.  Gastrointestinal:  Negative for abdominal pain, constipation, nausea and vomiting.  Genitourinary:  Negative for difficulty urinating, dysuria, flank pain and frequency.  Musculoskeletal:  Negative for arthralgias, back pain, joint swelling and myalgias.  Skin:  Negative for color change, pallor, rash and wound.  Neurological:  Negative for dizziness, facial asymmetry, weakness, numbness and headaches.  Psychiatric/Behavioral:   Negative for behavioral problems, confusion, self-injury and suicidal ideas.     Objective:   Today's Vitals: BP (!) 116/58   Pulse 68   Temp 97.8 F (36.6 C) (Oral)   Wt 150 lb (68 kg)   SpO2 97%   BMI 24.96 kg/m   Physical Exam Vitals and nursing note reviewed.  Constitutional:      General: He is not in acute distress.    Appearance: Normal appearance. He is not ill-appearing, toxic-appearing or diaphoretic.  HENT:     Mouth/Throat:     Mouth: Mucous membranes are moist.     Pharynx: Oropharynx is clear. No oropharyngeal exudate or posterior oropharyngeal erythema.  Eyes:     General: Scleral icterus present.        Right eye: No discharge.        Left eye: No discharge.     Extraocular Movements: Extraocular movements intact.     Conjunctiva/sclera: Conjunctivae normal.  Cardiovascular:     Rate and Rhythm: Normal rate and regular rhythm.     Pulses: Normal pulses.     Heart sounds: Normal heart sounds. No murmur heard.    No friction rub. No gallop.  Pulmonary:     Effort: Pulmonary effort is normal. No respiratory distress.     Breath sounds: Normal breath sounds. No stridor. No wheezing, rhonchi or rales.  Chest:     Chest wall: No tenderness.  Abdominal:     General: There is no distension.     Palpations: Abdomen is soft.     Tenderness: There is no abdominal tenderness. There is no right CVA tenderness, left CVA tenderness or guarding.  Musculoskeletal:        General: No swelling, tenderness, deformity or signs of injury.     Right lower leg: No edema.     Left lower leg: No edema.  Skin:    General: Skin is warm and dry.     Capillary Refill: Capillary refill takes less than 2 seconds.     Coloration: Skin is not jaundiced or pale.     Findings: No bruising, erythema or lesion.  Neurological:     Mental Status: He is alert and oriented to person, place, and time.     Motor: No weakness.     Coordination: Coordination normal.     Gait: Gait normal.   Psychiatric:        Mood and Affect: Mood normal.        Behavior: Behavior normal.        Thought Content: Thought content normal.        Judgment: Judgment normal.     Assessment & Plan:   Problem List Items Addressed This Visit       Other   Vitamin D deficiency   Last  vitamin D Lab Results  Component Value Date   VD25OH CANCELED 06/09/2023  Checking vitamin D levels today Not on vitamin D supplement      Chronic prescription opiate use    - Sickle Cell Panel - Oxycodone HCl 10 MG TABS; Take 1 tablet (10 mg total) by mouth every 6 (six) hours as needed.  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 Folic acid 1 mg daily to prevent aplastic bone marrow crises.  Will check sickle cell panel for absolute neutrophil count and platelets.  We discussed the need for good hydration, monitoring of hydration status, avoidance of heat, cold, stress, and infection triggers The patient was reminded of the need to seek medical attention of any symptoms of bleeding, anemia, or infection.    Pulmonary evaluation - Patient denies severe recurrent wheezes, shortness of breath with exercise, or persistent cough. If these symptoms develop, pulmonary function tests with spirometry will be ordered, and if abnormal, plan on referral to Pulmonology for further evaluation.   Eye - High risk of proliferative retinopathy. Annual eye  exam with retinal exam recommended to patient.   Acute and chronic painful episodes -  Pt states that she primarily utilizes Ibuprofen for mild pain related to sickle cell anemia.  We discussed that pt is to receive her Schedule II prescriptions only from [place]. Pt is also aware that the prescription history is available to [place] online through the [facility] CSRS. We reminded the patient that all patients receiving Schedule II narcotics must be seen for follow within the 3 months in the office.  We reviewed the terms of our pain agreement, including the need to keep medicines in a safe locked location away from children or pets, and the need to report excess sedation or constipation, measures to avoid constipation, and policies related to early refills and stolen prescriptions. According to the [place] Chronic Pain Initiative program, we have reviewed details related to analgesia, adverse effects, aberrant behaviors. Reviewed [place] Substance Reporting system prior to prescribing opiate medication, no inconsistencies noted.   1. 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  Dispense: 60 tablet; Refill: 0 - hydroxyurea (HYDREA) 500 MG capsule; Take 3 capsules (1,500 mg total) by mouth daily. May take with food to minimize GI side effects.  Dispense: 180 capsule; Refill: 3 - [id number] 11+Oxyco+Alc+Crt-Bund  Follow-up in 3 months      Relevant Medications   Oxycodone HCl 10 MG TABS   hydroxyurea (HYDREA) 500 MG capsule   Other Relevant Orders   Sickle Cell Panel   [id number] 11+Oxyco+Alc+Crt-Bund    Outpatient Encounter Medications as of 11/17/2023  Medication Sig   folic acid (FOLVITE) 1 MG tablet Take 1 tablet (1 mg total)  by mouth daily.   ibuprofen (ADVIL) 800 MG tablet Take 1 tablet (800 mg total) by mouth every 8 (eight) hours as needed.   naloxone (NARCAN) nasal spray 4 mg/0.1 mL Use 1 nasal spray as  a single dose in one nostril; may repeat with a new nasal spray every 2 to 3 minutes in alternating nostrils if needed for oopoidoverdose Dispense: 1 each, Refills: 1 ordered   [DISCONTINUED] hydroxyurea (HYDREA) 500 MG capsule Take 1 capsule (500 mg total) by mouth daily. May take with food to minimize GI side effects.   [DISCONTINUED] oxycodone-acetaminophen (LYNOX) 10-300 MG tablet Take 1 tablet by mouth every 4 (four) hours as needed for pain.   cyclobenzaprine (FLEXERIL) 5 MG tablet Take 1 tablet (5 mg total) by mouth 3 (three) times daily as needed for muscle spasms. (Patient not taking: Reported on 11/17/2023)   hydroxyurea (HYDREA) 500 MG capsule Take 3 capsules (1,500 mg total) by mouth daily. May take with food to minimize GI side effects.   Oxycodone HCl 10 MG TABS Take 1 tablet (10 mg total) by mouth every 6 (six) hours as needed.   No facility-administered encounter medications on file as of 11/17/2023.    Follow-up: Return in about 3 months (around 02/17/2024), or sickle cell disease.   Donell Beers, FNP

## 2023-11-17 NOTE — Assessment & Plan Note (Signed)
-   Sickle Cell Panel - Oxycodone HCl 10 MG TABS; Take 1 tablet (10 mg total) by mouth every 6 (six) hours as needed.  Dispense: 60 tablet; Refill: 0 - naloxone (NARCAN) nasal spray 4 mg/0.1 mL; Use 1 nasal spray as a single dose in one nostril; may repeat with a new nasal spray every 2 to 3 minutes in alternating nostrils if needed for oopoidoverdose Dispense: 1 each, Refills: 1 ordered  Dispense: 1 each; Refill: 1 - [id number] 11+Oxyco+Alc+Crt-Bund

## 2023-11-17 NOTE — Patient Instructions (Signed)
 1. 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  Dispense: 60 tablet; Refill: 0 - hydroxyurea (HYDREA) 500 MG capsule; Take 3 capsules (1,500 mg total) by mouth daily. May take with food to minimize GI side effects.  Dispense: 180 capsule; Refill: 3  2. Chronic prescription opiate use  - Sickle Cell Panel - [id number] 11+Oxyco+Alc+Crt-Bund - Oxycodone HCl 10 MG TABS; Take 1 tablet (10 mg total) by mouth every 6 (six) hours as needed.  Dispense: 60 tablet; Refill: 0 It is important that you exercise regularly at least 30 minutes 5 times a week as tolerated  Think about what you will eat, plan ahead. Choose " clean, green, fresh or frozen" over canned, processed or packaged foods which are more sugary, salty and fatty. 70 to 75% of food eaten should be vegetables and fruit. Three meals at set times with snacks allowed between meals, but they must be fruit or vegetables. Aim to eat over a 12 hour period , example 7 am to 7 pm, and STOP after  your last meal of the day. Drink water,generally about 64 ounces per day, no other drink is as healthy. Fruit juice is best enjoyed in a healthy way, by EATING the fruit.  Thanks for choosing [facility] we consider it a privelige to serve you.

## 2023-11-17 NOTE — Assessment & Plan Note (Addendum)
 Last vitamin D Lab Results  Component Value Date   VD25OH CANCELED 06/09/2023  Checking vitamin D levels today Not on vitamin D supplement

## 2023-11-17 NOTE — Assessment & Plan Note (Signed)
 Sickle cell disease - Continue Hydrea 1500 mg daily. Folic acid 1 mg daily to prevent aplastic bone marrow crises.  Will check sickle cell panel for absolute neutrophil count and platelets.  We discussed the need for good hydration, monitoring of hydration status, avoidance of heat, cold, stress, and infection triggers The patient was reminded of the need to seek medical attention of any symptoms of bleeding, anemia, or infection.    Pulmonary evaluation - Patient denies severe recurrent wheezes, shortness of breath with exercise, or persistent cough. If these symptoms develop, pulmonary function tests with spirometry will be ordered, and if abnormal, plan on referral to Pulmonology for further evaluation.   Eye - High risk of proliferative retinopathy. Annual eye exam with retinal exam recommended to patient.   Acute and chronic painful episodes -  Pt states that she primarily utilizes Ibuprofen for mild pain related to sickle cell anemia.  We discussed that pt is to receive her Schedule II prescriptions only from [place]. Pt is also aware that the prescription history is available to [place] online through the [facility] CSRS. We reminded the patient that all patients receiving Schedule II narcotics must be seen for follow within the 3 months in the office.  We reviewed the terms of our pain agreement, including the need to keep medicines in a safe locked location away from children or pets, and the need to report excess sedation or constipation, measures to avoid constipation, and policies related to early refills and stolen prescriptions. According to the [place] Chronic Pain Initiative program, we have reviewed details related to analgesia, adverse effects, aberrant behaviors. Reviewed [place] Substance Reporting system prior to prescribing opiate medication, no inconsistencies noted.   1. 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  Dispense: 60 tablet; Refill: 0 - hydroxyurea (HYDREA) 500 MG capsule; Take 3 capsules (1,500 mg total) by mouth daily. May take with food to minimize GI side effects.  Dispense: 180 capsule; Refill: 3 - [id number] 11+Oxyco+Alc+Crt-Bund  Follow-up in 3 months

## 2023-11-18 ENCOUNTER — Other Ambulatory Visit: Payer: Self-pay | Admitting: Nurse Practitioner

## 2023-11-18 DIAGNOSIS — E559 Vitamin D deficiency, unspecified: Secondary | ICD-10-CM

## 2023-11-18 LAB — CMP14+CBC/D/PLT+FER+RETIC+V...
ALT: 11 IU/L (ref 0–44)
AST: 28 IU/L (ref 0–40)
Albumin: 4.6 g/dL (ref 4.3–5.2)
Alkaline Phosphatase: 107 IU/L (ref 44–121)
BUN/Creatinine Ratio: 9 (ref 9–20)
BUN: 8 mg/dL (ref 6–20)
Basophils Absolute: 0.1 10*3/uL (ref 0.0–0.2)
Basos: 1 %
Bilirubin Total: 1.5 mg/dL — ABNORMAL HIGH (ref 0.0–1.2)
CO2: 21 mmol/L (ref 20–29)
Calcium: 9.4 mg/dL (ref 8.7–10.2)
Chloride: 105 mmol/L (ref 96–106)
Creatinine, Ser: 0.92 mg/dL (ref 0.76–1.27)
EOS (ABSOLUTE): 0.2 10*3/uL (ref 0.0–0.4)
Eos: 3 %
Ferritin: 596 ng/mL — ABNORMAL HIGH (ref 30–400)
Globulin, Total: 2.3 g/dL (ref 1.5–4.5)
Glucose: 87 mg/dL (ref 70–99)
Hematocrit: 33.6 % — ABNORMAL LOW (ref 37.5–51.0)
Hemoglobin: 11.1 g/dL — ABNORMAL LOW (ref 13.0–17.7)
Immature Grans (Abs): 0 10*3/uL (ref 0.0–0.1)
Immature Granulocytes: 0 %
Lymphocytes Absolute: 2.4 10*3/uL (ref 0.7–3.1)
Lymphs: 33 %
MCH: 35.2 pg — ABNORMAL HIGH (ref 26.6–33.0)
MCHC: 33 g/dL (ref 31.5–35.7)
MCV: 107 fL — ABNORMAL HIGH (ref 79–97)
Monocytes Absolute: 0.7 10*3/uL (ref 0.1–0.9)
Monocytes: 10 %
NRBC: 1 % — ABNORMAL HIGH (ref 0–0)
Neutrophils Absolute: 3.8 10*3/uL (ref 1.4–7.0)
Neutrophils: 53 %
Platelets: 352 10*3/uL (ref 150–450)
Potassium: 4.9 mmol/L (ref 3.5–5.2)
RBC: 3.15 x10E6/uL — ABNORMAL LOW (ref 4.14–5.80)
RDW: 16.4 % — ABNORMAL HIGH (ref 11.6–15.4)
Retic Ct Pct: 6.5 % — ABNORMAL HIGH (ref 0.6–2.6)
Sodium: 141 mmol/L (ref 134–144)
Total Protein: 6.9 g/dL (ref 6.0–8.5)
Vit D, 25-Hydroxy: 21.2 ng/mL — ABNORMAL LOW (ref 30.0–100.0)
WBC: 7.1 10*3/uL (ref 3.4–10.8)
eGFR: 119 mL/min/{1.73_m2} (ref >59)

## 2023-11-18 MED ORDER — VITAMIN D (ERGOCALCIFEROL) 1.25 MG (50000 UNIT) PO CAPS: 50000.0000 [IU] | ORAL_CAPSULE | ORAL | 1 refills | Status: DC

## 2023-11-21 LAB — DRUG SCREEN 764883 11+OXYCO+ALC+CRT-BUND
Amphetamines, Urine: NEGATIVE ng/mL
BENZODIAZ UR QL: NEGATIVE ng/mL
Barbiturate: NEGATIVE ng/mL
Cocaine (Metabolite): NEGATIVE ng/mL
Creatinine: 118 mg/dL (ref 20.0–300.0)
Ethanol: NEGATIVE %
Meperidine: NEGATIVE ng/mL
Methadone Screen, Urine: NEGATIVE ng/mL
OPIATE SCREEN URINE: NEGATIVE ng/mL
Oxycodone/Oxymorphone, Urine: NEGATIVE ng/mL
Phencyclidine: NEGATIVE ng/mL
Propoxyphene: NEGATIVE ng/mL
Tramadol: NEGATIVE ng/mL
pH, Urine: 5.4 (ref 4.5–8.9)

## 2023-11-21 LAB — CANNABINOID CONFIRMATION, UR
CANNABINOIDS: POSITIVE — AB
Carboxy THC GC/MS Conf: 517 ng/mL

## 2024-01-10 ENCOUNTER — Emergency Department (HOSPITAL_COMMUNITY)
Admission: EM | Admit: 2024-01-10 | Discharge: 2024-01-10 | Disposition: A | Discharge status: Home / Self Care | Attending: Emergency Medicine | Admitting: Emergency Medicine

## 2024-01-10 ENCOUNTER — Other Ambulatory Visit: Payer: Self-pay

## 2024-01-10 ENCOUNTER — Encounter (HOSPITAL_COMMUNITY): Payer: Self-pay

## 2024-01-10 DIAGNOSIS — D57219 Sickle-cell/Hb-C disease with crisis, unspecified: Secondary | ICD-10-CM | POA: Insufficient documentation

## 2024-01-10 DIAGNOSIS — J45909 Unspecified asthma, uncomplicated: Secondary | ICD-10-CM | POA: Insufficient documentation

## 2024-01-10 DIAGNOSIS — D5700 Hb-SS disease with crisis, unspecified: Secondary | ICD-10-CM

## 2024-01-10 LAB — CBC WITH DIFFERENTIAL/PLATELET
Abs Immature Granulocytes: 0.12 10*3/uL — ABNORMAL HIGH (ref 0.00–0.07)
Basophils Absolute: 0.1 10*3/uL (ref 0.0–0.1)
Basophils Relative: 1 %
Eosinophils Absolute: 0.2 10*3/uL (ref 0.0–0.5)
Eosinophils Relative: 2 %
HCT: 34.5 % — ABNORMAL LOW (ref 39.0–52.0)
Hemoglobin: 11.7 g/dL — ABNORMAL LOW (ref 13.0–17.0)
Immature Granulocytes: 1 %
Lymphocytes Relative: 23 %
Lymphs Abs: 3.4 10*3/uL (ref 0.7–4.0)
MCH: 34.3 pg — ABNORMAL HIGH (ref 26.0–34.0)
MCHC: 33.9 g/dL (ref 30.0–36.0)
MCV: 101.2 fL — ABNORMAL HIGH (ref 80.0–100.0)
Monocytes Absolute: 1.2 10*3/uL — ABNORMAL HIGH (ref 0.1–1.0)
Monocytes Relative: 8 %
Neutro Abs: 9.5 10*3/uL — ABNORMAL HIGH (ref 1.7–7.7)
Neutrophils Relative %: 65 %
Platelets: 347 10*3/uL (ref 150–400)
RBC: 3.41 MIL/uL — ABNORMAL LOW (ref 4.22–5.81)
RDW: 17.2 % — ABNORMAL HIGH (ref 11.5–15.5)
WBC: 14.5 10*3/uL — ABNORMAL HIGH (ref 4.0–10.5)
nRBC: 2.9 % — ABNORMAL HIGH (ref 0.0–0.2)

## 2024-01-10 LAB — RETICULOCYTES
Immature Retic Fract: 45.4 % — ABNORMAL HIGH (ref 2.3–15.9)
RBC.: 3.4 MIL/uL — ABNORMAL LOW (ref 4.22–5.81)
Retic Count, Absolute: 268 10*3/uL — ABNORMAL HIGH (ref 19.0–186.0)
Retic Ct Pct: 7.5 % — ABNORMAL HIGH (ref 0.4–3.1)

## 2024-01-10 LAB — COMPREHENSIVE METABOLIC PANEL WITH GFR
ALT: 13 U/L (ref 0–44)
AST: 24 U/L (ref 15–41)
Albumin: 4.5 g/dL (ref 3.5–5.0)
Alkaline Phosphatase: 110 U/L (ref 38–126)
Anion gap: 8 (ref 5–15)
BUN: 12 mg/dL (ref 6–20)
CO2: 23 mmol/L (ref 22–32)
Calcium: 9.4 mg/dL (ref 8.9–10.3)
Chloride: 106 mmol/L (ref 98–111)
Creatinine, Ser: 0.96 mg/dL (ref 0.61–1.24)
GFR, Estimated: >60 mL/min (ref >60)
Glucose, Bld: 99 mg/dL (ref 70–99)
Potassium: 3.9 mmol/L (ref 3.5–5.1)
Sodium: 137 mmol/L (ref 135–145)
Total Bilirubin: 1.7 mg/dL — ABNORMAL HIGH (ref 0.0–1.2)
Total Protein: 7.7 g/dL (ref 6.5–8.1)

## 2024-01-10 MED ORDER — HYDROMORPHONE HCL 1 MG/ML IJ SOLN
0.5000 mg | INTRAMUSCULAR | Status: AC
  Administered 2024-01-10: 0.5 mg via INTRAVENOUS
  Filled 2024-01-10: qty 1

## 2024-01-10 MED ORDER — HYDROMORPHONE HCL 1 MG/ML IJ SOLN
1.0000 mg | INTRAMUSCULAR | Status: AC
  Administered 2024-01-10: 1 mg via INTRAVENOUS
  Filled 2024-01-10: qty 1

## 2024-01-10 MED ORDER — KETOROLAC TROMETHAMINE 15 MG/ML IJ SOLN
15.0000 mg | Freq: Once | INTRAMUSCULAR | Status: AC
  Administered 2024-01-10: 15 mg via INTRAVENOUS
  Filled 2024-01-10: qty 1

## 2024-01-10 MED ORDER — ONDANSETRON HCL 4 MG/2ML IJ SOLN
4.0000 mg | INTRAMUSCULAR | Status: DC | PRN
  Administered 2024-01-10: 4 mg via INTRAVENOUS
  Filled 2024-01-10: qty 2

## 2024-01-10 MED ORDER — DEXTROSE-SODIUM CHLORIDE 5-0.45 % IV SOLN: INTRAVENOUS | Status: DC

## 2024-01-10 NOTE — ED Triage Notes (Signed)
 Pt came in for a sickle cell crisis. Pt c/o bilateral knee pain that started Friday. Pt took ibuprofen  and oxycodone  with no relief.

## 2024-01-10 NOTE — ED Provider Notes (Signed)
 [place] EMERGENCY DEPARTMENT AT [facility] Provider Note  CSN: [id number] Arrival date & time: 01/10/24 0435  Chief Complaint(s) Sickle Cell Pain Crisis (/)  HPI Jonathan [name] is a 23 y.o. male with a past medical history listed below here for several days of bilateral knee pain consistent with his prior sickle cell pain.  He reports that he has been out of his oxycodone  since last week.  Has been taking ibuprofen  for the pain but it has not helped this evening prompting his visit.  He denies any falls or trauma.  No swelling.  No fevers or chills.  No other physical complaints.  The history is provided by the patient.    Past Medical History Past Medical History:  Diagnosis Date   Acute chest syndrome(517.3) 08/26/2011   march 2013, Sept 2013   Asthma    Avascular necrosis of bone of left hip (HCC)    Sickle cell disease, type SS [facility])    Patient Active Problem List   Diagnosis Date Noted   Vitamin D  deficiency 11/17/2023   Chronic prescription opiate use 11/17/2023   Hb-SS disease without crisis (HCC) 11/17/2023   History of sickle cell disease 07/18/2023   Bilirubinemia 07/18/2023   Elevated blood pressure reading 07/18/2023   Leukocytosis 07/18/2023   Sickle cell anemia with pain (HCC) 04/13/2023   Elevated AST (SGOT) 08/19/2022   Elevated random blood glucose level 08/19/2022   Chronic pain 06/11/2022   Community acquired pneumonia 02/12/2022   Pulmonary nodule 02/12/2022   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HCC) 09/02/2021   Sickle cell crisis (HCC) 07/27/2021   COVID-19 virus infection 07/27/2021   Anemia of chronic disease 06/27/2021   Thrombocytosis 06/27/2021   Mild intermittent asthma without complication 11/11/2020   Sickle cell disease with crisis (HCC) 10/23/2020   Sickle cell pain crisis (HCC) 08/16/2020   Proteinuria 04/14/2020   Influenza 10/13/2017   Sickle cell disease (HCC) 04/27/2017   Failed hearing screening 03/25/2016   Status  post left hip replacement 03/07/2016   Myopic astigmatism 03/16/2015   Avascular necrosis of bone of left hip (HCC) 03/24/2013   Chronic obstruct airways disease (HCC) 03/15/2013   Snoring 04/08/2012   Hypertension 11/11/2011   Reactive airway disease 11/09/2011   Sickle cell crisis acute chest syndrome (HCC) 07/30/2011   Home Medication(s) Prior to Admission medications   Medication Sig Start Date End Date Taking? Authorizing Provider  cyclobenzaprine  (FLEXERIL ) 5 MG tablet Take 1 tablet (5 mg total) by mouth 3 (three) times daily as needed for muscle spasms. Patient not taking: Reported on 11/17/2023 07/20/23   Sigurd Driver, FNP  hydroxyurea  (HYDREA ) 500 MG capsule Take 3 capsules (1,500 mg total) by mouth daily. May take with food to minimize GI side effects. 11/17/23   Paseda, Folashade R, FNP  ibuprofen  (ADVIL ) 800 MG tablet Take 1 tablet (800 mg total) by mouth every 8 (eight) hours as needed. 09/02/23   Paseda, Folashade R, FNP  naloxone  (NARCAN ) nasal spray 4 mg/0.1 mL Use 1 nasal spray as a single dose in one nostril; may repeat with a new nasal spray every 2 to 3 minutes in alternating nostrils if needed for oopoidoverdose Dispense: 1 each, Refills: 1 ordered 11/17/23   Paseda, Folashade R, FNP  Oxycodone  HCl 10 MG TABS Take 1 tablet (10 mg total) by mouth every 6 (six) hours as needed. 11/17/23   Paseda, Folashade R, FNP  Vitamin D , Ergocalciferol , (DRISDOL ) 1.25 MG (50000 UNIT) CAPS capsule Take  1 capsule (50,000 Units total) by mouth every 7 (seven) days. 11/18/23   Paseda, Folashade R, FNP                                                                                                                                    Allergies Allergy medication [diphenhydramine ]  Review of Systems Review of Systems As noted in HPI  Physical Exam Vital Signs  I have reviewed the triage vital signs BP (!) 149/89   Pulse 63   Temp 98.1 F (36.7 C)   Resp 20   SpO2 95%   Physical  Exam Vitals reviewed.  Constitutional:      General: He is not in acute distress.    Appearance: He is well-developed. He is not diaphoretic.  HENT:     Head: Normocephalic and atraumatic.     Right Ear: External ear normal.     Left Ear: External ear normal.     Nose: Nose normal.     Mouth/Throat:     Mouth: Mucous membranes are moist.  Eyes:     General: No scleral icterus.    Conjunctiva/sclera: Conjunctivae normal.  Neck:     Trachea: Phonation normal.  Cardiovascular:     Rate and Rhythm: Normal rate and regular rhythm.  Pulmonary:     Effort: Pulmonary effort is normal. No respiratory distress.     Breath sounds: No stridor.  Abdominal:     General: There is no distension.  Musculoskeletal:        General: Normal range of motion.     Cervical back: Normal range of motion.     Right knee: No swelling or erythema. No tenderness.     Left knee: No swelling or erythema. No tenderness.  Neurological:     Mental Status: He is alert and oriented to person, place, and time.  Psychiatric:        Behavior: Behavior normal.     ED Results and Treatments Labs (all labs ordered are listed, but only abnormal results are displayed) Labs Reviewed  COMPREHENSIVE METABOLIC PANEL WITH GFR - Abnormal; Notable for the following components:      Result Value   Total Bilirubin 1.7 (*)    All other components within normal limits  CBC WITH DIFFERENTIAL/PLATELET - Abnormal; Notable for the following components:   WBC 14.5 (*)    RBC 3.41 (*)    Hemoglobin 11.7 (*)    HCT 34.5 (*)    MCV 101.2 (*)    MCH 34.3 (*)    RDW 17.2 (*)    nRBC 2.9 (*)    Neutro Abs 9.5 (*)    Monocytes Absolute 1.2 (*)    Abs Immature Granulocytes 0.12 (*)    All other components within normal limits  RETICULOCYTES - Abnormal; Notable for the following components:   Retic Ct Pct 7.5 (*)    RBC. 3.40 (*)  Retic Count, Absolute 268.0 (*)    Immature Retic Fract 45.4 (*)    All other components  within normal limits                                                                                                                         EKG  EKG Interpretation Date/Time:    Ventricular Rate:    PR Interval:    QRS Duration:    QT Interval:    QTC Calculation:   R Axis:      Text Interpretation:         Radiology No results found.  Medications Ordered in ED Medications  dextrose  5 % and 0.45 % NaCl infusion ( Intravenous New Bag/Given 01/10/24 0517)  ondansetron  (ZOFRAN ) injection 4 mg (4 mg Intravenous Given 01/10/24 0519)  ketorolac  (TORADOL ) 15 MG/ML injection 15 mg (has no administration in time range)  HYDROmorphone  (DILAUDID ) injection 0.5 mg (0.5 mg Intravenous Given 01/10/24 0518)  HYDROmorphone  (DILAUDID ) injection 1 mg (1 mg Intravenous Given 01/10/24 0556)  HYDROmorphone  (DILAUDID ) injection 1 mg (1 mg Intravenous Given 01/10/24 0981)   Procedures Procedures  (including critical care time) Medical Decision Making / ED Course   Medical Decision Making Amount and/or Complexity of Data Reviewed Labs: ordered. Decision-making details documented in ED Course.  Risk Prescription drug management. Parenteral controlled substances. Decision regarding hospitalization.    Bilateral knee pain Most consistent with sickle cell pain crisis.  No swelling concerning for DVT.  No evidence of infectious process.  Doubt arterial occlusion.  Patient provided with IV fluids and IV pain medicine.  Labs grossly reassuring with baseline hemoglobin and bilirubin.  No evidence of hemolytic crisis or aplastic crisis.  Pain improved significantly with treatment above.     Final Clinical Impression(s) / ED Diagnoses Final diagnoses:  Sickle cell anemia with pain [facility])   The patient appears reasonably screened and/or stabilized for discharge and I doubt any other medical condition or other [facility] requiring further screening, evaluation, or treatment in the ED at this time. I have  discussed the findings, Dx and Tx plan with the patient/family who expressed understanding and agree(s) with the plan. Discharge instructions discussed at length. The patient/family was given strict return precautions who verbalized understanding of the instructions. No further questions at time of discharge.  Disposition: Discharge  Condition: Good  ED Discharge Orders     None        Follow Up: Paseda, Folashade R, FNP [address] [place],  [address] [phone]  Call  to schedule an appointment for close follow up     This chart was dictated using voice recognition software.  Despite best efforts to proofread,  errors can occur which can change the documentation meaning.    Lindle Rhea, MD 01/10/24 0700

## 2024-01-11 ENCOUNTER — Other Ambulatory Visit: Payer: Self-pay

## 2024-01-11 ENCOUNTER — Encounter (HOSPITAL_COMMUNITY): Payer: Self-pay | Admitting: Emergency Medicine

## 2024-01-11 ENCOUNTER — Observation Stay (HOSPITAL_COMMUNITY)
Admission: EM | Admit: 2024-01-11 | Discharge: 2024-01-12 | Disposition: A | Discharge status: Home / Self Care | Attending: Emergency Medicine | Admitting: Emergency Medicine

## 2024-01-11 ENCOUNTER — Emergency Department (HOSPITAL_COMMUNITY)

## 2024-01-11 DIAGNOSIS — M25562 Pain in left knee: Secondary | ICD-10-CM | POA: Diagnosis not present

## 2024-01-11 DIAGNOSIS — D5700 Hb-SS disease with crisis, unspecified: Principal | ICD-10-CM | POA: Insufficient documentation

## 2024-01-11 DIAGNOSIS — Z79899 Other long term (current) drug therapy: Secondary | ICD-10-CM | POA: Insufficient documentation

## 2024-01-11 DIAGNOSIS — J45909 Unspecified asthma, uncomplicated: Secondary | ICD-10-CM | POA: Diagnosis not present

## 2024-01-11 DIAGNOSIS — D72829 Elevated white blood cell count, unspecified: Secondary | ICD-10-CM | POA: Diagnosis not present

## 2024-01-11 DIAGNOSIS — D638 Anemia in other chronic diseases classified elsewhere: Secondary | ICD-10-CM | POA: Insufficient documentation

## 2024-01-11 DIAGNOSIS — G8929 Other chronic pain: Secondary | ICD-10-CM | POA: Diagnosis present

## 2024-01-11 DIAGNOSIS — G894 Chronic pain syndrome: Secondary | ICD-10-CM | POA: Diagnosis not present

## 2024-01-11 DIAGNOSIS — M25561 Pain in right knee: Secondary | ICD-10-CM | POA: Diagnosis present

## 2024-01-11 DIAGNOSIS — Z96642 Presence of left artificial hip joint: Secondary | ICD-10-CM | POA: Insufficient documentation

## 2024-01-11 LAB — CBC WITH DIFFERENTIAL/PLATELET
Abs Immature Granulocytes: 0.05 10*3/uL (ref 0.00–0.07)
Basophils Absolute: 0.1 10*3/uL (ref 0.0–0.1)
Basophils Relative: 1 %
Eosinophils Absolute: 0.1 10*3/uL (ref 0.0–0.5)
Eosinophils Relative: 1 %
HCT: 31.8 % — ABNORMAL LOW (ref 39.0–52.0)
Hemoglobin: 11.1 g/dL — ABNORMAL LOW (ref 13.0–17.0)
Immature Granulocytes: 0 %
Lymphocytes Relative: 10 %
Lymphs Abs: 1.4 10*3/uL (ref 0.7–4.0)
MCH: 34 pg (ref 26.0–34.0)
MCHC: 34.9 g/dL (ref 30.0–36.0)
MCV: 97.5 fL (ref 80.0–100.0)
Monocytes Absolute: 1.2 10*3/uL — ABNORMAL HIGH (ref 0.1–1.0)
Monocytes Relative: 9 %
Neutro Abs: 11.2 10*3/uL — ABNORMAL HIGH (ref 1.7–7.7)
Neutrophils Relative %: 79 %
Platelets: 307 10*3/uL (ref 150–400)
RBC: 3.26 MIL/uL — ABNORMAL LOW (ref 4.22–5.81)
RDW: 16.2 % — ABNORMAL HIGH (ref 11.5–15.5)
WBC: 14.1 10*3/uL — ABNORMAL HIGH (ref 4.0–10.5)
nRBC: 3.2 % — ABNORMAL HIGH (ref 0.0–0.2)

## 2024-01-11 LAB — COMPREHENSIVE METABOLIC PANEL WITH GFR
ALT: 13 U/L (ref 0–44)
AST: 24 U/L (ref 15–41)
Albumin: 4.3 g/dL (ref 3.5–5.0)
Alkaline Phosphatase: 103 U/L (ref 38–126)
Anion gap: 8 (ref 5–15)
BUN: 10 mg/dL (ref 6–20)
CO2: 23 mmol/L (ref 22–32)
Calcium: 9.1 mg/dL (ref 8.9–10.3)
Chloride: 105 mmol/L (ref 98–111)
Creatinine, Ser: 0.77 mg/dL (ref 0.61–1.24)
GFR, Estimated: >60 mL/min (ref >60)
Glucose, Bld: 115 mg/dL — ABNORMAL HIGH (ref 70–99)
Potassium: 3.7 mmol/L (ref 3.5–5.1)
Sodium: 136 mmol/L (ref 135–145)
Total Bilirubin: 1.7 mg/dL — ABNORMAL HIGH (ref 0.0–1.2)
Total Protein: 7.9 g/dL (ref 6.5–8.1)

## 2024-01-11 LAB — RETICULOCYTES
Immature Retic Fract: 45.2 % — ABNORMAL HIGH (ref 2.3–15.9)
RBC.: 3.27 MIL/uL — ABNORMAL LOW (ref 4.22–5.81)
Retic Count, Absolute: 270.5 10*3/uL — ABNORMAL HIGH (ref 19.0–186.0)
Retic Ct Pct: 8.1 % — ABNORMAL HIGH (ref 0.4–3.1)

## 2024-01-11 LAB — C-REACTIVE PROTEIN: CRP: 9.7 mg/dL — ABNORMAL HIGH (ref <1.0)

## 2024-01-11 LAB — SEDIMENTATION RATE: Sed Rate: 12 mm/h (ref 0–16)

## 2024-01-11 MED ORDER — DIPHENHYDRAMINE HCL 25 MG PO CAPS: 25.0000 mg | ORAL_CAPSULE | ORAL | Status: DC | PRN

## 2024-01-11 MED ORDER — LACTATED RINGERS IV BOLUS
500.0000 mL | Freq: Once | INTRAVENOUS | Status: AC
  Administered 2024-01-11: 500 mL via INTRAVENOUS

## 2024-01-11 MED ORDER — SENNOSIDES-DOCUSATE SODIUM 8.6-50 MG PO TABS
1.0000 | ORAL_TABLET | Freq: Two times a day (BID) | ORAL | Status: DC
  Administered 2024-01-11 – 2024-01-12 (×2): 1 via ORAL
  Filled 2024-01-11 (×2): qty 1

## 2024-01-11 MED ORDER — KETOROLAC TROMETHAMINE 15 MG/ML IJ SOLN
15.0000 mg | Freq: Four times a day (QID) | INTRAMUSCULAR | Status: DC
  Administered 2024-01-11 – 2024-01-12 (×3): 15 mg via INTRAVENOUS
  Filled 2024-01-11 (×3): qty 1

## 2024-01-11 MED ORDER — ONDANSETRON HCL 4 MG/2ML IJ SOLN
4.0000 mg | INTRAMUSCULAR | Status: DC | PRN
  Administered 2024-01-11: 4 mg via INTRAVENOUS
  Filled 2024-01-11: qty 2

## 2024-01-11 MED ORDER — HYDROMORPHONE HCL 1 MG/ML IJ SOLN
2.0000 mg | INTRAMUSCULAR | Status: DC | PRN
  Administered 2024-01-11: 2 mg via INTRAVENOUS
  Filled 2024-01-11: qty 2

## 2024-01-11 MED ORDER — HYDROMORPHONE HCL 1 MG/ML IJ SOLN: 2.0000 mg | INTRAMUSCULAR | Status: DC

## 2024-01-11 MED ORDER — HYDROMORPHONE HCL 1 MG/ML IJ SOLN
1.0000 mg | INTRAMUSCULAR | Status: AC
  Administered 2024-01-11: 1 mg via INTRAVENOUS
  Filled 2024-01-11: qty 1

## 2024-01-11 MED ORDER — OXYCODONE HCL 5 MG PO TABS
10.0000 mg | ORAL_TABLET | Freq: Four times a day (QID) | ORAL | Status: DC | PRN
  Administered 2024-01-11 – 2024-01-12 (×3): 10 mg via ORAL
  Filled 2024-01-11 (×3): qty 2

## 2024-01-11 MED ORDER — KETOROLAC TROMETHAMINE 15 MG/ML IJ SOLN
15.0000 mg | INTRAMUSCULAR | Status: AC
Start: 2024-01-11 — End: 2024-01-11
  Administered 2024-01-11: 15 mg via INTRAVENOUS
  Filled 2024-01-11: qty 1

## 2024-01-11 MED ORDER — CYCLOBENZAPRINE HCL 5 MG PO TABS: 5.0000 mg | ORAL_TABLET | Freq: Three times a day (TID) | ORAL | Status: DC | PRN

## 2024-01-11 MED ORDER — HYDROMORPHONE 1 MG/ML IV SOLN
INTRAVENOUS | Status: DC
  Administered 2024-01-11: 4.5 mg via INTRAVENOUS
  Administered 2024-01-11: 3.5 mg via INTRAVENOUS
  Administered 2024-01-11: 4.5 mg via INTRAVENOUS
  Administered 2024-01-11 – 2024-01-12 (×2): 30 mg via INTRAVENOUS
  Administered 2024-01-12: 3.5 mg via INTRAVENOUS
  Administered 2024-01-12: 5 mg via INTRAVENOUS
  Filled 2024-01-11 (×2): qty 30

## 2024-01-11 MED ORDER — HYDROXYUREA 500 MG PO CAPS
1500.0000 mg | ORAL_CAPSULE | Freq: Every day | ORAL | Status: DC
  Administered 2024-01-12: 1500 mg via ORAL
  Filled 2024-01-11: qty 3

## 2024-01-11 MED ORDER — NALOXONE HCL 0.4 MG/ML IJ SOLN: 0.4000 mg | INTRAMUSCULAR | Status: DC | PRN

## 2024-01-11 MED ORDER — POLYETHYLENE GLYCOL 3350 17 G PO PACK: 17.0000 g | PACK | Freq: Every day | ORAL | Status: DC | PRN

## 2024-01-11 MED ORDER — SODIUM CHLORIDE 0.9% FLUSH: 9.0000 mL | INTRAVENOUS | Status: DC | PRN

## 2024-01-11 NOTE — H&P (Signed)
 H&P  Patient Demographics:  Jonathan [name], is a 23 y.o. male  MRN: [id number]   DOB - 19-Nov-2000  Admit Date - 01/11/2024  Outpatient Primary MD for the patient is Paseda, Folashade R, FNP  Chief Complaint  Patient presents with   Sickle Cell Pain Crisis   Knee Pain      HPI:   Jonathan [name]  is a 23 y.o. male, with a medical history significant for sickle cell disease that presented to the emergency department for complaints of bilateral lower extremity pain and back pain that started this morning. The patient presented to the emergency department when his pain did not respond well to ibuprofen  and oxycodone . He rates his pain as 9/10 Denies any fever, chills, cough. Nausea or vomiting. No sick contacts or recent travels.      ED course: Patient was treated in the emergency department with IV fluid and pain medication with no significant improvement to pain . Patient admitted to in patient for ongoing sickle pain management.  BP (!) 166/108 (BP Location: Left Arm)  Pulse 75  Temp 98.2 F (36.8 C) (Oral)  Resp 18  Ht 5\' 5"  (1.651 m)  Wt 68 kg  SpO2 100%  BMI 24.96 kg/m  Labs Reviewed  CBC WITH DIFFERENTIAL/PLATELET - Abnormal; Notable for the following components:      Result Value   WBC 14.1 (*)    RBC 3.26 (*)    Hemoglobin 11.1 (*)    HCT 31.8 (*)    RDW 16.2 (*)    nRBC 3.2 (*)    Neutro Abs 11.2 (*)    Monocytes Absolute 1.2 (*)    All other components within normal limits  C-REACTIVE PROTEIN - Abnormal; Notable for the following components:   CRP 9.7 (*)    All other components within normal limits  COMPREHENSIVE METABOLIC PANEL WITH GFR - Abnormal; Notable for the following components:   Glucose, Bld 115 (*)    Total Bilirubin 1.7 (*)    All other components within normal limits  RETICULOCYTES - Abnormal; Notable for the following components:   Retic Ct Pct 8.1 (*)    RBC. 3.27 (*)    Retic Count, Absolute 270.5 (*)    Immature Retic Fract 45.2 (*)    All  other components within normal limits  CULTURE, BLOOD (ROUTINE X 2)  CULTURE, BLOOD (ROUTINE X 2)  SEDIMENTATION RATE       Review of systems:  In addition to the HPI above, patient reports No fever or chills No Headache, No changes with vision or hearing No problems swallowing food or liquids No chest pain, cough or shortness of breath No abdominal pain, No nausea or vomiting, Bowel movements are regular No blood in stool or urine No dysuria No new skin rashes or bruises No new joints pains-aches No new weakness, tingling, numbness in any extremity No recent weight gain or loss No polyuria, polydypsia or polyphagia No significant Mental Stressors  A full 10 point Review of Systems was done, except as stated above, all other Review of Systems were negative.  With Past History of the following :   Past Medical History:  Diagnosis Date   Acute chest syndrome(517.3) 08/26/2011   march 2013, Sept 2013   Asthma    Avascular necrosis of bone of left hip (HCC)    Sickle cell disease, type SS (HCC)       Past Surgical History:  Procedure Laterality Date   ADENOIDECTOMY  SPLENECTOMY     TONSILLECTOMY     TONSILLECTOMY AND ADENOIDECTOMY     TOTAL HIP ARTHROPLASTY Left 03/07/2016   UMBILICAL HERNIA REPAIR       Social History:   Social History   Tobacco Use   Smoking status: Never   Smokeless tobacco: Never  Substance Use Topics   Alcohol use: No    Alcohol/week: 0.0 standard drinks of alcohol     Lives - At home   Family History :   Family History  Problem Relation Age of Onset   Hypertension Mother    Hypertension Maternal Grandmother    Hypertension Maternal Grandfather    Asthma Paternal Grandfather    Diabetes Paternal Grandfather      Home Medications:   Prior to Admission medications   Medication Sig Start Date End Date Taking? Authorizing Provider  cyclobenzaprine  (FLEXERIL ) 5 MG tablet Take 1 tablet (5 mg total) by mouth 3 (three) times daily  as needed for muscle spasms. Patient not taking: Reported on 11/17/2023 07/20/23   Sigurd Driver, FNP  hydroxyurea  (HYDREA ) 500 MG capsule Take 3 capsules (1,500 mg total) by mouth daily. May take with food to minimize GI side effects. 11/17/23   Paseda, Folashade R, FNP  ibuprofen  (ADVIL ) 800 MG tablet Take 1 tablet (800 mg total) by mouth every 8 (eight) hours as needed. 09/02/23   Paseda, Folashade R, FNP  naloxone  (NARCAN ) nasal spray 4 mg/0.1 mL Use 1 nasal spray as a single dose in one nostril; may repeat with a new nasal spray every 2 to 3 minutes in alternating nostrils if needed for oopoidoverdose Dispense: 1 each, Refills: 1 ordered 11/17/23   Paseda, Folashade R, FNP  Oxycodone  HCl 10 MG TABS Take 1 tablet (10 mg total) by mouth every 6 (six) hours as needed. 11/17/23   Paseda, Folashade R, FNP  Vitamin D , Ergocalciferol , (DRISDOL ) 1.25 MG (50000 UNIT) CAPS capsule Take 1 capsule (50,000 Units total) by mouth every 7 (seven) days. 11/18/23   Paseda, Folashade R, FNP     Allergies:   Allergies  Allergen Reactions   Allergy Medication [Diphenhydramine ] Swelling and Other (See Comments)    Face swells     Physical Exam:   Vitals:   Vitals:   01/11/24 1242 01/11/24 1331  BP: (!) 147/92   Pulse: 78   Resp: 15 20  Temp: 98.1 F (36.7 C)   SpO2: 95% 95%    Physical Exam: Constitutional: Patient appears well-developed and well-nourished. Not in obvious distress. HENT: Normocephalic, atraumatic, External right and left ear normal. Oropharynx is clear and moist.  Eyes: Conjunctivae and EOM are normal. PERRLA, no scleral icterus. Neck: Normal ROM. Neck supple. No JVD. No tracheal deviation. No thyromegaly. CVS: RRR, S1/S2 +, no murmurs, no gallops, no carotid bruit.  Pulmonary: Effort and breath sounds normal, no stridor, rhonchi, wheezes, rales.  Abdominal: Soft. BS +, no distension, tenderness, rebound or guarding.  Musculoskeletal: Normal range of motion. No edema and no  tenderness.  Lymphadenopathy: No lymphadenopathy noted, cervical, inguinal or axillary Neuro: Alert. Normal reflexes, muscle tone coordination. No cranial nerve deficit. Skin: Skin is warm and dry. No rash noted. Not diaphoretic. No erythema. No pallor. Psychiatric: Normal mood and affect. Behavior, judgment, thought content normal.   Data Review:   CBC Recent Labs  Lab 01/10/24 0444 01/11/24 0753  WBC 14.5* 14.1*  HGB 11.7* 11.1*  HCT 34.5* 31.8*  PLT 347 307  MCV 101.2* 97.5  MCH 34.3* 34.0  MCHC 33.9 34.9  RDW 17.2* 16.2*  LYMPHSABS 3.4 1.4  MONOABS 1.2* 1.2*  EOSABS 0.2 0.1  BASOSABS 0.1 0.1   ------------------------------------------------------------------------------------------------------------------  Chemistries  Recent Labs  Lab 01/10/24 0444 01/11/24 0753  NA 137 136  K 3.9 3.7  CL 106 105  CO2 23 23  GLUCOSE 99 115*  BUN 12 10  CREATININE 0.96 0.77  CALCIUM 9.4 9.1  AST 24 24  ALT 13 13  ALKPHOS 110 103  BILITOT 1.7* 1.7*   ------------------------------------------------------------------------------------------------------------------ estimated creatinine clearance is 123.9 mL/min (by C-G formula based on SCr of 0.77 mg/dL). ------------------------------------------------------------------------------------------------------------------ No results for input(s): "TSH", "T4TOTAL", "T3FREE", "THYROIDAB" in the last 72 hours.  Invalid input(s): "FREET3"  Coagulation profile No results for input(s): "INR", "PROTIME" in the last 168 hours. ------------------------------------------------------------------------------------------------------------------- No results for input(s): "DDIMER" in the last 72 hours. -------------------------------------------------------------------------------------------------------------------  Cardiac Enzymes No results for input(s): "CKMB", "TROPONINI", "MYOGLOBIN" in the last 168 hours.  Invalid input(s):  "CK" ------------------------------------------------------------------------------------------------------------------ No results found for: "BNP"  ---------------------------------------------------------------------------------------------------------------  Urinalysis    Component Value Date/Time   COLORURINE YELLOW 07/17/2023 1935   APPEARANCEUR CLEAR 07/17/2023 1935   LABSPEC 1.011 07/17/2023 1935   PHURINE 6.0 07/17/2023 1935   GLUCOSEU NEGATIVE 07/17/2023 1935   HGBUR NEGATIVE 07/17/2023 1935   BILIRUBINUR NEGATIVE 07/17/2023 1935   BILIRUBINUR negative 04/12/2020 1159   KETONESUR NEGATIVE 07/17/2023 1935   PROTEINUR NEGATIVE 07/17/2023 1935   UROBILINOGEN 1.0 04/12/2020 1159   UROBILINOGEN 1.0 04/04/2015 1712   NITRITE NEGATIVE 07/17/2023 1935   LEUKOCYTESUR NEGATIVE 07/17/2023 1935    ----------------------------------------------------------------------------------------------------------------   Imaging Results:    DG Knee Complete 4 Views Left Result Date: 01/11/2024 CLINICAL DATA:  23 year old gentleman with history of sickle cell presents with the BILATERAL knee pain EXAM: LEFT KNEE - COMPLETE 4+ VIEW; RIGHT KNEE - COMPLETE 4+ VIEW COMPARISON:  None available FINDINGS: RIGHT knee: No fracture or dislocation. Soft tissues are normal. Areas of curvilinear sclerosis in the distal femur are likely sites of bony infarction. LEFT knee: No fracture or dislocation. Sclerosis of the distal femur and proximal tibia are likely sites of bony infarction. Soft tissues are normal. IMPRESSION: 1. No fracture or dislocation of the knees. 2. 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 Electronically Signed   By: Elester Grim M.D.   On: 01/11/2024 08:43   DG Knee Complete 4 Views Right Result Date: 01/11/2024 CLINICAL DATA:  23 year old gentleman with history of sickle cell presents with the BILATERAL knee pain  EXAM: LEFT KNEE - COMPLETE 4+ VIEW; RIGHT KNEE - COMPLETE 4+ VIEW COMPARISON:  None available FINDINGS: RIGHT knee: No fracture or dislocation. Soft tissues are normal. Areas of curvilinear sclerosis in the distal femur are likely sites of bony infarction. LEFT knee: No fracture or dislocation. Sclerosis of the distal femur and proximal tibia are likely sites of bony infarction. Soft tissues are normal. IMPRESSION: 1. No fracture or dislocation of the knees. 2. 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 Electronically Signed   By: Elester Grim M.D.   On: 01/11/2024 08:43     Assessment & Plan:  Principal Problem:   Sickle-cell disease with pain (HCC) Active Problems:   Sickle cell pain crisis (HCC)   Anemia of chronic disease   Chronic pain   Leukocytosis   Hb Sickle Cell Disease with crisis: Admit patient, start IVF 0.45% Saline @  KVO mls/hour, start weight based Dilaudid  PCA, start IV Toradol  15 mg Q 6 H, Restart oral home pain medications, Monitor vitals very closely, Re-evaluate pain scale regularly, 2 L of Oxygen  by [place], Patient will be re-evaluated for pain in the context of function and relationship to baseline as care progresses. Leukocytosis: Slightly elevated, no S/S of acute infection. Will continue to monitor. Daily CBC in place  Anemia of Chronic Disease: Hgb stable at 11.1 g/dl  Chronic pain Syndrome: Continue oral home medication    DVT Prophylaxis: Subcut Lovenox    AM Labs Ordered, also please review Full Orders  Family Communication: Admission, patient's condition and plan of care including tests being ordered have been discussed with the patient who indicate understanding and agree with the plan and Code Status.  Code Status: Full Code  Consults called: None    Admission status: Inpatient    Time spent in minutes : 50 minutes  Lorel Roes NP  01/11/2024 at 4:00 PM

## 2024-01-11 NOTE — ED Provider Notes (Signed)
 [facility] Provider Note   CSN: [id number] Arrival date & time: 01/11/24  4098     History  Chief Complaint  Patient presents with   Sickle Cell Pain Crisis   Knee Pain    Jonathan [name] is a 23 y.o. male.   Sickle Cell Pain Crisis Knee Pain Patient presents for pain in bilateral knees.  Medical history includes sickle cell anemia, asthma, left hip avascular necrosis, chronic opiate use.  He was seen in the ED early yesterday morning for several days of bilateral knee pain.  This was in the setting of having run out of his home oxycodone .  He received multiple doses of IV Dilaudid  with subsequent improvement in pain.  Since return home, his pain is worsened.  He continues to be localized to bilateral knees.  He has not taken anything for pain this morning.     Home Medications Prior to Admission medications   Medication Sig Start Date End Date Taking? Authorizing Provider  cyclobenzaprine  (FLEXERIL ) 5 MG tablet Take 1 tablet (5 mg total) by mouth 3 (three) times daily as needed for muscle spasms. Patient not taking: Reported on 11/17/2023 07/20/23   Sigurd Driver, FNP  hydroxyurea  (HYDREA ) 500 MG capsule Take 3 capsules (1,500 mg total) by mouth daily. May take with food to minimize GI side effects. 11/17/23   Paseda, Folashade R, FNP  ibuprofen  (ADVIL ) 800 MG tablet Take 1 tablet (800 mg total) by mouth every 8 (eight) hours as needed. 09/02/23   Paseda, Folashade R, FNP  naloxone  (NARCAN ) nasal spray 4 mg/0.1 mL Use 1 nasal spray as a single dose in one nostril; may repeat with a new nasal spray every 2 to 3 minutes in alternating nostrils if needed for oopoidoverdose Dispense: 1 each, Refills: 1 ordered 11/17/23   Paseda, Folashade R, FNP  Oxycodone  HCl 10 MG TABS Take 1 tablet (10 mg total) by mouth every 6 (six) hours as needed. 11/17/23   Paseda, Folashade R, FNP  Vitamin D , Ergocalciferol , (DRISDOL ) 1.25 MG (50000 UNIT) CAPS  capsule Take 1 capsule (50,000 Units total) by mouth every 7 (seven) days. 11/18/23   Paseda, Folashade R, FNP      Allergies    Allergy medication [diphenhydramine ]    Review of Systems   Review of Systems  Musculoskeletal:  Positive for arthralgias.  All other systems reviewed and are negative.   Physical Exam Updated Vital Signs BP (!) 147/92 Comment: LPN notified  Pulse 78   Temp 98.1 F (36.7 C) (Oral)   Resp 20   Ht 5\' 5"  (1.651 m)   Wt 68 kg   SpO2 95%   BMI 24.96 kg/m  Physical Exam Vitals and nursing note reviewed.  Constitutional:      General: He is not in acute distress.    Appearance: Normal appearance. He is well-developed. He is not ill-appearing, toxic-appearing or diaphoretic.  HENT:     Head: Normocephalic and atraumatic.     Right Ear: External ear normal.     Left Ear: External ear normal.     Nose: Nose normal.     Mouth/Throat:     Mouth: Mucous membranes are moist.  Eyes:     Extraocular Movements: Extraocular movements intact.     Conjunctiva/sclera: Conjunctivae normal.  Cardiovascular:     Rate and Rhythm: Normal rate and regular rhythm.  Pulmonary:     Effort: Pulmonary effort is normal. No respiratory distress.  Abdominal:  General: There is no distension.     Palpations: Abdomen is soft.     Tenderness: There is no abdominal tenderness.  Musculoskeletal:        General: Tenderness present. No swelling or deformity.     Cervical back: Neck supple.  Skin:    General: Skin is warm and dry.     Coloration: Skin is not jaundiced or pale.  Neurological:     General: No focal deficit present.     Mental Status: He is alert and oriented to person, place, and time.  Psychiatric:        Mood and Affect: Mood normal.        Behavior: Behavior normal.     ED Results / Procedures / Treatments   Labs (all labs ordered are listed, but only abnormal results are displayed) Labs Reviewed  CBC WITH DIFFERENTIAL/PLATELET - Abnormal; Notable  for the following components:      Result Value   WBC 14.1 (*)    RBC 3.26 (*)    Hemoglobin 11.1 (*)    HCT 31.8 (*)    RDW 16.2 (*)    nRBC 3.2 (*)    Neutro Abs 11.2 (*)    Monocytes Absolute 1.2 (*)    All other components within normal limits  C-REACTIVE PROTEIN - Abnormal; Notable for the following components:   CRP 9.7 (*)    All other components within normal limits  COMPREHENSIVE METABOLIC PANEL WITH GFR - Abnormal; Notable for the following components:   Glucose, Bld 115 (*)    Total Bilirubin 1.7 (*)    All other components within normal limits  RETICULOCYTES - Abnormal; Notable for the following components:   Retic Ct Pct 8.1 (*)    RBC. 3.27 (*)    Retic Count, Absolute 270.5 (*)    Immature Retic Fract 45.2 (*)    All other components within normal limits  CULTURE, BLOOD (ROUTINE X 2)  CULTURE, BLOOD (ROUTINE X 2)  SEDIMENTATION RATE    EKG None  Radiology DG Knee Complete 4 Views Left Result Date: 01/11/2024 CLINICAL DATA:  23 year old gentleman with history of sickle cell presents with the BILATERAL knee pain EXAM: LEFT KNEE - COMPLETE 4+ VIEW; RIGHT KNEE - COMPLETE 4+ VIEW COMPARISON:  None available FINDINGS: RIGHT knee: No fracture or dislocation. Soft tissues are normal. Areas of curvilinear sclerosis in the distal femur are likely sites of bony infarction. LEFT knee: No fracture or dislocation. Sclerosis of the distal femur and proximal tibia are likely sites of bony infarction. Soft tissues are normal. IMPRESSION: 1. No fracture or dislocation of the knees. 2. 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 Electronically Signed   By: Elester Grim M.D.   On: 01/11/2024 08:43   DG Knee Complete 4 Views Right Result Date: 01/11/2024 CLINICAL DATA:  23 year old gentleman with history of sickle cell presents with the BILATERAL knee pain EXAM: LEFT KNEE - COMPLETE 4+ VIEW; RIGHT KNEE -  COMPLETE 4+ VIEW COMPARISON:  None available FINDINGS: RIGHT knee: No fracture or dislocation. Soft tissues are normal. Areas of curvilinear sclerosis in the distal femur are likely sites of bony infarction. LEFT knee: No fracture or dislocation. Sclerosis of the distal femur and proximal tibia are likely sites of bony infarction. Soft tissues are normal. IMPRESSION: 1. No fracture or dislocation of the knees. 2. Increased sclerosis of the RIGHT distal femur, LEFT distal femur and proximal  tibia are consistent with bony infarction. Electronically Signed   By: Elester Grim M.D.   On: 01/11/2024 08:43    Procedures Procedures    Medications Ordered in ED Medications  HYDROmorphone  (DILAUDID ) injection 2 mg (2 mg Intravenous Given 01/11/24 1209)  Oxycodone  HCl TABS 10 mg (has no administration in time range)  hydroxyurea  (HYDREA ) capsule 1,500 mg (has no administration in time range)  cyclobenzaprine  (FLEXERIL ) tablet 5 mg (has no administration in time range)  senna-docusate (Senokot-S) tablet 1 tablet (has no administration in time range)  polyethylene glycol (MIRALAX  / GLYCOLAX ) packet 17 g (has no administration in time range)  ketorolac  (TORADOL ) 15 MG/ML injection 15 mg (has no administration in time range)  naloxone  (NARCAN ) injection 0.4 mg (has no administration in time range)    And  sodium chloride  flush (NS) 0.9 % injection 9 mL (has no administration in time range)  HYDROmorphone  (DILAUDID ) 1 mg/mL PCA injection (30 mg Intravenous Set-up / Initial Syringe 01/11/24 1331)  ketorolac  (TORADOL ) 15 MG/ML injection 15 mg (15 mg Intravenous Given 01/11/24 0826)  lactated ringers  bolus 500 mL (0 mLs Intravenous Stopped 01/11/24 1001)  HYDROmorphone  (DILAUDID ) injection 1 mg (1 mg Intravenous Given 01/11/24 0827)  HYDROmorphone  (DILAUDID ) injection 1 mg (1 mg Intravenous Given 01/11/24 0912)  HYDROmorphone  (DILAUDID ) injection 1 mg (1 mg Intravenous Given 01/11/24 1478)    ED Course/ Medical  Decision Making/ A&P                                 Medical Decision Making Amount and/or Complexity of Data Reviewed Labs: ordered. Radiology: ordered.  Risk Prescription drug management. Decision regarding hospitalization.   This patient presents to the ED for concern of bilateral knee pain, this involves an extensive number of treatment options, and is a complaint that carries with it a high risk of complications and morbidity.  The differential diagnosis includes sickle cell pain crisis, bony infarction, septic arthritis, gout, inflammatory arthritis, chronic pain, opiate withdrawal   Co morbidities that complicate the patient evaluation  sickle cell anemia, asthma, left hip avascular necrosis, chronic opiate use   Additional history obtained:  Additional history obtained from N/A External records from outside source obtained and reviewed including EMR   Lab Tests:  I Ordered, and personally interpreted labs.  The pertinent results include: Slight anemia and leukocytosis.  Normal ESR, normal kidney function, normal electrolytes   Imaging Studies ordered:  I ordered imaging studies including x-ray of bilateral knees I independently visualized and interpreted imaging which showed increased sclerosis of distal right femur, distal left femur, and proximal left tibia consistent with bony infarction I agree with the radiologist interpretation   Cardiac Monitoring: / EKG:  The patient was maintained on a cardiac monitor.  I personally viewed and interpreted the cardiac monitored which showed an underlying rhythm of: Sinus rhythm   Problem List / ED Course / Critical interventions / Medication management  Patient presenting for worsening bilateral knee pain.  This has been ongoing for the past 3 days.  He does have a history of sickle cell anemia and feels that this is consistent with prior pain crises.  He was seen in the ED yesterday and states that his pain improved to  2/10 after IV Dilaudid .  Since his return home, he has been using over-the-counter pain medications only.  His pain has worsened.  On arrival in the ED, patient appears quite uncomfortable.  Vital signs notable for hypertension.  He has no swelling or erythema to his knee areas.  Multimodal pain control was initiated.  Given his severe discomfort, will repeat lab work and obtain x-ray imaging of knees.  X-ray imaging shows evidence of increased sclerosis consistent with bony infarction.  After 3 doses of Dilaudid , patient continues to have 5/10 severity pain.  I spoke with sickle cell team who will admit for further management. I ordered medication including IV fluids for hydration; Dilaudid  and Toradol  for analgesia Reevaluation of the patient after these medicines showed that the patient improved I have reviewed the patients home medicines and have made adjustments as needed  Social Determinants of Health:  Lives independently        Final Clinical Impression(s) / ED Diagnoses Final diagnoses:  Sickle cell anemia with pain [facility])    Rx / DC Orders ED Discharge Orders     None         Iva Mariner, MD 01/11/24 1631

## 2024-01-11 NOTE — Plan of Care (Signed)

## 2024-01-11 NOTE — ED Triage Notes (Signed)
 Pt had pain in bilateral knees starting Friday. Took OTC ibuprofen  and last of his oxy Saturday. Tried to wait for sickle clinic to open. Appears to be in distress. Has not had crisis in a year.

## 2024-01-12 ENCOUNTER — Other Ambulatory Visit: Payer: Self-pay | Admitting: Nurse Practitioner

## 2024-01-12 DIAGNOSIS — D5700 Hb-SS disease with crisis, unspecified: Secondary | ICD-10-CM

## 2024-01-12 DIAGNOSIS — Z79891 Long term (current) use of opiate analgesic: Secondary | ICD-10-CM

## 2024-01-12 DIAGNOSIS — D571 Sickle-cell disease without crisis: Secondary | ICD-10-CM

## 2024-01-12 LAB — BLOOD CULTURE ID PANEL (REFLEXED) - BCID2

## 2024-01-12 LAB — CBC
HCT: 31.9 % — ABNORMAL LOW (ref 39.0–52.0)
Hemoglobin: 10.9 g/dL — ABNORMAL LOW (ref 13.0–17.0)
MCH: 34.2 pg — ABNORMAL HIGH (ref 26.0–34.0)
MCHC: 34.2 g/dL (ref 30.0–36.0)
MCV: 100 fL (ref 80.0–100.0)
Platelets: 279 10*3/uL (ref 150–400)
RBC: 3.19 MIL/uL — ABNORMAL LOW (ref 4.22–5.81)
RDW: 15.5 % (ref 11.5–15.5)
WBC: 11.9 10*3/uL — ABNORMAL HIGH (ref 4.0–10.5)
nRBC: 1.8 % — ABNORMAL HIGH (ref 0.0–0.2)

## 2024-01-12 MED ORDER — ENOXAPARIN SODIUM 40 MG/0.4ML IJ SOSY
40.0000 mg | PREFILLED_SYRINGE | Freq: Every day | INTRAMUSCULAR | Status: DC
  Administered 2024-01-12: 40 mg via SUBCUTANEOUS
  Filled 2024-01-12: qty 0.4

## 2024-01-12 MED ORDER — IBUPROFEN 800 MG PO TABS: 800.0000 mg | ORAL_TABLET | Freq: Three times a day (TID) | ORAL | 4 refills | Status: DC | PRN

## 2024-01-12 MED ORDER — OXYCODONE HCL 10 MG PO TABS: 10.0000 mg | ORAL_TABLET | Freq: Four times a day (QID) | ORAL | 0 refills | Status: DC | PRN

## 2024-01-12 NOTE — Plan of Care (Signed)
  Problem: Education: Goal: Knowledge of General Education information will improve Description: Including pain rating scale, medication(s)/side effects and non-pharmacologic comfort measures Outcome: Completed/Met   Problem: Health Behavior/Discharge Planning: Goal: Ability to manage health-related needs will improve Outcome: Completed/Met   Problem: Clinical Measurements: Goal: Ability to maintain clinical measurements within normal limits will improve Outcome: Completed/Met Goal: Will remain free from infection Outcome: Completed/Met Goal: Diagnostic test results will improve Outcome: Completed/Met Goal: Respiratory complications will improve Outcome: Completed/Met Goal: Cardiovascular complication will be avoided Outcome: Completed/Met   Problem: Activity: Goal: Risk for activity intolerance will decrease Outcome: Completed/Met   Problem: Nutrition: Goal: Adequate nutrition will be maintained Outcome: Completed/Met   Problem: Coping: Goal: Level of anxiety will decrease Outcome: Completed/Met   Problem: Elimination: Goal: Will not experience complications related to bowel motility Outcome: Completed/Met Goal: Will not experience complications related to urinary retention Outcome: Completed/Met   Problem: Pain Managment: Goal: General experience of comfort will improve and/or be controlled Outcome: Completed/Met   Problem: Safety: Goal: Ability to remain free from injury will improve Outcome: Completed/Met   Problem: Skin Integrity: Goal: Risk for impaired skin integrity will decrease Outcome: Completed/Met   Problem: Education: Goal: Knowledge of vaso-occlusive preventative measures will improve Outcome: Completed/Met Goal: Awareness of infection prevention will improve Outcome: Completed/Met Goal: Awareness of signs and symptoms of anemia will improve Outcome: Completed/Met Goal: Long-term complications will improve Outcome: Completed/Met   Problem:  Self-Care: Goal: Ability to incorporate actions that prevent/reduce pain crisis will improve Outcome: Completed/Met   Problem: Bowel/Gastric: Goal: Gut motility will be maintained Outcome: Completed/Met   Problem: Tissue Perfusion: Goal: Complications related to inadequate tissue perfusion will be avoided or minimized Outcome: Completed/Met   Problem: Respiratory: Goal: Pulmonary complications will be avoided or minimized Outcome: Completed/Met Goal: Acute Chest Syndrome will be identified early to prevent complications Outcome: Completed/Met   Problem: Fluid Volume: Goal: Ability to maintain a balanced intake and output will improve Outcome: Completed/Met   Problem: Sensory: Goal: Pain level will decrease with appropriate interventions Outcome: Completed/Met   Problem: Health Behavior: Goal: Postive changes in compliance with treatment and prescription regimens will improve Outcome: Completed/Met

## 2024-01-12 NOTE — Telephone Encounter (Signed)
 Copied from CRM [id number]. Topic: Clinical - Medication Refill >> Jan 12, 2024 10:50 AM Fonda T wrote: Medication:   Oxycodone  HCl 10 MG TABS   ibuprofen  (ADVIL ) 800 MG tablet  Has the patient contacted their pharmacy? Yes (Agent: If no, request that the patient contact the pharmacy for the refill. If patient does not wish to contact the pharmacy document the reason why and proceed with request.) (Agent: If yes, when and what did the pharmacy advise?)  This is the patient's preferred pharmacy:  [facility] [phone] - [place], [place] - [address] AT [facility] [address] [place] [address] Phone: [phone] Fax: [phone]  Is this the correct pharmacy for this prescription? Yes If no, delete pharmacy and type the correct one.   Has the prescription been filled recently? Yes  Is the patient out of the medication? Yes  Has the patient been seen for an appointment in the last year OR does the patient have an upcoming appointment? Yes  Can we respond through MyChart? No  Agent: Please be advised that Rx refills may take up to 3 business days. We ask that you follow-up with your pharmacy.

## 2024-01-12 NOTE — Progress Notes (Signed)
 PHARMACY - PHYSICIAN COMMUNICATION CRITICAL VALUE ALERT - BLOOD CULTURE IDENTIFICATION (BCID)  Jonathan [name] is an 23 y.o. male who presented to [facility] on 01/11/2024 with a chief complaint of sickle cell pain crisis.  Assessment:  1/4 BCx bottles growing methicillin-resistant Staph. epidermidis (suspect contamination).  Name of physician (or Provider) Contacted: Jeg[place]  Current antibiotics: none  Changes to prescribed antibiotics recommended: none Provider in agreement   Results for orders placed or performed during the hospital encounter of 01/11/24  Blood Culture ID Panel (Reflexed) (Collected: 01/11/2024  9:58 AM)  Result Value Ref Range   Enterococcus faecalis NOT DETECTED NOT DETECTED   Enterococcus Faecium NOT DETECTED NOT DETECTED   Listeria monocytogenes NOT DETECTED NOT DETECTED   Staphylococcus species DETECTED (A) NOT DETECTED   Staphylococcus aureus (BCID) NOT DETECTED NOT DETECTED   Staphylococcus epidermidis DETECTED (A) NOT DETECTED   Staphylococcus lugdunensis NOT DETECTED NOT DETECTED   Streptococcus species NOT DETECTED NOT DETECTED   Streptococcus agalactiae NOT DETECTED NOT DETECTED   Streptococcus pneumoniae NOT DETECTED NOT DETECTED   Streptococcus pyogenes NOT DETECTED NOT DETECTED   A.calcoaceticus-baumannii NOT DETECTED NOT DETECTED   Bacteroides fragilis NOT DETECTED NOT DETECTED   Enterobacterales NOT DETECTED NOT DETECTED   Enterobacter cloacae complex NOT DETECTED NOT DETECTED   Escherichia coli NOT DETECTED NOT DETECTED   Klebsiella aerogenes NOT DETECTED NOT DETECTED   Klebsiella oxytoca NOT DETECTED NOT DETECTED   Klebsiella pneumoniae NOT DETECTED NOT DETECTED   Proteus species NOT DETECTED NOT DETECTED   Salmonella species NOT DETECTED NOT DETECTED   Serratia marcescens NOT DETECTED NOT DETECTED   Haemophilus influenzae NOT DETECTED NOT DETECTED   Neisseria meningitidis NOT DETECTED NOT DETECTED   Pseudomonas aeruginosa NOT DETECTED  NOT DETECTED   Stenotrophomonas maltophilia NOT DETECTED NOT DETECTED   Candida albicans NOT DETECTED NOT DETECTED   Candida auris NOT DETECTED NOT DETECTED   Candida glabrata NOT DETECTED NOT DETECTED   Candida krusei NOT DETECTED NOT DETECTED   Candida parapsilosis NOT DETECTED NOT DETECTED   Candida tropicalis NOT DETECTED NOT DETECTED   Cryptococcus neoformans/gattii NOT DETECTED NOT DETECTED   Methicillin resistance mecA/C DETECTED (A) NOT DETECTED    Diane Mochizuki A 01/12/2024  7:20 AM

## 2024-01-12 NOTE — Progress Notes (Signed)
 Reviewed PDMP substance reporting system prior to prescribing opiate medications. No inconsistencies noted.   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  Dispense: 60 tablet; Refill: 0 - ibuprofen  (ADVIL ) 800 MG tablet; Take 1 tablet (800 mg total) by mouth every 8 (eight) hours as needed.  Dispense: 30 tablet; Refill: 4  2. Chronic prescription opiate use  - Oxycodone  HCl 10 MG TABS; Take 1 tablet (10 mg total) by mouth every 6 (six) hours as needed.  Dispense: 60 tablet; Refill: 0

## 2024-01-12 NOTE — Discharge Summary (Signed)
 Physician Discharge Summary  Santosh Petter Burkard WUJ:[id number] DOB: 2000-12-30 DOA: 01/11/2024  PCP: Paseda, Folashade R, FNP  Admit date: 01/11/2024  Discharge date: 01/11/2024  Time spent: 30 minutes  Discharge Diagnoses:  Principal Problem:   Sickle-cell disease with pain (HCC) Active Problems:   Sickle cell pain crisis (HCC)   Anemia of chronic disease   Chronic pain   Leukocytosis   Discharge Condition: Stable  Diet recommendation: Regular  History of present illness:  adaire Axton  is a 23 y.o. male, with a medical history significant for sickle cell disease that presented to the emergency department for complaints of bilateral lower extremity pain and back pain that started this morning. The patient presented to the emergency department when his pain did not respond well to ibuprofen  and oxycodone . He rates his pain as 9/10 Denies any fever, chills, cough. Nausea or vomiting. No sick contacts or recent travels.     Patient was treated in the emergency department with IV fluid and pain medication with no significant improvement to pain . Patient admitted to in patient for ongoing sickle pain management.  BP (!) 166/108 (BP Location: Left Arm)  Pulse 75  Temp 98.2 F (36.8 C) (Oral)  Resp 18  Ht 5\' 5"  (1.651 m)  Wt 68 kg  SpO2 100%  BMI 24.96 kg/m    Hospital Course:  Tytus Strahle Laday was admitted to the  hospital with sickle cell painful crisis. Patient was treated with IV fluid, weight based IV Dilaudid  PCA, IV Toradol , clinician assisted doses as deemed appropriate, and other adjunct therapies per sickle cell pain management protocol. Choua showed significant improvement symptomatically, pain improved from 9/10 to 5/10 at the time of discharge. Patient was able to ambulate without assistance and tolerating PO well  Patient was discharged home in a hemodynamically stable condition. Kiko will follow-up at the clinic as previously scheduled, continue with home  medications as per prior to admission.  Discharge Instructions We discussed the need for good hydration, monitoring of hydration status, avoidance of heat, cold, stress, and infection triggers. We discussed the need to be compliant with taking other home medications. Omaree was reminded of the need to seek medical attention immediately if any symptom of bleeding, anemia, or infection occurs.  Discharge Exam: Vitals:   01/12/24 1024 01/12/24 1039  BP: 132/77   Pulse: 79   Resp: 18 20  Temp: 98.5 F (36.9 C)   SpO2: 99% 94%    General appearance: alert, cooperative and no distress Eyes: conjunctivae/corneas clear. PERRL, EOM's intact. Fundi benign. Neck: no adenopathy, no carotid bruit, no JVD, supple, symmetrical, trachea midline and thyroid not enlarged, symmetric, no tenderness/mass/nodules Back: symmetric, no curvature. ROM normal. No CVA tenderness. Resp: clear to auscultation bilaterally Chest wall: no tenderness Cardio: regular rate and rhythm, S1, S2 normal, no murmur, click, rub or gallop GI: soft, non-tender; bowel sounds normal; no masses,  no organomegaly Extremities: extremities normal, atraumatic, no cyanosis or edema Pulses: 2+ and symmetric Skin: Skin color, texture, turgor normal. No rashes or lesions Neurologic: Grossly normal  Discharge Instructions     Call MD for:  severe uncontrolled pain   Complete by: As directed    Call MD for:  temperature >100.4   Complete by: As directed    Diet - low sodium heart healthy   Complete by: As directed    Increase activity slowly   Complete by: As directed       Allergies as of 01/12/2024  Reactions   Allergy Medication [diphenhydramine ] Swelling, Other (See Comments)   Face swells        Medication List     TAKE these medications    acetaminophen  500 MG tablet Commonly known as: TYLENOL  Take 500-1,000 mg by mouth every 6 (six) hours as needed (for pain).   Advil  200 MG Caps Generic drug:  Ibuprofen  Take 400-800 mg by mouth every 6 (six) hours as needed (for pain).   cyclobenzaprine  5 MG tablet Commonly known as: FLEXERIL  Take 1 tablet (5 mg total) by mouth 3 (three) times daily as needed for muscle spasms.   hydroxyurea  500 MG capsule Commonly known as: HYDREA  Take 3 capsules (1,500 mg total) by mouth daily. May take with food to minimize GI side effects.   naloxone  4 MG/0.1ML Liqd nasal spray kit Commonly known as: NARCAN  Use 1 nasal spray as a single dose in one nostril; may repeat with a new nasal spray every 2 to 3 minutes in alternating nostrils if needed for oopoidoverdose Dispense: 1 each, Refills: 1 ordered   Vitamin D  (Ergocalciferol ) 1.25 MG (50000 UNIT) Caps capsule Commonly known as: DRISDOL  Take 1 capsule (50,000 Units total) by mouth every 7 (seven) days. What changed: when to take this       Allergies  Allergen Reactions   Allergy Medication [Diphenhydramine ] Swelling and Other (See Comments)    Face swells     Significant Diagnostic Studies: DG Knee Complete 4 Views Left Result Date: 01/11/2024 CLINICAL DATA:  23 year old gentleman with history of sickle cell presents with the BILATERAL knee pain EXAM: LEFT KNEE - COMPLETE 4+ VIEW; RIGHT KNEE - COMPLETE 4+ VIEW COMPARISON:  None available FINDINGS: RIGHT knee: No fracture or dislocation. Soft tissues are normal. Areas of curvilinear sclerosis in the distal femur are likely sites of bony infarction. LEFT knee: No fracture or dislocation. Sclerosis of the distal femur and proximal tibia are likely sites of bony infarction. Soft tissues are normal. IMPRESSION: 1. No fracture or dislocation of the knees. 2. 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 Electronically Signed   By: Elester Grim M.D.   On: 01/11/2024 08:43   DG Knee Complete 4 Views Right Result Date: 01/11/2024 CLINICAL DATA:  23 year old gentleman with history of  sickle cell presents with the BILATERAL knee pain EXAM: LEFT KNEE - COMPLETE 4+ VIEW; RIGHT KNEE - COMPLETE 4+ VIEW COMPARISON:  None available FINDINGS: RIGHT knee: No fracture or dislocation. Soft tissues are normal. Areas of curvilinear sclerosis in the distal femur are likely sites of bony infarction. LEFT knee: No fracture or dislocation. Sclerosis of the distal femur and proximal tibia are likely sites of bony infarction. Soft tissues are normal. IMPRESSION: 1. No fracture or dislocation of the knees. 2. 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 Electronically Signed   By: Elester Grim M.D.   On: 01/11/2024 08:43    Signed:  Lorel Roes NP   01/12/2024, 4:54 PM

## 2024-01-12 NOTE — Telephone Encounter (Signed)
 Copied from CRM [id number]. Topic: Clinical - Medication Refill >> Jan 12, 2024 11:05 AM Georgeann Kindred wrote: Medication:  oxyCODONE  (Oxy IR/ROXICODONE ) immediate release tablet 10 mg Oxycodone  HCl 10 MG TABS ibuprofen  (ADVIL ) 800 MG tablet  Has the patient contacted their pharmacy? Yes (Agent: If no, request that the patient contact the pharmacy for the refill. If patient does not wish to contact the pharmacy document the reason why and proceed with request.) (Agent: If yes, when and what did the pharmacy advise?)  This is the patient's preferred pharmacy:  [facility] [phone] - [place], [place] - [address] AT [facility] [address]  [place] [address] Phone: [phone] Fax: [phone]  Is this the correct pharmacy for this prescription? Yes If no, delete pharmacy and type the correct one.   Has the prescription been filled recently? No  Is the patient out of the medication? Yes  Has the patient been seen for an appointment in the last year OR does the patient have an upcoming appointment? Yes  Can we respond through MyChart? Yes  Agent: Please be advised that Rx refills may take up to 3 business days. We ask that you follow-up with your pharmacy.

## 2024-01-13 ENCOUNTER — Other Ambulatory Visit (HOSPITAL_COMMUNITY): Payer: Self-pay

## 2024-01-13 ENCOUNTER — Telehealth (HOSPITAL_COMMUNITY): Payer: Self-pay

## 2024-01-13 ENCOUNTER — Other Ambulatory Visit: Payer: Self-pay | Admitting: Nurse Practitioner

## 2024-01-13 DIAGNOSIS — Z79891 Long term (current) use of opiate analgesic: Secondary | ICD-10-CM

## 2024-01-13 DIAGNOSIS — D571 Sickle-cell disease without crisis: Secondary | ICD-10-CM

## 2024-01-13 MED ORDER — OXYCODONE HCL 10 MG PO TABS
10.0000 mg | ORAL_TABLET | Freq: Four times a day (QID) | ORAL | 0 refills | Status: DC | PRN
  Filled 2024-01-13: qty 60, 15d supply, fill #0

## 2024-01-13 NOTE — Telephone Encounter (Signed)
 Pt called the day hospital wanting to come in today for sickle cell pain treatment. Pt reports 7/10 pain to right knee. Pt was discharged from the hospital yesterday. Pt states that he took Ibuprofen  at 4 AM. Pt states that he requested his refill yesterday for Oxycodone  and that his pharmacy was out of stock, per pt the pharmacy said that they should have it in stock today. Pt denies fever, chest pain, N/V/D, abdominal pain, and priapism. Per provider pt is unable to come to the day hospital today. Per provider pt should pick up medication, and manage at home. Pt notified and verbalized understanding. This RN advised pt to call the pharmacy when they open about refill that was sent in, and if they are out of stock call the clinic back, so we can see if his PCP can send refill to a pharmacy that has the medication. Pt notified and verbalized understanding.

## 2024-01-13 NOTE — Progress Notes (Signed)
 Patient called to let me know that  his pharmacy at [facility] is out of stock for oxycodone . New RX sent to [facility] , patient notified, I called the pharmacy at [facility] to cancel the RX sent yesterday.  Reviewed PDMP substance reporting system prior to prescribing opiate medications. No inconsistencies noted.    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  Dispense: 60 tablet; Refill: 0  2. Chronic prescription opiate use  - Oxycodone  HCl 10 MG TABS; Take 1 tablet (10 mg total) by mouth every 6 (six) hours as needed.  Dispense: 60 tablet; Refill: 0

## 2024-01-14 LAB — CULTURE, BLOOD (ROUTINE X 2): Special Requests: ADEQUATE

## 2024-01-16 LAB — CULTURE, BLOOD (ROUTINE X 2)
Culture: NO GROWTH
Special Requests: ADEQUATE

## 2024-02-02 ENCOUNTER — Telehealth (HOSPITAL_COMMUNITY): Payer: Self-pay | Admitting: *Deleted

## 2024-02-02 ENCOUNTER — Non-Acute Institutional Stay (HOSPITAL_COMMUNITY)
Admission: AD | Admit: 2024-02-02 | Discharge: 2024-02-02 | Disposition: A | Discharge status: Home / Self Care | Source: Ambulatory Visit | Attending: Internal Medicine | Admitting: Internal Medicine

## 2024-02-02 DIAGNOSIS — J45909 Unspecified asthma, uncomplicated: Secondary | ICD-10-CM | POA: Diagnosis not present

## 2024-02-02 DIAGNOSIS — D5700 Hb-SS disease with crisis, unspecified: Secondary | ICD-10-CM

## 2024-02-02 LAB — CBC WITH DIFFERENTIAL/PLATELET
Abs Immature Granulocytes: 0.02 10*3/uL (ref 0.00–0.07)
Basophils Absolute: 0.1 10*3/uL (ref 0.0–0.1)
Basophils Relative: 1 %
Eosinophils Absolute: 0.2 10*3/uL (ref 0.0–0.5)
Eosinophils Relative: 3 %
HCT: 30.1 % — ABNORMAL LOW (ref 39.0–52.0)
Hemoglobin: 10.1 g/dL — ABNORMAL LOW (ref 13.0–17.0)
Immature Granulocytes: 0 %
Lymphocytes Relative: 39 %
Lymphs Abs: 2.8 10*3/uL (ref 0.7–4.0)
MCH: 31.5 pg (ref 26.0–34.0)
MCHC: 33.6 g/dL (ref 30.0–36.0)
MCV: 93.8 fL (ref 80.0–100.0)
Monocytes Absolute: 0.7 10*3/uL (ref 0.1–1.0)
Monocytes Relative: 10 %
Neutro Abs: 3.3 10*3/uL (ref 1.7–7.7)
Neutrophils Relative %: 47 %
Platelets: 456 10*3/uL — ABNORMAL HIGH (ref 150–400)
RBC: 3.21 MIL/uL — ABNORMAL LOW (ref 4.22–5.81)
RDW: 18.6 % — ABNORMAL HIGH (ref 11.5–15.5)
WBC: 7.1 10*3/uL (ref 4.0–10.5)
nRBC: 0.8 % — ABNORMAL HIGH (ref 0.0–0.2)

## 2024-02-02 LAB — COMPREHENSIVE METABOLIC PANEL WITH GFR
ALT: 12 U/L (ref 0–44)
AST: 25 U/L (ref 15–41)
Albumin: 4 g/dL (ref 3.5–5.0)
Alkaline Phosphatase: 91 U/L (ref 38–126)
Anion gap: 9 (ref 5–15)
BUN: 15 mg/dL (ref 6–20)
CO2: 23 mmol/L (ref 22–32)
Calcium: 8.9 mg/dL (ref 8.9–10.3)
Chloride: 106 mmol/L (ref 98–111)
Creatinine, Ser: 0.88 mg/dL (ref 0.61–1.24)
GFR, Estimated: >60 mL/min (ref >60)
Glucose, Bld: 95 mg/dL (ref 70–99)
Potassium: 3.9 mmol/L (ref 3.5–5.1)
Sodium: 138 mmol/L (ref 135–145)
Total Bilirubin: 1.5 mg/dL — ABNORMAL HIGH (ref 0.0–1.2)
Total Protein: 7.9 g/dL (ref 6.5–8.1)

## 2024-02-02 LAB — RETICULOCYTES
Immature Retic Fract: 34.3 % — ABNORMAL HIGH (ref 2.3–15.9)
RBC.: 3.22 MIL/uL — ABNORMAL LOW (ref 4.22–5.81)
Retic Count, Absolute: 237.3 10*3/uL — ABNORMAL HIGH (ref 19.0–186.0)
Retic Ct Pct: 7.4 % — ABNORMAL HIGH (ref 0.4–3.1)

## 2024-02-02 LAB — LACTATE DEHYDROGENASE: LDH: 217 U/L — ABNORMAL HIGH (ref 98–192)

## 2024-02-02 MED ORDER — SODIUM CHLORIDE 0.9% FLUSH: 9.0000 mL | INTRAVENOUS | Status: DC | PRN

## 2024-02-02 MED ORDER — NALOXONE HCL 0.4 MG/ML IJ SOLN: 0.4000 mg | INTRAMUSCULAR | Status: DC | PRN

## 2024-02-02 MED ORDER — HYDROMORPHONE 1 MG/ML IV SOLN
INTRAVENOUS | Status: DC
  Administered 2024-02-02: 5 mg via INTRAVENOUS
  Administered 2024-02-02: 30 mg via INTRAVENOUS
  Filled 2024-02-02: qty 30

## 2024-02-02 MED ORDER — POLYETHYLENE GLYCOL 3350 17 G PO PACK
17.0000 g | PACK | Freq: Every day | ORAL | Status: DC | PRN
Start: 2024-02-02 — End: 2024-02-02

## 2024-02-02 MED ORDER — SODIUM CHLORIDE 0.45 % IV SOLN: INTRAVENOUS | Status: DC

## 2024-02-02 MED ORDER — DIPHENHYDRAMINE HCL 25 MG PO CAPS: 25.0000 mg | ORAL_CAPSULE | ORAL | Status: DC | PRN

## 2024-02-02 MED ORDER — ACETAMINOPHEN 500 MG PO TABS
1000.0000 mg | ORAL_TABLET | Freq: Once | ORAL | Status: AC
  Administered 2024-02-02: 1000 mg via ORAL
  Filled 2024-02-02: qty 2

## 2024-02-02 MED ORDER — KETOROLAC TROMETHAMINE 15 MG/ML IJ SOLN
15.0000 mg | Freq: Once | INTRAMUSCULAR | Status: AC
  Administered 2024-02-02: 15 mg via INTRAVENOUS
  Filled 2024-02-02: qty 1

## 2024-02-02 MED ORDER — ONDANSETRON HCL 4 MG/2ML IJ SOLN
4.0000 mg | Freq: Four times a day (QID) | INTRAMUSCULAR | Status: DC | PRN
  Administered 2024-02-02: 4 mg via INTRAVENOUS
  Filled 2024-02-02: qty 2

## 2024-02-02 MED ORDER — SENNOSIDES-DOCUSATE SODIUM 8.6-50 MG PO TABS
1.0000 | ORAL_TABLET | Freq: Two times a day (BID) | ORAL | Status: DC
Start: 2024-02-02 — End: 2024-02-02

## 2024-02-02 NOTE — Telephone Encounter (Signed)
 Patient called requesting to come to the day hospital for sickle cell pain. Patient reports left arm pain rated 7/10. Reports taking Ibuprofen  at 6:00 am and Oxycodone  at 5:00 am. Denies fever, chest pain, nausea, vomiting, diarrhea, abdominal pain and priapism. Admits to having transportation without driving himself. Per patient, his roommate will be his transportation. [name], NP notified. Patient can come to the day hospital for pain management. Patient advised and expresses an understanding.

## 2024-02-02 NOTE — H&P (Addendum)
 [facility] History and Physical  [name] ZOX:[id number] DOB: 10/01/2000 DOA: 02/02/2024  PCP: Jacquetta Mattocks, FNP   Chief Complaint:  Chief Complaint  Patient presents with   Sickle Cell Pain Crisis    HPI: Jonathan [name] is a 23 y.o. male with history of sickle cell disease, that presented to the day hospital for complaints of left arm  pain that started this morning. The patient presented to the day hospital when his pain did not respond well to ibuprofen  and oxycodone . He rates his pain as 7/10 Denies any fever, chills, cough. Nausea or vomiting. No sick contacts or recent travels.      Systemic Review: General: The patient denies anorexia, fever, weight loss Cardiac: Denies chest pain, syncope, palpitations, pedal edema  Respiratory: Denies cough, shortness of breath, wheezing GI: Denies severe indigestion/heartburn, abdominal pain, nausea, vomiting, diarrhea and constipation GU: Denies hematuria, incontinence, dysuria  Musculoskeletal: Left arm tenderness Skin: Denies suspicious skin lesions Neurologic: Denies focal weakness or numbness, change in vision  Past Medical History:  Diagnosis Date   Acute chest syndrome(517.3) 08/26/2011   march 2013, Sept 2013   Asthma    Avascular necrosis of bone of left hip (HCC)    Sickle cell disease, type SS (HCC)     Past Surgical History:  Procedure Laterality Date   ADENOIDECTOMY     SPLENECTOMY     TONSILLECTOMY     TONSILLECTOMY AND ADENOIDECTOMY     TOTAL HIP ARTHROPLASTY Left 03/07/2016   UMBILICAL HERNIA REPAIR      Allergies  Allergen Reactions   Allergy Medication [Diphenhydramine ] Swelling and Other (See Comments)    Face swells    Family History  Problem Relation Age of Onset   Hypertension Mother    Hypertension Maternal Grandmother    Hypertension Maternal Grandfather    Asthma Paternal Grandfather    Diabetes Paternal Grandfather       Prior to Admission medications    Medication Sig Start Date End Date Taking? Authorizing Provider  ADVIL  200 MG CAPS Take 400-800 mg by mouth every 6 (six) hours as needed (for pain).   Yes [provider]  hydroxyurea  (HYDREA ) 500 MG capsule Take 3 capsules (1,500 mg total) by mouth daily. May take with food to minimize GI side effects. 11/17/23  Yes Paseda, Folashade R, FNP  ibuprofen  (ADVIL ) 800 MG tablet Take 1 tablet (800 mg total) by mouth every 8 (eight) hours as needed. 01/12/24  Yes Paseda, Folashade R, FNP  Oxycodone  HCl 10 MG TABS Take 1 tablet (10 mg total) by mouth every 6 (six) hours as needed. 01/13/24  Yes Paseda, Folashade R, FNP  Vitamin D , Ergocalciferol , (DRISDOL ) 1.25 MG (50000 UNIT) CAPS capsule Take 1 capsule (50,000 Units total) by mouth every 7 (seven) days. Patient taking differently: Take 50,000 Units by mouth every Sunday. 11/18/23  Yes Paseda, Folashade R, FNP  acetaminophen  (TYLENOL ) 500 MG tablet Take 500-1,000 mg by mouth every 6 (six) hours as needed (for pain).    [provider]  cyclobenzaprine  (FLEXERIL ) 5 MG tablet Take 1 tablet (5 mg total) by mouth 3 (three) times daily as needed for muscle spasms. 07/20/23   Sigurd Driver, FNP  naloxone  (NARCAN ) nasal spray 4 mg/0.1 mL Use 1 nasal spray as a single dose in one nostril; may repeat with a new nasal spray every 2 to 3 minutes in alternating nostrils if needed for oopoidoverdose Dispense: 1 each, Refills: 1 ordered Patient not  taking: Reported on 01/11/2024 11/17/23   Paseda, Folashade R, FNP     Physical Exam: Vitals:   02/02/24 0958 02/02/24 1143 02/02/24 1318 02/02/24 1321  BP:  125/66 122/66   Pulse:  (!) 54 (!) 55   Resp: 17 14 14 14   Temp:      TempSrc:      SpO2:  95%      General: Alert, awake, afebrile, anicteric, not in obvious distress HEENT: Normocephalic and Atraumatic, Mucous membranes pink                PERRLA; EOM intact; No scleral icterus,                 Nares: Patent, Oropharynx: Clear, Fair  Dentition                 Neck: FROM, no cervical lymphadenopathy, thyromegaly, carotid bruit or JVD;  CHEST WALL: No tenderness  CHEST: Normal respiration, clear to auscultation bilaterally  HEART: Regular rate and rhythm; no murmurs rubs or gallops  BACK: No kyphosis or scoliosis; no CVA tenderness  ABDOMEN: Positive Bowel Sounds, soft, non-tender; no masses, no organomegaly EXTREMITIES: No cyanosis, clubbing, or edema SKIN:  no rash or ulceration  CNS: Alert and Oriented x 4, Nonfocal exam, CN 2-12 intact  Labs on Admission:  Basic Metabolic Panel: Recent Labs  Lab 02/02/24 0945  NA 138  K 3.9  CL 106  CO2 23  GLUCOSE 95  BUN 15  CREATININE 0.88  CALCIUM 8.9   Liver Function Tests: Recent Labs  Lab 02/02/24 0945  AST 25  ALT 12  ALKPHOS 91  BILITOT 1.5*  PROT 7.9  ALBUMIN 4.0   No results for input(s): "LIPASE", "AMYLASE" in the last 168 hours. No results for input(s): "AMMONIA" in the last 168 hours. CBC: Recent Labs  Lab 02/02/24 0945  WBC 7.1  NEUTROABS 3.3  HGB 10.1*  HCT 30.1*  MCV 93.8  PLT 456*   Cardiac Enzymes: No results for input(s): "CKTOTAL", "CKMB", "CKMBINDEX", "TROPONINI" in the last 168 hours.  BNP (last 3 results) No results for input(s): "BNP" in the last 8760 hours.  ProBNP (last 3 results) No results for input(s): "PROBNP" in the last 8760 hours.  CBG: No results for input(s): "GLUCAP" in the last 168 hours.   Assessment/Plan Principal Problem:   Sickle cell anemia with pain (HCC)  Admits to the Day Hospital for extended observation IVF 0 .45% Saline @ 125 mls/hour Weight based Dilaudid  PCA started within 30 minutes of admission IV Toradol  15 mg X 1 doses Acetaminophen  1000 mg x 1 dose Labs: CBCD, CMP, Retic Count and LDH Monitor vitals very closely, Re-evaluate pain scale every hour 2 L of Oxygen  by Hendricks Patient will be re-evaluated for pain in the context of function and relationship to baseline as care progresses. If  no significant relieve from pain (remains above 5/10) will transfer patient to inpatient services for further evaluation and management  Code Status: Full  Family Communication: None  DVT Prophylaxis: Ambulate as tolerated   Time spent: 35 Minutes  Lorel Roes NP   If 7PM-7AM, please contact night-coverage [email or web address] 02/02/2024, 2:57 PM

## 2024-02-02 NOTE — Progress Notes (Signed)
 Patient admitted to the day hospital for sickle cell pain. Initially, patient reported left arm pain rated 7/10. For pain management, patient placed on Sickle Cell Dose Dilaudid  PCA, given PO Tylenol , IV Toradol  and hydrated with IV fluids. At discharge, patient rated pain at 2/10. Vital signs stable. Printed AVS offered to patient but patient refused. Patient alert, oriented and ambulatory at discharge.

## 2024-02-02 NOTE — Discharge Summary (Signed)
 Physician Discharge Summary  Jonathan [name] ZOX:[id number] DOB: 2001/08/08 DOA: 02/02/2024  PCP: Paseda, Folashade R, FNP  Admit date: 02/02/2024  Discharge date: 02/02/2024  Time spent: 30 minutes  Discharge Diagnoses:  Principal Problem:   Sickle cell anemia with pain [facility])   Discharge Condition: Stable  Diet recommendation: Regular  History of present illness:  Jonathan [name] is a 23 y.o. male with history of sickle cell disease, that presented to the day hospital for complaints of left arm  pain that started this morning. The patient presented to the day hospital when his pain did not respond well to ibuprofen  and oxycodone . He rates his pain as 7/10 Denies any fever, chills, cough. Nausea or vomiting. No sick contacts or recent travels.   Hospital Course:  Jonathan [name] was admitted to the day hospital with sickle cell painful crisis. Patient was treated with IV fluid, weight based IV Dilaudid  PCA, IV Toradol , clinician assisted doses as deemed appropriate, and other adjunct therapies per sickle cell pain management protocol. Jonathan [name] showed significant improvement symptomatically, pain improved from 7/10 to 2/10 at the time of discharge. Patient was discharged home in a hemodynamically stable condition. Jonathan [name] will follow-up at the clinic as previously scheduled, continue with home medications as per prior to admission.  Discharge Instructions We discussed the need for good hydration, monitoring of hydration status, avoidance of heat, cold, stress, and infection triggers. We discussed the need to be compliant with taking Hydrea  and other home medications. Jonathan [name] was reminded of the need to seek medical attention immediately if any symptom of bleeding, anemia, or infection occurs.  Discharge Exam: Vitals:   02/02/24 1318 02/02/24 1321  BP: 122/66   Pulse: (!) 55   Resp: 14 14  Temp:    SpO2:      General appearance: alert, cooperative and no distress Eyes:  conjunctivae/corneas clear. PERRL, EOM's intact. Fundi benign. Neck: no adenopathy, no carotid bruit, no JVD, supple, symmetrical, trachea midline and thyroid not enlarged, symmetric, no tenderness/mass/nodules Back: symmetric, no curvature. ROM normal. No CVA tenderness. Resp: clear to auscultation bilaterally Chest wall: no tenderness Cardio: regular rate and rhythm, S1, S2 normal, no murmur, click, rub or gallop GI: soft, non-tender; bowel sounds normal; no masses,  no organomegaly Extremities: extremities normal, atraumatic, no cyanosis or edema Pulses: 2+ and symmetric Skin: Skin color, texture, turgor normal. No rashes or lesions Neurologic: Grossly normal  Discharge Instructions     Diet - low sodium heart healthy   Complete by: As directed    Increase activity slowly   Complete by: As directed       Allergies as of 02/02/2024       Reactions   Allergy Medication [diphenhydramine ] Swelling, Other (See Comments)   Face swells        Medication List     TAKE these medications    acetaminophen  500 MG tablet Commonly known as: TYLENOL  Take 500-1,000 mg by mouth every 6 (six) hours as needed (for pain).   Advil  200 MG Caps Generic drug: Ibuprofen  Take 400-800 mg by mouth every 6 (six) hours as needed (for pain).   ibuprofen  800 MG tablet Commonly known as: ADVIL  Take 1 tablet (800 mg total) by mouth every 8 (eight) hours as needed.   cyclobenzaprine  5 MG tablet Commonly known as: FLEXERIL  Take 1 tablet (5 mg total) by mouth 3 (three) times daily as needed for muscle spasms.   hydroxyurea  500 MG capsule Commonly known as: HYDREA  Take 3 capsules (  1,500 mg total) by mouth daily. May take with food to minimize GI side effects.   naloxone  4 MG/0.1ML Liqd nasal spray kit Commonly known as: NARCAN  Use 1 nasal spray as a single dose in one nostril; may repeat with a new nasal spray every 2 to 3 minutes in alternating nostrils if needed for oopoidoverdose Dispense: 1  each, Refills: 1 ordered   Oxycodone  HCl 10 MG Tabs Take 1 tablet (10 mg total) by mouth every 6 (six) hours as needed.   Vitamin D  (Ergocalciferol ) 1.25 MG (50000 UNIT) Caps capsule Commonly known as: DRISDOL  Take 1 capsule (50,000 Units total) by mouth every 7 (seven) days. What changed: when to take this       Allergies  Allergen Reactions   Allergy Medication [Diphenhydramine ] Swelling and Other (See Comments)    Face swells     Significant Diagnostic Studies: DG Knee Complete 4 Views Left Result Date: 01/11/2024 CLINICAL DATA:  23 year old gentleman with history of sickle cell presents with the BILATERAL knee pain EXAM: LEFT KNEE - COMPLETE 4+ VIEW; RIGHT KNEE - COMPLETE 4+ VIEW COMPARISON:  None available FINDINGS: RIGHT knee: No fracture or dislocation. Soft tissues are normal. Areas of curvilinear sclerosis in the distal femur are likely sites of bony infarction. LEFT knee: No fracture or dislocation. Sclerosis of the distal femur and proximal tibia are likely sites of bony infarction. Soft tissues are normal. IMPRESSION: 1. No fracture or dislocation of the knees. 2. 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 Electronically Signed   By: Elester Grim M.D.   On: 01/11/2024 08:43   DG Knee Complete 4 Views Right Result Date: 01/11/2024 CLINICAL DATA:  23 year old gentleman with history of sickle cell presents with the BILATERAL knee pain EXAM: LEFT KNEE - COMPLETE 4+ VIEW; RIGHT KNEE - COMPLETE 4+ VIEW COMPARISON:  None available FINDINGS: RIGHT knee: No fracture or dislocation. Soft tissues are normal. Areas of curvilinear sclerosis in the distal femur are likely sites of bony infarction. LEFT knee: No fracture or dislocation. Sclerosis of the distal femur and proximal tibia are likely sites of bony infarction. Soft tissues are normal. IMPRESSION: 1. No fracture or dislocation of the knees. 2. 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 Electronically Signed   By: Elester Grim M.D.   On: 01/11/2024 08:43    Signed:  Lorel Roes NP   02/02/2024, 3:04 PM

## 2024-02-17 ENCOUNTER — Other Ambulatory Visit (HOSPITAL_COMMUNITY): Payer: Self-pay

## 2024-02-17 ENCOUNTER — Encounter: Payer: Self-pay | Admitting: Nurse Practitioner

## 2024-02-17 ENCOUNTER — Ambulatory Visit (INDEPENDENT_AMBULATORY_CARE_PROVIDER_SITE_OTHER): Payer: Self-pay | Admitting: Nurse Practitioner

## 2024-02-17 VITALS — BP 132/73 | HR 59 | Temp 97.7°F | Wt 147.0 lb

## 2024-02-17 DIAGNOSIS — F1290 Cannabis use, unspecified, uncomplicated: Secondary | ICD-10-CM | POA: Diagnosis not present

## 2024-02-17 DIAGNOSIS — M62838 Other muscle spasm: Secondary | ICD-10-CM | POA: Diagnosis not present

## 2024-02-17 DIAGNOSIS — E559 Vitamin D deficiency, unspecified: Secondary | ICD-10-CM | POA: Diagnosis not present

## 2024-02-17 DIAGNOSIS — D571 Sickle-cell disease without crisis: Secondary | ICD-10-CM

## 2024-02-17 MED ORDER — FOLIC ACID 1 MG PO TABS
1.0000 mg | ORAL_TABLET | Freq: Every day | ORAL | 1 refills | Status: DC
  Filled 2024-02-17 – 2024-03-09 (×2): qty 30, 30d supply, fill #0

## 2024-02-17 MED ORDER — IBUPROFEN 800 MG PO TABS
800.0000 mg | ORAL_TABLET | Freq: Three times a day (TID) | ORAL | 4 refills | Status: DC | PRN
  Filled 2024-02-17: qty 30, 10d supply, fill #0
  Filled 2024-03-09: qty 30, 10d supply, fill #1
  Filled 2024-06-30: qty 30, 10d supply, fill #2

## 2024-02-17 MED ORDER — VITAMIN D (ERGOCALCIFEROL) 1.25 MG (50000 UNIT) PO CAPS
50000.0000 [IU] | ORAL_CAPSULE | ORAL | 1 refills | Status: DC
  Filled 2024-02-17 – 2024-03-09 (×2): qty 4, 28d supply, fill #0

## 2024-02-17 MED ORDER — CYCLOBENZAPRINE HCL 5 MG PO TABS
5.0000 mg | ORAL_TABLET | Freq: Three times a day (TID) | ORAL | 0 refills | Status: AC | PRN
  Filled 2024-02-17: qty 20, 7d supply, fill #0

## 2024-02-17 MED ORDER — CYCLOBENZAPRINE HCL 5 MG PO TABS
5.0000 mg | ORAL_TABLET | Freq: Three times a day (TID) | ORAL | 0 refills | Status: DC | PRN
  Filled 2024-02-17: qty 30, 10d supply, fill #0

## 2024-02-17 NOTE — Assessment & Plan Note (Signed)
 Sometimes has muscle spasm on his back and shoulders, taking Flexeril  as needed helps Flexeril  5 mg 3 times daily as needed refilled Advised to avoid taking medication with oxycodone  to prevent excessive drowsiness

## 2024-02-17 NOTE — Progress Notes (Signed)
 Established Patient Office Visit  Subjective:  Patient ID: Jonathan [name], male    DOB: Jan 02, 2001  Age: 23 y.o. MRN: [id number]  CC: No chief complaint on file.   HPI Jonathan [name] is a 23 y.o. male  has a past medical history of Acute chest syndrome(517.3) (08/26/2011), Asthma, Avascular necrosis of bone of left hip (HCC), Marijuana smoker (02/17/2024), Sickle cell disease, type SS (HCC), and Vitamin D  deficiency (11/17/2023).  Patient presents for follow-up for his chronic medical conditions  Sickle cell disease.  Currently on hydroxyurea  1500 mg daily alternating with 1000 mg every other day.  For chronic pain takes ibuprofen  800 mg every 8 hours as needed mild pain and oxycodone  10 mg every 6 hours as needed.  Oxycodone  was last taken over a week ago.  Stated that he is generally doing well today and does not have any pain.  He denies fever, chills, chest pain, shortness of breath, abdominal pain, nausea, vomiting      Past Medical History:  Diagnosis Date   Acute chest syndrome(517.3) 08/26/2011   march 2013, Sept 2013   Asthma    Avascular necrosis of bone of left hip (HCC)    Marijuana smoker 02/17/2024   Sickle cell disease, type SS (HCC)    Vitamin D  deficiency 11/17/2023    Past Surgical History:  Procedure Laterality Date   ADENOIDECTOMY     SPLENECTOMY     TONSILLECTOMY     TONSILLECTOMY AND ADENOIDECTOMY     TOTAL HIP ARTHROPLASTY Left 03/07/2016   UMBILICAL HERNIA REPAIR      Family History  Problem Relation Age of Onset   Hypertension Mother    Hypertension Maternal Grandmother    Hypertension Maternal Grandfather    Asthma Paternal Grandfather    Diabetes Paternal Grandfather     Social History   Socioeconomic History   Marital status: Single    Spouse name: Not on file   Number of children: Not on file   Years of education: Not on file   Highest education level: Not on file  Occupational History   Not on file  Tobacco Use   Smoking  status: Never   Smokeless tobacco: Never  Vaping Use   Vaping status: Never Used  Substance and Sexual Activity   Alcohol use: No    Alcohol/week: 0.0 standard drinks of alcohol   Drug use: No   Sexual activity: Never  Other Topics Concern   Not on file  Social History Narrative   Lives with a room mate    Social Drivers of Health   Financial Resource Strain: Not on file  Food Insecurity: No Food Insecurity (01/11/2024)   Hunger Vital Sign    Worried About Running Out of Food in the Last Year: Never true    Ran Out of Food in the Last Year: Never true  Transportation Needs: No Transportation Needs (01/11/2024)   PRAPARE - Administrator, Civil Service (Medical): No    Lack of Transportation (Non-Medical): No  Physical Activity: Not on file  Stress: Not on file  Social Connections: Unknown (01/11/2024)   Social Connection and Isolation Panel    Frequency of Communication with Friends and Family: Not on file    Frequency of Social Gatherings with Friends and Family: More than three times a week    Attends Religious Services: 1 to 4 times per year    Active Member of [employer] or Organizations: No    Attends [employer]  or Organization Meetings: 1 to 4 times per year    Marital Status: Patient declined  Intimate Partner Violence: Not At Risk (01/11/2024)   Humiliation, Afraid, Rape, and Kick questionnaire    Fear of Current or Ex-Partner: No    Emotionally Abused: No    Physically Abused: No    Sexually Abused: No    Outpatient Medications Prior to Visit  Medication Sig Dispense Refill   acetaminophen  (TYLENOL ) 500 MG tablet Take 500-1,000 mg by mouth every 6 (six) hours as needed (for pain).     hydroxyurea  (HYDREA ) 500 MG capsule Take 3 capsules (1,500 mg total) by mouth daily. May take with food to minimize GI side effects. 180 capsule 3   Oxycodone  HCl 10 MG TABS Take 1 tablet (10 mg total) by mouth every 6 (six) hours as needed. 60 tablet 0   cyclobenzaprine  (FLEXERIL ) 5  MG tablet Take 1 tablet (5 mg total) by mouth 3 (three) times daily as needed for muscle spasms. 30 tablet 0   ibuprofen  (ADVIL ) 800 MG tablet Take 1 tablet (800 mg total) by mouth every 8 (eight) hours as needed. 30 tablet 4   Vitamin D , Ergocalciferol , (DRISDOL ) 1.25 MG (50000 UNIT) CAPS capsule Take 1 capsule (50,000 Units total) by mouth every 7 (seven) days. 12 capsule 1   naloxone  (NARCAN ) nasal spray 4 mg/0.1 mL Use 1 nasal spray as a single dose in one nostril; may repeat with a new nasal spray every 2 to 3 minutes in alternating nostrils if needed for oopoidoverdose Dispense: 1 each, Refills: 1 ordered (Patient not taking: Reported on 01/11/2024) 1 each 1   ADVIL  200 MG CAPS Take 400-800 mg by mouth every 6 (six) hours as needed (for pain). (Patient not taking: Reported on 02/17/2024)     No facility-administered medications prior to visit.    Allergies  Allergen Reactions   Allergy Medication [Diphenhydramine ] Swelling and Other (See Comments)    Face swells    ROS Review of Systems  Constitutional:  Negative for appetite change, chills, fatigue and fever.  HENT:  Negative for congestion, postnasal drip, rhinorrhea and sneezing.   Respiratory:  Negative for cough, shortness of breath and wheezing.   Cardiovascular:  Negative for chest pain, palpitations and leg swelling.  Gastrointestinal:  Negative for abdominal pain, constipation, nausea and vomiting.  Genitourinary:  Negative for difficulty urinating, dysuria, flank pain and frequency.  Musculoskeletal:  Negative for arthralgias, back pain, joint swelling and myalgias.  Skin:  Negative for color change, pallor, rash and wound.  Neurological:  Negative for dizziness, facial asymmetry, weakness, numbness and headaches.  Psychiatric/Behavioral:  Negative for behavioral problems, confusion, self-injury and suicidal ideas.       Objective:    Physical Exam Vitals and nursing note reviewed.  Constitutional:      General: He is  not in acute distress.    Appearance: Normal appearance. He is not ill-appearing, toxic-appearing or diaphoretic.   Eyes:     General: No scleral icterus.       Right eye: No discharge.        Left eye: No discharge.     Extraocular Movements: Extraocular movements intact.     Conjunctiva/sclera: Conjunctivae normal.    Cardiovascular:     Rate and Rhythm: Normal rate and regular rhythm.     Pulses: Normal pulses.     Heart sounds: Normal heart sounds. No murmur heard.    No friction rub. No gallop.  Pulmonary:  Effort: Pulmonary effort is normal. No respiratory distress.     Breath sounds: Normal breath sounds. No stridor. No wheezing, rhonchi or rales.  Chest:     Chest wall: No tenderness.  Abdominal:     General: There is no distension.     Palpations: Abdomen is soft.     Tenderness: There is no abdominal tenderness. There is no right CVA tenderness, left CVA tenderness or guarding.   Musculoskeletal:        General: No swelling, tenderness, deformity or signs of injury.     Right lower leg: No edema.     Left lower leg: No edema.   Skin:    General: Skin is warm and dry.     Capillary Refill: Capillary refill takes less than 2 seconds.     Coloration: Skin is not jaundiced or pale.     Findings: No bruising, erythema or lesion.   Neurological:     Mental Status: He is alert and oriented to person, place, and time.     Motor: No weakness.     Coordination: Coordination normal.     Gait: Gait normal.   Psychiatric:        Mood and Affect: Mood normal.        Behavior: Behavior normal.        Thought Content: Thought content normal.        Judgment: Judgment normal.     BP 132/73   Pulse (!) 59   Temp 97.7 F (36.5 C)   Wt 147 lb (66.7 kg)   SpO2 100%   BMI 24.46 kg/m  Wt Readings from Last 3 Encounters:  02/17/24 147 lb (66.7 kg)  01/11/24 150 lb (68 kg)  11/17/23 150 lb (68 kg)    No results found for: TSH Lab Results  Component Value Date    WBC 7.1 02/02/2024   HGB 10.1 (L) 02/02/2024   HCT 30.1 (L) 02/02/2024   MCV 93.8 02/02/2024   PLT 456 (H) 02/02/2024   Lab Results  Component Value Date   NA 138 02/02/2024   K 3.9 02/02/2024   CO2 23 02/02/2024   GLUCOSE 95 02/02/2024   BUN 15 02/02/2024   CREATININE 0.88 02/02/2024   BILITOT 1.5 (H) 02/02/2024   ALKPHOS 91 02/02/2024   AST 25 02/02/2024   ALT 12 02/02/2024   PROT 7.9 02/02/2024   ALBUMIN 4.0 02/02/2024   CALCIUM 8.9 02/02/2024   ANIONGAP 9 02/02/2024   EGFR 119 11/17/2023   No results found for: CHOL No results found for: HDL No results found for: [facility] Lab Results  Component Value Date   TRIG 44 10/23/2020   No results found for: CHOLHDL No results found for: YHAJ8R    Assessment & Plan:   Problem List Items Addressed This Visit       Other   Vitamin D  deficiency - Primary   Last vitamin D  Lab Results  Component Value Date   VD25OH 21.2 (L) 11/17/2023  Vitamin D  50,000 units once weekly refilled      Relevant Medications   Vitamin D , Ergocalciferol , (DRISDOL ) 1.25 MG (50000 UNIT) CAPS capsule   Other Relevant Orders   Vitamin D , 25-hydroxy   Hb-SS disease without crisis (HCC)   Sickle cell disease - Continue Hydrea  1500 mg daily alternating with 1000 mg every other day, folic acid  1 mg daily to prevent aplastic bone marrow crises.   We discussed the need for good hydration, monitoring of hydration  status, avoidance of heat, cold, stress, and infection triggers The patient was reminded of the need to seek medical attention of any symptoms of bleeding, anemia, or infection.      Pulmonary evaluation - Patient denies severe recurrent wheezes, shortness of breath with exercise, or persistent cough. If these symptoms develop, pulmonary function tests with spirometry will be ordered, and if abnormal, plan on referral to Pulmonology for further evaluation.    Eye - High risk of proliferative retinopathy. Annual eye exam with  retinal exam recommended to patient.  Referral placed    Acute and chronic painful episodes -  Pt states that she primarily utilizes Ibuprofen  and Tylenol  for mild pain related to sickle cell anemia, continue oxycodone  10 mg every 6 hours as needed for moderate to severe pain we reviewed the terms of our pain agreement, including the need to keep medicines in a safe locked location away from children or pets, and the need to report excess sedation or constipation, measures to avoid constipation, and policies        Relevant Medications   ibuprofen  (ADVIL ) 800 MG tablet   folic acid  (FOLVITE ) 1 MG tablet   Other Relevant Orders   Ambulatory referral to Ophthalmology   ToxAssure Flex 15, Ur   Marijuana smoker   Smokes marijuana socially Cessation encouraged      Muscle spasm   Sometimes has muscle spasm on his back and shoulders, taking Flexeril  as needed helps Flexeril  5 mg 3 times daily as needed refilled Advised to avoid taking medication with oxycodone  to prevent excessive drowsiness      Relevant Medications   cyclobenzaprine  (FLEXERIL ) 5 MG tablet    Meds ordered this encounter  Medications   DISCONTD: cyclobenzaprine  (FLEXERIL ) 5 MG tablet    Sig: Take 1 tablet (5 mg total) by mouth 3 (three) times daily as needed for muscle spasms.    Dispense:  30 tablet    Refill:  0   cyclobenzaprine  (FLEXERIL ) 5 MG tablet    Sig: Take 1 tablet (5 mg total) by mouth 3 (three) times daily as needed for muscle spasms.    Dispense:  20 tablet    Refill:  0   ibuprofen  (ADVIL ) 800 MG tablet    Sig: Take 1 tablet (800 mg total) by mouth every 8 (eight) hours as needed.    Dispense:  30 tablet    Refill:  4   folic acid  (FOLVITE ) 1 MG tablet    Sig: Take 1 tablet (1 mg total) by mouth daily.    Dispense:  90 tablet    Refill:  1   Vitamin D , Ergocalciferol , (DRISDOL ) 1.25 MG (50000 UNIT) CAPS capsule    Sig: Take 1 capsule (50,000 Units total) by mouth every 7 (seven) days.     Dispense:  12 capsule    Refill:  1    Follow-up: Return in about 3 months (around 05/19/2024) for CPE.    Sayward Horvath R Chanteria Haggard, FNP

## 2024-02-17 NOTE — Assessment & Plan Note (Addendum)
 Last vitamin D  Lab Results  Component Value Date   VD25OH 21.2 (L) 11/17/2023  Vitamin D  50,000 units once weekly refilled

## 2024-02-17 NOTE — Assessment & Plan Note (Signed)
 Smokes marijuana socially Cessation encouraged

## 2024-02-17 NOTE — Patient Instructions (Signed)
 1. Vitamin D  deficiency (Primary)  - Vitamin D , 25-hydroxy  2. Hb-SS disease without crisis [facility])  - Ambulatory referral to Ophthalmology - ibuprofen  (ADVIL ) 800 MG tablet; Take 1 tablet (800 mg total) by mouth every 8 (eight) hours as needed.  Dispense: 30 tablet; Refill: 4  3. Muscle spasm  - cyclobenzaprine  (FLEXERIL ) 5 MG tablet; Take 1 tablet (5 mg total) by mouth 3 (three) times daily as needed for muscle spasms.  Dispense: 20 tablet; Refill: 0   It is important that you exercise regularly at least 30 minutes 5 times a week as tolerated  Think about what you will eat, plan ahead. Choose  clean, green, fresh or frozen over canned, processed or packaged foods which are more sugary, salty and fatty. 70 to 75% of food eaten should be vegetables and fruit. Three meals at set times with snacks allowed between meals, but they must be fruit or vegetables. Aim to eat over a 12 hour period , example 7 am to 7 pm, and STOP after  your last meal of the day. Drink water ,generally about 64 ounces per day, no other drink is as healthy. Fruit juice is best enjoyed in a healthy way, by EATING the fruit.  Thanks for choosing [facility] we consider it a privelige to serve you.

## 2024-02-17 NOTE — Assessment & Plan Note (Signed)
 Sickle cell disease - Continue Hydrea  1500 mg daily alternating with 1000 mg every other day, folic acid  1 mg daily to prevent aplastic bone marrow crises.   We discussed the need for good hydration, monitoring of hydration status, avoidance of heat, cold, stress, and infection triggers The patient was reminded of the need to seek medical attention of any symptoms of bleeding, anemia, or infection.      Pulmonary evaluation - Patient denies severe recurrent wheezes, shortness of breath with exercise, or persistent cough. If these symptoms develop, pulmonary function tests with spirometry will be ordered, and if abnormal, plan on referral to Pulmonology for further evaluation.    Eye - High risk of proliferative retinopathy. Annual eye exam with retinal exam recommended to patient.  Referral placed    Acute and chronic painful episodes -  Pt states that she primarily utilizes Ibuprofen  and Tylenol  for mild pain related to sickle cell anemia, continue oxycodone  10 mg every 6 hours as needed for moderate to severe pain we reviewed the terms of our pain agreement, including the need to keep medicines in a safe locked location away from children or pets, and the need to report excess sedation or constipation, measures to avoid constipation, and policies

## 2024-02-18 LAB — VITAMIN D 25 HYDROXY (VIT D DEFICIENCY, FRACTURES): Vit D, 25-Hydroxy: 21 ng/mL — ABNORMAL LOW (ref 30.0–100.0)

## 2024-02-19 ENCOUNTER — Ambulatory Visit: Payer: Self-pay | Admitting: Nurse Practitioner

## 2024-02-21 LAB — TOXASSURE FLEX 15, UR
6-ACETYLMORPHINE IA: NEGATIVE ng/mL
7-aminoclonazepam: NOT DETECTED ng/mg{creat}
AMPHETAMINES IA: NEGATIVE ng/mL
Alpha-hydroxyalprazolam: NOT DETECTED ng/mg{creat}
Alpha-hydroxymidazolam: NOT DETECTED ng/mg{creat}
Alpha-hydroxytriazolam: NOT DETECTED ng/mg{creat}
Alprazolam: NOT DETECTED ng/mg{creat}
BARBITURATES IA: NEGATIVE ng/mL
Buprenorphine: NOT DETECTED ng/mg{creat}
COCAINE METABOLITE IA: NEGATIVE ng/mL
Clonazepam: NOT DETECTED ng/mg{creat}
Creatinine: 102 mg/dL (ref >=20)
Desalkylflurazepam: NOT DETECTED ng/mg{creat}
Desmethyldiazepam: NOT DETECTED ng/mg{creat}
Desmethylflunitrazepam: NOT DETECTED ng/mg{creat}
Diazepam: NOT DETECTED ng/mg{creat}
ETHYL ALCOHOL Enzymatic: NEGATIVE g/dL
Fentanyl: NOT DETECTED ng/mg{creat}
Flunitrazepam: NOT DETECTED ng/mg{creat}
Lorazepam: NOT DETECTED ng/mg{creat}
METHADONE IA: NEGATIVE ng/mL
METHADONE MTB IA: NEGATIVE ng/mL
Midazolam: NOT DETECTED ng/mg{creat}
Norbuprenorphine: NOT DETECTED ng/mg{creat}
Norfentanyl: NOT DETECTED ng/mg{creat}
OPIATE CLASS IA: NEGATIVE ng/mL
OXYCODONE CLASS IA: NEGATIVE ng/mL
Oxazepam: NOT DETECTED ng/mg{creat}
PHENCYCLIDINE IA: NEGATIVE ng/mL
TAPENTADOL, IA: NEGATIVE ng/mL
TRAMADOL IA: NEGATIVE ng/mL
Temazepam: NOT DETECTED ng/mg{creat}

## 2024-02-21 LAB — CANNABINOIDS, MS, UR RFX
Cannabinoids Confirmation: POSITIVE
Carboxy-THC: 224 ng/mg{creat}

## 2024-02-29 ENCOUNTER — Other Ambulatory Visit (HOSPITAL_COMMUNITY): Payer: Self-pay

## 2024-03-01 ENCOUNTER — Other Ambulatory Visit: Payer: Self-pay | Admitting: Nurse Practitioner

## 2024-03-01 DIAGNOSIS — Z79891 Long term (current) use of opiate analgesic: Secondary | ICD-10-CM

## 2024-03-01 DIAGNOSIS — D571 Sickle-cell disease without crisis: Secondary | ICD-10-CM

## 2024-03-01 NOTE — Telephone Encounter (Unsigned)
 Copied from CRM [id number]. Topic: Clinical - Medication Refill >> Mar 01, 2024  8:14 AM Cynthia K wrote: Medication: Oxycodone  HCl 10 MG TABS  Has the patient contacted their pharmacy? Yes (Agent: If no, request that the patient contact the pharmacy for the refill. If patient does not wish to contact the pharmacy document the reason why and proceed with request.) (Agent: If yes, when and what did the pharmacy advise?) Pharmacy needs order to refill  This is the patient's preferred pharmacy:  [place] [facility] [address] [place] [address] Phone: [phone] Fax: [phone]  Is this the correct pharmacy for this prescription? Yes If no, delete pharmacy and type the correct one.   Has the prescription been filled recently? No  Is the patient out of the medication? No  Has the patient been seen for an appointment in the last year OR does the patient have an upcoming appointment? Yes  Can we respond through MyChart? Yes  Agent: Please be advised that Rx refills may take up to 3 business days. We ask that you follow-up with your pharmacy.

## 2024-03-09 ENCOUNTER — Other Ambulatory Visit: Payer: Self-pay | Admitting: Nurse Practitioner

## 2024-03-09 ENCOUNTER — Other Ambulatory Visit (HOSPITAL_COMMUNITY): Payer: Self-pay

## 2024-03-09 ENCOUNTER — Other Ambulatory Visit: Payer: Self-pay

## 2024-03-09 DIAGNOSIS — Z79891 Long term (current) use of opiate analgesic: Secondary | ICD-10-CM

## 2024-03-09 DIAGNOSIS — D571 Sickle-cell disease without crisis: Secondary | ICD-10-CM

## 2024-03-09 MED ORDER — OXYCODONE HCL 10 MG PO TABS
10.0000 mg | ORAL_TABLET | Freq: Four times a day (QID) | ORAL | 0 refills | Status: DC | PRN
  Filled 2024-03-09: qty 60, 15d supply, fill #0

## 2024-03-09 NOTE — Telephone Encounter (Signed)
 Please advise [facility]

## 2024-03-09 NOTE — Progress Notes (Signed)
 Reviewed PDMP substance reporting system prior to prescribing opiate medications. No inconsistencies noted.     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  Dispense: 60 tablet; Refill: 0  2. Chronic prescription opiate use  - Oxycodone  HCl 10 MG TABS; Take 1 tablet (10 mg total) by mouth every 6 (six) hours as needed.  Dispense: 60 tablet; Refill: 0

## 2024-03-10 ENCOUNTER — Other Ambulatory Visit (HOSPITAL_COMMUNITY): Payer: Self-pay

## 2024-03-10 ENCOUNTER — Other Ambulatory Visit: Payer: Self-pay

## 2024-05-17 ENCOUNTER — Non-Acute Institutional Stay (HOSPITAL_COMMUNITY)
Admission: AD | Admit: 2024-05-17 | Discharge: 2024-05-17 | Disposition: A | Discharge status: Home / Self Care | Source: Ambulatory Visit | Attending: Internal Medicine | Admitting: Internal Medicine

## 2024-05-17 ENCOUNTER — Telehealth (HOSPITAL_COMMUNITY): Payer: Self-pay | Admitting: *Deleted

## 2024-05-17 DIAGNOSIS — D5700 Hb-SS disease with crisis, unspecified: Secondary | ICD-10-CM | POA: Diagnosis present

## 2024-05-17 LAB — CBC WITH DIFFERENTIAL/PLATELET
Abs Immature Granulocytes: 0.11 K/uL — ABNORMAL HIGH (ref 0.00–0.07)
Basophils Absolute: 0.1 K/uL (ref 0.0–0.1)
Basophils Relative: 1 %
Eosinophils Absolute: 0.3 K/uL (ref 0.0–0.5)
Eosinophils Relative: 3 %
HCT: 31 % — ABNORMAL LOW (ref 39.0–52.0)
Hemoglobin: 10.4 g/dL — ABNORMAL LOW (ref 13.0–17.0)
Immature Granulocytes: 1 %
Lymphocytes Relative: 27 %
Lymphs Abs: 2.8 K/uL (ref 0.7–4.0)
MCH: 33.1 pg (ref 26.0–34.0)
MCHC: 33.5 g/dL (ref 30.0–36.0)
MCV: 98.7 fL (ref 80.0–100.0)
Monocytes Absolute: 1.1 K/uL — ABNORMAL HIGH (ref 0.1–1.0)
Monocytes Relative: 10 %
Neutro Abs: 6.1 K/uL (ref 1.7–7.7)
Neutrophils Relative %: 58 %
Platelets: 339 K/uL (ref 150–400)
RBC: 3.14 MIL/uL — ABNORMAL LOW (ref 4.22–5.81)
RDW: 18.2 % — ABNORMAL HIGH (ref 11.5–15.5)
WBC: 10.5 K/uL (ref 4.0–10.5)
nRBC: 1.3 % — ABNORMAL HIGH (ref 0.0–0.2)

## 2024-05-17 LAB — COMPREHENSIVE METABOLIC PANEL WITH GFR
ALT: 9 U/L (ref 0–44)
AST: 25 U/L (ref 15–41)
Albumin: 4.2 g/dL (ref 3.5–5.0)
Alkaline Phosphatase: 113 U/L (ref 38–126)
Anion gap: 13 (ref 5–15)
BUN: 10 mg/dL (ref 6–20)
CO2: 22 mmol/L (ref 22–32)
Calcium: 9.1 mg/dL (ref 8.9–10.3)
Chloride: 106 mmol/L (ref 98–111)
Creatinine, Ser: 0.78 mg/dL (ref 0.61–1.24)
GFR, Estimated: >60 mL/min (ref >60)
Glucose, Bld: 98 mg/dL (ref 70–99)
Potassium: 4.6 mmol/L (ref 3.5–5.1)
Sodium: 140 mmol/L (ref 135–145)
Total Bilirubin: 1.6 mg/dL — ABNORMAL HIGH (ref 0.0–1.2)
Total Protein: 6.6 g/dL (ref 6.5–8.1)

## 2024-05-17 LAB — RETICULOCYTES
Immature Retic Fract: 38 % — ABNORMAL HIGH (ref 2.3–15.9)
RBC.: 3.15 MIL/uL — ABNORMAL LOW (ref 4.22–5.81)
Retic Count, Absolute: 288 K/uL — ABNORMAL HIGH (ref 19.0–186.0)
Retic Ct Pct: 9.1 % — ABNORMAL HIGH (ref 0.4–3.1)

## 2024-05-17 LAB — LACTATE DEHYDROGENASE: LDH: 245 U/L — ABNORMAL HIGH (ref 98–192)

## 2024-05-17 MED ORDER — POLYETHYLENE GLYCOL 3350 17 G PO PACK: 17.0000 g | PACK | Freq: Every day | ORAL | Status: DC | PRN

## 2024-05-17 MED ORDER — ONDANSETRON HCL 4 MG/2ML IJ SOLN
4.0000 mg | Freq: Four times a day (QID) | INTRAMUSCULAR | Status: DC | PRN
  Administered 2024-05-17: 4 mg via INTRAVENOUS
  Filled 2024-05-17: qty 2

## 2024-05-17 MED ORDER — NALOXONE HCL 0.4 MG/ML IJ SOLN: 0.4000 mg | INTRAMUSCULAR | Status: DC | PRN

## 2024-05-17 MED ORDER — SODIUM CHLORIDE 0.9% FLUSH: 9.0000 mL | INTRAVENOUS | Status: DC | PRN

## 2024-05-17 MED ORDER — HYDROMORPHONE 1 MG/ML IV SOLN
INTRAVENOUS | Status: DC
  Administered 2024-05-17: 30 mg via INTRAVENOUS
  Filled 2024-05-17: qty 30

## 2024-05-17 MED ORDER — HYDROXYZINE HCL 25 MG PO TABS: 25.0000 mg | ORAL_TABLET | Freq: Three times a day (TID) | ORAL | Status: DC | PRN

## 2024-05-17 MED ORDER — KETOROLAC TROMETHAMINE 15 MG/ML IJ SOLN: 15.0000 mg | Freq: Four times a day (QID) | INTRAMUSCULAR | Status: DC

## 2024-05-17 MED ORDER — SENNOSIDES-DOCUSATE SODIUM 8.6-50 MG PO TABS: 1.0000 | ORAL_TABLET | Freq: Two times a day (BID) | ORAL | Status: DC

## 2024-05-17 MED ORDER — SODIUM CHLORIDE 0.45 % IV SOLN: INTRAVENOUS | Status: DC

## 2024-05-17 MED ORDER — ACETAMINOPHEN 500 MG PO TABS: 1000.0000 mg | ORAL_TABLET | Freq: Once | ORAL | Status: DC

## 2024-05-17 NOTE — Telephone Encounter (Signed)
 Patient called at [phone] to be seen at day hospital for right leg pain 6/10 started yesterday and states he hasn't been seen in ED and he has been out of pain medication since beginning of August.  Denies nausea and vomiting, diarrhea , abdominal pain, fevers or priapism.  No symptoms of flu or covid.  His roommate can bring him in to clinic if accepted.  Per Homer we can see patient at [phone]   I called patient and informed him to come for treatment.

## 2024-05-17 NOTE — Discharge Summary (Addendum)
 Physician Discharge Summary  Jonathan [name] FMW:[id number] DOB: 04/08/2001 DOA: 05/17/2024  PCP: Paseda, Folashade R, FNP  Admit date: 05/17/2024  Discharge date: 05/17/2024  Time spent: 30 minutes  Discharge Diagnoses:  Principal Problem:   Sickle cell anemia with pain [facility])   Discharge Condition: Stable  Diet recommendation: Regular  History of present illness:  Jonathan [name] is a 23 y.o. male with history of sickle cell disease, that presented to the day hospital for complaints of right leg pain that started yesterday.  patient presented to the day hospital when his pain did not respond well to ibuprofen .  He has been out of his home pain medication since beginning of August. He rates his pain as 6/10 Denies any fever, chills, cough. Nausea or vomiting. No sick contacts or recent travels.   Hospital Course:  Jonathan [name] was admitted to the day hospital with sickle cell painful crisis. Patient was treated with IV fluid, weight based IV Dilaudid  PCA, IV Toradol , clinician assisted doses as deemed appropriate, and other adjunct therapies per sickle cell pain management protocol. Jonathan [name] showed significant improvement symptomatically, pain improved from 6/10 to 2/10 at the time of discharge. Patient was discharged home in a hemodynamically stable condition. Jonathan [name] will follow-up at the clinic as previously scheduled, continue with home medications as per prior to admission.  Discharge Instructions We discussed the need for good hydration, monitoring of hydration status, avoidance of heat, cold, stress, and infection triggers. We discussed the need to be compliant with taking home medications. Jonathan [name] was reminded of the need to seek medical attention immediately if any symptom of bleeding, anemia, or infection occurs.  Discharge Exam: Vitals:   05/17/24 1026 05/17/24 1204  BP:  121/69  Pulse:  69  Resp: 16 19  Temp:    SpO2:  95%    General appearance: alert, cooperative  and no distress Eyes: conjunctivae/corneas clear. PERRL, EOM's intact. Fundi benign. Neck: no adenopathy, no carotid bruit, no JVD, supple, symmetrical, trachea midline and thyroid not enlarged, symmetric, no tenderness/mass/nodules Back: symmetric, no curvature. ROM normal. No CVA tenderness. Resp: clear to auscultation bilaterally Chest wall: no tenderness Cardio: regular rate and rhythm, S1, S2 normal, no murmur, click, rub or gallop GI: soft, non-tender; bowel sounds normal; no masses,  no organomegaly Extremities: extremities normal, atraumatic, no cyanosis or edema Pulses: 2+ and symmetric Skin: Skin color, texture, turgor normal. No rashes or lesions Neurologic: Grossly normal  Discharge Instructions     Call MD for:  severe uncontrolled pain   Complete by: As directed    Call MD for:  temperature >100.4   Complete by: As directed    Diet - low sodium heart healthy   Complete by: As directed    Increase activity slowly   Complete by: As directed       Allergies as of 05/17/2024       Reactions   Allergy Medication [diphenhydramine ] Swelling, Other (See Comments)   Face swells        Medication List     TAKE these medications    acetaminophen  500 MG tablet Commonly known as: TYLENOL  Take 500-1,000 mg by mouth every 6 (six) hours as needed (for pain).   cyclobenzaprine  5 MG tablet Commonly known as: FLEXERIL  Take 1 tablet (5 mg total) by mouth 3 (three) times daily as needed for muscle spasms.   folic acid  1 MG tablet Commonly known as: FOLVITE  Take 1 tablet (1 mg total) by mouth daily.  hydroxyurea  500 MG capsule Commonly known as: HYDREA  Take 3 capsules (1,500 mg total) by mouth daily. May take with food to minimize GI side effects.   ibuprofen  800 MG tablet Commonly known as: ADVIL  Take 1 tablet (800 mg total) by mouth every 8 (eight) hours as needed.   naloxone  4 MG/0.1ML Liqd nasal spray kit Commonly known as: NARCAN  Use 1 nasal spray as a  single dose in one nostril; may repeat with a new nasal spray every 2 to 3 minutes in alternating nostrils if needed for oopoidoverdose Dispense: 1 each, Refills: 1 ordered   Oxycodone  HCl 10 MG Tabs Take 1 tablet (10 mg total) by mouth every 6 (six) hours as needed.   Vitamin D  (Ergocalciferol ) 1.25 MG (50000 UNIT) Caps capsule Commonly known as: DRISDOL  Take 1 capsule (50,000 Units total) by mouth every 7 (seven) days.       Allergies  Allergen Reactions   Allergy Medication [Diphenhydramine ] Swelling and Other (See Comments)    Face swells     Significant Diagnostic Studies: No results found.  Signed:  Homer CHRISTELLA Cover NP  05/17/2024, 2:11 PM

## 2024-05-17 NOTE — Progress Notes (Signed)
 Patient ID: Jonathan [name], male   DOB: [birth year], 23 y.o.   MRN: [id number] [facility] History and Physical  Jonathan [name] FMW:[id number] DOB: 11-Mar-2001 DOA: 05/17/2024  PCP: Juanice Thomes SAUNDERS, FNP   Chief Complaint:  Chief Complaint  Patient presents with   Sickle Cell Pain Crisis    HPI: Jonathan [name] is a 23 y.o. male with history of sickle cell disease, that presented to the day hospital for complaints of right leg pain that started yesterday.  patient presented to the day hospital when his pain did not respond well to ibuprofen .  He has been out of his home pain medication since beginning of August.. He rates his pain as 6/10 Denies any fever, chills, cough. Nausea or vomiting. No sick contacts or recent travels.   Systemic Review: General: The patient denies anorexia, fever, weight loss Cardiac: Denies chest pain, syncope, palpitations, pedal edema  Respiratory: Denies cough, shortness of breath, wheezing GI: Denies severe indigestion/heartburn, abdominal pain, nausea, vomiting, diarrhea and constipation GU: Denies hematuria, incontinence, dysuria  Musculoskeletal: Right leg tenderness  skin: Denies suspicious skin lesions Neurologic: Denies focal weakness or numbness, change in vision  Past Medical History:  Diagnosis Date   Acute chest syndrome(517.3) 08/26/2011   march 2013, Sept 2013   Asthma    Avascular necrosis of bone of left hip (HCC)    Marijuana smoker 02/17/2024   Sickle cell disease, type SS (HCC)    Vitamin D  deficiency 11/17/2023    Past Surgical History:  Procedure Laterality Date   ADENOIDECTOMY     SPLENECTOMY     TONSILLECTOMY     TONSILLECTOMY AND ADENOIDECTOMY     TOTAL HIP ARTHROPLASTY Left 03/07/2016   UMBILICAL HERNIA REPAIR      Allergies  Allergen Reactions   Allergy Medication [Diphenhydramine ] Swelling and Other (See Comments)    Face swells    Family History  Problem Relation Age of Onset   Hypertension  Mother    Hypertension Maternal Grandmother    Hypertension Maternal Grandfather    Asthma Paternal Grandfather    Diabetes Paternal Grandfather       Prior to Admission medications   Medication Sig Start Date End Date Taking? Authorizing Provider  acetaminophen  (TYLENOL ) 500 MG tablet Take 500-1,000 mg by mouth every 6 (six) hours as needed (for pain).    [provider]  cyclobenzaprine  (FLEXERIL ) 5 MG tablet Take 1 tablet (5 mg total) by mouth 3 (three) times daily as needed for muscle spasms. 02/17/24   Paseda, Folashade R, FNP  folic acid  (FOLVITE ) 1 MG tablet Take 1 tablet (1 mg total) by mouth daily. 02/17/24   Paseda, Folashade R, FNP  hydroxyurea  (HYDREA ) 500 MG capsule Take 3 capsules (1,500 mg total) by mouth daily. May take with food to minimize GI side effects. 11/17/23   Paseda, Folashade R, FNP  ibuprofen  (ADVIL ) 800 MG tablet Take 1 tablet (800 mg total) by mouth every 8 (eight) hours as needed. 02/17/24   Paseda, Folashade R, FNP  naloxone  (NARCAN ) nasal spray 4 mg/0.1 mL Use 1 nasal spray as a single dose in one nostril; may repeat with a new nasal spray every 2 to 3 minutes in alternating nostrils if needed for oopoidoverdose Dispense: 1 each, Refills: 1 ordered Patient not taking: Reported on 01/11/2024 11/17/23   Paseda, Folashade R, FNP  Oxycodone  HCl 10 MG TABS Take 1 tablet (10 mg total) by mouth every 6 (six) hours as needed. 03/09/24  Paseda, Folashade R, FNP  Vitamin D , Ergocalciferol , (DRISDOL ) 1.25 MG (50000 UNIT) CAPS capsule Take 1 capsule (50,000 Units total) by mouth every 7 (seven) days. 02/17/24   Paseda, Folashade R, FNP     Physical Exam: Vitals:   05/17/24 1000 05/17/24 1026 05/17/24 1204  BP: 132/73  121/69  Pulse:   69  Resp: 16 16 19   Temp: 97.9 F (36.6 C)    TempSrc: Temporal    SpO2: 98%  95%    General: Alert, awake, afebrile, anicteric, not in obvious distress HEENT: Normocephalic and Atraumatic, Mucous membranes pink                 PERRLA; EOM intact; No scleral icterus,                 Nares: Patent, Oropharynx: Clear, Fair Dentition                 Neck: FROM, no cervical lymphadenopathy, thyromegaly, carotid bruit or JVD;  CHEST WALL: No tenderness  CHEST: Normal respiration, clear to auscultation bilaterally  HEART: Regular rate and rhythm; no murmurs rubs or gallops  BACK: No kyphosis or scoliosis; no CVA tenderness  ABDOMEN: Positive Bowel Sounds, soft, non-tender; no masses, no organomegaly EXTREMITIES: No cyanosis, clubbing, or edema SKIN:  no rash or ulceration  CNS: Alert and Oriented x 4, Nonfocal exam, CN 2-12 intact  Labs on Admission:  Basic Metabolic Panel: Recent Labs  Lab 05/17/24 0932  NA 140  K 4.6  CL 106  CO2 22  GLUCOSE 98  BUN 10  CREATININE 0.78  CALCIUM 9.1   Liver Function Tests: Recent Labs  Lab 05/17/24 0932  AST 25  ALT 9  ALKPHOS 113  BILITOT 1.6*  PROT 6.6  ALBUMIN 4.2   No results for input(s): LIPASE, AMYLASE in the last 168 hours. No results for input(s): AMMONIA in the last 168 hours. CBC: Recent Labs  Lab 05/17/24 0932  WBC 10.5  NEUTROABS 6.1  HGB 10.4*  HCT 31.0*  MCV 98.7  PLT 339   Cardiac Enzymes: No results for input(s): CKTOTAL, CKMB, CKMBINDEX, TROPONINI in the last 168 hours.  BNP (last 3 results) No results for input(s): BNP in the last 8760 hours.  ProBNP (last 3 results) No results for input(s): PROBNP in the last 8760 hours.  CBG: No results for input(s): GLUCAP in the last 168 hours.   Assessment/Plan Principal Problem:   Sickle cell anemia with pain (HCC)  Admits to the Day Hospital for extended observation IVF 0.45% Saline @ 125 mls/hour Weight based Dilaudid  PCA started within 30 minutes of admission IV Toradol  15 mg x 1 doses Acetaminophen  1000 mg x 1 dose Labs: CBCD, CMP, Retic Count and LDH Monitor vitals very closely, Re-evaluate pain scale every hour 2 L of Oxygen  by [place] Patient will be  re-evaluated for pain in the context of function and relationship to baseline as care progresses. If no significant relieve from pain (remains above 5/10) will transfer patient to inpatient services for further evaluation and management  Code Status: Full  Family Communication: None  DVT Prophylaxis: Ambulate as tolerated   Time spent: 35 Minutes  Homer CHRISTELLA Cover NP  If 7PM-7AM, please contact night-coverage [email or web address] 05/17/2024, 12:27 PM

## 2024-05-17 NOTE — Progress Notes (Signed)
 Patient admitted to day hospital for sickle cel pain. Patient placed on Dilaudid  PCA pump 0.5/10/3 and hydrated with IV fluids. Pain at admission 6/10 right leg, upon discharge 2  /10 right leg  Vital signs stable, pt ambulatory upon discharge home.  AVS declined.  All personal belongings returned to patent.

## 2024-05-18 ENCOUNTER — Other Ambulatory Visit: Payer: Self-pay

## 2024-05-18 ENCOUNTER — Other Ambulatory Visit (HOSPITAL_COMMUNITY): Payer: Self-pay

## 2024-05-18 ENCOUNTER — Other Ambulatory Visit: Payer: Self-pay | Admitting: Nurse Practitioner

## 2024-05-18 DIAGNOSIS — D571 Sickle-cell disease without crisis: Secondary | ICD-10-CM

## 2024-05-18 DIAGNOSIS — Z79891 Long term (current) use of opiate analgesic: Secondary | ICD-10-CM

## 2024-05-18 MED ORDER — OXYCODONE HCL 10 MG PO TABS
10.0000 mg | ORAL_TABLET | Freq: Four times a day (QID) | ORAL | 0 refills | Status: DC | PRN
  Filled 2024-05-18: qty 60, 15d supply, fill #0

## 2024-05-18 NOTE — Progress Notes (Signed)
 Reviewed PDMP substance reporting system prior to prescribing opiate medications. No inconsistencies noted.     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  Dispense: 60 tablet; Refill: 0  2. Chronic prescription opiate use  - Oxycodone  HCl 10 MG TABS; Take 1 tablet (10 mg total) by mouth every 6 (six) hours as needed.  Dispense: 60 tablet; Refill: 0

## 2024-05-18 NOTE — Telephone Encounter (Signed)
 Please advise [facility]

## 2024-05-20 ENCOUNTER — Encounter: Payer: Self-pay | Admitting: Nurse Practitioner

## 2024-06-25 ENCOUNTER — Inpatient Hospital Stay (HOSPITAL_COMMUNITY)
Admission: EM | Admit: 2024-06-25 | Discharge: 2024-06-30 | DRG: 812 | Disposition: A | Discharge status: Home / Self Care | Attending: Internal Medicine | Admitting: Internal Medicine

## 2024-06-25 ENCOUNTER — Encounter (HOSPITAL_COMMUNITY): Payer: Self-pay | Admitting: Internal Medicine

## 2024-06-25 ENCOUNTER — Other Ambulatory Visit: Payer: Self-pay

## 2024-06-25 DIAGNOSIS — M25551 Pain in right hip: Secondary | ICD-10-CM | POA: Diagnosis present

## 2024-06-25 DIAGNOSIS — Z8249 Family history of ischemic heart disease and other diseases of the circulatory system: Secondary | ICD-10-CM

## 2024-06-25 DIAGNOSIS — D5700 Hb-SS disease with crisis, unspecified: Principal | ICD-10-CM | POA: Diagnosis present

## 2024-06-25 DIAGNOSIS — D638 Anemia in other chronic diseases classified elsewhere: Secondary | ICD-10-CM | POA: Diagnosis present

## 2024-06-25 DIAGNOSIS — Z825 Family history of asthma and other chronic lower respiratory diseases: Secondary | ICD-10-CM

## 2024-06-25 DIAGNOSIS — Z888 Allergy status to other drugs, medicaments and biological substances status: Secondary | ICD-10-CM

## 2024-06-25 DIAGNOSIS — M87052 Idiopathic aseptic necrosis of left femur: Secondary | ICD-10-CM | POA: Diagnosis present

## 2024-06-25 DIAGNOSIS — R1110 Vomiting, unspecified: Secondary | ICD-10-CM | POA: Diagnosis present

## 2024-06-25 DIAGNOSIS — Z833 Family history of diabetes mellitus: Secondary | ICD-10-CM

## 2024-06-25 DIAGNOSIS — Z96642 Presence of left artificial hip joint: Secondary | ICD-10-CM | POA: Diagnosis present

## 2024-06-25 DIAGNOSIS — M879 Osteonecrosis, unspecified: Secondary | ICD-10-CM | POA: Diagnosis present

## 2024-06-25 DIAGNOSIS — Z9081 Acquired absence of spleen: Secondary | ICD-10-CM | POA: Diagnosis not present

## 2024-06-25 DIAGNOSIS — J4489 Other specified chronic obstructive pulmonary disease: Secondary | ICD-10-CM | POA: Diagnosis present

## 2024-06-25 DIAGNOSIS — M25552 Pain in left hip: Secondary | ICD-10-CM | POA: Diagnosis present

## 2024-06-25 DIAGNOSIS — D72829 Elevated white blood cell count, unspecified: Secondary | ICD-10-CM | POA: Diagnosis present

## 2024-06-25 DIAGNOSIS — G894 Chronic pain syndrome: Secondary | ICD-10-CM | POA: Diagnosis present

## 2024-06-25 DIAGNOSIS — I10 Essential (primary) hypertension: Secondary | ICD-10-CM | POA: Diagnosis present

## 2024-06-25 DIAGNOSIS — J449 Chronic obstructive pulmonary disease, unspecified: Secondary | ICD-10-CM | POA: Diagnosis present

## 2024-06-25 LAB — CBC
HCT: 27 % — ABNORMAL LOW (ref 39.0–52.0)
Hemoglobin: 9.3 g/dL — ABNORMAL LOW (ref 13.0–17.0)
MCH: 32 pg (ref 26.0–34.0)
MCHC: 34.4 g/dL (ref 30.0–36.0)
MCV: 92.8 fL (ref 80.0–100.0)
Platelets: 228 K/uL (ref 150–400)
RBC: 2.91 MIL/uL — ABNORMAL LOW (ref 4.22–5.81)
RDW: 18.8 % — ABNORMAL HIGH (ref 11.5–15.5)
WBC: 20.8 K/uL — ABNORMAL HIGH (ref 4.0–10.5)
nRBC: 5.2 % — ABNORMAL HIGH (ref 0.0–0.2)

## 2024-06-25 LAB — CBC WITH DIFFERENTIAL/PLATELET
Abs Immature Granulocytes: 0.72 K/uL — ABNORMAL HIGH (ref 0.00–0.07)
Basophils Absolute: 0.1 K/uL (ref 0.0–0.1)
Basophils Relative: 1 %
Eosinophils Absolute: 0 K/uL (ref 0.0–0.5)
Eosinophils Relative: 0 %
HCT: 28.5 % — ABNORMAL LOW (ref 39.0–52.0)
Hemoglobin: 9.6 g/dL — ABNORMAL LOW (ref 13.0–17.0)
Immature Granulocytes: 5 %
Lymphocytes Relative: 14 %
Lymphs Abs: 2 K/uL (ref 0.7–4.0)
MCH: 31.6 pg (ref 26.0–34.0)
MCHC: 33.7 g/dL (ref 30.0–36.0)
MCV: 93.8 fL (ref 80.0–100.0)
Monocytes Absolute: 0.8 K/uL (ref 0.1–1.0)
Monocytes Relative: 6 %
Neutro Abs: 10.7 K/uL — ABNORMAL HIGH (ref 1.7–7.7)
Neutrophils Relative %: 74 %
Platelets: 318 K/uL (ref 150–400)
RBC: 3.04 MIL/uL — ABNORMAL LOW (ref 4.22–5.81)
RDW: 19.1 % — ABNORMAL HIGH (ref 11.5–15.5)
WBC: 14.5 K/uL — ABNORMAL HIGH (ref 4.0–10.5)
nRBC: 1.2 % — ABNORMAL HIGH (ref 0.0–0.2)

## 2024-06-25 LAB — COMPREHENSIVE METABOLIC PANEL WITH GFR
ALT: 13 U/L (ref 0–44)
AST: 33 U/L (ref 15–41)
Albumin: 4.6 g/dL (ref 3.5–5.0)
Alkaline Phosphatase: 106 U/L (ref 38–126)
Anion gap: 13 (ref 5–15)
BUN: 12 mg/dL (ref 6–20)
CO2: 22 mmol/L (ref 22–32)
Calcium: 9.6 mg/dL (ref 8.9–10.3)
Chloride: 109 mmol/L (ref 98–111)
Creatinine, Ser: 0.83 mg/dL (ref 0.61–1.24)
GFR, Estimated: >60 mL/min (ref >60)
Glucose, Bld: 149 mg/dL — ABNORMAL HIGH (ref 70–99)
Potassium: 4 mmol/L (ref 3.5–5.1)
Sodium: 143 mmol/L (ref 135–145)
Total Bilirubin: 1.6 mg/dL — ABNORMAL HIGH (ref 0.0–1.2)
Total Protein: 7.6 g/dL (ref 6.5–8.1)

## 2024-06-25 LAB — CREATININE, SERUM
Creatinine, Ser: 0.74 mg/dL (ref 0.61–1.24)
GFR, Estimated: >60 mL/min (ref >60)

## 2024-06-25 LAB — RETICULOCYTES
Immature Retic Fract: 40.4 % — ABNORMAL HIGH (ref 2.3–15.9)
RBC.: 3 MIL/uL — ABNORMAL LOW (ref 4.22–5.81)
Retic Count, Absolute: 285.5 K/uL — ABNORMAL HIGH (ref 19.0–186.0)
Retic Ct Pct: 9.5 % — ABNORMAL HIGH (ref 0.4–3.1)

## 2024-06-25 MED ORDER — SENNOSIDES-DOCUSATE SODIUM 8.6-50 MG PO TABS
1.0000 | ORAL_TABLET | Freq: Two times a day (BID) | ORAL | Status: DC
  Administered 2024-06-25 – 2024-06-30 (×10): 1 via ORAL
  Filled 2024-06-25 (×10): qty 1

## 2024-06-25 MED ORDER — DIPHENHYDRAMINE HCL 25 MG PO CAPS
25.0000 mg | ORAL_CAPSULE | ORAL | Status: DC | PRN
Start: 2024-06-25 — End: 2024-06-29

## 2024-06-25 MED ORDER — HYDROMORPHONE HCL 1 MG/ML IJ SOLN
2.0000 mg | Freq: Once | INTRAMUSCULAR | Status: AC
  Administered 2024-06-25: 2 mg via SUBCUTANEOUS
  Filled 2024-06-25: qty 2

## 2024-06-25 MED ORDER — HYDROMORPHONE 1 MG/ML IV SOLN
INTRAVENOUS | Status: DC
  Administered 2024-06-25: 6.5 mg via INTRAVENOUS
  Administered 2024-06-25: 30 mg via INTRAVENOUS
  Administered 2024-06-26: 2.5 mg via INTRAVENOUS
  Administered 2024-06-26: 3 mg via INTRAVENOUS
  Administered 2024-06-26: 4 mg via INTRAVENOUS
  Administered 2024-06-26: 5 mg via INTRAVENOUS
  Administered 2024-06-26: 3 mg via INTRAVENOUS
  Administered 2024-06-26: 1 mg via INTRAVENOUS
  Administered 2024-06-26: 5.5 mg via INTRAVENOUS
  Administered 2024-06-26 – 2024-06-27 (×2): 2.5 mg via INTRAVENOUS
  Administered 2024-06-27: 4 mg via INTRAVENOUS
  Administered 2024-06-27: 3 mg via INTRAVENOUS
  Administered 2024-06-27: 1 mg via INTRAVENOUS
  Administered 2024-06-27: 4 mg via INTRAVENOUS
  Administered 2024-06-27: 2.1 mg via INTRAVENOUS
  Administered 2024-06-28: 4 mg via INTRAVENOUS
  Administered 2024-06-28: 2.5 mg via INTRAVENOUS
  Administered 2024-06-28: 3.5 mg via INTRAVENOUS
  Administered 2024-06-28: 3 mg via INTRAVENOUS
  Administered 2024-06-28: 2.5 mg via INTRAVENOUS
  Administered 2024-06-28 – 2024-06-29 (×2): 3 mg via INTRAVENOUS
  Administered 2024-06-29: 4 mg via INTRAVENOUS
  Administered 2024-06-29: 30 mg via INTRAVENOUS
  Filled 2024-06-25 (×4): qty 30

## 2024-06-25 MED ORDER — HYDROMORPHONE HCL 1 MG/ML IJ SOLN
2.0000 mg | INTRAMUSCULAR | Status: AC
  Administered 2024-06-25: 2 mg via SUBCUTANEOUS
  Filled 2024-06-25: qty 2

## 2024-06-25 MED ORDER — FOLIC ACID 1 MG PO TABS
1.0000 mg | ORAL_TABLET | Freq: Every day | ORAL | Status: DC
  Administered 2024-06-25 – 2024-06-30 (×6): 1 mg via ORAL
  Filled 2024-06-25 (×6): qty 1

## 2024-06-25 MED ORDER — HYDROMORPHONE HCL 1 MG/ML IJ SOLN
2.0000 mg | INTRAMUSCULAR | Status: DC | PRN
  Administered 2024-06-25 – 2024-06-26 (×4): 2 mg via INTRAVENOUS
  Filled 2024-06-25 (×5): qty 2

## 2024-06-25 MED ORDER — ENOXAPARIN SODIUM 40 MG/0.4ML IJ SOSY
40.0000 mg | PREFILLED_SYRINGE | INTRAMUSCULAR | Status: DC
  Administered 2024-06-25 – 2024-06-29 (×5): 40 mg via SUBCUTANEOUS
  Filled 2024-06-25 (×5): qty 0.4

## 2024-06-25 MED ORDER — NALOXONE HCL 0.4 MG/ML IJ SOLN: 0.4000 mg | INTRAMUSCULAR | Status: DC | PRN

## 2024-06-25 MED ORDER — HYDROMORPHONE HCL 1 MG/ML IJ SOLN
1.0000 mg | Freq: Once | INTRAMUSCULAR | Status: AC
  Administered 2024-06-25: 1 mg via INTRAVENOUS
  Filled 2024-06-25: qty 1

## 2024-06-25 MED ORDER — HYDROXYUREA 500 MG PO CAPS
1500.0000 mg | ORAL_CAPSULE | Freq: Every day | ORAL | Status: DC
  Administered 2024-06-25 – 2024-06-30 (×6): 1500 mg via ORAL
  Filled 2024-06-25 (×6): qty 3

## 2024-06-25 MED ORDER — KETOROLAC TROMETHAMINE 15 MG/ML IJ SOLN
15.0000 mg | INTRAMUSCULAR | Status: AC
  Administered 2024-06-25: 15 mg via INTRAVENOUS
  Filled 2024-06-25: qty 1

## 2024-06-25 MED ORDER — SODIUM CHLORIDE 0.45 % IV SOLN
INTRAVENOUS | Status: AC
Start: 2024-06-25 — End: 2024-06-26

## 2024-06-25 MED ORDER — CYCLOBENZAPRINE HCL 5 MG PO TABS
5.0000 mg | ORAL_TABLET | Freq: Three times a day (TID) | ORAL | Status: DC | PRN
Start: 2024-06-25 — End: 2024-06-30
  Administered 2024-06-25 – 2024-06-29 (×4): 5 mg via ORAL
  Filled 2024-06-25 (×4): qty 1

## 2024-06-25 MED ORDER — ONDANSETRON HCL 4 MG/2ML IJ SOLN
4.0000 mg | INTRAMUSCULAR | Status: DC | PRN
  Administered 2024-06-25: 4 mg via INTRAVENOUS
  Filled 2024-06-25 (×2): qty 2

## 2024-06-25 MED ORDER — KETOROLAC TROMETHAMINE 15 MG/ML IJ SOLN
15.0000 mg | Freq: Four times a day (QID) | INTRAMUSCULAR | Status: AC
  Administered 2024-06-25 – 2024-06-30 (×18): 15 mg via INTRAVENOUS
  Filled 2024-06-25 (×19): qty 1

## 2024-06-25 MED ORDER — SODIUM CHLORIDE 0.9% FLUSH
9.0000 mL | INTRAVENOUS | Status: DC | PRN
Start: 2024-06-25 — End: 2024-06-29

## 2024-06-25 MED ORDER — POLYETHYLENE GLYCOL 3350 17 G PO PACK
17.0000 g | PACK | Freq: Every day | ORAL | Status: DC | PRN
  Administered 2024-06-30: 17 g via ORAL

## 2024-06-25 MED ORDER — SODIUM CHLORIDE 0.45 % IV SOLN: INTRAVENOUS | Status: AC

## 2024-06-25 NOTE — ED Triage Notes (Signed)
 Pt reports bilateral hip pain that is similar to his SCC in the past that started yesterday. Unable to identify any triggers. Last took oxy at 11 pm and ibuprofen  at 2 am.

## 2024-06-25 NOTE — Plan of Care (Signed)
  Problem: Nutrition: Goal: Adequate nutrition will be maintained Outcome: Progressing   Problem: Safety: Goal: Ability to remain free from injury will improve Outcome: Progressing   Problem: Education: Goal: Awareness of signs and symptoms of anemia will improve Outcome: Progressing

## 2024-06-25 NOTE — ED Notes (Signed)
 Patient given ginger ale.

## 2024-06-25 NOTE — ED Provider Notes (Signed)
  Physical Exam  BP 138/85   Pulse (!) 109   Temp 98.3 F (36.8 C) (Oral)   Resp 19   SpO2 96%   Physical Exam Vitals and nursing note reviewed.  Constitutional:      Comments: Uncomfortable, but nontoxic-appearing  Cardiovascular:     Pulses:          Dorsalis pedis pulses are 2+ on the right side and 2+ on the left side.       Posterior tibial pulses are 2+ on the right side and 2+ on the left side.  Pulmonary:     Effort: Pulmonary effort is normal. No respiratory distress.  Musculoskeletal:     Right lower leg: No edema.     Left lower leg: No edema.     Comments: Compartments are soft, no swelling noted.  Strength intact.  Skin:    General: Skin is warm and dry.  Neurological:     Motor: No weakness.     Procedures  Procedures  ED Course / MDM    Medical Decision Making Amount and/or Complexity of Data Reviewed Labs: ordered.  Risk Prescription drug management. Decision regarding hospitalization.   Accepted handoff at shift change from [facility]. Please see prior provider note for more detail.   Briefly: Patient is 23 y.o. M presenting to the ER today for evaluation of pain to his bilateral hips, lower back, and lower legs.  Patient's tried his previous meds without relief.  Denies any chest pain, shortness breath, or any fevers.  Feels similar to his previous pain crisis.  Lab work does not indicate crisis.  Patient's had 2 doses of pain medication.  Will need to reevaluate pain after third dose.  Patient has had multiple doses of pain medication, still not having any relief of pain.  Will need to admit to sickle cell management team for pain crisis.  Spoke with Dr. Sim who will admit the patient.       Bernis Ernst, PA-C 06/25/24 1102    Dasie Faden, MD 06/26/24 1017

## 2024-06-25 NOTE — H&P (Signed)
 History and Physical    Patient: Jonathan [name] FMW:[id number] DOB: 2001/04/07 DOA: 06/25/2024 DOS: the patient was seen and examined on 06/25/2024 PCP: Paseda, Folashade R, FNP  Patient coming from: Home  Chief Complaint:  Chief Complaint  Patient presents with   Sickle Cell Pain Crisis   HPI: Jonathan [name] is a 23 y.o. male with medical history significant of sickle cell disease, chronic pain syndrome, asthma, avascular necrosis of the hip, history of hip replacements who presented to the ER with pain in his back and legs consistent with his typical sickle cell crisis.  Patient has not been admitted lately.  He has largely been stable at home.  He is seen and evaluated at this point and found to have acute sickle cell crisis.  No fever or chills.  Patient has been admitted to the hospital for management of acute sickle cell crisis.  Review of Systems: As mentioned in the history of present illness. All other systems reviewed and are negative. Past Medical History:  Diagnosis Date   Acute chest syndrome(517.3) 08/26/2011   march 2013, Sept 2013   Asthma    Avascular necrosis of bone of left hip (HCC)    Marijuana smoker 02/17/2024   Sickle cell disease, type SS (HCC)    Vitamin D  deficiency 11/17/2023   Past Surgical History:  Procedure Laterality Date   ADENOIDECTOMY     SPLENECTOMY     TONSILLECTOMY     TONSILLECTOMY AND ADENOIDECTOMY     TOTAL HIP ARTHROPLASTY Left 03/07/2016   UMBILICAL HERNIA REPAIR     Social History:  reports that he has never smoked. He has never used smokeless tobacco. He reports that he does not drink alcohol and does not use drugs.  Allergies  Allergen Reactions   Allergy Medication [Diphenhydramine ] Swelling and Other (See Comments)    Face swells    Family History  Problem Relation Age of Onset   Hypertension Mother    Hypertension Maternal Grandmother    Hypertension Maternal Grandfather    Asthma Paternal Grandfather    Diabetes  Paternal Grandfather     Prior to Admission medications   Medication Sig Start Date End Date Taking? Authorizing Provider  acetaminophen  (TYLENOL ) 500 MG tablet Take 500-1,000 mg by mouth every 6 (six) hours as needed (for pain).    [provider]  cyclobenzaprine  (FLEXERIL ) 5 MG tablet Take 1 tablet (5 mg total) by mouth 3 (three) times daily as needed for muscle spasms. 02/17/24   Paseda, Folashade R, FNP  folic acid  (FOLVITE ) 1 MG tablet Take 1 tablet (1 mg total) by mouth daily. 02/17/24   Paseda, Folashade R, FNP  hydroxyurea  (HYDREA ) 500 MG capsule Take 3 capsules (1,500 mg total) by mouth daily. May take with food to minimize GI side effects. 11/17/23   Paseda, Folashade R, FNP  ibuprofen  (ADVIL ) 800 MG tablet Take 1 tablet (800 mg total) by mouth every 8 (eight) hours as needed. 02/17/24   Paseda, Folashade R, FNP  naloxone  (NARCAN ) nasal spray 4 mg/0.1 mL Use 1 nasal spray as a single dose in one nostril; may repeat with a new nasal spray every 2 to 3 minutes in alternating nostrils if needed for oopoidoverdose Dispense: 1 each, Refills: 1 ordered Patient not taking: Reported on 01/11/2024 11/17/23   Paseda, Folashade R, FNP  Oxycodone  HCl 10 MG TABS Take 1 tablet (10 mg total) by mouth every 6 (six) hours as needed. 05/18/24   Paseda, Folashade R, FNP  Vitamin D , Ergocalciferol , (DRISDOL ) 1.25 MG (50000 UNIT) CAPS capsule Take 1 capsule (50,000 Units total) by mouth every 7 (seven) days. 02/17/24   Paseda, Folashade R, FNP    Physical Exam: Vitals:   06/25/24 0502 06/25/24 0800  BP: 138/85 (!) 162/102  Pulse: (!) 109 (!) 110  Resp: 19 17  Temp: 98.3 F (36.8 C) 98.5 F (36.9 C)  TempSrc: Oral   SpO2: 96% 94%   Constitutional: NAD, calm, comfortable Eyes: PERRL, lids and conjunctivae normal ENMT: Mucous membranes are moist. Posterior pharynx clear of any exudate or lesions.Normal dentition.  Neck: normal, supple, no masses, no thyromegaly Respiratory: clear to auscultation  bilaterally, no wheezing, no crackles. Normal respiratory effort. No accessory muscle use.  Cardiovascular: Sinus tachycardia, no murmurs / rubs / gallops. No extremity edema. 2+ pedal pulses. No carotid bruits.  Abdomen: no tenderness, no masses palpated. No hepatosplenomegaly. Bowel sounds positive.  Musculoskeletal: Good range of motion, no joint swelling or tenderness, Skin: no rashes, lesions, ulcers. No induration Neurologic: CN 2-12 grossly intact. Sensation intact, DTR normal. Strength 5/5 in all 4.  Psychiatric: Normal judgment and insight. Alert and oriented x 3. Normal mood  Data Reviewed:  Temperature 98.5, blood pressure 163/101, pulse 119 respiratory rate of 20 oxygen  sat 94% on room air.  White count is 20.8 hemoglobin 9.3.  Platelets are 228.  Reticulocyte count percent is 9.5%.  Assessment and Plan:  #1 sickle cell pain crisis: Patient will be admitted for sickle cell pain management.  Initiate Dilaudid  PCA, Toradol , D5 half-normal saline at 125 cc an hour.  #2 chronic pain syndrome: Continue chronic home regimen  #3 anemia of chronic disease: Continue to monitor H&H.  #4 leukocytosis: Secondary to vaso-occlusive crisis.  Continue to monitor    Advance Care Planning:   Code Status: Full Code   Consults: None  Family Communication: No family at bedside  Severity of Illness: The appropriate patient status for this patient is INPATIENT. Inpatient status is judged to be reasonable and necessary in order to provide the required intensity of service to ensure the patient's safety. The patient's presenting symptoms, physical exam findings, and initial radiographic and laboratory data in the context of their chronic comorbidities is felt to place them at high risk for further clinical deterioration. Furthermore, it is not anticipated that the patient will be medically stable for discharge from the hospital within 2 midnights of admission.   * I certify that at the point of  admission it is my clinical judgment that the patient will require inpatient hospital care spanning beyond 2 midnights from the point of admission due to high intensity of service, high risk for further deterioration and high frequency of surveillance required.*  AuthorBETHA SIM KNOLL, MD 06/25/2024 9:13 AM  For on call review [email or web address].

## 2024-06-25 NOTE — ED Provider Notes (Signed)
 [place] EMERGENCY DEPARTMENT AT [facility] Provider Note   CSN: [id number] Arrival date & time: 06/25/24  0456     Patient presents with: Sickle Cell Pain Crisis   Jonathan [name] is a 23 y.o. male.   23 year old male with history of sickle cell anemia with bilateral hip pain, radiates to lower back. History of AVN left hip with prior replacement of the hip. Pain similar to prior crises. Unrelieved with home pain meds. No fevers, no injury.        Prior to Admission medications   Medication Sig Start Date End Date Taking? Authorizing Provider  acetaminophen  (TYLENOL ) 500 MG tablet Take 500-1,000 mg by mouth every 6 (six) hours as needed (for pain).    [provider]  cyclobenzaprine  (FLEXERIL ) 5 MG tablet Take 1 tablet (5 mg total) by mouth 3 (three) times daily as needed for muscle spasms. 02/17/24   Paseda, Folashade R, FNP  folic acid  (FOLVITE ) 1 MG tablet Take 1 tablet (1 mg total) by mouth daily. 02/17/24   Paseda, Folashade R, FNP  hydroxyurea  (HYDREA ) 500 MG capsule Take 3 capsules (1,500 mg total) by mouth daily. May take with food to minimize GI side effects. 11/17/23   Paseda, Folashade R, FNP  ibuprofen  (ADVIL ) 800 MG tablet Take 1 tablet (800 mg total) by mouth every 8 (eight) hours as needed. 02/17/24   Paseda, Folashade R, FNP  naloxone  (NARCAN ) nasal spray 4 mg/0.1 mL Use 1 nasal spray as a single dose in one nostril; may repeat with a new nasal spray every 2 to 3 minutes in alternating nostrils if needed for oopoidoverdose Dispense: 1 each, Refills: 1 ordered Patient not taking: Reported on 01/11/2024 11/17/23   Paseda, Folashade R, FNP  Oxycodone  HCl 10 MG TABS Take 1 tablet (10 mg total) by mouth every 6 (six) hours as needed. 05/18/24   Paseda, Folashade R, FNP  Vitamin D , Ergocalciferol , (DRISDOL ) 1.25 MG (50000 UNIT) CAPS capsule Take 1 capsule (50,000 Units total) by mouth every 7 (seven) days. 02/17/24   Paseda, Folashade R, FNP    Allergies:  Allergy medication [diphenhydramine ]    Review of Systems Negative except as per HPI Updated Vital Signs BP 138/85   Pulse (!) 109   Temp 98.3 F (36.8 C) (Oral)   Resp 19   SpO2 96%   Physical Exam Vitals and nursing note reviewed.  Constitutional:      General: He is not in acute distress.    Appearance: He is well-developed. He is not diaphoretic.  HENT:     Head: Normocephalic and atraumatic.  Cardiovascular:     Pulses: Normal pulses.  Pulmonary:     Effort: Pulmonary effort is normal.  Musculoskeletal:        General: Tenderness present. No swelling.     Right lower leg: No edema.     Left lower leg: No edema.  Skin:    General: Skin is warm and dry.     Findings: No erythema or rash.  Neurological:     Mental Status: He is alert and oriented to person, place, and time.  Psychiatric:        Behavior: Behavior normal.     (all labs ordered are listed, but only abnormal results are displayed) Labs Reviewed  CBC WITH DIFFERENTIAL/PLATELET - Abnormal; Notable for the following components:      Result Value   WBC 14.5 (*)    RBC 3.04 (*)    Hemoglobin  9.6 (*)    HCT 28.5 (*)    RDW 19.1 (*)    nRBC 1.2 (*)    Neutro Abs 10.7 (*)    Abs Immature Granulocytes 0.72 (*)    All other components within normal limits  COMPREHENSIVE METABOLIC PANEL WITH GFR - Abnormal; Notable for the following components:   Glucose, Bld 149 (*)    Total Bilirubin 1.6 (*)    All other components within normal limits  RETICULOCYTES - Abnormal; Notable for the following components:   Retic Ct Pct 9.5 (*)    RBC. 3.00 (*)    Retic Count, Absolute 285.5 (*)    Immature Retic Fract 40.4 (*)    All other components within normal limits    EKG: None  Radiology: No results found.   Procedures   Medications Ordered in the ED  0.45 % sodium chloride  infusion ( Intravenous New Bag/Given 06/25/24 0537)  ondansetron  (ZOFRAN ) injection 4 mg (4 mg Intravenous Given 06/25/24 0544)   ketorolac  (TORADOL ) 15 MG/ML injection 15 mg (15 mg Intravenous Given 06/25/24 0538)  HYDROmorphone  (DILAUDID ) injection 2 mg (2 mg Subcutaneous Given 06/25/24 0542)  HYDROmorphone  (DILAUDID ) injection 2 mg (2 mg Subcutaneous Given 06/25/24 0620)                                    Medical Decision Making Amount and/or Complexity of Data Reviewed Labs: ordered.  Risk Prescription drug management.   This patient presents to the ED for concern of hip pain, this involves an extensive number of treatment options, and is a complaint that carries with it a high risk of complications and morbidity.  The differential diagnosis includes but not limited to sickle cell crisis   Co morbidities / Chronic conditions that complicate the patient evaluation  Sickle cell anemia, avascular necrosis of the left hip with left hip replacement, hypertension, asthma, acute chest   Additional history obtained:  Additional history obtained from EMR External records from outside source obtained and reviewed including prior labs on file   Lab Tests:  I Ordered, and personally interpreted labs.  The pertinent results include: CBC consistent with 14.5, hemoglobin stable, currently 9.6.  CMP without significant findings.  Reticulocyte count reviewed    Problem List / ED Course / Critical interventions / Medication management  23 year old male with complaint of sickle cell crisis, similar to prior with pain in his hips.  Patient is afebrile and vomiting.  Labs with slight leukocytosis compared to prior, hemoglobin at baseline.  Pain improving, plan is for additional pain medication and reassessment.  Care signed out at change of shift to oncoming provider. I ordered medication including dilaudid , toradol , IVF   Reevaluation of the patient after these medicines showed that the patient pain improved from 10/10 to 7/10.  I have reviewed the patients home medicines and have made adjustments as needed   Social  Determinants of Health:  Has PCP   Test / Admission - Considered:  Disposition pending at time of signout from provider      Final diagnoses:  None    ED Discharge Orders     None          Beverley Leita LABOR, PA-C 06/25/24 9340    Jerral Meth, MD 06/25/24 2332

## 2024-06-26 DIAGNOSIS — D5700 Hb-SS disease with crisis, unspecified: Secondary | ICD-10-CM | POA: Diagnosis not present

## 2024-06-26 LAB — CBC WITH DIFFERENTIAL/PLATELET
Abs Immature Granulocytes: 0.28 K/uL — ABNORMAL HIGH (ref 0.00–0.07)
Basophils Absolute: 0.1 K/uL (ref 0.0–0.1)
Basophils Relative: 0 %
Eosinophils Absolute: 0 K/uL (ref 0.0–0.5)
Eosinophils Relative: 0 %
HCT: 26.5 % — ABNORMAL LOW (ref 39.0–52.0)
Hemoglobin: 9.1 g/dL — ABNORMAL LOW (ref 13.0–17.0)
Immature Granulocytes: 2 %
Lymphocytes Relative: 10 %
Lymphs Abs: 1.6 K/uL (ref 0.7–4.0)
MCH: 31.8 pg (ref 26.0–34.0)
MCHC: 34.3 g/dL (ref 30.0–36.0)
MCV: 92.7 fL (ref 80.0–100.0)
Monocytes Absolute: 1.2 K/uL — ABNORMAL HIGH (ref 0.1–1.0)
Monocytes Relative: 8 %
Neutro Abs: 12.4 K/uL — ABNORMAL HIGH (ref 1.7–7.7)
Neutrophils Relative %: 80 %
Platelets: 254 K/uL (ref 150–400)
RBC: 2.86 MIL/uL — ABNORMAL LOW (ref 4.22–5.81)
RDW: 18.9 % — ABNORMAL HIGH (ref 11.5–15.5)
WBC: 15.6 K/uL — ABNORMAL HIGH (ref 4.0–10.5)
nRBC: 14.7 % — ABNORMAL HIGH (ref 0.0–0.2)

## 2024-06-26 LAB — COMPREHENSIVE METABOLIC PANEL WITH GFR
ALT: 16 U/L (ref 0–44)
AST: 101 U/L — ABNORMAL HIGH (ref 15–41)
Albumin: 4.2 g/dL (ref 3.5–5.0)
Alkaline Phosphatase: 122 U/L (ref 38–126)
Anion gap: 10 (ref 5–15)
BUN: 12 mg/dL (ref 6–20)
CO2: 22 mmol/L (ref 22–32)
Calcium: 9.4 mg/dL (ref 8.9–10.3)
Chloride: 103 mmol/L (ref 98–111)
Creatinine, Ser: 0.74 mg/dL (ref 0.61–1.24)
GFR, Estimated: >60 mL/min (ref >60)
Glucose, Bld: 107 mg/dL — ABNORMAL HIGH (ref 70–99)
Potassium: 4.1 mmol/L (ref 3.5–5.1)
Sodium: 136 mmol/L (ref 135–145)
Total Bilirubin: 2.3 mg/dL — ABNORMAL HIGH (ref 0.0–1.2)
Total Protein: 7.3 g/dL (ref 6.5–8.1)

## 2024-06-26 MED ORDER — ACETAMINOPHEN 325 MG PO TABS
650.0000 mg | ORAL_TABLET | Freq: Four times a day (QID) | ORAL | Status: AC | PRN
  Administered 2024-06-26 – 2024-06-29 (×2): 650 mg via ORAL
  Filled 2024-06-26 (×2): qty 2

## 2024-06-26 NOTE — Progress Notes (Signed)
 SICKLE CELL SERVICE PROGRESS NOTE  Jonathan [name] FMW:[id number] DOB: 03-Nov-2000 DOA: 06/25/2024 PCP: Paseda, Folashade R, FNP  Assessment/Plan: Principal Problem:   Sickle cell disease with crisis (HCC) Active Problems:   Hypertension   Avascular necrosis of bone of left hip (HCC)   Chronic obstruct airways disease (HCC)  Sickle cell pain crisis: Patient is still on Dilaudid  PCA, Toradol , IV fluids.  Continue oral medications and supportive care. Anemia of chronic disease: Continue monitor H&H. Chronic pain syndrome: Continue chronic pain management COPD: No acute exacerbation.  Continue to monitor. Sinus tachycardia: Heart rate is high.  Most likely from acute crisis.  Continue to monitor Essential hypertension: Continue to monitor blood pressure. History of avascular necrosis: Stable.  Patient has had previous surgery.   Code Status: Full code Family Communication: No family at bedside Disposition Plan: Home when ready  [facility]  Pager [phone]. If 7PM-7AM, please contact night-coverage.  06/26/2024, 12:32 PM  LOS: 1 day   Brief narrative: Jonathan [name] is a 23 y.o. male with medical history significant of sickle cell disease, chronic pain syndrome, asthma, avascular necrosis of the hip, history of hip replacements who presented to the ER with pain in his back and legs consistent with his typical sickle cell crisis.  Patient has not been admitted lately.  He has largely been stable at home.  He is seen and evaluated at this point and found to have acute sickle cell crisis.  No fever or chills.  Patient has been admitted to the hospital for management of acute sickle cell crisis.   Consultants: None  Procedures: Chest x-ray, CTA  Antibiotics: None  HPI/Subjective: Patient's pain is at 8 out of 10.  He has continued to have sinus tachycardia and pain 8 out of 10.  Objective: Vitals:   06/26/24 0518 06/26/24 0722 06/26/24 1039 06/26/24 1203  BP: (!) 146/94   (!) 150/98   Pulse: (!) 104  (!) 121   Resp:  20 16 (!) 22  Temp: 98.8 F (37.1 C)     TempSrc: Oral     SpO2: 93% 93% 93% 95%  Weight:      Height:       Weight change:   Intake/Output Summary (Last 24 hours) at 06/26/2024 1232 Last data filed at 06/26/2024 0915 Gross per 24 hour  Intake --  Output 2100 ml  Net -2100 ml    General: Alert, awake, oriented x3, in no acute distress.  HEENT: [place]/AT [place]ER[place], EOMI Neck: Trachea midline,  no masses, no thyromegal,y no JVD, no carotid bruit OROPHARYNX:  Moist, No exudate/ erythema/lesions.  Heart: Regular rate and rhythm, without murmurs, rubs, gallops, PMI non-displaced, no heaves or thrills on palpation.  Lungs: Clear to auscultation, no wheezing or rhonchi noted. No increased vocal fremitus resonant to percussion  Abdomen: Soft, nontender, nondistended, positive bowel sounds, no masses no hepatosplenomegaly noted..  Neuro: No focal neurological deficits noted cranial nerves II through XII grossly intact. DTRs 2+ bilaterally upper and lower extremities. Strength 5 out of 5 in bilateral upper and lower extremities. Musculoskeletal: No warm swelling or erythema around joints, no spinal tenderness noted. Psychiatric: Patient alert and oriented x3, good insight and cognition, good recent to remote recall. Lymph node survey: No cervical axillary or inguinal lymphadenopathy noted.   Data Reviewed: Basic Metabolic Panel: Recent Labs  Lab 06/25/24 0517 06/25/24 1335 06/26/24 0703  NA 143  --  136  K 4.0  --  4.1  CL 109  --  103  CO2 22  --  22  GLUCOSE 149*  --  107*  BUN 12  --  12  CREATININE 0.83 0.74 0.74  CALCIUM 9.6  --  9.4   Liver Function Tests: Recent Labs  Lab 06/25/24 0517 06/26/24 0703  AST 33 101*  ALT 13 16  ALKPHOS 106 122  BILITOT 1.6* 2.3*  PROT 7.6 7.3  ALBUMIN 4.6 4.2   No results for input(s): LIPASE, AMYLASE in the last 168 hours. No results for input(s): AMMONIA in the last 168  hours. CBC: Recent Labs  Lab 06/25/24 0517 06/25/24 1335 06/26/24 0703  WBC 14.5* 20.8* 15.6*  NEUTROABS 10.7*  --  12.4*  HGB 9.6* 9.3* 9.1*  HCT 28.5* 27.0* 26.5*  MCV 93.8 92.8 92.7  PLT 318 228 254   Cardiac Enzymes: No results for input(s): CKTOTAL, CKMB, CKMBINDEX, TROPONINI in the last 168 hours. BNP (last 3 results) No results for input(s): BNP in the last 8760 hours.  ProBNP (last 3 results) No results for input(s): PROBNP in the last 8760 hours.  CBG: No results for input(s): GLUCAP in the last 168 hours.  No results found for this or any previous visit (from the past 240 hours).   Studies: No results found.  Scheduled Meds:  enoxaparin  (LOVENOX ) injection  40 mg Subcutaneous Q24H   folic acid   1 mg Oral Daily   HYDROmorphone    Intravenous Q4H   hydroxyurea   1,500 mg Oral Daily   ketorolac   15 mg Intravenous Q6H   senna-docusate  1 tablet Oral BID   Continuous Infusions:  sodium chloride  125 mL/hr at 06/26/24 0247    Principal Problem:   Sickle cell disease with crisis [facility]) Active Problems:   Hypertension   Avascular necrosis of bone of left hip (HCC)   Chronic obstruct airways disease (HCC)

## 2024-06-26 NOTE — TOC Initial Note (Signed)
 Transition of Care [facility]) - Initial/Assessment Note    Patient Details  Name: Jonathan [name] MRN: [id number] Date of Birth: [birth year]  [facility]) CM/SW Contact:    Sonda Manuella Quill, RN Phone Number: 06/26/2024, 5:48 PM  Clinical Narrative:                 [name] w/ pt in room; pt said he shares an apt w/ roommate; he plans to return at d/c; he identified POC mother Metta Goldberg [phone]); family will provide transportation; pt verified insurance/PCP; he denied SDOH risks; pt said he does not have DME, HH services, or home oxygen ; IP CM will follow.  Expected Discharge Plan: Home/Self Care Barriers to Discharge: Continued Medical Work up   Patient Goals and CMS Choice Patient states their goals for this hospitalization and ongoing recovery are:: home          Expected Discharge Plan and Services   Discharge Planning Services: CM Consult   Living arrangements for the past 2 months: Apartment                 DME Arranged: N/A DME Agency: NA       HH Arranged: NA HH Agency: NA        Prior Living Arrangements/Services Living arrangements for the past 2 months: Apartment Lives with:: Roommate Patient language and need for interpreter reviewed:: Yes Do you feel safe going back to the place where you live?: Yes      Need for Family Participation in Patient Care: Yes (Comment) Care giver support system in place?: Yes (comment) Current home services:  (n/a) Criminal Activity/Legal Involvement Pertinent to Current Situation/Hospitalization: No - Comment as needed  Activities of Daily Living   ADL Screening (condition at time of admission) Independently performs ADLs?: Yes (appropriate for developmental age) Is the patient deaf or have difficulty hearing?: No Does the patient have difficulty seeing, even when wearing glasses/contacts?: No Does the patient have difficulty concentrating, remembering, or making decisions?: No  Permission  Sought/Granted Permission sought to share information with : Case Manager Permission granted to share information with : Yes, Verbal Permission Granted  Share Information with NAME: Case Manager     Permission granted to share info w Relationship: Metta Goldberg (mother) [phone]     Emotional Assessment Appearance:: Appears stated age Attitude/Demeanor/Rapport: Gracious Affect (typically observed): Accepting Orientation: : Oriented to Self, Oriented to Place, Oriented to  Time, Oriented to Situation Alcohol / Substance Use: Not Applicable Psych Involvement: No (comment)  Admission diagnosis:  Sickle cell disease with crisis (HCC) [D57.00] Sickle cell pain crisis (HCC) [D57.00] Patient Active Problem List   Diagnosis Date Noted   Marijuana smoker 02/17/2024   Muscle spasm 02/17/2024   Sickle-cell disease with pain (HCC) 01/11/2024   Vitamin D  deficiency 11/17/2023   Chronic prescription opiate use 11/17/2023   Hb-SS disease without crisis (HCC) 11/17/2023   History of sickle cell disease 07/18/2023   Bilirubinemia 07/18/2023   Elevated blood pressure reading 07/18/2023   Leukocytosis 07/18/2023   Sickle cell anemia with pain (HCC) 04/13/2023   Elevated AST (SGOT) 08/19/2022   Elevated random blood glucose level 08/19/2022   Chronic pain 06/11/2022   Community acquired pneumonia 02/12/2022   Pulmonary nodule 02/12/2022   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HCC) 09/02/2021   Sickle cell crisis (HCC) 07/27/2021   COVID-19 virus infection 07/27/2021   Anemia of chronic disease 06/27/2021   Thrombocytosis 06/27/2021   Mild intermittent asthma without complication 11/11/2020  Sickle cell disease with crisis (HCC) 10/23/2020   Sickle cell pain crisis (HCC) 08/16/2020   Proteinuria 04/14/2020   Influenza 10/13/2017   Sickle cell disease (HCC) 04/27/2017   Failed hearing screening 03/25/2016   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   Myopic astigmatism 03/16/2015    Avascular necrosis of bone of left hip (HCC) 03/24/2013   Chronic obstruct airways disease (HCC) 03/15/2013   Snoring 04/08/2012   Hypertension 11/11/2011   Reactive airway disease 11/09/2011   Sickle cell crisis acute chest syndrome (HCC) 07/30/2011   PCP:  Paseda, Folashade R, FNP Pharmacy:   [facility] [address] [place] [address] Phone: [phone] Fax: [phone]     Social Drivers of Health (SDOH) Social History: SDOH Screenings   Food Insecurity: No Food Insecurity (06/26/2024)  Housing: Low Risk  (06/26/2024)  Transportation Needs: No Transportation Needs (06/26/2024)  Utilities: Not At Risk (06/26/2024)  Depression (PHQ2-9): Low Risk  (02/17/2024)  Social Connections: Unknown (01/11/2024)  Tobacco Use: Low Risk  (06/25/2024)   SDOH Interventions: Food Insecurity Interventions: Intervention Not Indicated, Inpatient TOC Housing Interventions: Intervention Not Indicated, Inpatient TOC Transportation Interventions: Intervention Not Indicated, Inpatient TOC Utilities Interventions: Intervention Not Indicated, Inpatient TOC   Readmission Risk Interventions    12/07/2021    5:19 PM 10/15/2021   10:30 AM  Readmission Risk Prevention Plan  Transportation Screening Complete Complete  PCP or Specialist Appt within 5-7 Days Complete Complete  Home Care Screening Complete Complete  Medication Review (RN CM)  Complete

## 2024-06-26 NOTE — Plan of Care (Signed)
  Problem: Clinical Measurements: Goal: Diagnostic test results will improve Outcome: Progressing   Problem: Coping: Goal: Level of anxiety will decrease Outcome: Progressing   Problem: Safety: Goal: Ability to remain free from injury will improve Outcome: Progressing   

## 2024-06-26 NOTE — Plan of Care (Signed)
 Problem: Pain Managment: Goal: General experience of comfort will improve and/or be controlled Outcome: Not Progressing  Current regimen with Dilaudid  PCA not effective in relieving patient's pain, minimal relief with PRN flexeril  and dilaudid  injection.   Problem: Elimination: Goal: Will not experience complications related to bowel motility Outcome: Progressing Goal: Will not experience complications related to urinary retention Outcome: Progressing   Problem: Safety: Goal: Ability to remain free from injury will improve Outcome: Progressing   Problem: Skin Integrity: Goal: Risk for impaired skin integrity will decrease Outcome: Progressing

## 2024-06-27 NOTE — Progress Notes (Signed)
 Patient ID: Jonathan [name], male   DOB: [birth year], 23 y.o.   MRN: [id number] Subjective: Jonathan [name] is a 23 y.o. male with medical history significant of sickle cell disease, chronic pain syndrome, asthma, avascular necrosis of the hip, history of hip replacements who presented to the ER with pain in his back and legs consistent with his typical sickle cell crisis.   Patient is endorsing pain of 7/10 this morning.  He has no new complaints.  Denies nausea, vomiting, diarrhea, headache, cough.  No urinary symptoms.  Objective:  Vital signs in last 24 hours:  Vitals:   06/27/24 0500 06/27/24 0750 06/27/24 1000 06/27/24 1412  BP:   (!) 151/101 135/87  Pulse:   (!) 123 (!) 135  Resp: 20 20 16 18   Temp:   98.5 F (36.9 C) 100.1 F (37.8 C)  TempSrc:    Oral  SpO2: 92% 90% 96% 94%  Weight:      Height:        Intake/Output from previous day:   Intake/Output Summary (Last 24 hours) at 06/27/2024 1412 Last data filed at 06/27/2024 0600 Gross per 24 hour  Intake 67 ml  Output 1900 ml  Net -1833 ml    Physical Exam: General: Alert, awake, oriented x3, in no acute distress.  HEENT: [place]/AT [place]ER[place], EOMI Neck: Trachea midline,  no masses, no thyromegal,y no JVD, no carotid bruit OROPHARYNX:  Moist, No exudate/ erythema/lesions.  Heart: Regular rate and rhythm, without murmurs, rubs, gallops, PMI non-displaced, no heaves or thrills on palpation.  Lungs: Clear to auscultation, no wheezing or rhonchi noted. No increased vocal fremitus resonant to percussion  Abdomen: Soft, nontender, nondistended, positive bowel sounds, no masses no hepatosplenomegaly noted..  Neuro: No focal neurological deficits noted cranial nerves II through XII grossly intact. DTRs 2+ bilaterally upper and lower extremities. Strength 5 out of 5 in bilateral upper and lower extremities. Musculoskeletal: Generalized body tenderness.   Psychiatric: Patient alert and oriented x3, good insight and cognition, good  recent to remote recall. Lymph node survey: No cervical axillary or inguinal lymphadenopathy noted.  Lab Results:  Basic Metabolic Panel:    Component Value Date/Time   NA 136 06/26/2024 0703   NA 141 11/17/2023 1207   K 4.1 06/26/2024 0703   CL 103 06/26/2024 0703   CO2 22 06/26/2024 0703   BUN 12 06/26/2024 0703   BUN 8 11/17/2023 1207   CREATININE 0.74 06/26/2024 0703   CREATININE 0.70 05/12/2016 1052   GLUCOSE 107 (H) 06/26/2024 0703   CALCIUM 9.4 06/26/2024 0703   CBC:    Component Value Date/Time   WBC 15.6 (H) 06/26/2024 0703   HGB 9.1 (L) 06/26/2024 0703   HGB 11.1 (L) 11/17/2023 1207   HCT 26.5 (L) 06/26/2024 0703   HCT 33.6 (L) 11/17/2023 1207   PLT 254 06/26/2024 0703   PLT 352 11/17/2023 1207   MCV 92.7 06/26/2024 0703   MCV 107 (H) 11/17/2023 1207   NEUTROABS 12.4 (H) 06/26/2024 0703   NEUTROABS 3.8 11/17/2023 1207   LYMPHSABS 1.6 06/26/2024 0703   LYMPHSABS 2.4 11/17/2023 1207   MONOABS 1.2 (H) 06/26/2024 0703   EOSABS 0.0 06/26/2024 0703   EOSABS 0.2 11/17/2023 1207   BASOSABS 0.1 06/26/2024 0703   BASOSABS 0.1 11/17/2023 1207    No results found for this or any previous visit (from the past 240 hours).  Studies/Results: No results found.  Medications: Scheduled Meds:  enoxaparin  (LOVENOX ) injection  40 mg Subcutaneous Q24H  folic acid   1 mg Oral Daily   HYDROmorphone    Intravenous Q4H   hydroxyurea   1,500 mg Oral Daily   ketorolac   15 mg Intravenous Q6H   senna-docusate  1 tablet Oral BID   Continuous Infusions: PRN Meds:.acetaminophen , cyclobenzaprine , diphenhydrAMINE , HYDROmorphone  (DILAUDID ) injection, naloxone  **AND** sodium chloride  flush, ondansetron , polyethylene glycol  Consultants: None  Procedures: None  Antibiotics: None  Assessment/Plan: Principal Problem:   Sickle cell disease with crisis (HCC) Active Problems:   Hypertension   Avascular necrosis of bone of left hip (HCC)   Chronic obstruct airways disease  (HCC)   Hb Sickle Cell Disease with Pain crisis: Continue IVF 0.45% Saline @KVO . Continue weight based Dilaudid  PCA, IV Toradol  15 mg Q 6 H for a total of 5 days, continue oral home pain medications as ordered. Monitor vitals very closely, Re-evaluate pain scale regularly, 2 L of Oxygen  by [place]. Patient encouraged to ambulate on the hallway today.  Leukocytosis:  WBC elevated.  Patient has no acute signs and symptoms of infection. Will continue to monitor without antibiotics. Anemia of Chronic Disease: Hemoglobin within patient's baseline.  No clinical indication for transfusion at this time.  Will continue to monitor daily CBC. Chronic pain Syndrome: Continue oral home pain medication.   Code Status: Full Code Family Communication: N/A Disposition Plan: Not yet ready for discharge  Homer CHRISTELLA Cover NP  If 7PM-7AM, please contact night-coverage.  06/27/2024, 2:12 PM  LOS: 2 days

## 2024-06-28 LAB — COMPREHENSIVE METABOLIC PANEL WITH GFR
ALT: 17 U/L (ref 0–44)
AST: 63 U/L — ABNORMAL HIGH (ref 15–41)
Albumin: 3.7 g/dL (ref 3.5–5.0)
Alkaline Phosphatase: 222 U/L — ABNORMAL HIGH (ref 38–126)
Anion gap: 8 (ref 5–15)
BUN: 15 mg/dL (ref 6–20)
CO2: 29 mmol/L (ref 22–32)
Calcium: 9.3 mg/dL (ref 8.9–10.3)
Chloride: 102 mmol/L (ref 98–111)
Creatinine, Ser: 0.82 mg/dL (ref 0.61–1.24)
GFR, Estimated: >60 mL/min (ref >60)
Glucose, Bld: 116 mg/dL — ABNORMAL HIGH (ref 70–99)
Potassium: 3.9 mmol/L (ref 3.5–5.1)
Sodium: 138 mmol/L (ref 135–145)
Total Bilirubin: 2.6 mg/dL — ABNORMAL HIGH (ref 0.0–1.2)
Total Protein: 6.9 g/dL (ref 6.5–8.1)

## 2024-06-28 LAB — CBC
HCT: 23.9 % — ABNORMAL LOW (ref 39.0–52.0)
Hemoglobin: 8 g/dL — ABNORMAL LOW (ref 13.0–17.0)
MCH: 30.7 pg (ref 26.0–34.0)
MCHC: 33.5 g/dL (ref 30.0–36.0)
MCV: 91.6 fL (ref 80.0–100.0)
Platelets: 188 K/uL (ref 150–400)
RBC: 2.61 MIL/uL — ABNORMAL LOW (ref 4.22–5.81)
RDW: 17.1 % — ABNORMAL HIGH (ref 11.5–15.5)
WBC: 11.2 K/uL — ABNORMAL HIGH (ref 4.0–10.5)
nRBC: 7 % — ABNORMAL HIGH (ref 0.0–0.2)

## 2024-06-28 MED ORDER — OXYCODONE HCL 5 MG PO TABS
10.0000 mg | ORAL_TABLET | Freq: Four times a day (QID) | ORAL | Status: DC | PRN
  Administered 2024-06-29 – 2024-06-30 (×5): 10 mg via ORAL
  Filled 2024-06-28 (×5): qty 2

## 2024-06-28 NOTE — Progress Notes (Signed)
 Patient ID: Jonathan [name], male   DOB: Jan 23, 2001, 23 y.o.   MRN: [id number] Subjective: Jonathan [name] is a 23 y.o. male with medical history significant of sickle cell disease, chronic pain syndrome, asthma, avascular necrosis of the hip, history of hip replacements who presented to the ER with pain in his back and legs consistent with his typical sickle cell crisis.   Patient is endorsing pain of 6-7/10 this morning.  He has no new complaints.  Denies nausea, vomiting, diarrhea, headache, cough.  No urinary symptoms.  Objective:  Vital signs in last 24 hours:  Vitals:   06/28/24 0930 06/28/24 1200 06/28/24 1210 06/28/24 1542  BP: (!) 139/94  (!) 125/90   Pulse: (!) 125  (!) 103   Resp:  19 20 19   Temp: 99.1 F (37.3 C)  99 F (37.2 C)   TempSrc:      SpO2:  93%  92%  Weight:      Height:        Intake/Output from previous day:   Intake/Output Summary (Last 24 hours) at 06/28/2024 1610 Last data filed at 06/27/2024 1754 Gross per 24 hour  Intake --  Output 600 ml  Net -600 ml    Physical Exam: General: Alert, awake, oriented x3, in no acute distress.  HEENT: [place]/AT PEERL, EOMI Neck: Trachea midline,  no masses, no thyromegal,y no JVD, no carotid bruit OROPHARYNX:  Moist, No exudate/ erythema/lesions.  Heart: Regular rate and rhythm, without murmurs, rubs, gallops, PMI non-displaced, no heaves or thrills on palpation.  Lungs: Clear to auscultation, no wheezing or rhonchi noted. No increased vocal fremitus resonant to percussion  Abdomen: Soft, nontender, nondistended, positive bowel sounds, no masses no hepatosplenomegaly noted..  Neuro: No focal neurological deficits noted cranial nerves II through XII grossly intact. DTRs 2+ bilaterally upper and lower extremities. Strength 5 out of 5 in bilateral upper and lower extremities. Musculoskeletal: Generalized body tenderness.   Psychiatric: Patient alert and oriented x3, good insight and cognition, good recent to remote  recall. Lymph node survey: No cervical axillary or inguinal lymphadenopathy noted.  Lab Results:  Basic Metabolic Panel:    Component Value Date/Time   NA 138 06/28/2024 0628   NA 141 11/17/2023 1207   K 3.9 06/28/2024 0628   CL 102 06/28/2024 0628   CO2 29 06/28/2024 0628   BUN 15 06/28/2024 0628   BUN 8 11/17/2023 1207   CREATININE 0.82 06/28/2024 0628   CREATININE 0.70 05/12/2016 1052   GLUCOSE 116 (H) 06/28/2024 0628   CALCIUM 9.3 06/28/2024 0628   CBC:    Component Value Date/Time   WBC 11.2 (H) 06/28/2024 0628   HGB 8.0 (L) 06/28/2024 0628   HGB 11.1 (L) 11/17/2023 1207   HCT 23.9 (L) 06/28/2024 0628   HCT 33.6 (L) 11/17/2023 1207   PLT 188 06/28/2024 0628   PLT 352 11/17/2023 1207   MCV 91.6 06/28/2024 0628   MCV 107 (H) 11/17/2023 1207   NEUTROABS 12.4 (H) 06/26/2024 0703   NEUTROABS 3.8 11/17/2023 1207   LYMPHSABS 1.6 06/26/2024 0703   LYMPHSABS 2.4 11/17/2023 1207   MONOABS 1.2 (H) 06/26/2024 0703   EOSABS 0.0 06/26/2024 0703   EOSABS 0.2 11/17/2023 1207   BASOSABS 0.1 06/26/2024 0703   BASOSABS 0.1 11/17/2023 1207    No results found for this or any previous visit (from the past 240 hours).  Studies/Results: No results found.  Medications: Scheduled Meds:  enoxaparin  (LOVENOX ) injection  40 mg Subcutaneous Q24H  folic acid   1 mg Oral Daily   HYDROmorphone    Intravenous Q4H   hydroxyurea   1,500 mg Oral Daily   ketorolac   15 mg Intravenous Q6H   senna-docusate  1 tablet Oral BID   Continuous Infusions: PRN Meds:.acetaminophen , cyclobenzaprine , diphenhydrAMINE , naloxone  **AND** sodium chloride  flush, ondansetron , polyethylene glycol  Consultants: None  Procedures: None  Antibiotics: None  Assessment/Plan: Principal Problem:   Sickle cell disease with crisis (HCC) Active Problems:   Hypertension   Avascular necrosis of bone of left hip (HCC)   Chronic obstruct airways disease (HCC)   Hb Sickle Cell Disease with Pain crisis: pain is  gradually improving continue IVF 0.45% Saline @KVO . Continue weight based Dilaudid  PCA, IV Toradol  15 mg Q 6 H for a total of 5 days, continue oral home pain medications as ordered. Monitor vitals very closely, Re-evaluate pain scale regularly, 2 L of Oxygen  by [place]. Patient encouraged to ambulate on the hallway today.  Leukocytosis: WBC elevated.  Patient has no acute signs and symptoms of infection. Will continue to monitor without antibiotics. Anemia of Chronic Disease: Hemoglobin within patient's baseline.  No clinical indication for transfusion at this time.  Will continue to monitor daily CBC. Chronic pain Syndrome: Continue oral home pain medication. Hypertension: Stable. Continue medication as prescribed   Code Status: Full Code Family Communication: N/A Disposition Plan: Not yet ready for discharge  Homer CHRISTELLA Cover NP  If 7PM-7AM, please contact night-coverage.  06/28/2024, 4:10 PM  LOS: 3 days

## 2024-06-28 NOTE — Plan of Care (Signed)
  Problem: Education: Goal: Knowledge of General Education information will improve Description: Including pain rating scale, medication(s)/side effects and non-pharmacologic comfort measures Outcome: Progressing   Problem: Health Behavior/Discharge Planning: Goal: Ability to manage health-related needs will improve Outcome: Progressing   Problem: Nutrition: Goal: Adequate nutrition will be maintained Outcome: Progressing   Problem: Coping: Goal: Level of anxiety will decrease Outcome: Progressing   Problem: Pain Managment: Goal: General experience of comfort will improve and/or be controlled Outcome: Progressing   Problem: Education: Goal: Knowledge of vaso-occlusive preventative measures will improve Outcome: Progressing   Problem: Sensory: Goal: Pain level will decrease with appropriate interventions Outcome: Progressing

## 2024-06-28 NOTE — Plan of Care (Signed)

## 2024-06-29 NOTE — Progress Notes (Addendum)
 Patient ID: Jonathan [name], male   DOB: 2000/12/17, 23 y.o.   MRN: [id number] Subjective: Jonathan [name] is a 23 y.o. male with medical history significant of sickle cell disease, chronic pain syndrome, asthma, avascular necrosis of the hip, history of hip replacements who presented to the ER with pain in his back and legs consistent with his typical sickle cell crisis.   Patient is endorsing pain of 6/10 this morning. He has no new complaints.  Denies nausea, vomiting, diarrhea, headache, cough.  No urinary symptoms.  Objective:  Vital signs in last 24 hours:  Vitals:   06/30/24 1224 06/30/24 1230 06/30/24 1327 06/30/24 1513  BP: 126/72 124/71 119/80 126/72  Pulse: 94 92 92 94  Resp: 18 19 14 18   Temp: 98.1 F (36.7 C) 98.6 F (37 C) 99.3 F (37.4 C) 98.1 F (36.7 C)  TempSrc:  Oral Oral Oral  SpO2:  98% 99% 99%  Weight:      Height:        Intake/Output from previous day:  No intake or output data in the 24 hours ending 07/04/24 1426   Physical Exam: General: Alert, awake, oriented x3, in no acute distress.  HEENT: [place]/AT PEERL, EOMI Neck: Trachea midline,  no masses, no thyromegal,y no JVD, no carotid bruit OROPHARYNX:  Moist, No exudate/ erythema/lesions.  Heart: Regular rate and rhythm, without murmurs, rubs, gallops, PMI non-displaced, no heaves or thrills on palpation.  Lungs: Clear to auscultation, no wheezing or rhonchi noted. No increased vocal fremitus resonant to percussion  Abdomen: Soft, nontender, nondistended, positive bowel sounds, no masses no hepatosplenomegaly noted..  Neuro: No focal neurological deficits noted cranial nerves II through XII grossly intact. DTRs 2+ bilaterally upper and lower extremities. Strength 5 out of 5 in bilateral upper and lower extremities. Musculoskeletal: Generalized body tenderness.   Psychiatric: Patient alert and oriented x3, good insight and cognition, good recent to remote recall. Lymph node survey: No cervical axillary  or inguinal lymphadenopathy noted.  Lab Results:  Basic Metabolic Panel:    Component Value Date/Time   NA 138 06/28/2024 0628   NA 141 11/17/2023 1207   K 3.9 06/28/2024 0628   CL 102 06/28/2024 0628   CO2 29 06/28/2024 0628   BUN 15 06/28/2024 0628   BUN 8 11/17/2023 1207   CREATININE 0.82 06/28/2024 0628   CREATININE 0.70 05/12/2016 1052   GLUCOSE 116 (H) 06/28/2024 0628   CALCIUM 9.3 06/28/2024 0628   CBC:    Component Value Date/Time   WBC 6.2 06/30/2024 0759   HGB 7.7 (L) 06/30/2024 1545   HGB 11.1 (L) 11/17/2023 1207   HCT 22.5 (L) 06/30/2024 1545   HCT 33.6 (L) 11/17/2023 1207   PLT 216 06/30/2024 0759   PLT 352 11/17/2023 1207   MCV 90.6 06/30/2024 0759   MCV 107 (H) 11/17/2023 1207   NEUTROABS 12.4 (H) 06/26/2024 0703   NEUTROABS 3.8 11/17/2023 1207   LYMPHSABS 1.6 06/26/2024 0703   LYMPHSABS 2.4 11/17/2023 1207   MONOABS 1.2 (H) 06/26/2024 0703   EOSABS 0.0 06/26/2024 0703   EOSABS 0.2 11/17/2023 1207   BASOSABS 0.1 06/26/2024 0703   BASOSABS 0.1 11/17/2023 1207    No results found for this or any previous visit (from the past 240 hours).  Studies/Results: No results found.  Medications: Scheduled Meds:   Continuous Infusions: PRN Meds:.  Consultants: None  Procedures: None  Antibiotics: None  Assessment/Plan: Principal Problem:   Sickle cell disease with crisis (HCC) Active Problems:  Hypertension   Avascular necrosis of bone of left hip (HCC)   Chronic obstruct airways disease (HCC)   Hb Sickle Cell Disease with Pain crisis: pain is gradually improving continue IVF 0.45% Saline @KVO . Discontinue weight based Dilaudid  PCA. IV Toradol  15 mg Q 6 H for a total of 5 days, continue oral home pain medications as ordered. Monitor vitals very closely, Re-evaluate pain scale regularly, 2 L of Oxygen  by [place]. Patient encouraged to ambulate on the hallway today.  Leukocytosis: WBC elevated.  Patient has no acute signs and symptoms of infection.  Will continue to monitor without antibiotics. Anemia of Chronic Disease: Hemoglobin within patient's baseline.  No clinical indication for transfusion at this time.  Will continue to monitor daily CBC. Chronic pain Syndrome: Continue oral home pain medication. Hypertension: Stable. Continue medication as prescribed   Code Status: Full Code Family Communication: N/A Disposition Plan: Not yet ready for discharge  Homer CHRISTELLA Cover NP  If 7PM-7AM, please contact night-coverage.  06/29/2024, 2:26 PM  LOS: 5 days

## 2024-06-29 NOTE — Plan of Care (Signed)

## 2024-06-30 ENCOUNTER — Other Ambulatory Visit (HOSPITAL_COMMUNITY): Payer: Self-pay

## 2024-06-30 ENCOUNTER — Other Ambulatory Visit: Payer: Self-pay

## 2024-06-30 ENCOUNTER — Other Ambulatory Visit: Payer: Self-pay | Admitting: Nurse Practitioner

## 2024-06-30 DIAGNOSIS — Z79891 Long term (current) use of opiate analgesic: Secondary | ICD-10-CM

## 2024-06-30 DIAGNOSIS — D571 Sickle-cell disease without crisis: Secondary | ICD-10-CM

## 2024-06-30 LAB — CBC
HCT: 18.4 % — ABNORMAL LOW (ref 39.0–52.0)
Hemoglobin: 6.2 g/dL — CL (ref 13.0–17.0)
MCH: 30.5 pg (ref 26.0–34.0)
MCHC: 33.7 g/dL (ref 30.0–36.0)
MCV: 90.6 fL (ref 80.0–100.0)
Platelets: 216 K/uL (ref 150–400)
RBC: 2.03 MIL/uL — ABNORMAL LOW (ref 4.22–5.81)
RDW: 16.7 % — ABNORMAL HIGH (ref 11.5–15.5)
WBC: 6.2 K/uL (ref 4.0–10.5)
nRBC: 22 % — ABNORMAL HIGH (ref 0.0–0.2)

## 2024-06-30 LAB — HEMOGLOBIN AND HEMATOCRIT, BLOOD
HCT: 22.5 % — ABNORMAL LOW (ref 39.0–52.0)
Hemoglobin: 7.7 g/dL — ABNORMAL LOW (ref 13.0–17.0)

## 2024-06-30 LAB — PREPARE RBC (CROSSMATCH)

## 2024-06-30 MED ORDER — ACETAMINOPHEN 325 MG PO TABS
650.0000 mg | ORAL_TABLET | Freq: Once | ORAL | Status: AC
  Administered 2024-06-30: 650 mg via ORAL
  Filled 2024-06-30: qty 2

## 2024-06-30 MED ORDER — SODIUM CHLORIDE 0.9% IV SOLUTION: Freq: Once | INTRAVENOUS | Status: AC

## 2024-06-30 MED ORDER — ACETAMINOPHEN 325 MG PO TABS
650.0000 mg | ORAL_TABLET | Freq: Once | ORAL | 0 refills | Status: AC
  Filled 2024-06-30: qty 2, 1d supply, fill #0

## 2024-06-30 MED ORDER — SODIUM CHLORIDE 0.9% IV SOLUTION
10.0000 mL | Freq: Once | INTRAVENOUS | 0 refills | Status: AC
  Filled 2024-06-30: qty 10, 1d supply, fill #0

## 2024-06-30 MED ORDER — OXYCODONE HCL 10 MG PO TABS
10.0000 mg | ORAL_TABLET | Freq: Four times a day (QID) | ORAL | 0 refills | Status: DC | PRN
  Filled 2024-06-30: qty 30, 8d supply, fill #0

## 2024-06-30 MED ORDER — IBUPROFEN 800 MG PO TABS: 800.0000 mg | ORAL_TABLET | Freq: Three times a day (TID) | ORAL | 4 refills | Status: DC | PRN

## 2024-06-30 NOTE — Discharge Instructions (Signed)
 Physician Discharge Summary  Jonathan [name] FMW:[id number] DOB: 2001-03-12 DOA: 06/25/2024  PCP: Paseda, Folashade R, FNP  Admit date: 06/25/2024  Discharge date: 06/30/2024  Discharge Diagnoses:  Principal Problem:   Sickle cell disease with crisis [facility]) Active Problems:   Hypertension   Avascular necrosis of bone of left hip (HCC)   Chronic obstruct airways disease (HCC)   Discharge Condition: Stable  Disposition:  Pt is discharged home in good condition and is to follow up with Paseda, Folashade R, FNP this week to have labs evaluated. Jonathan [name] is instructed to increase activity slowly and balance with rest for the next few days, and use prescribed medication to complete treatment of pain  Diet: Regular Wt Readings from Last 3 Encounters:  06/25/24 67.6 kg  02/17/24 66.7 kg  01/11/24 68 kg    History of present illness:    Hospital Course:  Patient was admitted for sickle cell pain crisis and managed appropriately with IVF, IV Dilaudid  via PCA and IV Toradol , as well as other adjunct therapies per sickle cell pain management protocols.  Patient was therefore discharged home today in a hemodynamically stable condition.   Jonathan [name] will follow-up with PCP within 1 week of this discharge. Jonathan [name] was counseled extensively about nonpharmacologic means of pain management, patient verbalized understanding and was appreciative of  the care received during this admission.   We discussed the need for good hydration, monitoring of hydration status, avoidance of heat, cold, stress, and infection triggers. We discussed the need to be adherent with taking Hydrea  and other home medications. Patient was reminded of the need to seek medical attention immediately if any symptom of bleeding, anemia, or infection occurs.  Discharge Exam: Vitals:   06/30/24 0251 06/30/24 0642  BP: 126/79 118/81  Pulse: 79 78  Resp: 18 18  Temp: 97.8 F (36.6 C) 98.3 F (36.8 C)  SpO2: 100% 100%    Vitals:   06/29/24 2225 06/29/24 2243 06/30/24 0251 06/30/24 0642  BP: 132/76 129/69 126/79 118/81  Pulse: (!) 102 100 79 78  Resp: 18 16 18 18   Temp: (!) 101 F (38.3 C) 99.5 F (37.5 C) 97.8 F (36.6 C) 98.3 F (36.8 C)  TempSrc: Oral Oral Oral Oral  SpO2: 98% 97% 100% 100%  Weight:      Height:        General appearance : Awake, alert, not in any distress. Speech Clear. Not toxic looking HEENT: Atraumatic and Normocephalic, pupils equally reactive to light and accomodation Neck: Supple, no JVD. No cervical lymphadenopathy.  Chest: Good air entry bilaterally, no added sounds  CVS: S1 S2 regular, no murmurs.  Abdomen: Bowel sounds present, Non tender and not distended with no gaurding, rigidity or rebound. Extremities: B/L Lower Ext shows no edema, both legs are warm to touch Neurology: Awake alert, and oriented X 3, CN II-XII intact, Non focal Skin: No Rash  Discharge Instructions  Discharge Instructions     Call MD for:  persistant nausea and vomiting   Complete by: As directed    Call MD for:  severe uncontrolled pain   Complete by: As directed    Call MD for:  temperature >100.4   Complete by: As directed    Diet - low sodium heart healthy   Complete by: As directed    Increase activity slowly   Complete by: As directed       Allergies as of 06/30/2024       Reactions   Allergy Medication [  diphenhydramine ] Swelling, Other (See Comments)   Face swells        Medication List     TAKE these medications    acetaminophen  500 MG tablet Commonly known as: TYLENOL  Take 500-1,000 mg by mouth every 6 (six) hours as needed (for pain).   cyclobenzaprine  5 MG tablet Commonly known as: FLEXERIL  Take 1 tablet (5 mg total) by mouth 3 (three) times daily as needed for muscle spasms.   folic acid  1 MG tablet Commonly known as: FOLVITE  Take 1 tablet (1 mg total) by mouth daily.   hydroxyurea  500 MG capsule Commonly known as: HYDREA  Take 3 capsules (1,500 mg  total) by mouth daily. May take with food to minimize GI side effects.   ibuprofen  800 MG tablet Commonly known as: ADVIL  Take 1 tablet (800 mg total) by mouth every 8 (eight) hours as needed.   naloxone  4 MG/0.1ML Liqd nasal spray kit Commonly known as: NARCAN  Use 1 nasal spray as a single dose in one nostril; may repeat with a new nasal spray every 2 to 3 minutes in alternating nostrils if needed for oopoidoverdose Dispense: 1 each, Refills: 1 ordered   Oxycodone  HCl 10 MG Tabs Take 1 tablet (10 mg total) by mouth every 6 (six) hours as needed. What changed: reasons to take this   Vitamin D  (Ergocalciferol ) 1.25 MG (50000 UNIT) Caps capsule Commonly known as: DRISDOL  Take 1 capsule (50,000 Units total) by mouth every 7 (seven) days.        The results of significant diagnostics from this hospitalization (including imaging, microbiology, ancillary and laboratory) are listed below for reference.    Significant Diagnostic Studies: No results found.  Microbiology: No results found for this or any previous visit (from the past 240 hours).   Labs: Basic Metabolic Panel: Recent Labs  Lab 06/25/24 0517 06/25/24 1335 06/26/24 0703 06/28/24 0628  NA 143  --  136 138  K 4.0  --  4.1 3.9  CL 109  --  103 102  CO2 22  --  22 29  GLUCOSE 149*  --  107* 116*  BUN 12  --  12 15  CREATININE 0.83 0.74 0.74 0.82  CALCIUM 9.6  --  9.4 9.3   Liver Function Tests: Recent Labs  Lab 06/25/24 0517 06/26/24 0703 06/28/24 0628  AST 33 101* 63*  ALT 13 16 17   ALKPHOS 106 122 222*  BILITOT 1.6* 2.3* 2.6*  PROT 7.6 7.3 6.9  ALBUMIN 4.6 4.2 3.7   No results for input(s): LIPASE, AMYLASE in the last 168 hours. No results for input(s): AMMONIA in the last 168 hours. CBC: Recent Labs  Lab 06/25/24 0517 06/25/24 1335 06/26/24 0703 06/28/24 0628 06/30/24 0759  WBC 14.5* 20.8* 15.6* 11.2* PENDING  NEUTROABS 10.7*  --  12.4*  --   --   HGB 9.6* 9.3* 9.1* 8.0* 6.2*  HCT  28.5* 27.0* 26.5* 23.9* 18.4*  MCV 93.8 92.8 92.7 91.6 90.6  PLT 318 228 254 188 216   Cardiac Enzymes: No results for input(s): CKTOTAL, CKMB, CKMBINDEX, TROPONINI in the last 168 hours. BNP: Invalid input(s): POCBNP CBG: No results for input(s): GLUCAP in the last 168 hours.  Time coordinating discharge: 50 minutes  Signed:  Homer CHRISTELLA Cover  [facility] 06/30/2024, 9:40 AM

## 2024-06-30 NOTE — Telephone Encounter (Signed)
 Copied from CRM [id number]. Topic: Clinical - Medication Refill >> Jun 30, 2024  9:46 AM Victoria B wrote: Medication: oxyCODONE  (Oxy IR/ROXICODONE ) immediate release tablet 10 mg /ibuprofen  (ADVIL ) 800 MG tablet   Has the patient contacted their pharmacy? yes  (Agent: If yes, when and what did the pharmacy advise?)contact pcp  This is the patient's preferred pharmacy:  [facility] [address] [place] [address] Phone: [phone] Fax: [phone]  Is this the correct pharmacy for this prescription? yes    Has the prescription been filled recently? no  Is the patient out of the medication? yes  Has the patient been seen for an appointment in the last year OR does the patient have an upcoming appointment? yes  Can we respond through MyChart? yes  Agent: Please be advised that Rx refills may take up to 3 business days. We ask that you follow-up with your pharmacy.

## 2024-06-30 NOTE — Telephone Encounter (Signed)
 Already filled 06/30/24. [facility]

## 2024-06-30 NOTE — Discharge Summary (Addendum)
 Physician Discharge Summary  Jonathan [name] FMW:[id number] DOB: 04-Aug-2001 DOA: 06/25/2024  PCP: Paseda, Folashade R, FNP  Admit date: 06/25/2024  Discharge date: 06/30/2024  Discharge Diagnoses:  Principal Problem:   Sickle cell disease with crisis [facility]) Active Problems:   Hypertension   Avascular necrosis of bone of left hip (HCC)   Chronic obstruct airways disease (HCC)   Discharge Condition: Stable  Disposition:  Pt is discharged home in good condition and is to follow up with Paseda, Folashade R, FNP this week to have labs evaluated. Jonathan [name] is instructed to increase activity slowly and balance with rest for the next few days, and use prescribed medication to complete treatment of pain  Diet: Regular Wt Readings from Last 3 Encounters:  06/25/24 67.6 kg  02/17/24 66.7 kg  01/11/24 68 kg    History of present illness:   Jonathan [name] is a 23 y.o. male with medical history significant of sickle cell disease, chronic pain syndrome, asthma, avascular necrosis of the hip, history of hip replacements who presented to the ER with pain in his back and legs consistent with his typical sickle cell crisis.  Patient was seen and evaluated and found to have acute sickle cell crisis. Prior to this incidence patient has managed his sickle cell well at home.  No fever or chills, cough, headache, Urinary symptoms. No sick contacts or recent travels.   ED course:  BP 138/85   Pulse (!) 109   Temp 98.3 F (36.8 C) (Oral)   Resp 19   SpO2 96%  Patient was evaluated in the emergency department and treated with IV fluids, IV Dilaudid  with no symptom resolution.  Patient was therefore admitted for ongoing sickle cell pain management.  Hospital Course:  Patient was admitted for sickle cell pain crisis and managed appropriately with IVF, IV Dilaudid  via PCA and IV Toradol .  Patient gradually improved and reports pain at baseline this morning.  He is hemodynamically stable.  Hemoglobin  was found to be slightly below patient's baseline at time of discharge.  Patient will receive 1 unit packed red blood cells transfusion, posttransfusion H&H, and then be discharged afterwards.   Jonathan [name] will follow-up with PCP within 1 week of this discharge. Jonathan [name] was counseled extensively about nonpharmacologic means of pain management, patient verbalized understanding and was appreciative of  the care received during this admission.   We discussed the need for good hydration, monitoring of hydration status, avoidance of heat, cold, stress, and infection triggers. We discussed the need to be adherent with taking his home medications. Patient was reminded of the need to seek medical attention immediately if any symptom of bleeding, anemia, or infection occurs.  Discharge Exam: Vitals:   06/30/24 1230 06/30/24 1327  BP: 124/71 119/80  Pulse: 92 92  Resp: 19 14  Temp: 98.6 F (37 C) 99.3 F (37.4 C)  SpO2: 98% 99%   Vitals:   06/30/24 0947 06/30/24 1209 06/30/24 1230 06/30/24 1327  BP: 131/75 124/72 124/71 119/80  Pulse: (!) 101 96 92 92  Resp: 16 19 19 14   Temp: 99 F (37.2 C) 98.1 F (36.7 C) 98.6 F (37 C) 99.3 F (37.4 C)  TempSrc:  Oral Oral Oral  SpO2: 98% 99% 98% 99%  Weight:      Height:        General appearance : Awake, alert, not in any distress. Speech Clear. Not toxic looking HEENT: Atraumatic and Normocephalic, pupils equally reactive to light and accomodation Neck:  Supple, no JVD. No cervical lymphadenopathy.  Chest: Good air entry bilaterally, no added sounds  CVS: S1 S2 regular, no murmurs.  Abdomen: Bowel sounds present, Non tender and not distended with no gaurding, rigidity or rebound. Extremities: B/L Lower Ext shows no edema, both legs are warm to touch Neurology: Awake alert, and oriented X 3, CN II-XII intact, Non focal Skin: No Rash  Discharge Instructions  Discharge Instructions     Call MD for:  persistant nausea and vomiting   Complete by:  As directed    Call MD for:  severe uncontrolled pain   Complete by: As directed    Call MD for:  temperature >100.4   Complete by: As directed    Diet - low sodium heart healthy   Complete by: As directed    Increase activity slowly   Complete by: As directed       Allergies as of 06/30/2024       Reactions   Allergy Medication [diphenhydramine ] Swelling, Other (See Comments)   Face swells        Medication List     TAKE these medications    acetaminophen  500 MG tablet Commonly known as: TYLENOL  Take 500-1,000 mg by mouth every 6 (six) hours as needed (for pain). What changed: Another medication with the same name was added. Make sure you understand how and when to take each.   acetaminophen  325 MG tablet Commonly known as: TYLENOL  Take 2 tablets (650 mg total) by mouth once for 1 dose. What changed: You were already taking a medication with the same name, and this prescription was added. Make sure you understand how and when to take each.   cyclobenzaprine  5 MG tablet Commonly known as: FLEXERIL  Take 1 tablet (5 mg total) by mouth 3 (three) times daily as needed for muscle spasms.   folic acid  1 MG tablet Commonly known as: FOLVITE  Take 1 tablet (1 mg total) by mouth daily.   hydroxyurea  500 MG capsule Commonly known as: HYDREA  Take 3 capsules (1,500 mg total) by mouth daily. May take with food to minimize GI side effects.   ibuprofen  800 MG tablet Commonly known as: ADVIL  Take 1 tablet (800 mg total) by mouth every 8 (eight) hours as needed.   naloxone  4 MG/0.1ML Liqd nasal spray kit Commonly known as: NARCAN  Use 1 nasal spray as a single dose in one nostril; may repeat with a new nasal spray every 2 to 3 minutes in alternating nostrils if needed for oopoidoverdose Dispense: 1 each, Refills: 1 ordered   Oxycodone  HCl 10 MG Tabs Take 1 tablet (10 mg total) by mouth every 6 (six) hours as needed. What changed: reasons to take this   sodium chloride  0.9 %  infusion Inject 10 mLs into the vein once for 1 dose.   Vitamin D  (Ergocalciferol ) 1.25 MG (50000 UNIT) Caps capsule Commonly known as: DRISDOL  Take 1 capsule (50,000 Units total) by mouth every 7 (seven) days.        The results of significant diagnostics from this hospitalization (including imaging, microbiology, ancillary and laboratory) are listed below for reference.    Significant Diagnostic Studies: No results found.  Microbiology: No results found for this or any previous visit (from the past 240 hours).   Labs: Basic Metabolic Panel: Recent Labs  Lab 06/25/24 0517 06/25/24 1335 06/26/24 0703 06/28/24 0628  NA 143  --  136 138  K 4.0  --  4.1 3.9  CL 109  --  103  102  CO2 22  --  22 29  GLUCOSE 149*  --  107* 116*  BUN 12  --  12 15  CREATININE 0.83 0.74 0.74 0.82  CALCIUM 9.6  --  9.4 9.3   Liver Function Tests: Recent Labs  Lab 06/25/24 0517 06/26/24 0703 06/28/24 0628  AST 33 101* 63*  ALT 13 16 17   ALKPHOS 106 122 222*  BILITOT 1.6* 2.3* 2.6*  PROT 7.6 7.3 6.9  ALBUMIN 4.6 4.2 3.7   No results for input(s): LIPASE, AMYLASE in the last 168 hours. No results for input(s): AMMONIA in the last 168 hours. CBC: Recent Labs  Lab 06/25/24 0517 06/25/24 1335 06/26/24 0703 06/28/24 0628 06/30/24 0759  WBC 14.5* 20.8* 15.6* 11.2* 6.2  NEUTROABS 10.7*  --  12.4*  --   --   HGB 9.6* 9.3* 9.1* 8.0* 6.2*  HCT 28.5* 27.0* 26.5* 23.9* 18.4*  MCV 93.8 92.8 92.7 91.6 90.6  PLT 318 228 254 188 216   Cardiac Enzymes: No results for input(s): CKTOTAL, CKMB, CKMBINDEX, TROPONINI in the last 168 hours. BNP: Invalid input(s): POCBNP CBG: No results for input(s): GLUCAP in the last 168 hours.  Time coordinating discharge: 50 minutes  Signed:  Homer CHRISTELLA Cover NP  [facility] 06/30/2024, 2:52 PM

## 2024-06-30 NOTE — Plan of Care (Signed)
  Problem: Education: Goal: Knowledge of General Education information will improve Description: Including pain rating scale, medication(s)/side effects and non-pharmacologic comfort measures Outcome: Progressing   Problem: Health Behavior/Discharge Planning: Goal: Ability to manage health-related needs will improve Outcome: Progressing   Problem: Clinical Measurements: Goal: Ability to maintain clinical measurements within normal limits will improve Outcome: Progressing Goal: Will remain free from infection Outcome: Progressing Goal: Diagnostic test results will improve Outcome: Progressing Goal: Respiratory complications will improve Outcome: Progressing Goal: Cardiovascular complication will be avoided Outcome: Progressing   Problem: Nutrition: Goal: Adequate nutrition will be maintained Outcome: Progressing   Problem: Coping: Goal: Level of anxiety will decrease Outcome: Progressing   Problem: Elimination: Goal: Will not experience complications related to bowel motility Outcome: Progressing Goal: Will not experience complications related to urinary retention Outcome: Progressing   Problem: Pain Managment: Goal: General experience of comfort will improve and/or be controlled Outcome: Progressing   Problem: Safety: Goal: Ability to remain free from injury will improve Outcome: Progressing   Problem: Education: Goal: Knowledge of vaso-occlusive preventative measures will improve Outcome: Progressing Goal: Awareness of infection prevention will improve Outcome: Progressing Goal: Awareness of signs and symptoms of anemia will improve Outcome: Progressing Goal: Long-term complications will improve Outcome: Progressing   Problem: Bowel/Gastric: Goal: Gut motility will be maintained Outcome: Progressing   Problem: Tissue Perfusion: Goal: Complications related to inadequate tissue perfusion will be avoided or minimized Outcome: Progressing   Problem:  Respiratory: Goal: Pulmonary complications will be avoided or minimized Outcome: Progressing Goal: Acute Chest Syndrome will be identified early to prevent complications Outcome: Progressing

## 2024-06-30 NOTE — Plan of Care (Signed)

## 2024-07-01 ENCOUNTER — Other Ambulatory Visit: Payer: Self-pay | Admitting: Nurse Practitioner

## 2024-07-01 ENCOUNTER — Telehealth: Payer: Self-pay

## 2024-07-01 LAB — BPAM RBC
Blood Product Expiration Date: 202511132359
ISSUE DATE / TIME: 202511061203
Unit Type and Rh: 9500

## 2024-07-01 LAB — TYPE AND SCREEN
ABO/RH(D): O POS
Antibody Screen: NEGATIVE
Unit division: 0

## 2024-07-01 NOTE — Transitions of Care (Post Inpatient/ED Visit) (Signed)
   07/01/2024  Name: Jonathan [name] MRN: [id number] DOB: 11-04-2000  Today's TOC FU Call Status: Today's TOC FU Call Status:: Unsuccessful Call (1st Attempt) Unsuccessful Call (1st Attempt) Date: 07/01/24  Attempted to reach the patient regarding the most recent Inpatient/ED visit.  Follow Up Plan: Additional outreach attempts will be made to reach the patient to complete the Transitions of Care (Post Inpatient/ED visit) call.   Arvin Seip RN, BSN, CCM [employer], Population Health Case Manager Phone: [phone]

## 2024-07-04 ENCOUNTER — Other Ambulatory Visit (HOSPITAL_COMMUNITY): Payer: Self-pay

## 2024-07-04 ENCOUNTER — Ambulatory Visit (INDEPENDENT_AMBULATORY_CARE_PROVIDER_SITE_OTHER): Payer: Self-pay | Admitting: Nurse Practitioner

## 2024-07-04 ENCOUNTER — Encounter: Payer: Self-pay | Admitting: Nurse Practitioner

## 2024-07-04 VITALS — BP 117/61 | HR 96 | Wt 149.0 lb

## 2024-07-04 DIAGNOSIS — D571 Sickle-cell disease without crisis: Secondary | ICD-10-CM

## 2024-07-04 DIAGNOSIS — E559 Vitamin D deficiency, unspecified: Secondary | ICD-10-CM | POA: Diagnosis not present

## 2024-07-04 DIAGNOSIS — Z79891 Long term (current) use of opiate analgesic: Secondary | ICD-10-CM | POA: Diagnosis not present

## 2024-07-04 MED ORDER — VITAMIN D (ERGOCALCIFEROL) 1.25 MG (50000 UNIT) PO CAPS
50000.0000 [IU] | ORAL_CAPSULE | ORAL | 1 refills | Status: AC
  Filled 2024-07-04: qty 4, 28d supply, fill #0

## 2024-07-04 MED ORDER — HYDROXYUREA 500 MG PO CAPS
1500.0000 mg | ORAL_CAPSULE | Freq: Every day | ORAL | 3 refills | Status: DC
  Filled 2024-07-04: qty 90, 30d supply, fill #0

## 2024-07-04 MED ORDER — FOLIC ACID 1 MG PO TABS
1.0000 mg | ORAL_TABLET | Freq: Every day | ORAL | 1 refills | Status: AC
  Filled 2024-07-04: qty 30, 30d supply, fill #0

## 2024-07-04 NOTE — Assessment & Plan Note (Addendum)
 Last vitamin D  Lab Results  Component Value Date   VD25OH 21.0 (L) 02/17/2024  Continue vitamin D  50,000 units once weekly

## 2024-07-04 NOTE — Assessment & Plan Note (Signed)
 Sickle cell disease with recent vaso-occlusive crisis and chronic pain Recent hospitalization for sickle cell crisis. Mild leg pain currently. Blood transfusion administered during hospitalization. No recent oxycodone  use. No new medication allergies. - Continue ibuprofen  800 mg every 8 hours as needed for mild pain. - Continue oxycodone  10 mg every 6 hours as needed for moderate to severe pain. - Continue folic acid  1 mg daily. - Conducted routine drug screening today. - Encouraged safe storage of medications and to report any excessive sedation. - Encouraged maintaining a supply of pain medications to avoid emergency room visits. - Advised on signs of sickle cell crisis and to seek hospital care if symptoms occur. - Encouraged hydration and dietary measures to prevent constipation associated with pain medication use. - Referred to an eye doctor for annual retinopathy screening. - Encouraged flu vaccination before January to prevent flu-related complications. - Scheduled follow-up appointment for lab work to check blood count.

## 2024-07-04 NOTE — Patient Instructions (Addendum)
 1. Vitamin D  deficiency (Primary)   2. Chronic prescription opiate use   3. Hb-SS disease without crisis [facility])  - Ambulatory referral to Ophthalmology - Sickle Cell Panel; Future - ToxAssure Flex 15, Ur; Future   It is important that you exercise regularly at least 30 minutes 5 times a week as tolerated  Think about what you will eat, plan ahead. Choose  clean, green, fresh or frozen over canned, processed or packaged foods which are more sugary, salty and fatty. 70 to 75% of food eaten should be vegetables and fruit. Three meals at set times with snacks allowed between meals, but they must be fruit or vegetables. Aim to eat over a 12 hour period , example 7 am to 7 pm, and STOP after  your last meal of the day. Drink water ,generally about 64 ounces per day, no other drink is as healthy. Fruit juice is best enjoyed in a healthy way, by EATING the fruit.  Thanks for choosing [facility] we consider it a privelige to serve you.

## 2024-07-04 NOTE — Assessment & Plan Note (Signed)
 Takes oxycodone  10 mg every 6 hours as needed for moderate to severe pain and ibuprofen  800 mg every 8 hours as needed for mild pain Continue current medications

## 2024-07-04 NOTE — Progress Notes (Signed)
 Established Patient Office Visit  Subjective:  Patient ID: Jonathan [name], male    DOB: 2001/06/23  Age: 23 y.o. MRN: [id number]  CC:  Chief Complaint  Patient presents with   Hospitalization Follow-up    HPI   Discussed the use of AI scribe software for clinical note transcription with the patient, who gave verbal consent to proceed.  History of Present Illness Jonathan [name] is a 23 year old male  has a past medical history of Acute chest syndrome(517.3) (08/26/2011), Asthma, Avascular necrosis of bone of left hip (HCC), Marijuana smoker (02/17/2024), Sickle cell disease, type SS (HCC), and Vitamin D  deficiency (11/17/2023).  with sickle cell disease who presents for follow-up after recent hospitalization for sickle cell crisis.  He was hospitalized from November 1st to November 6th for a sickle cell crisis, during which he received one unit of blood. Since discharge, his pain has improved and is currently rated at 2 out of 10, localized to his left leg. No fever, chills, chest pain, or shortness of breath are present.  His current medications include ibuprofen  800 mg every eight hours as needed for mild pain and oxycodone  10 mg every six hours as needed for moderate to severe pain, though he has not taken oxycodone  since discharge. He also takes vitamin D  50,000 units once a week, hydroxyurea  1,000 mg one day and 1,500 mg the next, and folic acid  1 mg daily. He does not use Tylenol  or muscle relaxants currently.  He works at [employer] and previously used muscle relaxants for muscle spasms. He has not used any illicit drugs and ensures his medications are safe and secure. He has not received the flu vaccine yet, preferring to postpone it until January.     Assessment & Plan        Past Medical History:  Diagnosis Date   Acute chest syndrome(517.3) 08/26/2011   march 2013, Sept 2013   Asthma    Avascular necrosis of bone of left hip (HCC)    Marijuana smoker 02/17/2024    Sickle cell disease, type SS (HCC)    Vitamin D  deficiency 11/17/2023    Past Surgical History:  Procedure Laterality Date   ADENOIDECTOMY     SPLENECTOMY     TONSILLECTOMY     TONSILLECTOMY AND ADENOIDECTOMY     TOTAL HIP ARTHROPLASTY Left 03/07/2016   UMBILICAL HERNIA REPAIR      Family History  Problem Relation Age of Onset   Hypertension Mother    Hypertension Maternal Grandmother    Hypertension Maternal Grandfather    Asthma Paternal Grandfather    Diabetes Paternal Grandfather     Social History   Socioeconomic History   Marital status: Single    Spouse name: Not on file   Number of children: Not on file   Years of education: Not on file   Highest education level: Not on file  Occupational History   Not on file  Tobacco Use   Smoking status: Never   Smokeless tobacco: Never  Vaping Use   Vaping status: Never Used  Substance and Sexual Activity   Alcohol use: No    Alcohol/week: 0.0 standard drinks of alcohol   Drug use: No   Sexual activity: Never  Other Topics Concern   Not on file  Social History Narrative   Lives with a room mate    Social Drivers of Health   Financial Resource Strain: Not on file  Food Insecurity: No Food Insecurity (06/26/2024)  Hunger Vital Sign    Worried About Running Out of Food in the Last Year: Never true    Ran Out of Food in the Last Year: Never true  Transportation Needs: No Transportation Needs (06/26/2024)   PRAPARE - Administrator, Civil Service (Medical): No    Lack of Transportation (Non-Medical): No  Physical Activity: Not on file  Stress: Not on file  Social Connections: Unknown (01/11/2024)   Social Connection and Isolation Panel    Frequency of Communication with Friends and Family: Not on file    Frequency of Social Gatherings with Friends and Family: More than three times a week    Attends Religious Services: 1 to 4 times per year    Active Member of [employer] or Organizations: No    Attends Occupational Hygienist Meetings: 1 to 4 times per year    Marital Status: Patient declined  Intimate Partner Violence: Not At Risk (06/26/2024)   Humiliation, Afraid, Rape, and Kick questionnaire    Fear of Current or Ex-Partner: No    Emotionally Abused: No    Physically Abused: No    Sexually Abused: No    Outpatient Medications Prior to Visit  Medication Sig Dispense Refill   ibuprofen  (ADVIL ) 800 MG tablet Take 1 tablet (800 mg total) by mouth every 8 (eight) hours as needed. 30 tablet 4   naloxone  (NARCAN ) nasal spray 4 mg/0.1 mL Use 1 nasal spray as a single dose in one nostril; may repeat with a new nasal spray every 2 to 3 minutes in alternating nostrils if needed for oopoidoverdose Dispense: 1 each, Refills: 1 ordered 1 each 1   Oxycodone  HCl 10 MG TABS Take 1 tablet (10 mg total) by mouth every 6 (six) hours as needed (Pain). 30 tablet 0   folic acid  (FOLVITE ) 1 MG tablet Take 1 tablet (1 mg total) by mouth daily. 90 tablet 1   hydroxyurea  (HYDREA ) 500 MG capsule Take 3 capsules (1,500 mg total) by mouth daily. May take with food to minimize GI side effects. 180 capsule 3   Vitamin D , Ergocalciferol , (DRISDOL ) 1.25 MG (50000 UNIT) CAPS capsule Take 1 capsule (50,000 Units total) by mouth every 7 (seven) days. 12 capsule 1   acetaminophen  (TYLENOL ) 500 MG tablet Take 500-1,000 mg by mouth every 6 (six) hours as needed (for pain). (Patient not taking: Reported on 07/04/2024)     cyclobenzaprine  (FLEXERIL ) 5 MG tablet Take 1 tablet (5 mg total) by mouth 3 (three) times daily as needed for muscle spasms. (Patient not taking: Reported on 07/04/2024) 20 tablet 0   No facility-administered medications prior to visit.    Allergies  Allergen Reactions   Allergy Medication [Diphenhydramine ] Swelling and Other (See Comments)    Face swells    ROS Review of Systems    Objective:    Physical Exam Vitals and nursing note reviewed.  Constitutional:      General: He is not in acute  distress.    Appearance: Normal appearance. He is not ill-appearing, toxic-appearing or diaphoretic.  Eyes:     General: No scleral icterus.       Right eye: No discharge.        Left eye: No discharge.     Extraocular Movements: Extraocular movements intact.     Conjunctiva/sclera: Conjunctivae normal.  Cardiovascular:     Rate and Rhythm: Normal rate and regular rhythm.     Pulses: Normal pulses.     Heart sounds: Normal heart  sounds. No murmur heard.    No friction rub. No gallop.  Pulmonary:     Effort: Pulmonary effort is normal. No respiratory distress.     Breath sounds: Normal breath sounds. No stridor. No wheezing, rhonchi or rales.  Chest:     Chest wall: No tenderness.  Abdominal:     General: There is no distension.     Palpations: Abdomen is soft.     Tenderness: There is no abdominal tenderness. There is no right CVA tenderness, left CVA tenderness or guarding.  Musculoskeletal:        General: No swelling, tenderness, deformity or signs of injury.     Right lower leg: No edema.     Left lower leg: No edema.  Skin:    General: Skin is warm and dry.     Capillary Refill: Capillary refill takes less than 2 seconds.     Coloration: Skin is not jaundiced or pale.     Findings: No bruising, erythema or lesion.  Neurological:     Mental Status: He is alert and oriented to person, place, and time.     Motor: No weakness.     Gait: Gait normal.  Psychiatric:        Mood and Affect: Mood normal.        Behavior: Behavior normal.        Thought Content: Thought content normal.        Judgment: Judgment normal.     BP 117/61   Pulse 96   Wt 149 lb (67.6 kg)   SpO2 100%   BMI 24.79 kg/m  Wt Readings from Last 3 Encounters:  07/04/24 149 lb (67.6 kg)  06/25/24 149 lb 0.5 oz (67.6 kg)  02/17/24 147 lb (66.7 kg)    No results found for: T[facility] Lab Results  Component Value Date   WBC 6.2 06/30/2024   HGB 7.7 (L) 06/30/2024   HCT 22.5 (L) 06/30/2024   MCV 90.6  06/30/2024   PLT 216 06/30/2024   Lab Results  Component Value Date   NA 138 06/28/2024   K 3.9 06/28/2024   CO2 29 06/28/2024   GLUCOSE 116 (H) 06/28/2024   BUN 15 06/28/2024   CREATININE 0.82 06/28/2024   BILITOT 2.6 (H) 06/28/2024   ALKPHOS 222 (H) 06/28/2024   AST 63 (H) 06/28/2024   ALT 17 06/28/2024   PROT 6.9 06/28/2024   ALBUMIN 3.7 06/28/2024   CALCIUM 9.3 06/28/2024   ANIONGAP 8 06/28/2024   EGFR 119 11/17/2023   No results found for: CHOL No results found for: HDL No results found for: [facility] Lab Results  Component Value Date   TRIG 44 10/23/2020   No results found for: CHOLHDL No results found for: YHAJ8R    Assessment & Plan:   Problem List Items Addressed This Visit       Other   Vitamin D  deficiency - Primary   Last vitamin D  Lab Results  Component Value Date   VD25OH 21.0 (L) 02/17/2024  Continue vitamin D  50,000 units once weekly      Relevant Medications   Vitamin D , Ergocalciferol , (DRISDOL ) 1.25 MG (50000 UNIT) CAPS capsule   Chronic prescription opiate use   Takes oxycodone  10 mg every 6 hours as needed for moderate to severe pain and ibuprofen  800 mg every 8 hours as needed for mild pain Continue current medications      Hb-SS disease without crisis (HCC)   Sickle cell disease with recent vaso-occlusive  crisis and chronic pain Recent hospitalization for sickle cell crisis. Mild leg pain currently. Blood transfusion administered during hospitalization. No recent oxycodone  use. No new medication allergies. - Continue ibuprofen  800 mg every 8 hours as needed for mild pain. - Continue oxycodone  10 mg every 6 hours as needed for moderate to severe pain. - Continue folic acid  1 mg daily. - Conducted routine drug screening today. - Encouraged safe storage of medications and to report any excessive sedation. - Encouraged maintaining a supply of pain medications to avoid emergency room visits. - Advised on signs of sickle cell  crisis and to seek hospital care if symptoms occur. - Encouraged hydration and dietary measures to prevent constipation associated with pain medication use. - Referred to an eye doctor for annual retinopathy screening. - Encouraged flu vaccination before January to prevent flu-related complications. - Scheduled follow-up appointment for lab work to check blood count.      Relevant Medications   folic acid  (FOLVITE ) 1 MG tablet   hydroxyurea  (HYDREA ) 500 MG capsule   Other Relevant Orders   Ambulatory referral to Ophthalmology   Sickle Cell Panel   ToxAssure Flex 15, Ur    Meds ordered this encounter  Medications   folic acid  (FOLVITE ) 1 MG tablet    Sig: Take 1 tablet (1 mg total) by mouth daily.    Dispense:  90 tablet    Refill:  1   hydroxyurea  (HYDREA ) 500 MG capsule    Sig: Take 3 capsules (1,500 mg total) by mouth daily. May take with food to minimize GI side effects.    Dispense:  180 capsule    Refill:  3   Vitamin D , Ergocalciferol , (DRISDOL ) 1.25 MG (50000 UNIT) CAPS capsule    Sig: Take 1 capsule (50,000 Units total) by mouth every 7 (seven) days.    Dispense:  12 capsule    Refill:  1    Follow-up: Return in about 3 months (around 10/04/2024) for CPE.    Norvella Loscalzo R Aleiya Rye, FNP

## 2024-07-05 ENCOUNTER — Telehealth: Payer: Self-pay

## 2024-07-05 ENCOUNTER — Other Ambulatory Visit (HOSPITAL_COMMUNITY): Payer: Self-pay

## 2024-07-05 MED ORDER — OXYCODONE HCL 10 MG PO TABS
10.0000 mg | ORAL_TABLET | Freq: Four times a day (QID) | ORAL | 0 refills | Status: DC | PRN
  Filled 2024-07-08: qty 60, 15d supply, fill #0

## 2024-07-05 NOTE — Transitions of Care (Post Inpatient/ED Visit) (Signed)
   07/05/2024  Name: Jonathan [name] MRN: [id number] DOB: May 30, 2001  Today's TOC FU Call Status: Today's TOC FU Call Status:: Unsuccessful Call (2nd Attempt) Unsuccessful Call (2nd Attempt) Date: 07/05/24  Attempted to reach the patient regarding the most recent Inpatient/ED visit.  Follow Up Plan: Additional outreach attempts will be made to reach the patient to complete the Transitions of Care (Post Inpatient/ED visit) call.   Arvin Seip RN, BSN, CCM [employer], Population Health Case Manager Phone: [phone]

## 2024-07-05 NOTE — Telephone Encounter (Signed)
 Please advise [facility]

## 2024-07-05 NOTE — Telephone Encounter (Signed)
 Reviewed PDMP substance reporting system prior to prescribing opiate medications. No inconsistencies noted.     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  Dispense: 60 tablet; Refill: 0  2. Chronic prescription opiate use  - Oxycodone  HCl 10 MG TABS; Take 1 tablet (10 mg total) by mouth every 6 (six) hours as needed.  Dispense: 60 tablet; Refill: 0

## 2024-07-06 ENCOUNTER — Telehealth: Payer: Self-pay

## 2024-07-06 NOTE — Transitions of Care (Post Inpatient/ED Visit) (Signed)
   07/06/2024  Name: Jonathan [name] MRN: [id number] DOB: Jul 11, 2001  Today's TOC FU Call Status: Today's TOC FU Call Status:: Unsuccessful Call (3rd Attempt) Unsuccessful Call (3rd Attempt) Date: 07/06/24  Attempted to reach the patient regarding the most recent Inpatient/ED visit.  Follow Up Plan: No further outreach attempts will be made at this time. We have been unable to contact the patient.  Arvin Seip RN, BSN, CCM [employer], Population Health Case Manager Phone: [phone]

## 2024-07-08 ENCOUNTER — Other Ambulatory Visit (HOSPITAL_COMMUNITY): Payer: Self-pay

## 2024-07-08 LAB — TOXASSURE FLEX 15, UR
6-ACETYLMORPHINE IA: NEGATIVE ng/mL
7-aminoclonazepam: NOT DETECTED ng/mg{creat}
AMPHETAMINES IA: NEGATIVE ng/mL
Alpha-hydroxyalprazolam: NOT DETECTED ng/mg{creat}
Alpha-hydroxymidazolam: NOT DETECTED ng/mg{creat}
Alpha-hydroxytriazolam: NOT DETECTED ng/mg{creat}
Alprazolam: NOT DETECTED ng/mg{creat}
BARBITURATES IA: NEGATIVE ng/mL
Buprenorphine: NOT DETECTED ng/mg{creat}
COCAINE METABOLITE IA: NEGATIVE ng/mL
Clonazepam: NOT DETECTED ng/mg{creat}
Creatinine: 97 mg/dL (ref >=20)
Desalkylflurazepam: NOT DETECTED ng/mg{creat}
Desmethyldiazepam: NOT DETECTED ng/mg{creat}
Desmethylflunitrazepam: NOT DETECTED ng/mg{creat}
Diazepam: NOT DETECTED ng/mg{creat}
ETHYL ALCOHOL Enzymatic: NEGATIVE g/dL
Fentanyl: NOT DETECTED ng/mg{creat}
Flunitrazepam: NOT DETECTED ng/mg{creat}
Lorazepam: NOT DETECTED ng/mg{creat}
METHADONE IA: NEGATIVE ng/mL
METHADONE MTB IA: NEGATIVE ng/mL
Midazolam: NOT DETECTED ng/mg{creat}
Norbuprenorphine: NOT DETECTED ng/mg{creat}
Norfentanyl: NOT DETECTED ng/mg{creat}
OPIATE CLASS IA: NEGATIVE ng/mL
Oxazepam: NOT DETECTED ng/mg{creat}
PHENCYCLIDINE IA: NEGATIVE ng/mL
TAPENTADOL, IA: NEGATIVE ng/mL
TRAMADOL IA: NEGATIVE ng/mL
Temazepam: NOT DETECTED ng/mg{creat}

## 2024-07-08 LAB — OXYCODONE CLASS, MS, UR RFX
Noroxycodone: 210 ng/mg{creat}
Noroxymorphone: 326 ng/mg{creat}
Oxycodone Class Confirmation: POSITIVE — AB
Oxycodone: NOT DETECTED ng/mg{creat}
Oxymorphone: 1148 ng/mg{creat}

## 2024-07-08 LAB — CANNABINOIDS, MS, UR RFX
Cannabinoids Confirmation: POSITIVE — AB
Carboxy-THC: 69 ng/mg{creat}

## 2024-07-12 ENCOUNTER — Other Ambulatory Visit: Payer: Self-pay

## 2024-07-15 ENCOUNTER — Other Ambulatory Visit (HOSPITAL_COMMUNITY): Payer: Self-pay

## 2024-07-22 ENCOUNTER — Other Ambulatory Visit (HOSPITAL_COMMUNITY): Payer: Self-pay

## 2024-08-02 ENCOUNTER — Other Ambulatory Visit: Payer: Self-pay

## 2024-08-02 ENCOUNTER — Emergency Department (HOSPITAL_COMMUNITY)
Admission: EM | Admit: 2024-08-02 | Discharge: 2024-08-02 | Disposition: A | Discharge status: Home / Self Care | Attending: Emergency Medicine | Admitting: Emergency Medicine

## 2024-08-02 ENCOUNTER — Telehealth: Payer: Self-pay

## 2024-08-02 ENCOUNTER — Ambulatory Visit: Payer: Self-pay

## 2024-08-02 DIAGNOSIS — D5700 Hb-SS disease with crisis, unspecified: Secondary | ICD-10-CM

## 2024-08-02 LAB — COMPREHENSIVE METABOLIC PANEL WITH GFR
ALT: 9 U/L (ref 0–44)
AST: 24 U/L (ref 15–41)
Albumin: 4.4 g/dL (ref 3.5–5.0)
Alkaline Phosphatase: 159 U/L — ABNORMAL HIGH (ref 38–126)
Anion gap: 9 (ref 5–15)
BUN: 8 mg/dL (ref 6–20)
CO2: 25 mmol/L (ref 22–32)
Calcium: 9.4 mg/dL (ref 8.9–10.3)
Chloride: 107 mmol/L (ref 98–111)
Creatinine, Ser: 0.77 mg/dL (ref 0.61–1.24)
GFR, Estimated: >60 mL/min (ref >60)
Glucose, Bld: 96 mg/dL (ref 70–99)
Potassium: 5 mmol/L (ref 3.5–5.1)
Sodium: 142 mmol/L (ref 135–145)
Total Bilirubin: 1 mg/dL (ref 0.0–1.2)
Total Protein: 7.4 g/dL (ref 6.5–8.1)

## 2024-08-02 LAB — CBC WITH DIFFERENTIAL/PLATELET
Abs Immature Granulocytes: 0.05 K/uL (ref 0.00–0.07)
Basophils Absolute: 0.1 K/uL (ref 0.0–0.1)
Basophils Relative: 1 %
Eosinophils Absolute: 0.2 K/uL (ref 0.0–0.5)
Eosinophils Relative: 2 %
HCT: 30.8 % — ABNORMAL LOW (ref 39.0–52.0)
Hemoglobin: 10.5 g/dL — ABNORMAL LOW (ref 13.0–17.0)
Immature Granulocytes: 1 %
Lymphocytes Relative: 25 %
Lymphs Abs: 2.4 K/uL (ref 0.7–4.0)
MCH: 31.3 pg (ref 26.0–34.0)
MCHC: 34.1 g/dL (ref 30.0–36.0)
MCV: 91.9 fL (ref 80.0–100.0)
Monocytes Absolute: 0.8 K/uL (ref 0.1–1.0)
Monocytes Relative: 9 %
Neutro Abs: 5.9 K/uL (ref 1.7–7.7)
Neutrophils Relative %: 62 %
Platelets: 292 K/uL (ref 150–400)
RBC: 3.35 MIL/uL — ABNORMAL LOW (ref 4.22–5.81)
RDW: 20.4 % — ABNORMAL HIGH (ref 11.5–15.5)
WBC: 9.4 K/uL (ref 4.0–10.5)
nRBC: 1.6 % — ABNORMAL HIGH (ref 0.0–0.2)

## 2024-08-02 LAB — RETICULOCYTES
Immature Retic Fract: 42.9 % — ABNORMAL HIGH (ref 2.3–15.9)
RBC.: 3.35 MIL/uL — ABNORMAL LOW (ref 4.22–5.81)
Retic Count, Absolute: 249.9 K/uL — ABNORMAL HIGH (ref 19.0–186.0)
Retic Ct Pct: 7.5 % — ABNORMAL HIGH (ref 0.4–3.1)

## 2024-08-02 MED ORDER — HYDROMORPHONE HCL 1 MG/ML IJ SOLN
2.0000 mg | INTRAMUSCULAR | Status: AC
  Administered 2024-08-02: 2 mg via INTRAVENOUS
  Filled 2024-08-02: qty 2

## 2024-08-02 MED ORDER — OXYCODONE HCL 5 MG PO TABS
15.0000 mg | ORAL_TABLET | Freq: Once | ORAL | Status: AC
  Administered 2024-08-02: 15 mg via ORAL
  Filled 2024-08-02: qty 3

## 2024-08-02 MED ORDER — HYDROMORPHONE HCL 1 MG/ML IJ SOLN: 2.0000 mg | INTRAMUSCULAR | Status: AC

## 2024-08-02 MED ORDER — HYDROMORPHONE HCL 1 MG/ML IJ SOLN: 1.0000 mg | Freq: Once | INTRAMUSCULAR | Status: DC

## 2024-08-02 MED ORDER — SODIUM CHLORIDE 0.45 % IV SOLN: INTRAVENOUS | Status: DC

## 2024-08-02 MED ORDER — KETOROLAC TROMETHAMINE 15 MG/ML IJ SOLN
15.0000 mg | INTRAMUSCULAR | Status: AC
  Administered 2024-08-02: 15 mg via INTRAVENOUS
  Filled 2024-08-02: qty 1

## 2024-08-02 NOTE — Telephone Encounter (Signed)
Pt was called and advised [facility] 

## 2024-08-02 NOTE — ED Triage Notes (Signed)
 PT ambulatory to triage reporting sickle cell pain in his RIGHT leg that began yesterday. Pt denies missing any medications. PT reports that he is out of tylenol  and oxycodone . Pt reports attempting to go to the sickle cell clinic, but they are closed today.

## 2024-08-02 NOTE — Telephone Encounter (Signed)
 FYI Only or Action Required?: FYI only for provider: ED advised.  Patient was last seen in primary care on 07/04/2024 by Paseda, Folashade R, FNP.  Called Nurse Triage reporting Leg Pain.  Symptoms began yesterday.  Interventions attempted: OTC medications: tylenol .  Symptoms are: unchanged.  Triage Disposition: Go to ED Now (or PCP Triage)  Patient/caregiver understands and will follow disposition?: Yes  Copied from CRM #[id number]. Topic: Clinical - Red Word Triage >> Aug 02, 2024 10:36 AM Ivette P wrote: Red Word that prompted transfer to Nurse Triage:  slight pain on legs. not extreme. wondering if should go to the ER. sickle cell clinic says they do not have any beds available. Reason for Disposition  Patient sounds very sick or weak to the triager    Sickle cell crisis; leg pain  Answer Assessment - Initial Assessment Questions No available appts with sickle cell center.   Advised ED now and 911 if symptoms worsens: diff breathing, chest pain lasting more than 5 minutes or worsen.  Patient and pt's mom verbalized understanding.  1. ONSET: When did the pain start?      yesterday 2. LOCATION: Where is the pain located?      Both legs 3. PAIN: How bad is the pain?    (Scale 1-10; or mild, moderate, severe)     7/10; tylenol  not effective 4. WORK OR EXERCISE: Has there been any recent work or exercise that involved this part of the body?      Working yesterday 5. CAUSE: What do you think is causing the leg pain?     Feels like start of crisis 6. OTHER SYMPTOMS: Do you have any other symptoms? (e.g., chest pain, back pain, breathing difficulty, swelling, rash, fever, numbness, weakness)     Denies diff breathing, chest pain, fever, chills, n/v  Reports standing and walking makes it worse.  Protocols used: Leg Pain-A-AH

## 2024-08-02 NOTE — Telephone Encounter (Signed)
 Copied from CRM #[id number]. Topic: General - Other >> Aug 02, 2024  9:24 AM Antony RAMAN wrote: Reason for CRM: wanting to know if theres a bed in the day hospital, [phone]  Advised pt that Day hospital was not open. Kh

## 2024-08-02 NOTE — ED Provider Notes (Signed)
 [facility]PART[facility] AT [facility] Provider Note   CSN: [id number] Arrival date & time: 08/02/24  1219     Patient presents with: Sickle Cell Pain Crisis and Leg Pain   Jonathan [name] is a 23 y.o. male.  {Add pertinent medical, surgical, social history, OB history to HPI:32947}  Sickle Cell Pain Crisis Leg Pain    23 year old male with medical history significant for sickle cell anemia presenting to the emergency department with right leg pain.  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  He ran out of his home oxycodone  and try to get in with the infusion center today but was unable to.  Not missed doses of medication.  He endorses pain in his right leg, denies any swelling, denies any history of DVT or PE.  Pain is consistent with his known sickle cell pain.  He denies any chest pain or shortness of breath.  No fevers or chills.  Prior to Admission medications   Medication Sig Start Date End Date Taking? Authorizing Provider  acetaminophen  (TYLENOL ) 500 MG tablet Take 500-1,000 mg by mouth every 6 (six) hours as needed (for pain). Patient not taking: Reported on 07/04/2024    [provider]  cyclobenzaprine  (FLEXERIL ) 5 MG tablet Take 1 tablet (5 mg total) by mouth 3 (three) times daily as needed for muscle spasms. Patient not taking: Reported on 07/04/2024 02/17/24   Paseda, Folashade R, FNP  folic acid  (FOLVITE ) 1 MG tablet Take 1 tablet (1 mg total) by mouth daily. 07/04/24   Paseda, Folashade R, FNP  hydroxyurea  (HYDREA ) 500 MG capsule Take 3 capsules (1,500 mg total) by mouth daily. May take with food to minimize GI side effects. 07/04/24   Paseda, Folashade R, FNP  ibuprofen  (ADVIL ) 800 MG tablet Take 1 tablet (800 mg total) by mouth every 8 (eight) hours as needed. 06/30/24   Ijaola, Onyeje M, NP  naloxone  (NARCAN ) nasal spray 4 mg/0.1  mL Use 1 nasal spray as a single dose in one nostril; may repeat with a new nasal spray every 2 to 3 minutes in alternating nostrils if needed for oopoidoverdose Dispense: 1 each, Refills: 1 ordered 11/17/23   Paseda, Folashade R, FNP  Oxycodone  HCl 10 MG TABS Take 1 tablet (10 mg total) by mouth every 6 (six) hours as needed. 07/08/24   Paseda, Folashade R, FNP  Vitamin D , Ergocalciferol , (DRISDOL ) 1.25 MG (50000 UNIT) CAPS capsule Take 1 capsule (50,000 Units total) by mouth every 7 (seven) days. 07/04/24   Paseda, Folashade R, FNP    Allergies: Allergy medication [diphenhydramine ]    Review of Systems  All other systems reviewed and are negative.   Updated Vital Signs BP 139/78 (BP Location: Left Arm)   Pulse 78   Temp 98.1 F (36.7 C) (Oral)   Resp 16   SpO2 98%   Physical Exam Vitals and nursing note reviewed.  Constitutional:      General: He is not in acute distress.    Appearance: He is well-developed.  HENT:     Head: Normocephalic and atraumatic.  Eyes:     Conjunctiva/sclera: Conjunctivae normal.  Cardiovascular:     Rate and Rhythm: Normal rate and regular rhythm.  Pulmonary:     Effort: Pulmonary effort is normal. No respiratory distress.     Breath sounds: Normal breath sounds.  Abdominal:  Palpations: Abdomen is soft.     Tenderness: There is no abdominal tenderness.  Musculoskeletal:        General: No swelling.     Cervical back: Neck supple.  Skin:    General: Skin is warm and dry.     Capillary Refill: Capillary refill takes less than 2 seconds.  Neurological:     Mental Status: He is alert.  Psychiatric:        Mood and Affect: Mood normal.     (all labs ordered are listed, but only abnormal results are displayed) Labs Reviewed  COMPREHENSIVE METABOLIC PANEL WITH GFR  CBC WITH DIFFERENTIAL/PLATELET  RETICULOCYTES    EKG: None  Radiology: No results found.  {Document cardiac monitor, telemetry assessment procedure when  appropriate:32947} Procedures   Medications Ordered in the ED  HYDROmorphone  (DILAUDID ) injection 1 mg (has no administration in time range)      {Click here for ABCD2, HEART and other calculators REFRESH Note before signing:1}                              Medical Decision Making Amount and/or Complexity of Data Reviewed Labs: ordered.  Risk Prescription drug management.    23 year old male with medical history significant for sickle cell anemia presenting to the emergency department with right leg pain.  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  He ran out of his home oxycodone  and try to get in with the infusion center today but was unable to.  Not missed doses of medication.  He endorses pain in his right leg, denies any swelling, denies any history of DVT or PE.  Pain is consistent with his known sickle cell pain.  He denies any chest pain or shortness of breath.  No fevers or chills.  ***  {Document critical care time when appropriate  Document review of labs and clinical decision tools ie CHADS2VASC2, etc  Document your independent review of radiology images and any outside records  Document your discussion with family members, caretakers and with consultants  Document social determinants of health affecting pt's care  Document your decision making why or why not admission, treatments were needed:32947:::1}   Final diagnoses:  None    ED Discharge Orders     None

## 2024-08-02 NOTE — Telephone Encounter (Signed)
 Called [facility], spoke with Will, no available appts [facility], refer to ED.

## 2024-09-05 ENCOUNTER — Other Ambulatory Visit (HOSPITAL_COMMUNITY): Payer: Self-pay

## 2024-09-05 ENCOUNTER — Other Ambulatory Visit: Payer: Self-pay

## 2024-09-05 ENCOUNTER — Telehealth: Payer: Self-pay | Admitting: Nurse Practitioner

## 2024-09-05 ENCOUNTER — Telehealth (HOSPITAL_COMMUNITY): Payer: Self-pay | Admitting: *Deleted

## 2024-09-05 ENCOUNTER — Non-Acute Institutional Stay (HOSPITAL_COMMUNITY)
Admission: AD | Admit: 2024-09-05 | Discharge: 2024-09-05 | Disposition: A | Discharge status: Home / Self Care | Source: Ambulatory Visit | Attending: Internal Medicine | Admitting: Internal Medicine

## 2024-09-05 DIAGNOSIS — D5700 Hb-SS disease with crisis, unspecified: Secondary | ICD-10-CM

## 2024-09-05 DIAGNOSIS — F1120 Opioid dependence, uncomplicated: Secondary | ICD-10-CM | POA: Diagnosis not present

## 2024-09-05 DIAGNOSIS — D638 Anemia in other chronic diseases classified elsewhere: Secondary | ICD-10-CM | POA: Insufficient documentation

## 2024-09-05 DIAGNOSIS — D571 Sickle-cell disease without crisis: Secondary | ICD-10-CM

## 2024-09-05 DIAGNOSIS — G894 Chronic pain syndrome: Secondary | ICD-10-CM | POA: Diagnosis not present

## 2024-09-05 DIAGNOSIS — Z79891 Long term (current) use of opiate analgesic: Secondary | ICD-10-CM

## 2024-09-05 LAB — COMPREHENSIVE METABOLIC PANEL WITH GFR
ALT: 12 U/L (ref 0–44)
AST: 30 U/L (ref 15–41)
Albumin: 4.5 g/dL (ref 3.5–5.0)
Alkaline Phosphatase: 146 U/L — ABNORMAL HIGH (ref 38–126)
Anion gap: 12 (ref 5–15)
BUN: 7 mg/dL (ref 6–20)
CO2: 22 mmol/L (ref 22–32)
Calcium: 9.7 mg/dL (ref 8.9–10.3)
Chloride: 106 mmol/L (ref 98–111)
Creatinine, Ser: 0.8 mg/dL (ref 0.61–1.24)
GFR, Estimated: >60 mL/min
Glucose, Bld: 98 mg/dL (ref 70–99)
Potassium: 4.1 mmol/L (ref 3.5–5.1)
Sodium: 139 mmol/L (ref 135–145)
Total Bilirubin: 1.1 mg/dL (ref 0.0–1.2)
Total Protein: 7.6 g/dL (ref 6.5–8.1)

## 2024-09-05 LAB — CBC WITH DIFFERENTIAL/PLATELET
Abs Immature Granulocytes: 0.05 K/uL (ref 0.00–0.07)
Basophils Absolute: 0.1 K/uL (ref 0.0–0.1)
Basophils Relative: 1 %
Eosinophils Absolute: 0.3 K/uL (ref 0.0–0.5)
Eosinophils Relative: 4 %
HCT: 31.4 % — ABNORMAL LOW (ref 39.0–52.0)
Hemoglobin: 10.8 g/dL — ABNORMAL LOW (ref 13.0–17.0)
Immature Granulocytes: 1 %
Lymphocytes Relative: 26 %
Lymphs Abs: 2.3 K/uL (ref 0.7–4.0)
MCH: 31.6 pg (ref 26.0–34.0)
MCHC: 34.4 g/dL (ref 30.0–36.0)
MCV: 91.8 fL (ref 80.0–100.0)
Monocytes Absolute: 0.8 K/uL (ref 0.1–1.0)
Monocytes Relative: 9 %
Neutro Abs: 5.4 K/uL (ref 1.7–7.7)
Neutrophils Relative %: 59 %
Platelets: 409 K/uL — ABNORMAL HIGH (ref 150–400)
RBC: 3.42 MIL/uL — ABNORMAL LOW (ref 4.22–5.81)
RDW: 20.2 % — ABNORMAL HIGH (ref 11.5–15.5)
WBC: 9 K/uL (ref 4.0–10.5)
nRBC: 2.2 % — ABNORMAL HIGH (ref 0.0–0.2)

## 2024-09-05 LAB — RETICULOCYTES
Immature Retic Fract: 48.7 % — ABNORMAL HIGH (ref 2.3–15.9)
RBC.: 3.32 MIL/uL — ABNORMAL LOW (ref 4.22–5.81)
Retic Count, Absolute: 245 K/uL — ABNORMAL HIGH (ref 19.0–186.0)
Retic Ct Pct: 7.4 % — ABNORMAL HIGH (ref 0.4–3.1)

## 2024-09-05 LAB — LACTATE DEHYDROGENASE: LDH: 255 U/L — ABNORMAL HIGH (ref 105–235)

## 2024-09-05 MED ORDER — ONDANSETRON HCL 4 MG/2ML IJ SOLN
4.0000 mg | Freq: Four times a day (QID) | INTRAMUSCULAR | Status: DC | PRN
  Administered 2024-09-05: 4 mg via INTRAVENOUS
  Filled 2024-09-05: qty 2

## 2024-09-05 MED ORDER — SENNOSIDES-DOCUSATE SODIUM 8.6-50 MG PO TABS: 1.0000 | ORAL_TABLET | Freq: Two times a day (BID) | ORAL | Status: DC

## 2024-09-05 MED ORDER — HYDROXYUREA 500 MG PO CAPS
1500.0000 mg | ORAL_CAPSULE | Freq: Every day | ORAL | 3 refills | Status: DC
  Filled 2024-09-05: qty 90, 30d supply, fill #0

## 2024-09-05 MED ORDER — SODIUM CHLORIDE 0.45 % IV SOLN: INTRAVENOUS | Status: DC

## 2024-09-05 MED ORDER — SODIUM CHLORIDE 0.9% FLUSH: 9.0000 mL | INTRAVENOUS | Status: DC | PRN

## 2024-09-05 MED ORDER — HYDROMORPHONE 1 MG/ML IV SOLN
INTRAVENOUS | Status: DC
  Administered 2024-09-05: 6 mg via INTRAVENOUS
  Administered 2024-09-05: 30 mg via INTRAVENOUS
  Filled 2024-09-05: qty 30

## 2024-09-05 MED ORDER — IBUPROFEN 800 MG PO TABS
800.0000 mg | ORAL_TABLET | Freq: Three times a day (TID) | ORAL | 4 refills | Status: AC | PRN
  Filled 2024-09-05: qty 30, 10d supply, fill #0

## 2024-09-05 MED ORDER — OXYCODONE HCL 10 MG PO TABS
10.0000 mg | ORAL_TABLET | Freq: Four times a day (QID) | ORAL | 0 refills | Status: DC | PRN
  Filled 2024-09-05: qty 60, 15d supply, fill #0

## 2024-09-05 MED ORDER — KETOROLAC TROMETHAMINE 15 MG/ML IJ SOLN
15.0000 mg | Freq: Once | INTRAMUSCULAR | Status: AC
  Administered 2024-09-05: 15 mg via INTRAVENOUS
  Filled 2024-09-05: qty 1

## 2024-09-05 MED ORDER — NALOXONE HCL 0.4 MG/ML IJ SOLN: 0.4000 mg | INTRAMUSCULAR | Status: DC | PRN

## 2024-09-05 MED ORDER — DIPHENHYDRAMINE HCL 25 MG PO CAPS: 25.0000 mg | ORAL_CAPSULE | ORAL | Status: DC | PRN

## 2024-09-05 MED ORDER — POLYETHYLENE GLYCOL 3350 17 G PO PACK: 17.0000 g | PACK | Freq: Every day | ORAL | Status: DC | PRN

## 2024-09-05 NOTE — Telephone Encounter (Signed)
 Patient called requesting to come to the day hospital for sickle cell pain. Reports back pain rated 7/10. Reports taking Oxycodone  at 4:00 am and Ibuprofen  at 2:00 am. Denies fever, chest pain, nausea, vomiting, diarrhea, abdominal pain and priapism. Admits to having transportation without driving self. Dr. Jegede notified and advised that patient can come to the day hospital. Patient advised and expresses an understanding.

## 2024-09-05 NOTE — Progress Notes (Signed)
 Diagnosis: Sickle cell crisis    Provider: Jegede, O    Procedure:  Dilaudid  PCA pump    Note:  patient received Dilaudid  PCA pump 0.5/10/3 with a 2 mg bolus dose per orders.  Patient had complaints of back pain 7/10 upon admission to day hospital and at discharge his pain was 2/10.  Patient was hydrated with .45% NS at 125 ml/hr and given IV Zofran  for nausea that was caused by Dilaudid  and given Toradol  IVP as ordered .  Patient ambulatory at discharge, advised to stop at front desk and request medication refills, states he has enough for three more days at this time.  Patient declined AVS, uses my chart and Dr Morrell wrote an out of work note as well , patient is to return to normal duties on 09/07/2024 Wednesday.  Patient ambulatory, alert and oriented at discharge and vital signs stable

## 2024-09-05 NOTE — Telephone Encounter (Signed)
 ibuprofen  (ADVIL ) 800 MG tablet    Oxycodone  HCl 10 MG TABS    hydroxyurea  (HYDREA ) 500 MG capsule

## 2024-09-05 NOTE — Discharge Summary (Signed)
 Physician Discharge Summary  Jonathan [name] FMW:[id number] DOB: April 13, 2001 DOA: 09/05/2024  PCP: Paseda, Folashade R, FNP  Admit date: 09/05/2024  Discharge date: 09/05/2024  Time spent: 30 minutes  Discharge Diagnoses:  Principal Problem:   Sickle cell anemia with pain [facility])   Discharge Condition: Stable  Diet recommendation: Regular  History of present illness:  Jonathan [name] is a 24 y.o. male with history of sickle cell disease, chronic pain syndrome, opiate dependence and tolerance, and anemia of chronic disease who presented to the day hospital today with major complaints of generalized body pain was most significant in his lower extremities and lower back, these are consistent with his typical sickle cell pain crisis.  He rated his pain at about 8/10 this morning, characterized as throbbing and achy.  He took his home pain medications with no sustained relief.  He denies any fever, cough, chest pain, shortness of breath, nausea, vomiting or diarrhea.  No urinary symptoms.  He denies any recent travels, sick contact, or known exposure to flu or COVID-19.  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  Hospital Course:  Jonathan [name] was admitted to the day hospital with sickle cell painful crisis. Patient was treated with IV fluid, weight based IV Dilaudid  PCA, clinician assisted doses as deemed appropriate, and other adjunct therapies per sickle cell pain management protocol. Jonathan [name] showed improvement symptomatically, pain improved from 8/10 to 2/10 at the time of discharge. Patient was discharged home in a hemodynamically stable condition. Jonathan [name] will follow-up at the clinic as previously scheduled, continue with home medications as per prior to admission.  Discharge Instructions We discussed the need for good hydration, monitoring of hydration status, avoidance of heat, cold, stress, and infection triggers. We discussed the need to be compliant with taking Hydrea  and other home  medications. Jonathan [name] was reminded of the need to seek medical attention immediately if any symptom of bleeding, anemia, or infection occurs.  Discharge Exam: Vitals:   09/05/24 1155 09/05/24 1332  BP: 121/68 119/65  Pulse: 89 82  Resp: 14 16  Temp: 98.6 F (37 C) 98.5 F (36.9 C)  SpO2:      General appearance: alert, cooperative and no distress Eyes: conjunctivae/corneas clear. PERRL, EOM's intact. Fundi benign. Neck: no adenopathy, no carotid bruit, no JVD, supple, symmetrical, trachea midline and thyroid not enlarged, symmetric, no tenderness/mass/nodules Back: symmetric, no curvature. ROM normal. No CVA tenderness. Resp: clear to auscultation bilaterally Chest wall: no tenderness Cardio: regular rate and rhythm, S1, S2 normal, no murmur, click, rub or gallop GI: soft, non-tender; bowel sounds normal; no masses,  no organomegaly Extremities: extremities normal, atraumatic, no cyanosis or edema Pulses: 2+ and symmetric Skin: Skin color, texture, turgor normal. No rashes or lesions Neurologic: Grossly normal  Discharge Instructions     Increase activity slowly   Complete by: As directed       Allergies as of 09/05/2024       Reactions   Allergy Medication [diphenhydramine ] Swelling, Other (See Comments)   Face swells        Medication List     TAKE these medications    acetaminophen  500 MG tablet Commonly known as: TYLENOL  Take 500-1,000 mg by mouth every 6 (six) hours as needed (for pain).   cyclobenzaprine  5 MG tablet Commonly known as: FLEXERIL  Take 1 tablet (5 mg total) by mouth 3 (three) times daily as needed for muscle spasms.   folic acid  1 MG tablet Commonly known as: FOLVITE  Take 1  tablet (1 mg total) by mouth daily.   hydroxyurea  500 MG capsule Commonly known as: HYDREA  Take 3 capsules (1,500 mg total) by mouth daily. May take with food to minimize GI side effects.   ibuprofen  800 MG tablet Commonly known as: ADVIL  Take 1 tablet (800 mg  total) by mouth every 8 (eight) hours as needed.   naloxone  4 MG/0.1ML Liqd nasal spray kit Commonly known as: NARCAN  Use 1 nasal spray as a single dose in one nostril; may repeat with a new nasal spray every 2 to 3 minutes in alternating nostrils if needed for oopoidoverdose Dispense: 1 each, Refills: 1 ordered   Oxycodone  HCl 10 MG Tabs Take 1 tablet (10 mg total) by mouth every 6 (six) hours as needed.   Vitamin D  (Ergocalciferol ) 1.25 MG (50000 UNIT) Caps capsule Commonly known as: DRISDOL  Take 1 capsule (50,000 Units total) by mouth every 7 (seven) days.       Allergies[1]   Significant Diagnostic Studies: No results found.  Signed:  Eveny [name]  09/05/2024, 1:36 PM     [1]  Allergies Allergen Reactions   Allergy Medication [Diphenhydramine ] Swelling and Other (See Comments)    Face swells

## 2024-09-05 NOTE — H&P (Signed)
 [facility] History and Physical  Jonathan [name] FMW:[id number] DOB: 2000-10-13 DOA: 09/05/2024  PCP: Juanice Thomes SAUNDERS, FNP   Chief Complaint: Sickle cell pain   HPI: Jonathan [name] is a 24 y.o. male with history of sickle cell disease, chronic pain syndrome, opiate dependence and tolerance, and anemia of chronic disease who presented to the day hospital today with major complaints of generalized body pain was most significant in his lower extremities and lower back, these are consistent with his typical sickle cell pain crisis.  He rated his pain at about 8/10 this morning, characterized as throbbing and achy.  He took his home pain medications with no sustained relief.  He denies any fever, cough, chest pain, shortness of breath, nausea, vomiting or diarrhea.  No urinary symptoms.  He denies any recent travels, sick contact, or known exposure to flu or COVID-19.  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  Systemic Review: General: The patient denies anorexia, fever, weight loss Cardiac: Denies chest pain, syncope, palpitations, pedal edema  Respiratory: Denies cough, shortness of breath, wheezing GI: Denies severe indigestion/heartburn, abdominal pain, nausea, vomiting, diarrhea and constipation GU: Denies hematuria, incontinence, dysuria  Musculoskeletal: Denies arthritis  Skin: Denies suspicious skin lesions Neurologic: Denies focal weakness or numbness, change in vision  Past Medical History:  Diagnosis Date   Acute chest syndrome(517.3) 08/26/2011   march 2013, Sept 2013   Asthma    Avascular necrosis of bone of left hip (HCC)    Marijuana smoker 02/17/2024   Sickle cell disease, type SS (HCC)    Vitamin D  deficiency 11/17/2023    Past Surgical History:  Procedure Laterality Date   ADENOIDECTOMY     SPLENECTOMY     TONSILLECTOMY     TONSILLECTOMY AND ADENOIDECTOMY     TOTAL HIP ARTHROPLASTY Left 03/07/2016   UMBILICAL HERNIA REPAIR       Allergies[1]  Family History  Problem Relation Age of Onset   Hypertension Mother    Hypertension Maternal Grandmother    Hypertension Maternal Grandfather    Asthma Paternal Grandfather    Diabetes Paternal Grandfather       Prior to Admission medications  Medication Sig Start Date End Date Taking? Authorizing Provider  acetaminophen  (TYLENOL ) 500 MG tablet Take 500-1,000 mg by mouth every 6 (six) hours as needed (for pain). Patient not taking: Reported on 07/04/2024    [provider]  cyclobenzaprine  (FLEXERIL ) 5 MG tablet Take 1 tablet (5 mg total) by mouth 3 (three) times daily as needed for muscle spasms. Patient not taking: Reported on 07/04/2024 02/17/24   Paseda, Folashade R, FNP  folic acid  (FOLVITE ) 1 MG tablet Take 1 tablet (1 mg total) by mouth daily. 07/04/24   Paseda, Folashade R, FNP  hydroxyurea  (HYDREA ) 500 MG capsule Take 3 capsules (1,500 mg total) by mouth daily. May take with food to minimize GI side effects. 07/04/24   Paseda, Folashade R, FNP  ibuprofen  (ADVIL ) 800 MG tablet Take 1 tablet (800 mg total) by mouth every 8 (eight) hours as needed. 06/30/24   Ijaola, Onyeje M, NP  naloxone  (NARCAN ) nasal spray 4 mg/0.1 mL Use 1 nasal spray as a single dose in one nostril; may repeat with a new nasal spray every 2 to 3 minutes in alternating nostrils if needed for oopoidoverdose Dispense: 1 each, Refills: 1 ordered 11/17/23   Paseda, Folashade R, FNP  Oxycodone  HCl 10 MG TABS Take 1 tablet (10 mg total) by mouth every 6 (  six) hours as needed. 07/08/24   Paseda, Folashade R, FNP  Vitamin D , Ergocalciferol , (DRISDOL ) 1.25 MG (50000 UNIT) CAPS capsule Take 1 capsule (50,000 Units total) by mouth every 7 (seven) days. 07/04/24   Paseda, Folashade R, FNP     Physical Exam: There were no vitals filed for this visit.  General: Alert, awake, afebrile, anicteric, not in obvious distress HEENT: Normocephalic and Atraumatic, Mucous membranes pink                 PERRLA; EOM intact; No scleral icterus,                 Nares: Patent, Oropharynx: Clear, Fair Dentition                 Neck: FROM, no cervical lymphadenopathy, thyromegaly, carotid bruit or JVD;  CHEST WALL: No tenderness  CHEST: Normal respiration, clear to auscultation bilaterally  HEART: Regular rate and rhythm; no murmurs rubs or gallops  BACK: No kyphosis or scoliosis; no CVA tenderness  ABDOMEN: Positive Bowel Sounds, soft, non-tender; no masses, no organomegaly EXTREMITIES: No cyanosis, clubbing, or edema SKIN:  no rash or ulceration  CNS: Alert and Oriented x 4, Nonfocal exam, CN 2-12 intact  Labs on Admission:  Basic Metabolic Panel: No results for input(s): NA, K, CL, CO2, GLUCOSE, BUN, CREATININE, CALCIUM, MG, PHOS in the last 168 hours. Liver Function Tests: No results for input(s): AST, ALT, ALKPHOS, BILITOT, PROT, ALBUMIN in the last 168 hours. No results for input(s): LIPASE, AMYLASE in the last 168 hours. No results for input(s): AMMONIA in the last 168 hours. CBC: No results for input(s): WBC, NEUTROABS, HGB, HCT, MCV, PLT in the last 168 hours. Cardiac Enzymes: No results for input(s): CKTOTAL, CKMB, CKMBINDEX, TROPONINI in the last 168 hours.  BNP (last 3 results) No results for input(s): BNP in the last 8760 hours.  ProBNP (last 3 results) No results for input(s): PROBNP in the last 8760 hours.  CBG: No results for input(s): GLUCAP in the last 168 hours.   Assessment/Plan Principal Problem:   Sickle cell anemia with pain (HCC)  Admits to the Day Hospital for extended observation IVF 0.45% Saline @ 125 mls/hour Weight based Dilaudid  PCA started within 30 minutes of admission Acetaminophen  1000 mg x 1 dose Labs: CBCD, CMP, Retic Count and LDH Monitor vitals very closely, Re-evaluate pain scale every hour 2 L of Oxygen  by [place] Patient will be re-evaluated for pain in the context of function  and relationship to baseline as care progresses. If no significant relieve from pain (remains above 5/10) will transfer patient to inpatient services for further evaluation and management  Code Status: Full  Family Communication: None  DVT Prophylaxis: Ambulate as tolerated   Time spent: 35 Minutes  Jonathan [name]  If 7PM-7AM, please contact night-coverage [email or web address] 09/05/2024, 9:23 AM     [1]  Allergies Allergen Reactions   Allergy Medication [Diphenhydramine ] Swelling and Other (See Comments)    Face swells

## 2024-09-05 NOTE — Telephone Encounter (Signed)
 Sent to pcp [facility]

## 2024-09-05 NOTE — Telephone Encounter (Signed)
 Please advise [facility]

## 2024-09-06 ENCOUNTER — Other Ambulatory Visit: Payer: Self-pay

## 2024-09-06 ENCOUNTER — Other Ambulatory Visit (HOSPITAL_COMMUNITY): Payer: Self-pay

## 2024-09-06 LAB — ANA W/REFLEX IF POSITIVE: Anti Nuclear Antibody (ANA): NEGATIVE

## 2024-09-19 ENCOUNTER — Other Ambulatory Visit (HOSPITAL_COMMUNITY): Payer: Self-pay

## 2024-09-20 ENCOUNTER — Other Ambulatory Visit (HOSPITAL_COMMUNITY): Payer: Self-pay

## 2024-09-20 ENCOUNTER — Other Ambulatory Visit: Payer: Self-pay

## 2024-09-20 DIAGNOSIS — D571 Sickle-cell disease without crisis: Secondary | ICD-10-CM

## 2024-09-20 DIAGNOSIS — Z79891 Long term (current) use of opiate analgesic: Secondary | ICD-10-CM

## 2024-09-20 MED ORDER — HYDROXYUREA 500 MG PO CAPS
1500.0000 mg | ORAL_CAPSULE | Freq: Every day | ORAL | 3 refills | Status: AC
  Filled 2024-09-20: qty 90, 30d supply, fill #0

## 2024-09-20 MED ORDER — OXYCODONE HCL 10 MG PO TABS
10.0000 mg | ORAL_TABLET | Freq: Four times a day (QID) | ORAL | 0 refills | Status: AC | PRN
  Filled 2024-09-21: qty 60, 15d supply, fill #0

## 2024-09-20 NOTE — Telephone Encounter (Signed)
 Copied from CRM #[id number]. Topic: Clinical - Medication Refill >> Sep 20, 2024 11:40 AM Antwanette L wrote: Medication: Oxycodone  HCl 10 MG TABS  hydroxyurea  (HYDREA ) 500 MG capsule    Has the patient contacted their pharmacy? No   This is the patient's preferred pharmacy:  [facility] [address] [place] [address] Phone: [phone] Fax: [phone]  Is this the correct pharmacy for this prescription? Yes   Has the prescription been filled recently? Yes. Last refill was on 09/05/24  Is the patient out of the medication? No. Patient has 4 days worth of medicine   Has the patient been seen for an appointment in the last year OR does the patient have an upcoming appointment? Yes.Last office visit (hospital f/u) was on 07/04/24 and next appt is 10/11/24  Can we respond through MyChart? Yes and by phone at [phone]  Agent: Please be advised that Rx refills may take up to 3 business days. We ask that you follow-up with your pharmacy.  Please Advise.  CB.

## 2024-09-21 ENCOUNTER — Other Ambulatory Visit (HOSPITAL_COMMUNITY): Payer: Self-pay

## 2024-09-23 ENCOUNTER — Other Ambulatory Visit (HOSPITAL_COMMUNITY): Payer: Self-pay

## 2024-10-11 ENCOUNTER — Encounter: Payer: Self-pay | Admitting: Nurse Practitioner
